# Patient Record
Sex: Male | Born: 1965
Health system: Southern US, Community
[De-identification: ages and names within clinical notes are randomized; demographics above are authoritative.]

## PROBLEM LIST (undated history)

## (undated) DIAGNOSIS — I10 Essential (primary) hypertension: Secondary | ICD-10-CM

## (undated) DIAGNOSIS — I2510 Atherosclerotic heart disease of native coronary artery without angina pectoris: Secondary | ICD-10-CM

## (undated) DIAGNOSIS — E785 Hyperlipidemia, unspecified: Secondary | ICD-10-CM

## (undated) DIAGNOSIS — G4730 Sleep apnea, unspecified: Secondary | ICD-10-CM

## (undated) DIAGNOSIS — E119 Type 2 diabetes mellitus without complications: Secondary | ICD-10-CM

## (undated) HISTORY — DX: Essential (primary) hypertension: I10

## (undated) HISTORY — PX: CARDIAC CATHETERIZATION: SHX172

## (undated) HISTORY — DX: Type 2 diabetes mellitus without complications: E11.9

---

## 1999-11-29 ENCOUNTER — Ambulatory Visit: Admission: RE | Admit: 1999-11-29 | Discharge: 1999-11-29 | Payer: Self-pay | Admitting: *Deleted

## 2000-05-29 ENCOUNTER — Ambulatory Visit (HOSPITAL_BASED_OUTPATIENT_CLINIC_OR_DEPARTMENT_OTHER): Admission: RE | Admit: 2000-05-29 | Discharge: 2000-05-29 | Payer: Self-pay | Admitting: Pulmonary Disease

## 2013-01-05 ENCOUNTER — Encounter (HOSPITAL_COMMUNITY): Payer: Self-pay | Admitting: *Deleted

## 2013-01-05 ENCOUNTER — Emergency Department (INDEPENDENT_AMBULATORY_CARE_PROVIDER_SITE_OTHER)
Admission: EM | Admit: 2013-01-05 | Discharge: 2013-01-05 | Disposition: A | Discharge status: Home / Self Care | Payer: Self-pay | Source: Home / Self Care | Attending: Emergency Medicine | Admitting: Emergency Medicine

## 2013-01-05 ENCOUNTER — Emergency Department (INDEPENDENT_AMBULATORY_CARE_PROVIDER_SITE_OTHER): Payer: Self-pay

## 2013-01-05 DIAGNOSIS — M6530 Trigger finger, unspecified finger: Secondary | ICD-10-CM

## 2013-01-05 DIAGNOSIS — M65311 Trigger thumb, right thumb: Secondary | ICD-10-CM

## 2013-01-05 MED ORDER — METHOCARBAMOL 500 MG PO TABS: 500.0000 mg | ORAL_TABLET | Freq: Two times a day (BID) | ORAL | Status: DC

## 2013-01-05 MED ORDER — IBUPROFEN 800 MG PO TABS: 800.0000 mg | ORAL_TABLET | Freq: Three times a day (TID) | ORAL | Status: DC

## 2013-01-05 NOTE — ED Provider Notes (Signed)
Medical screening examination/treatment/procedure(s) were performed by non-physician practitioner and as supervising physician I was immediately available for consultation/collaboration.  Emanuelle Bastos   Sumiko [name], MD 01/05/13 1225 
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
## 2013-01-05 NOTE — ED Notes (Signed)
r thumb  Splint in pof
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
## 2013-03-15 ENCOUNTER — Emergency Department (HOSPITAL_BASED_OUTPATIENT_CLINIC_OR_DEPARTMENT_OTHER): Payer: Self-pay

## 2013-03-15 ENCOUNTER — Inpatient Hospital Stay (HOSPITAL_BASED_OUTPATIENT_CLINIC_OR_DEPARTMENT_OTHER)
Admission: EM | Admit: 2013-03-15 | Discharge: 2013-03-25 | DRG: 637 | Disposition: A | Discharge status: Home / Self Care | Payer: MEDICAID | Attending: Internal Medicine | Admitting: Internal Medicine

## 2013-03-15 ENCOUNTER — Encounter (HOSPITAL_BASED_OUTPATIENT_CLINIC_OR_DEPARTMENT_OTHER): Payer: Self-pay | Admitting: *Deleted

## 2013-03-15 DIAGNOSIS — R197 Diarrhea, unspecified: Secondary | ICD-10-CM | POA: Diagnosis present

## 2013-03-15 DIAGNOSIS — E1110 Type 2 diabetes mellitus with ketoacidosis without coma: Secondary | ICD-10-CM | POA: Diagnosis present

## 2013-03-15 DIAGNOSIS — R7309 Other abnormal glucose: Secondary | ICD-10-CM

## 2013-03-15 DIAGNOSIS — B372 Candidiasis of skin and nail: Secondary | ICD-10-CM | POA: Diagnosis present

## 2013-03-15 DIAGNOSIS — K047 Periapical abscess without sinus: Secondary | ICD-10-CM | POA: Diagnosis present

## 2013-03-15 DIAGNOSIS — G4733 Obstructive sleep apnea (adult) (pediatric): Secondary | ICD-10-CM | POA: Diagnosis present

## 2013-03-15 DIAGNOSIS — E46 Unspecified protein-calorie malnutrition: Secondary | ICD-10-CM | POA: Diagnosis present

## 2013-03-15 DIAGNOSIS — H538 Other visual disturbances: Secondary | ICD-10-CM | POA: Diagnosis present

## 2013-03-15 DIAGNOSIS — J189 Pneumonia, unspecified organism: Secondary | ICD-10-CM | POA: Diagnosis present

## 2013-03-15 DIAGNOSIS — R739 Hyperglycemia, unspecified: Secondary | ICD-10-CM

## 2013-03-15 DIAGNOSIS — E1010 Type 1 diabetes mellitus with ketoacidosis without coma: Principal | ICD-10-CM | POA: Diagnosis present

## 2013-03-15 DIAGNOSIS — R319 Hematuria, unspecified: Secondary | ICD-10-CM

## 2013-03-15 DIAGNOSIS — Z794 Long term (current) use of insulin: Secondary | ICD-10-CM

## 2013-03-15 DIAGNOSIS — G9340 Encephalopathy, unspecified: Secondary | ICD-10-CM | POA: Diagnosis present

## 2013-03-15 DIAGNOSIS — R310 Gross hematuria: Secondary | ICD-10-CM | POA: Diagnosis present

## 2013-03-15 DIAGNOSIS — E860 Dehydration: Secondary | ICD-10-CM | POA: Diagnosis present

## 2013-03-15 DIAGNOSIS — E119 Type 2 diabetes mellitus without complications: Secondary | ICD-10-CM

## 2013-03-15 DIAGNOSIS — E876 Hypokalemia: Secondary | ICD-10-CM

## 2013-03-15 HISTORY — DX: Sleep apnea, unspecified: G47.30

## 2013-03-15 LAB — BASIC METABOLIC PANEL
CO2: <7 mEq/L — CL (ref 19–32)
Chloride: 78 mEq/L — ABNORMAL LOW (ref 96–112)
GFR calc Af Amer: 75 mL/min — ABNORMAL LOW (ref >90)
Potassium: 4.6 mEq/L (ref 3.5–5.1)

## 2013-03-15 LAB — CBC WITH DIFFERENTIAL/PLATELET
Basophils Absolute: 0 10*3/uL (ref 0.0–0.1)
Basophils Relative: 0 % (ref 0–1)
Eosinophils Absolute: 0 10*3/uL (ref 0.0–0.7)
HCT: 44.7 % (ref 39.0–52.0)
Hemoglobin: 15.5 g/dL (ref 13.0–17.0)
Lymphocytes Relative: 6 % — ABNORMAL LOW (ref 12–46)
MCHC: 34.7 g/dL (ref 30.0–36.0)
Monocytes Relative: 9 % (ref 3–12)
Neutrophils Relative %: 85 % — ABNORMAL HIGH (ref 43–77)
RDW: 12.9 % (ref 11.5–15.5)
WBC: 27.8 10*3/uL — ABNORMAL HIGH (ref 4.0–10.5)

## 2013-03-15 LAB — POCT I-STAT 3, VENOUS BLOOD GAS (G3P V)
Acid-base deficit: 25 mmol/L — ABNORMAL HIGH (ref 0.0–2.0)
pCO2, Ven: 22.6 mmHg — ABNORMAL LOW (ref 45.0–50.0)
pH, Ven: 6.992 — CL (ref 7.250–7.300)
pO2, Ven: 25 mmHg — CL (ref 30.0–45.0)

## 2013-03-15 LAB — URINALYSIS, ROUTINE W REFLEX MICROSCOPIC
Glucose, UA: >1000 mg/dL — AB
Leukocytes, UA: NEGATIVE
Nitrite: NEGATIVE
Specific Gravity, Urine: 1.025 (ref 1.005–1.030)
pH: 5 (ref 5.0–8.0)

## 2013-03-15 LAB — URINE MICROSCOPIC-ADD ON

## 2013-03-15 LAB — GLUCOSE, CAPILLARY: Glucose-Capillary: >600 mg/dL (ref 70–99)

## 2013-03-15 MED ORDER — MORPHINE SULFATE 4 MG/ML IJ SOLN
4.0000 mg | Freq: Once | INTRAMUSCULAR | Status: AC
  Administered 2013-03-15: 4 mg via INTRAVENOUS

## 2013-03-15 MED ORDER — SODIUM CHLORIDE 0.9 % IV BOLUS (SEPSIS)
1000.0000 mL | Freq: Once | INTRAVENOUS | Status: AC
  Administered 2013-03-15: 1000 mL via INTRAVENOUS

## 2013-03-15 MED ORDER — MORPHINE SULFATE 4 MG/ML IJ SOLN
INTRAMUSCULAR | Status: AC
  Filled 2013-03-15: qty 1

## 2013-03-15 MED ORDER — SODIUM CHLORIDE 0.9 % IV SOLN: INTRAVENOUS | Status: DC

## 2013-03-15 MED ORDER — SODIUM CHLORIDE 0.9 % IV SOLN
1000.0000 mL | Freq: Once | INTRAVENOUS | Status: AC
  Administered 2013-03-15: 1000 mL via INTRAVENOUS

## 2013-03-15 MED ORDER — ONDANSETRON HCL 4 MG/2ML IJ SOLN
4.0000 mg | Freq: Once | INTRAMUSCULAR | Status: AC
  Administered 2013-03-15: 4 mg via INTRAVENOUS
  Filled 2013-03-15: qty 2

## 2013-03-15 MED ORDER — INSULIN REGULAR HUMAN 100 UNIT/ML IJ SOLN
INTRAMUSCULAR | Status: AC
  Filled 2013-03-15: qty 1

## 2013-03-15 MED ORDER — SODIUM CHLORIDE 0.9 % IV SOLN: 1000.0000 mL | INTRAVENOUS | Status: DC

## 2013-03-15 NOTE — ED Provider Notes (Signed)
History    CSN: [id number] Arrival date & time 03/15/13  1924  First MD Initiated Contact with Patient 03/15/13 1945     Chief Complaint  Patient presents with  . Shortness of Breath   (Consider location/radiation/quality/duration/timing/severity/associated sxs/prior Treatment) HPI Comments: 47 y.o. Male with PMHx of sleep apnea presents today complaining of SOB, discoloration to lower extremities bilaterally, confusion, and generalized weakness. Onset: s/p root canal procedure on Thursday. Pt was given Clinda and hydrocodone for pain. Pt states he didn't feel well taking it, found himself tired, weak, having difficulty concentrating. So he stopped taking it as of Sunday. Today pt awoke to find himself short of breath and went to the urgent care where he was given a breathing treatment and a steroid. Pt states it did not help. He went home, continued feeling short of breath so decided to come to the ED for evaluation.    CBG at bedside was > 600.  Patient is a 47 y.o. male presenting with shortness of breath.  Shortness of Breath Severity:  Severe Onset quality:  Gradual Timing:  Constant Progression:  Worsening Chronicity:  New Context comment:  S/p taking antibiotics and pain meds for root canal Relieved by:  Nothing Worsened by:  Nothing tried Ineffective treatments:  None tried Associated symptoms: no chest pain, no diaphoresis, no fever, no headaches, no neck pain, no sore throat, no syncope and no vomiting   Associated symptoms comment:  Mottling of bilateral lower extremities  Past Medical History  Diagnosis Date  . Sleep apnea    History reviewed. No pertinent past surgical history. History reviewed. No pertinent family history. History  Substance Use Topics  . Smoking status: Never Smoker   . Smokeless tobacco: Not on file  . Alcohol Use: Yes    Review of Systems  Constitutional: Positive for chills. Negative for fever and diaphoresis.  HENT: Positive for  dental problem. Negative for sore throat, facial swelling, drooling, trouble swallowing, neck pain, neck stiffness and voice change.   Eyes: Negative for photophobia and visual disturbance.  Respiratory: Positive for shortness of breath.   Cardiovascular: Negative for chest pain and syncope.  Gastrointestinal: Negative for nausea and vomiting.  Endocrine: Positive for polydipsia.  Musculoskeletal: Negative for back pain.  Skin: Positive for color change.       Mottling of bilateral lower extremities  Neurological: Negative for weakness, light-headedness, numbness and headaches.  Psychiatric/Behavioral: The patient is not nervous/anxious.     Allergies  Review of patient's allergies indicates no known allergies.  Home Medications   Current Outpatient Rx  Name  Route  Sig  Dispense  Refill  . ibuprofen (ADVIL,MOTRIN) 800 MG tablet   Oral   Take 1 tablet (800 mg total) by mouth 3 (three) times daily.   21 tablet   0   . methocarbamol (ROBAXIN) 500 MG tablet   Oral   Take 1 tablet (500 mg total) by mouth 2 (two) times daily.   20 tablet   0    BP 119/71  Pulse 75  Resp 16  Ht 5\' 8"  (1.727 m)  Wt 170 lb (77.111 kg)  BMI 25.85 kg/m2  SpO2 100% Physical Exam  Nursing note and vitals reviewed. Constitutional: He is oriented to person, place, and time. No distress.  Sick looking  HENT:  Head: Normocephalic and atraumatic.  Eyes: Conjunctivae and EOM are normal.  Neck: Normal range of motion. Neck supple.  No meningeal signs  Cardiovascular: Normal rate, regular rhythm, normal  heart sounds and intact distal pulses.  Exam reveals no gallop and no friction rub.   No murmur heard. Pulmonary/Chest: Breath sounds normal. No respiratory distress. He has no wheezes. He has no rales. He exhibits no tenderness.  Kussmaul respirations, tachypnic to low 20s  Abdominal: Soft. Bowel sounds are normal. He exhibits no distension. There is no tenderness. There is no rebound and no  guarding.  Musculoskeletal: Normal range of motion. He exhibits no edema and no tenderness.  FROM to upper and lower extremities  Neurological: He is alert and oriented to person, place, and time. No cranial nerve deficit.  Speech is clear and goal oriented, follows commands Sensation normal to light touch and two point discrimination Moves extremities without ataxia, coordination intact Normal gait and balance Normal strength in upper and lower extremities bilaterally including dorsiflexion and plantar flexion, strong and equal grip strength   Skin: Skin is dry. He is not diaphoretic. No erythema.  Cool to touch. Mottling on bilateral lower extremities.   Psychiatric:  anxious    ED Course  Procedures (including critical care time)   Date: 03/15/2013  Rate: 76  Rhythm: normal sinus rhythm  QRS Axis: normal  Intervals: normal  ST/T Wave abnormalities: normal  Conduction Disutrbances:low voltage  Narrative Interpretation: abnormal EKG  Old EKG Reviewed: none available   Labs Reviewed  GLUCOSE, CAPILLARY - Abnormal; Notable for the following:    Glucose-Capillary >600 (*)    All other components within normal limits  CBC WITH DIFFERENTIAL - Abnormal; Notable for the following:    WBC 27.8 (*)    Neutrophils Relative % 85 (*)    Lymphocytes Relative 6 (*)    Neutro Abs 23.6 (*)    Monocytes Absolute 2.5 (*)    All other components within normal limits  BASIC METABOLIC PANEL - Abnormal; Notable for the following:    Sodium 108 (*)    Chloride 78 (*)    CO2 <7 (*)    Glucose, Bld 1146 (*)    BUN 45 (*)    GFR calc non Af Amer 64 (*)    GFR calc Af Amer 75 (*)    All other components within normal limits  POCT I-STAT 3, BLOOD GAS (G3P V) - Abnormal; Notable for the following:    pH, Ven 6.992 (*)    pCO2, Ven 22.6 (*)    pO2, Ven 25.0 (*)    Bicarbonate 5.8 (*)    Acid-base deficit 25.0 (*)    All other components within normal limits  URINALYSIS, ROUTINE W REFLEX  MICROSCOPIC   No results found. 1. DKA (diabetic ketoacidoses)     MDM  Pt in severe DKA with new onset DM s/p dental work. Glucose 1146. PH 6.992. Bicarb 5.8. Sodium 108. Chloride 78. Anion Gap 24.2. WBC 27.8. BUN 45. Rectal temp 91.5, after warming blankets  93.4. Dr. Anitra Lauth notified immediately. Will arrange to have pt transported to ICU.   Glade Nurse, PA-C 03/15/13 2235  Glade Nurse, PA-C 03/15/13 [phone]

## 2013-03-15 NOTE — ED Notes (Signed)
Pt c/o SOB, rash to legs , weakness , seen at UC this am for same ? Allergic reaction to meds

## 2013-03-15 NOTE — H&P (Signed)
PULMONARY  / CRITICAL CARE MEDICINE  Name: Johnny Nichols MRN: [id number] DOB: 06/27/1966    ADMISSION DATE:  03/15/2013 CONSULTATION DATE:  03/15/2013  REFERRING MD :  EDP [facility] PRIMARY SERVICE:  PCCM  CHIEF COMPLAINT:  DKA  BRIEF PATIENT DESCRIPTION: 47 yo with past medical history of OSA brought to [facility] ED with complaints of weakness and confusion. Reports root canal procedure 5 days prior to presentation for after which he was prescribed Clindamycin and Oxycodone.  Antibiotics completed 2 days prior to presentation.  Today he presented to urgent care with dyspnea for which he was treated with bronchodilator and steroid and discharged.  In ED hyperglycemic, acidotic, hypothermic.  PCCM was consulted and patient was transferred to [facility] for further management.  SIGNIFICANT EVENTS / STUDIES:   LINES / TUBES:  CULTURES: 7/15 Blood >>> 7/14 Urine >>>  ANTIBIOTICS: Zosyn 7/15 >>>  The patient is encephalopathic and unable to provide history, which was obtained for available medical records.  HISTORY OF PRESENT ILLNESS:  47 yo with past medical history of OSA brought to [facility] ED with complaints of weakness and confusion. Reports root canal procedure 5 days prior to presentation for after which he was prescribed Clindamycin and Oxycodone.  Antibiotics completed 2 days prior to presentation.  Today he presented to urgent care with dyspnea for which he was treated with bronchodilator and steroid and discharged.  In ED hyperglycemic, acidotic, hypothermic.  PCCM was consulted and patient was transferred to [facility] for further management.  PAST MEDICAL HISTORY :  Past Medical History  Diagnosis Date  . Sleep apnea    History reviewed. No pertinent past surgical history. Prior to Admission medications   Medication Sig Start Date End Date Taking? Authorizing Provider  ibuprofen (ADVIL,MOTRIN) 800 MG tablet Take 1 tablet (800 mg total) by mouth 3 (three) times daily. 01/05/13   Elson Areas, PA-C   methocarbamol (ROBAXIN) 500 MG tablet Take 1 tablet (500 mg total) by mouth 2 (two) times daily. 01/05/13   Elson Areas, PA-C   No Known Allergies  FAMILY HISTORY:  History reviewed. No pertinent family history.  SOCIAL HISTORY:  reports that he has never smoked. He does not have any smokeless tobacco history on file. He reports that  drinks alcohol. His drug history is not on file.  REVIEW OF SYSTEMS:  Unable to provide.  INTERVAL HISTORY:  VITAL SIGNS: Temp:  [91.5 F (33.1 C)-94.6 F (34.8 C)] 94.6 F (34.8 C) (07/15 2355) Pulse Rate:  [75-97] 97 (07/16 0000) Resp:  [15-24] 19 (07/16 0000) BP: (119-145)/(71-98) 121/80 mmHg (07/16 0000) SpO2:  [98 %-100 %] 98 % (07/16 0000) Weight:  [77.111 kg (170 lb)-86.3 kg (190 lb 4.1 oz)] 86.3 kg (190 lb 4.1 oz) (07/15 2345)  HEMODYNAMICS:   VENTILATOR SETTINGS:   INTAKE / OUTPUT: Intake/Output     07/15 0701 - 07/16 0700   I.V. (mL/kg) 122.3 (1.4)   Total Intake(mL/kg) 122.3 (1.4)   Net +122.3         PHYSICAL EXAMINATION: General:  Appears acutely ill, no distress Neuro:  Encephalopathic, responds appropriately but slow, nonfocal HEENT:  PERRL, very dry oral mucosa, could not identify any focal oral lesions Cardiovascular:  RRR, no m/r/g Lungs:  Bilateral diminished air entry, no w/r/r Abdomen:  Soft, nontender, bowel sounds diminished Musculoskeletal:  Moves all extremities, no edema Skin:  Intact, no rash  LABS:  Recent Labs Lab 03/15/13 2023  HGB 15.5  WBC 27.8*  PLT 217  NA  108*  K 4.6  CL 78*  CO2 <7*  GLUCOSE 1146*  BUN 45*  CREATININE 1.30  CALCIUM 9.1    Recent Labs Lab 03/15/13 1945 03/15/13 2221  GLUCAP >600* >600*   CXR:  7/15 >>> nad  ASSESSMENT / PLAN:  PULMONARY A:  OSA. P:   Gaol SpO2>92 Supplemental oxygen PRN CPAP?   CARDIOVASCULAR A: Hemodynamically stable.  No arrhythmia / ischemia. P:  Goal MAP>60 Trend troponin / lactate  RENAL A:  Metabolic acidosis.  Pseudohyponatremia (corrected Na = 133).  Dehydration / hypovolemia. P:   Trend BMP IVF per DKA protocol  GASTROINTESTINAL A:  Abdominal pain / nausea, likely secondary to DKA P:   NPO GI Px is not indicated Amylase, lipase, LFTs  HEMATOLOGIC A:  Hemoconcentration. P:  Trend CBC Heparin for DVT Px  INFECTIOUS A:  No overt infection. Dental abscess?  Leukocytosis possible to DKA / steroids. P:   Cultures and antibiotics as above PCT Consider maxillofacial imaging in AM   ENDOCRINE  A:  New onset DM.  DKA is setting of infection? systemic steroids? P:   DKA protocol  NEUROLOGIC A:  Acute encephalopathy in setting of severe acidosis / hyperglycemia.  P:   Goal RASS 0 to -1 Drug screen Hold Robaxin  TODAY'S SUMMARY: New onset DM.  DKA / severe hyperglycemia in setting of acute infection (dental?) and systemic steroids.  DKA protocol. Empirical Zosyn. May need maxillofacial imaging in AM.   I have personally obtained a history, examined the patient, evaluated laboratory and imaging results, formulated the assessment and plan and placed orders.  CRITICAL CARE:  The patient is critically ill with multiple organ systems failure and requires high complexity decision making for assessment and support, frequent evaluation and titration of therapies, application of advanced monitoring technologies and extensive interpretation of multiple databases. Critical Care Time devoted to patient care services described in this note is 45 minutes.   Lonia Farber, MD Pulmonary and Critical Care Medicine [facility] Pager: [phone]  03/16/2013, 12:12 AM

## 2013-03-15 NOTE — ED Notes (Signed)
Report called to [facility] at [facility] ICU

## 2013-03-16 ENCOUNTER — Encounter (HOSPITAL_COMMUNITY): Payer: Self-pay | Admitting: *Deleted

## 2013-03-16 ENCOUNTER — Inpatient Hospital Stay (HOSPITAL_COMMUNITY): Payer: Self-pay

## 2013-03-16 DIAGNOSIS — R739 Hyperglycemia, unspecified: Secondary | ICD-10-CM | POA: Insufficient documentation

## 2013-03-16 DIAGNOSIS — E119 Type 2 diabetes mellitus without complications: Secondary | ICD-10-CM | POA: Diagnosis present

## 2013-03-16 DIAGNOSIS — G9340 Encephalopathy, unspecified: Secondary | ICD-10-CM | POA: Diagnosis present

## 2013-03-16 DIAGNOSIS — G4733 Obstructive sleep apnea (adult) (pediatric): Secondary | ICD-10-CM | POA: Diagnosis present

## 2013-03-16 DIAGNOSIS — E1110 Type 2 diabetes mellitus with ketoacidosis without coma: Secondary | ICD-10-CM | POA: Diagnosis present

## 2013-03-16 DIAGNOSIS — K047 Periapical abscess without sinus: Secondary | ICD-10-CM | POA: Diagnosis present

## 2013-03-16 LAB — GLUCOSE, CAPILLARY
Glucose-Capillary: 235 mg/dL — ABNORMAL HIGH (ref 70–99)
Glucose-Capillary: 283 mg/dL — ABNORMAL HIGH (ref 70–99)
Glucose-Capillary: 361 mg/dL — ABNORMAL HIGH (ref 70–99)
Glucose-Capillary: 391 mg/dL — ABNORMAL HIGH (ref 70–99)
Glucose-Capillary: 445 mg/dL — ABNORMAL HIGH (ref 70–99)
Glucose-Capillary: 494 mg/dL — ABNORMAL HIGH (ref 70–99)
Glucose-Capillary: >600 mg/dL (ref 70–99)

## 2013-03-16 LAB — BASIC METABOLIC PANEL
BUN: 38 mg/dL — ABNORMAL HIGH (ref 6–23)
BUN: 34 mg/dL — ABNORMAL HIGH (ref 6–23)
BUN: 32 mg/dL — ABNORMAL HIGH (ref 6–23)
BUN: 19 mg/dL (ref 6–23)
CO2: 10 mEq/L — CL (ref 19–32)
CO2: 13 mEq/L — ABNORMAL LOW (ref 19–32)
CO2: <7 mEq/L — CL (ref 19–32)
Calcium: 8.7 mg/dL (ref 8.4–10.5)
Calcium: 8.5 mg/dL (ref 8.4–10.5)
Calcium: 8.7 mg/dL (ref 8.4–10.5)
Calcium: 8.7 mg/dL (ref 8.4–10.5)
Calcium: 8.9 mg/dL (ref 8.4–10.5)
Chloride: 102 mEq/L (ref 96–112)
Chloride: 104 mEq/L (ref 96–112)
Chloride: 104 mEq/L (ref 96–112)
Creatinine, Ser: 0.99 mg/dL (ref 0.50–1.35)
Creatinine, Ser: 0.87 mg/dL (ref 0.50–1.35)
Creatinine, Ser: 0.89 mg/dL (ref 0.50–1.35)
GFR calc Af Amer: >90 mL/min (ref >90)
GFR calc Af Amer: >90 mL/min (ref >90)
GFR calc Af Amer: >90 mL/min (ref >90)
GFR calc Af Amer: >90 mL/min (ref >90)
GFR calc Af Amer: >90 mL/min (ref >90)
GFR calc non Af Amer: >90 mL/min (ref >90)
GFR calc non Af Amer: >90 mL/min (ref >90)
GFR calc non Af Amer: >90 mL/min (ref >90)
Glucose, Bld: 608 mg/dL (ref 70–99)
Glucose, Bld: 428 mg/dL — ABNORMAL HIGH (ref 70–99)
Glucose, Bld: 195 mg/dL — ABNORMAL HIGH (ref 70–99)
Potassium: 3.4 mEq/L — ABNORMAL LOW (ref 3.5–5.1)
Potassium: 3.4 mEq/L — ABNORMAL LOW (ref 3.5–5.1)
Potassium: 3.9 mEq/L (ref 3.5–5.1)
Potassium: 4 mEq/L (ref 3.5–5.1)
Sodium: 125 mEq/L — ABNORMAL LOW (ref 135–145)
Sodium: 127 mEq/L — ABNORMAL LOW (ref 135–145)
Sodium: 124 mEq/L — ABNORMAL LOW (ref 135–145)
Sodium: 126 mEq/L — ABNORMAL LOW (ref 135–145)

## 2013-03-16 LAB — HEPATIC FUNCTION PANEL
ALT: 6 U/L (ref 0–53)
Total Protein: 5.4 g/dL — ABNORMAL LOW (ref 6.0–8.3)

## 2013-03-16 LAB — PHOSPHORUS: Phosphorus: 1.8 mg/dL — ABNORMAL LOW (ref 2.3–4.6)

## 2013-03-16 LAB — CBC
Hemoglobin: 16.3 g/dL (ref 13.0–17.0)
MCH: 28.8 pg (ref 26.0–34.0)
MCHC: 37 g/dL — ABNORMAL HIGH (ref 30.0–36.0)
RDW: 12.2 % (ref 11.5–15.5)

## 2013-03-16 LAB — RAPID URINE DRUG SCREEN, HOSP PERFORMED: Benzodiazepines: NOT DETECTED

## 2013-03-16 LAB — ETHANOL: Alcohol, Ethyl (B): <11 mg/dL (ref 0–11)

## 2013-03-16 LAB — LACTIC ACID, PLASMA
Lactic Acid, Venous: 1.8 mmol/L (ref 0.5–2.2)
Lactic Acid, Venous: 1.8 mmol/L (ref 0.5–2.2)

## 2013-03-16 LAB — LIPASE, BLOOD: Lipase: 1530 U/L — ABNORMAL HIGH (ref 11–59)

## 2013-03-16 LAB — PROCALCITONIN: Procalcitonin: 1 ng/mL

## 2013-03-16 MED ORDER — POTASSIUM CHLORIDE 10 MEQ/100ML IV SOLN
INTRAVENOUS | Status: AC
  Administered 2013-03-16: 10 meq
  Filled 2013-03-16: qty 200

## 2013-03-16 MED ORDER — PIPERACILLIN-TAZOBACTAM 3.375 G IVPB
3.3750 g | Freq: Three times a day (TID) | INTRAVENOUS | Status: DC
  Administered 2013-03-16 – 2013-03-19 (×12): 3.375 g via INTRAVENOUS
  Filled 2013-03-16 (×17): qty 50

## 2013-03-16 MED ORDER — SODIUM CHLORIDE 0.9 % IV SOLN
INTRAVENOUS | Status: AC
  Administered 2013-03-16 (×2): via INTRAVENOUS

## 2013-03-16 MED ORDER — BIOTENE DRY MOUTH MT LIQD: 15.0000 mL | Freq: Two times a day (BID) | OROMUCOSAL | Status: DC

## 2013-03-16 MED ORDER — POTASSIUM CHLORIDE 10 MEQ/100ML IV SOLN
10.0000 meq | INTRAVENOUS | Status: AC
  Administered 2013-03-16 (×2): 10 meq via INTRAVENOUS

## 2013-03-16 MED ORDER — POTASSIUM CHLORIDE 10 MEQ/100ML IV SOLN
10.0000 meq | INTRAVENOUS | Status: AC
  Administered 2013-03-17 (×2): 10 meq via INTRAVENOUS
  Filled 2013-03-16: qty 200

## 2013-03-16 MED ORDER — SODIUM CHLORIDE 0.9 % IV SOLN
INTRAVENOUS | Status: DC
  Administered 2013-03-16: 02:00:00 via INTRAVENOUS

## 2013-03-16 MED ORDER — HEPARIN SODIUM (PORCINE) 5000 UNIT/ML IJ SOLN
5000.0000 [IU] | Freq: Three times a day (TID) | INTRAMUSCULAR | Status: DC
  Administered 2013-03-16 – 2013-03-19 (×12): 5000 [IU] via SUBCUTANEOUS
  Filled 2013-03-16 (×14): qty 1

## 2013-03-16 MED ORDER — POTASSIUM CHLORIDE 10 MEQ/100ML IV SOLN
INTRAVENOUS | Status: AC
  Administered 2013-03-16: 10 meq via INTRAVENOUS
  Filled 2013-03-16: qty 400

## 2013-03-16 MED ORDER — BIOTENE DRY MOUTH MT LIQD
15.0000 mL | Freq: Two times a day (BID) | OROMUCOSAL | Status: DC
  Administered 2013-03-16 – 2013-03-24 (×13): 15 mL via OROMUCOSAL

## 2013-03-16 MED ORDER — DEXTROSE-NACL 5-0.45 % IV SOLN
INTRAVENOUS | Status: DC
  Administered 2013-03-16: 100 mL/h via INTRAVENOUS

## 2013-03-16 MED ORDER — POTASSIUM CHLORIDE 10 MEQ/100ML IV SOLN
INTRAVENOUS | Status: AC
  Filled 2013-03-16: qty 200

## 2013-03-16 MED ORDER — ONDANSETRON HCL 4 MG/2ML IJ SOLN
4.0000 mg | Freq: Four times a day (QID) | INTRAMUSCULAR | Status: DC | PRN
  Administered 2013-03-16 – 2013-03-17 (×2): 4 mg via INTRAVENOUS
  Filled 2013-03-16 (×2): qty 2

## 2013-03-16 MED ORDER — PANTOPRAZOLE SODIUM 40 MG IV SOLR
40.0000 mg | INTRAVENOUS | Status: DC
  Administered 2013-03-16 – 2013-03-17 (×2): 40 mg via INTRAVENOUS
  Filled 2013-03-16 (×4): qty 40

## 2013-03-16 MED ORDER — POTASSIUM CHLORIDE 10 MEQ/100ML IV SOLN
INTRAVENOUS | Status: AC
  Filled 2013-03-16: qty 400

## 2013-03-16 MED ORDER — POTASSIUM CHLORIDE 10 MEQ/100ML IV SOLN
10.0000 meq | INTRAVENOUS | Status: AC
  Administered 2013-03-16 (×4): 10 meq via INTRAVENOUS

## 2013-03-16 MED ORDER — DEXTROSE 50 % IV SOLN: 25.0000 mL | INTRAVENOUS | Status: DC | PRN

## 2013-03-16 MED ORDER — MORPHINE SULFATE 2 MG/ML IJ SOLN
2.0000 mg | INTRAMUSCULAR | Status: DC | PRN
  Administered 2013-03-16: 2 mg via INTRAVENOUS
  Filled 2013-03-16: qty 1

## 2013-03-16 MED ORDER — POTASSIUM CHLORIDE 10 MEQ/100ML IV SOLN
10.0000 meq | INTRAVENOUS | Status: AC
  Administered 2013-03-16 (×3): 10 meq via INTRAVENOUS

## 2013-03-16 MED ORDER — SODIUM CHLORIDE 0.9 % IV SOLN
INTRAVENOUS | Status: DC
  Administered 2013-03-16: 12.2 [IU]/h via INTRAVENOUS
  Administered 2013-03-17: 04:00:00 via INTRAVENOUS
  Filled 2013-03-16 (×4): qty 1

## 2013-03-16 MED ORDER — KCL IN DEXTROSE-NACL 40-5-0.45 MEQ/L-%-% IV SOLN
INTRAVENOUS | Status: DC
  Administered 2013-03-16 – 2013-03-17 (×3): via INTRAVENOUS
  Filled 2013-03-16 (×5): qty 1000

## 2013-03-16 MED ORDER — POTASSIUM PHOSPHATE DIBASIC 3 MMOLE/ML IV SOLN
24.0000 mmol | Freq: Once | INTRAVENOUS | Status: AC
  Administered 2013-03-16: 24 mmol via INTRAVENOUS
  Filled 2013-03-16: qty 8

## 2013-03-16 NOTE — Progress Notes (Signed)
CRITICAL VALUE ALERT  Critical value received:  CO2 8   Date of notification:  03/16/2013  Time of notification:  0341  Critical value read back:yes  Nurse who received alert:  Marthann Schiller RN, Holland Falling RN   MD notified (1st page):  Dr. Marchelle Gearing   Time of first page:  [phone]  MD notified (2nd page):  Time of second page:  Responding MD:  Dr. Marchelle Gearing   Time MD responded:  [phone]

## 2013-03-16 NOTE — Progress Notes (Signed)
CRITICAL VALUE ALERT  Critical value received: co2 10  Date of notification:  03/16/2013  Time of notification:  0900  Critical value read back:yes  Nurse who received alert:  Acey Lav   MD notified (1st page):  simond  Time of first page:  1015  MD notified (2nd page):  Time of second page:  Responding MD:  simond  Time MD responded:  1015

## 2013-03-16 NOTE — Progress Notes (Signed)
INITIAL NUTRITION ASSESSMENT  DOCUMENTATION CODES Per approved criteria  -Not Applicable   INTERVENTION: Diet advancement per team to goal of Carbohydrate Modified Medium diet. RD to continue to follow nutrition care plan and assess need for oral nutrition supplements and/or diet education.  NUTRITION DIAGNOSIS: Inadequate oral intake related to inability to eat as evidenced by NPO status.   Goal: Advance diet as tolerated; Intake to meet >90% of estimated nutrition needs.  Monitor:  weight trends, lab trends, I/O's, diet advancement, need for education/oral nutrition supplements  Reason for Assessment: Malnutrition Screening Tool  47 y.o. male  Admitting Dx: DKA  ASSESSMENT: Admitted with SOB, discoloration to BLE, confusion, and generalized weakness. S/p root canal last week. Work-up reveals new onset DM and severe DKA in setting of acute infection (?dental) and systemic steroids.  Pt reports intentional weight loss PTA. He stated that at the beginning of this year he weighed close to 250 lb. Mostly because of dietary changes, he is now down to 190 lb. This is a weight change of 24% and is significant. Pt somewhat lethargic - unable to perform dietary recall at this time. Denies poor appetite. Does state that he wasn't able to eat well s/p root canal.  Phosphorus 1.8 on admission, currently ordered for potassium phosphate. Potassium now WNL, recently repleted.  Pt with abdominal pain and nausea likely 2/2 DKA. Remains NPO at this time. Pt is at nutrition risk 2/2 current medical issues and recent profound weight loss.  Height: Ht Readings from Last 1 Encounters:  03/15/13 5\' 8"  (1.727 m)    Weight: Wt Readings from Last 1 Encounters:  03/15/13 190 lb 4.1 oz (86.3 kg)    Ideal Body Weight: 154 lb  % Ideal Body Weight: 123%  Wt Readings from Last 10 Encounters:  03/15/13 190 lb 4.1 oz (86.3 kg)    Usual Body Weight: 250 lb (per pt) in Jan 2014  % Usual Body  Weight: 76%  BMI:  Body mass index is 28.94 kg/(m^2). Overweight.  Estimated Nutritional Needs: Kcal: 1800 - 2000 Protein: 85 - 95 g Fluid: 1.8 - 2 liters  Skin: intact  Diet Order: NPO  EDUCATION NEEDS: -Education not appropriate at this time   Intake/Output Summary (Last 24 hours) at 03/16/13 1000 Last data filed at 03/16/13 0900  Gross per 24 hour  Intake 3738.88 ml  Output   1800 ml  Net 1938.88 ml    Last BM: 7/15  Labs:   Recent Labs Lab 03/16/13 0002 03/16/13 0230 03/16/13 0755  NA 120* 123* 125*  K 3.6 3.4* 3.5  CL 95* 101 103  CO2 <7* 8* 10*  BUN 38* 34* 29*  CREATININE 0.99 0.87 0.87  CALCIUM 8.7 8.5 8.7  MG  --  2.0  --   PHOS  --  1.8*  --   GLUCOSE 608* 428* 271*    CBG (last 3)   Recent Labs  03/16/13 0517 03/16/13 0602 03/16/13 0645  GLUCAP 294* 283* 235*    Scheduled Meds: . antiseptic oral rinse  15 mL Mouth Rinse q12n4p  . heparin  5,000 Units Subcutaneous Q8H  . morphine      . piperacillin-tazobactam (ZOSYN)  IV  3.375 g Intravenous Q8H  . potassium chloride  10 mEq Intravenous Q1 Hr x 2  . potassium chloride      . potassium phosphate IVPB (mmol)  24 mmol Intravenous Once    Continuous Infusions: . sodium chloride 150 mL/hr at 03/16/13 0215  .  dextrose 5 % and 0.45% NaCl 100 mL/hr (03/16/13 0714)  . insulin (NOVOLIN-R) infusion 11.2 mL/hr at 03/16/13 0600    Past Medical History  Diagnosis Date  . Sleep apnea     History reviewed. No pertinent past surgical history.  Jarold Motto MS, RD, LDN Pager: [phone] After-hours pager: [phone]

## 2013-03-16 NOTE — Progress Notes (Signed)
eLink Physician-Brief Progress Note Patient Name: Johnny Nichols DOB: [birth year] MRN: [id number]  Date of Service  03/16/2013   HPI/Events of Note   Pt has his cpap from home  eICU Interventions  Ordered home cpap    Intervention Category Major Interventions: Other:  Shan Levans 03/16/2013, 9:58 PM

## 2013-03-16 NOTE — Progress Notes (Signed)
CRITICAL VALUE ALERT  Critical value received:  co2 10  Date of notification:  03/16/2013  Time of notification:  1820  Critical value read back:yes  Nurse who received alert:  Acey Lav  MD notified (1st page):  Dr Delford Field in [facility]  Time of first page:  1626  MD notified (2nd page):  Time of second page:  Responding MD:  Dr Delford Field in [facility]  Time MD responded:  [phone]

## 2013-03-16 NOTE — ED Provider Notes (Signed)
Medical screening examination/treatment/procedure(s) were conducted as a shared visit with non-physician practitioner(s) and myself.  I personally evaluated the patient during the encounter CRITICAL CARE Performed by: Gwyneth Sprout Total critical care time: 45 Critical care time was exclusive of separately billable procedures and treating other patients. Critical care was necessary to treat or prevent imminent or life-threatening deterioration. Critical care was time spent personally by me on the following activities: development of treatment plan with patient and/or surrogate as well as nursing, discussions with consultants, evaluation of patient's response to treatment, examination of patient, obtaining history from patient or surrogate, ordering and performing treatments and interventions, ordering and review of laboratory studies, ordering and review of radiographic studies, pulse oximetry and re-evaluation of patient's condition.   patient presented due to shortness of breath and not feeling well. Upon entering the room he smells of ketones and appears to be in DKA. Patient is awake and alert and is hemodynamically stable at this time. He is hypothermic which improves with warm fluids and bearhugger.  Labs indicate DKA with a pH of 6.9 and a bicarbonate of 5. Blood sugar at 1148 and sodium of 107. Normal creatinine and potassium is normal. Patient started on a glucose stabilizer after 4 L of fluid and admitted to ICU  Gwyneth Sprout, MD 03/16/13 0002

## 2013-03-16 NOTE — Progress Notes (Signed)
Dr Marchelle Gearing treated K 3.4 and Phos 1.8.

## 2013-03-16 NOTE — Progress Notes (Signed)
eLink Physician-Brief Progress Note Patient Name: Johnny Nichols DOB: 01/10/1966 MRN: [id number]  Date of Service  03/16/2013   HPI/Events of Note   Phos 1.8 cretat 0.8  eICU Interventions  24 mmol k phos   Intervention Category Intermediate Interventions: Electrolyte abnormality - evaluation and management  Torian Thoennes 03/16/2013, 3:48 AM

## 2013-03-16 NOTE — Progress Notes (Signed)
PULMONARY  / CRITICAL CARE MEDICINE  Name: Johnny Nichols MRN: [id number] DOB: [birth year]    ADMISSION DATE:  03/15/2013 CONSULTATION DATE:  03/15/2013  REFERRING MD :  [facility] PRIMARY SERVICE:  PCCM  CHIEF COMPLAINT:  DKA  BRIEF PATIENT DESCRIPTION: 21 M with history of OSA brought to [facility] ED with weakness, dyspnea, confusion. Found to be hyperglycemic, acidotic, hypothermic.  Reports root canal procedure 5 days prior to presentation for after which he was prescribed Clindamycin and Oxycodone.  Antibiotics completed 2 days prior to presentation.PCCM was consulted and patient was transferred to [facility] for further management of DKA. No prior hx of DM  SIGNIFICANT EVENTS / STUDIES:   LINES / TUBES:  CULTURES: 7/15 Blood >>> 7/14 Urine >>>  ANTIBIOTICS: Zosyn 7/15 >>>   SUBJ: Awake, alert, oriented, no distress. C/o abdominal pain   VITAL SIGNS: Temp:  [91.5 F (33.1 C)-98.1 F (36.7 C)] 97.4 F (36.3 C) (07/16 1141) Pulse Rate:  [75-100] 100 (07/16 1400) Resp:  [15-24] 19 (07/16 1400) BP: (115-171)/(63-144) 138/83 mmHg (07/16 1400) SpO2:  [97 %-100 %] 99 % (07/16 1400) Weight:  [77.111 kg (170 lb)-86.3 kg (190 lb 4.1 oz)] 86.3 kg (190 lb 4.1 oz) (07/15 2345)  HEMODYNAMICS:   VENTILATOR SETTINGS:   INTAKE / OUTPUT: Intake/Output     07/15 0701 - 07/16 0700 07/16 0701 - 07/17 0700   P.O.  180   I.V. (mL/kg) 2572.7 (29.8) 781.1 (9.1)   IV Piggyback 570 605   Total Intake(mL/kg) 3142.7 (36.4) 1566.1 (18.1)   Urine (mL/kg/hr) 1100 700 (1.1)   Total Output 1100 700   Net +2042.7 +866.1          PHYSICAL EXAMINATION: General: NAD Neuro: RASS 0, + F/C, no focal deficits HEENT: WNL Cardiovascular:  RRR s M Lungs: Clear Abdomen:  Soft, nontender, diminished BS, mildly tender throughout Ext: no edema   LABS: BMET    Component Value Date/Time   NA 127* 03/16/2013 1225   K 3.4* 03/16/2013 1225   CL 103 03/16/2013 1225   CO2 12* 03/16/2013 1225   GLUCOSE 195*  03/16/2013 1225   BUN 24* 03/16/2013 1225   CREATININE 0.80 03/16/2013 1225   CALCIUM 8.9 03/16/2013 1225   GFRNONAA >90 03/16/2013 1225   GFRAA >90 03/16/2013 1225    CBC    Component Value Date/Time   WBC 23.6* 03/16/2013 0002   RBC 5.66 03/16/2013 0002   HGB 16.3 03/16/2013 0002   HCT 44.1 03/16/2013 0002   PLT 271 03/16/2013 0002   MCV 77.9* 03/16/2013 0002   MCH 28.8 03/16/2013 0002   MCHC 37.0* 03/16/2013 0002   RDW 12.2 03/16/2013 0002   LYMPHSABS 1.7 03/15/2013 2023   MONOABS 2.5* 03/15/2013 2023   EOSABS 0.0 03/15/2013 2023   BASOSABS 0.0 03/15/2013 2023      Recent Labs Lab 03/16/13 0309 03/16/13 0408 03/16/13 0517 03/16/13 0602 03/16/13 0645  GLUCAP 445* 361* 294* 283* 235*   Amylase 881 Lipase 1530   CXR:  NNF  ASSESSMENT / PLAN:  PULMONARY A:  H/O OSA. P:   Monitor with oximetry Consider CPAP if desats noted  CARDIOVASCULAR A: Hemodynamically stable.  No arrhythmia / ischemia. P:  Tele monitoring  RENAL A:  Metabolic acidosis. Pseudohyponatremia.  volume depletion P:   Trend BMP IVF per DKA protocol  GASTROINTESTINAL A:  Abdominal pain / nausea, likely secondary to DKA Elevated amylase and lipase, possible mild pancreatitis P:   Clear liquids PPI ordered due to  nonspecific abdominal pain KUB ordered 7/16 Follow exam   HEMATOLOGIC A:  Hemoconcentration. P:  Trend CBC Heparin for DVT Px  INFECTIOUS A:  Leukocytosis Mild elevation of PCT No overt site of infection P:   Cultures and antibiotics as above   ENDOCRINE  A:  New onset DM.  DKA is setting of infection? systemic steroids? P:   Cont DKA protocol  NEUROLOGIC A:  Acute encephalopathy, resolved P:   monitor  TODAY'S SUMMARY:  I have personally obtained a history, examined the patient, evaluated laboratory and imaging results, formulated the assessment and plan and placed orders.    Billy Fischer, MD Pulmonary and Critical Care Medicine [facility] Pager: [phone]  03/16/2013, 2:31 PM

## 2013-03-16 NOTE — Progress Notes (Signed)
CRITICAL VALUE ALERT  Critical value received:  CO2 <7, Glu 608  Date of notification:  03/16/2013  Time of notification:  0113  Critical value read back:yes  Nurse who received alert:  Marthann Schiller RN, Holland Falling RN  MD notified (1st page):  Dr. Marchelle Gearing   Time of first page:  0150  MD notified (2nd page):  Time of second page:  Responding MD:  Dr. Marchelle Gearing  Time MD responded:  [phone]

## 2013-03-17 ENCOUNTER — Inpatient Hospital Stay (HOSPITAL_COMMUNITY): Payer: MEDICAID

## 2013-03-17 LAB — GLUCOSE, CAPILLARY
Glucose-Capillary: 107 mg/dL — ABNORMAL HIGH (ref 70–99)
Glucose-Capillary: 107 mg/dL — ABNORMAL HIGH (ref 70–99)
Glucose-Capillary: 108 mg/dL — ABNORMAL HIGH (ref 70–99)
Glucose-Capillary: 110 mg/dL — ABNORMAL HIGH (ref 70–99)
Glucose-Capillary: 112 mg/dL — ABNORMAL HIGH (ref 70–99)
Glucose-Capillary: 117 mg/dL — ABNORMAL HIGH (ref 70–99)
Glucose-Capillary: 127 mg/dL — ABNORMAL HIGH (ref 70–99)
Glucose-Capillary: 133 mg/dL — ABNORMAL HIGH (ref 70–99)
Glucose-Capillary: 162 mg/dL — ABNORMAL HIGH (ref 70–99)
Glucose-Capillary: 170 mg/dL — ABNORMAL HIGH (ref 70–99)
Glucose-Capillary: 178 mg/dL — ABNORMAL HIGH (ref 70–99)
Glucose-Capillary: 191 mg/dL — ABNORMAL HIGH (ref 70–99)
Glucose-Capillary: 89 mg/dL (ref 70–99)
Glucose-Capillary: 93 mg/dL (ref 70–99)
Glucose-Capillary: 97 mg/dL (ref 70–99)

## 2013-03-17 LAB — CBC
MCH: 28.8 pg (ref 26.0–34.0)
MCV: 77.9 fL — ABNORMAL LOW (ref 78.0–100.0)
Platelets: 172 10*3/uL (ref 150–400)
RBC: 4.48 MIL/uL (ref 4.22–5.81)
RDW: 13 % (ref 11.5–15.5)
WBC: 13.1 10*3/uL — ABNORMAL HIGH (ref 4.0–10.5)

## 2013-03-17 LAB — PROCALCITONIN: Procalcitonin: 0.29 ng/mL

## 2013-03-17 LAB — BASIC METABOLIC PANEL
BUN: 10 mg/dL (ref 6–23)
CO2: 14 mEq/L — ABNORMAL LOW (ref 19–32)
CO2: 14 mEq/L — ABNORMAL LOW (ref 19–32)
Calcium: 8.7 mg/dL (ref 8.4–10.5)
Calcium: 8.7 mg/dL (ref 8.4–10.5)
Chloride: 107 mEq/L (ref 96–112)
Creatinine, Ser: 0.62 mg/dL (ref 0.50–1.35)
Creatinine, Ser: 0.62 mg/dL (ref 0.50–1.35)
GFR calc non Af Amer: >90 mL/min (ref >90)
GFR calc non Af Amer: >90 mL/min (ref >90)
GFR calc non Af Amer: >90 mL/min (ref >90)
Glucose, Bld: 100 mg/dL — ABNORMAL HIGH (ref 70–99)
Glucose, Bld: 119 mg/dL — ABNORMAL HIGH (ref 70–99)
Glucose, Bld: 219 mg/dL — ABNORMAL HIGH (ref 70–99)
Potassium: 3.7 mEq/L (ref 3.5–5.1)
Sodium: 130 mEq/L — ABNORMAL LOW (ref 135–145)
Sodium: 130 mEq/L — ABNORMAL LOW (ref 135–145)

## 2013-03-17 LAB — URINE CULTURE

## 2013-03-17 MED ORDER — INSULIN GLARGINE 100 UNIT/ML ~~LOC~~ SOLN
30.0000 [IU] | SUBCUTANEOUS | Status: DC
  Administered 2013-03-17: 30 [IU] via SUBCUTANEOUS
  Filled 2013-03-17 (×2): qty 0.3

## 2013-03-17 MED ORDER — POTASSIUM CHLORIDE 10 MEQ/100ML IV SOLN
10.0000 meq | INTRAVENOUS | Status: AC
  Administered 2013-03-17 (×2): 10 meq via INTRAVENOUS
  Filled 2013-03-17: qty 200

## 2013-03-17 MED ORDER — SODIUM CHLORIDE 0.9 % IV SOLN
INTRAVENOUS | Status: DC
  Administered 2013-03-17 – 2013-03-18 (×3): via INTRAVENOUS

## 2013-03-17 MED ORDER — BD GETTING STARTED TAKE HOME KIT: 3/10ML X 30G SYRINGES
1.0000 | Freq: Once | Status: DC
  Filled 2013-03-17: qty 1

## 2013-03-17 MED ORDER — LIVING WELL WITH DIABETES BOOK
Freq: Once | Status: DC
  Filled 2013-03-17: qty 1

## 2013-03-17 MED ORDER — INSULIN ASPART 100 UNIT/ML ~~LOC~~ SOLN
2.0000 [IU] | SUBCUTANEOUS | Status: DC
  Administered 2013-03-17 (×2): 6 [IU] via SUBCUTANEOUS
  Administered 2013-03-18 (×2): 4 [IU] via SUBCUTANEOUS

## 2013-03-17 MED ORDER — BD GETTING STARTED TAKE HOME KIT: 1/2ML X 30G SYRINGES
1.0000 | Freq: Once | Status: DC
  Filled 2013-03-17 (×2): qty 1

## 2013-03-17 MED ORDER — INSULIN GLARGINE 100 UNIT/ML ~~LOC~~ SOLN
40.0000 [IU] | SUBCUTANEOUS | Status: DC
  Filled 2013-03-17: qty 0.4

## 2013-03-17 NOTE — Progress Notes (Signed)
Inpatient Diabetes Program Recommendations  AACE/ADA: New Consensus Statement on Inpatient Glycemic Control (2013)  Target Ranges:  Prepandial:   less than 140 mg/dL      Peak postprandial:   less than 180 mg/dL (1-2 hours)      Critically ill patients:  140 - 180 mg/dL   Reason for Visit: Note new onset diabetes.  Spoke at length to patient and his girlfriend Johnny Nichols.  He reports that he has been having symptoms of hyperglycemia for the past 1 month including thirst, thrush, and frequent urination.  Discussed type 2 diabetes with patient and basic physiology of DKA.  Discussed likely need for insulin at discharge and injection techniques.  Discussed hypoglycemia and proper treatment using 15:15 rule.  Will need reinforcement on all teaching as patient is still lethargic.  Girlfriend at bedside and seemed supportive to patient.  Patient has appointment at [facility] clinic and patient qualifies for [facility] program.  Discussed education plan with patient including need for insulin teaching, diabetes videos, "Living Well with Diabetes" and dietician consult.   Will have Diabetes Coordinator follow-up on 03/18/13.

## 2013-03-17 NOTE — Progress Notes (Signed)
PULMONARY  / CRITICAL CARE MEDICINE  Name: Johnny Nichols MRN: [id number] DOB: February 07, 1966    ADMISSION DATE:  03/15/2013 CONSULTATION DATE:  03/15/2013  REFERRING MD :  [facility] PRIMARY SERVICE:  PCCM  CHIEF COMPLAINT:  DKA  BRIEF PATIENT DESCRIPTION: 54 M with history of OSA brought to [facility] ED with weakness, dyspnea, confusion. Found to be hyperglycemic 1150, acidotic, hypothermic.  Reports root canal procedure 5 days prior to presentation for after which he was prescribed Clindamycin and Oxycodone.  Antibiotics completed 2 days prior to presentation.PCCM was consulted and patient was transferred to [facility] for further management of DKA. No prior hx of DM  SIGNIFICANT EVENTS / STUDIES:   LINES / TUBES:  CULTURES: 7/15 Blood >>> 7/14 Urine >>>ng  ANTIBIOTICS: Zosyn 7/15 >>>   SUBJ: Awake, alert, oriented, no distress. C/o abdominal pain, oob to chair   VITAL SIGNS: Temp:  [97.4 F (36.3 C)-99.1 F (37.3 C)] 99.1 F (37.3 C) (07/17 0433) Pulse Rate:  [92-107] 97 (07/17 0600) Resp:  [16-27] 19 (07/17 0600) BP: (123-171)/(69-144) 162/88 mmHg (07/17 0600) SpO2:  [98 %-100 %] 100 % (07/17 0600)  HEMODYNAMICS:   VENTILATOR SETTINGS:   INTAKE / OUTPUT: Intake/Output     07/16 0701 - 07/17 0700 07/17 0701 - 07/18 0700   P.O. 520    I.V. (mL/kg) 2386.1 (27.6)    IV Piggyback 1355    Total Intake(mL/kg) 4261.1 (49.4)    Urine (mL/kg/hr) 1400 (0.7)    Total Output 1400     Net +2861.1          Urine Occurrence 2200 x 500 x   Emesis Occurrence 2 x      PHYSICAL EXAMINATION: General: NAD Neuro: RASS 0, + F/C, no focal deficits HEENT: WNL Cardiovascular:  RRR s M Lungs: Clear Abdomen:  Soft, nontender, diminished BS, mildly tender throughout Ext: no edema   LABS: BMET    Component Value Date/Time   NA 130* 03/17/2013 0530   K 3.6 03/17/2013 0530   CL 107 03/17/2013 0530   CO2 14* 03/17/2013 0530   GLUCOSE 100* 03/17/2013 0530   BUN 11 03/17/2013 0530   CREATININE  0.62 03/17/2013 0530   CALCIUM 8.7 03/17/2013 0530   GFRNONAA >90 03/17/2013 0530   GFRAA >90 03/17/2013 0530    CBC    Component Value Date/Time   WBC 13.1* 03/17/2013 0530   RBC 4.48 03/17/2013 0530   HGB 12.9* 03/17/2013 0530   HCT 34.9* 03/17/2013 0530   PLT 172 03/17/2013 0530   MCV 77.9* 03/17/2013 0530   MCH 28.8 03/17/2013 0530   MCHC 37.0* 03/17/2013 0530   RDW 13.0 03/17/2013 0530   LYMPHSABS 1.7 03/15/2013 2023   MONOABS 2.5* 03/15/2013 2023   EOSABS 0.0 03/15/2013 2023   BASOSABS 0.0 03/15/2013 2023      Recent Labs Lab 03/17/13 0501 03/17/13 0558 03/17/13 0700 03/17/13 0757 03/17/13 0904  GLUCAP 93 89 127* 107* 112*   Amylase 881 Lipase 1530   CXR:  NNF  ASSESSMENT / PLAN:  PULMONARY A:  H/O OSA. P:   Monitor with oximetry Noct CPAP  CARDIOVASCULAR A: Hemodynamically stable.  No arrhythmia / ischemia. P:  Tele monitoring  RENAL A:  Metabolic acidosis. Pseudohyponatremia.  volume depletion P:   Trend BMP Change IVfs to NS -avoid hyperchloremia  GASTROINTESTINAL A:  Abdominal pain / nausea, likely secondary to DKA Elevated amylase and lipase, possible mild pancreatitis P:   Clear liquids -advance to diabetic diet if  lipase down PPI ordered due to nonspecific abdominal pain KUB ordered 7/16 Follow exam   HEMATOLOGIC A:  Hemoconcentration. P:  Trend CBC Heparin for DVT Px  INFECTIOUS A:  Leukocytosis Mild elevation of PCT No overt site of infection P:   Cultures and antibiotics as above   ENDOCRINE  A:  New onset DM.  DKA is setting of infection? systemic steroids? P:   Give 30 u lantus & transition off Diabetic teaching Unclear if he will need long term insulin  NEUROLOGIC A:  Acute encephalopathy, resolved P:   monitor  TODAY'S SUMMARY: Off insulin drip & advance PO  I have personally obtained a history, examined the patient, evaluated laboratory and imaging results, formulated the assessment and plan and placed  orders.    Oretha Milch., MD Pulmonary and Critical Care Medicine [facility] Pager: [phone]  03/17/2013, 9:31 AM

## 2013-03-17 NOTE — Care Management Note (Signed)
    Page 1 of 1   03/17/2013     11:27:33 AM   CARE MANAGEMENT NOTE 03/17/2013  Patient:  Johnny Nichols, Johnny Nichols   Account Number:  [id number]  Date Initiated:  03/16/2013  Documentation initiated by:  Alvira Philips Assessment:   47 yr-old male adm with dx of new onset DM, DKA; lives with SO, independent PTA     In-house referral  Artist      DC Planning Services  CM consult  MATCH Program  Medication Assistance      Status of service:  In process, will continue to follow  Per UR Regulation:  Reviewed for med. necessity/level of care/duration of stay  Comments:  ContactTheora Gianotti, [place]  #[phone]  03/17/13 1100 Verdis Prime RN MSN BSN CCM Appt arranged for Thursday, July 24th @ 1:00 p.m. with Dr Laural Benes.  03/16/13 1040 Finnley Larusso RN MSN BSN CCM Pt works two jobs but has no insurance per [employer].  Discussed new dx, need for PCP.  Provided information re [facility], will contact for appt per request.  Pt will qualify for [facility] Program when discharged, pharmaceutical company patient assistance program applications can be provided when medications are determined.

## 2013-03-17 NOTE — Progress Notes (Signed)
Found pt wearing his home CPAP unit this morning.  Pt has removed his CPAP mask now.  No RT needs noted. RN working w/ pt presently.

## 2013-03-17 NOTE — Plan of Care (Addendum)
Problem: Food- and Nutrition-Related Knowledge Deficit (NB-1.1) Goal: Nutrition education Formal process to instruct or train a patient/client in a skill or to impart knowledge to help patients/clients voluntarily manage or modify food choices and eating behavior to maintain or improve health. Outcome: Completed/Met Date Met:  03/17/13  RD consulted for nutrition education regarding diabetes.   CBG (last 3)   Recent Labs   03/17/13 1007 03/17/13 1057 03/17/13 1202  GLUCAP 125* 130* 112*    RD provided "Carbohydrate Counting for People with Diabetes" handout from the Academy of Nutrition and Dietetics. Discussed different food groups and their effects on blood sugar, emphasizing carbohydrate-containing foods. Provided list of carbohydrates and recommended serving sizes of common foods.  Discussed importance of controlled and consistent carbohydrate intake throughout the day. Provided examples of ways to balance meals/snacks and encouraged intake of high-fiber, whole grain complex carbohydrates. Teach back method used.  Expect good compliance.  Patient with no questions at end of visitation.  Please re-consult RD as needed.  Maureen Chatters, RD, LDN Pager #: [phone] After-Hours Pager #: [phone]

## 2013-03-18 DIAGNOSIS — E860 Dehydration: Secondary | ICD-10-CM | POA: Diagnosis present

## 2013-03-18 DIAGNOSIS — E876 Hypokalemia: Secondary | ICD-10-CM | POA: Diagnosis present

## 2013-03-18 LAB — BASIC METABOLIC PANEL
BUN: 7 mg/dL (ref 6–23)
CO2: 15 mEq/L — ABNORMAL LOW (ref 19–32)
Chloride: 103 mEq/L (ref 96–112)
Chloride: 102 mEq/L (ref 96–112)
GFR calc Af Amer: >90 mL/min (ref >90)
GFR calc non Af Amer: >90 mL/min (ref >90)
GFR calc non Af Amer: >90 mL/min (ref >90)
Glucose, Bld: 175 mg/dL — ABNORMAL HIGH (ref 70–99)
Potassium: 3 mEq/L — ABNORMAL LOW (ref 3.5–5.1)
Potassium: 3.1 mEq/L — ABNORMAL LOW (ref 3.5–5.1)
Sodium: 134 mEq/L — ABNORMAL LOW (ref 135–145)

## 2013-03-18 LAB — GLUCOSE, CAPILLARY
Glucose-Capillary: 204 mg/dL — ABNORMAL HIGH (ref 70–99)
Glucose-Capillary: 184 mg/dL — ABNORMAL HIGH (ref 70–99)
Glucose-Capillary: 184 mg/dL — ABNORMAL HIGH (ref 70–99)
Glucose-Capillary: 250 mg/dL — ABNORMAL HIGH (ref 70–99)
Glucose-Capillary: 226 mg/dL — ABNORMAL HIGH (ref 70–99)
Glucose-Capillary: 225 mg/dL — ABNORMAL HIGH (ref 70–99)
Glucose-Capillary: 189 mg/dL — ABNORMAL HIGH (ref 70–99)

## 2013-03-18 MED ORDER — PANTOPRAZOLE SODIUM 40 MG PO TBEC
40.0000 mg | DELAYED_RELEASE_TABLET | Freq: Every day | ORAL | Status: DC
  Administered 2013-03-18 – 2013-03-25 (×8): 40 mg via ORAL
  Filled 2013-03-18 (×5): qty 1

## 2013-03-18 MED ORDER — INSULIN ASPART 100 UNIT/ML ~~LOC~~ SOLN: 2.0000 [IU] | Freq: Three times a day (TID) | SUBCUTANEOUS | Status: DC

## 2013-03-18 MED ORDER — INSULIN ASPART 100 UNIT/ML ~~LOC~~ SOLN
0.0000 [IU] | Freq: Three times a day (TID) | SUBCUTANEOUS | Status: DC
  Administered 2013-03-18 – 2013-03-19 (×4): 5 [IU] via SUBCUTANEOUS
  Administered 2013-03-19: 8 [IU] via SUBCUTANEOUS

## 2013-03-18 MED ORDER — METFORMIN HCL 500 MG PO TABS
500.0000 mg | ORAL_TABLET | Freq: Two times a day (BID) | ORAL | Status: DC
  Administered 2013-03-18 – 2013-03-25 (×15): 500 mg via ORAL
  Filled 2013-03-18 (×17): qty 1

## 2013-03-18 MED ORDER — INSULIN ASPART PROT & ASPART (70-30 MIX) 100 UNIT/ML ~~LOC~~ SUSP
10.0000 [IU] | Freq: Two times a day (BID) | SUBCUTANEOUS | Status: DC
  Administered 2013-03-18 – 2013-03-19 (×3): 10 [IU] via SUBCUTANEOUS
  Filled 2013-03-18: qty 10

## 2013-03-18 MED ORDER — INSULIN ASPART 100 UNIT/ML ~~LOC~~ SOLN
0.0000 [IU] | Freq: Every day | SUBCUTANEOUS | Status: DC
  Administered 2013-03-19: 2 [IU] via SUBCUTANEOUS

## 2013-03-18 NOTE — Progress Notes (Signed)
Pt. Has his home CPAP set up at the bedside. Pt. Stated that he would place himself on CPAP before going to bed. Pt. Was made aware to call RT if he needed assistance with CPAP.

## 2013-03-18 NOTE — Progress Notes (Signed)
[facility] Progress Note [place] TEAM 1 - Stepdown/ICU TEAM   Benjimen Kelley UJW:[id number] DOB: Apr 20, 1966 DOA: 03/15/2013 PCP: Provider Not In System  Brief narrative: 73 M with history of OSA brought to [facility] ED with weakness, dyspnea, confusion. Found to be hyperglycemic 1150, acidotic, hypothermic. Reports root canal procedure 5 days prior to presentation for after which he was prescribed Clindamycin and Oxycodone. Antibiotics completed 2 days prior to presentation.PCCM was consulted and patient was transferred to [facility] for further management of DKA. No prior hx of DM   Assessment/Plan: Active Problems:   Diabetes mellitus, new onset/DKA (diabetic ketoacidoses) -still has mild AG elevation but will continue SQ insulin and diet/follow BMET q 12hrs -HgbA1c ws 14.4 so will need insulin initially- 70/30 started -will also start low dose Metformin and titrate up as OP with plans to transtion to this med alone -extensive education in process re: injections and glucometer use -financially will need inexpensive meds OP since pays OOP -CM following re: DC planning -Check lipids specifically triglycerides     Acute encephalopathy -due to DKA/resolved    Dehydration -BUN still up, Na better and tachycardic-pt c/o dry mouth -IVF's    Hypokalemia -replete    OSA (obstructive sleep apnea) -using home CPAP    Dental abscess/recent triple root canal -likely precipitating event for DKA -Continue Zosyn-was on clindamycin pre admit  -suspect that leukocytosis at presentation is related to DKA/low perfusion and dehydration and not truly infectious   DVT prophylaxis: SCDs Code Status: Full Family Communication: Patient Disposition Plan: Transfer to floor Isolation: None Nutritional Status: Acute protein calorie malnutrition related to new diagnosis of diabetes and recent DKA  Consultants: None  Procedures: None  Antibiotics: Zosyn  HPI/Subjective: Patient alert and  somewhat subdued a bit anxious about his new diagnosis. Multiple questions answered. Relieved that we potentially could send him home in the next 24 hours to 48 hours if he is clinically ready but does request additional intensive teaching. Has not yet given himself any injections and it looks as if he will be going home on insulin.   Objective: Blood pressure 131/69, pulse 102, temperature 99.2 F (37.3 C), temperature source Oral, resp. rate 22, height 5\' 8"  (1.727 m), weight 86 kg (189 lb 9.5 oz), SpO2 98.00%.  Intake/Output Summary (Last 24 hours) at 03/18/13 1318 Last data filed at 03/18/13 1059  Gross per 24 hour  Intake   1230 ml  Output   3475 ml  Net  -2245 ml     Exam: General: No acute respiratory distress Lungs: Clear to auscultation bilaterally without wheezes or crackles, RA Cardiovascular: Regular rate/mildly tachycardic and rhythm without murmur gallop or rub normal S1 and S2, no peripheral edema or JVD Abdomen: Nontender, nondistended, soft, bowel sounds positive, no rebound, no ascites, no appreciable mass Musculoskeletal: No significant cyanosis, clubbing of bilateral lower extremities Neurological: Alert and oriented x 3, moves all extremities x 4 without focal neurological deficits, CN 2-12 intact  Scheduled Meds: Scheduled Meds: . antiseptic oral rinse  15 mL Mouth Rinse q12n4p  . bd getting started take home kit  1 kit Other Once  . heparin  5,000 Units Subcutaneous Q8H  . insulin aspart  0-15 Units Subcutaneous TID WC  . insulin aspart  0-5 Units Subcutaneous QHS  . insulin aspart protamine- aspart  10 Units Subcutaneous BID WC  . living well with diabetes book   Does not apply Once  . metFORMIN  500 mg Oral BID WC  . pantoprazole  40 mg Oral Daily  . piperacillin-tazobactam (ZOSYN)  IV  3.375 g Intravenous Q8H   Continuous Infusions: . sodium chloride 150 mL/hr at 03/18/13 1007    **Reviewed in detail by the Attending Physician   Data  Reviewed: Basic Metabolic Panel:  Recent Labs Lab 03/16/13 0002 03/16/13 0230  03/16/13 2240 03/17/13 0530 03/17/13 0846 03/17/13 2000 03/18/13 0716  NA 120* 123*  < > 126* 130* 130* 130* 133*  K 3.6 3.4*  < > 4.0 3.6 3.7 3.3* 3.0*  CL 95* 101  < > 104 107 107 103 103  CO2 <7* 8*  < > 13* 14* 15* 14* 15*  GLUCOSE 608* 428*  < > 133* 100* 119* 219* 175*  BUN 38* 34*  < > 16 11 10 10 7   CREATININE 0.99 0.87  < > 0.69 0.62 0.62 0.62 0.60  CALCIUM 8.7 8.5  < > 8.7 8.7 8.8 8.7 8.7  MG  --  2.0  --   --   --   --   --   --   PHOS  --  1.8*  --   --   --   --   --   --   < > = values in this interval not displayed. Liver Function Tests:  Recent Labs Lab 03/16/13 0230  AST 11  ALT 6  ALKPHOS 91  BILITOT 0.1*  PROT 5.4*  ALBUMIN 2.0*    Recent Labs Lab 03/16/13 0230 03/17/13 0530 03/18/13 0716  LIPASE 1530* 463* 72*  AMYLASE 881*  --   --    No results found for this basename: AMMONIA,  in the last 168 hours CBC:  Recent Labs Lab 03/15/13 2023 03/16/13 0002 03/17/13 0530  WBC 27.8* 23.6* 13.1*  NEUTROABS 23.6*  --   --   HGB 15.5 16.3 12.9*  HCT 44.7 44.1 34.9*  MCV 84.5 77.9* 77.9*  PLT 217 271 172   Cardiac Enzymes:  Recent Labs Lab 03/16/13 0030 03/16/13 0755  TROPONINI <0.30 <0.30   BNP (last 3 results) No results found for this basename: PROBNP,  in the last 8760 hours CBG:  Recent Labs Lab 03/17/13 1947 03/17/13 2339 03/18/13 0336 03/18/13 0743 03/18/13 1208  GLUCAP 204* 178* 184* 184* 250*    Recent Results (from the past 240 hour(s))  MRSA PCR SCREENING     Status: None   Collection Time    03/15/13 11:47 PM      Result Value Range Status   MRSA by PCR NEGATIVE  NEGATIVE Final   Comment:            The GeneXpert MRSA Assay (FDA     approved for NASAL specimens     only), is one component of a     comprehensive MRSA colonization     surveillance program. It is not     intended to diagnose MRSA     infection nor to guide or      monitor treatment for     MRSA infections.  CULTURE, BLOOD (ROUTINE X 2)     Status: None   Collection Time    03/16/13 12:11 AM      Result Value Range Status   Specimen Description BLOOD RIGHT ARM   Final   Special Requests BOTTLES DRAWN AEROBIC AND ANAEROBIC 10CC [facility]   Final   Culture  Setup Time 03/16/2013 09:26   Final   Culture     Final   Value:  BLOOD CULTURE RECEIVED NO GROWTH TO DATE CULTURE WILL BE HELD FOR 5 DAYS BEFORE ISSUING A FINAL NEGATIVE REPORT   Report Status PENDING   Incomplete  CULTURE, BLOOD (ROUTINE X 2)     Status: None   Collection Time    03/16/13 12:30 AM      Result Value Range Status   Specimen Description BLOOD RIGHT HAND   Final   Special Requests BOTTLES DRAWN AEROBIC ONLY 10CC   Final   Culture  Setup Time 03/16/2013 09:26   Final   Culture     Final   Value:        BLOOD CULTURE RECEIVED NO GROWTH TO DATE CULTURE WILL BE HELD FOR 5 DAYS BEFORE ISSUING A FINAL NEGATIVE REPORT   Report Status PENDING   Incomplete  URINE CULTURE     Status: None   Collection Time    03/16/13  2:19 AM      Result Value Range Status   Specimen Description URINE, CLEAN CATCH   Final   Special Requests NONE   Final   Culture  Setup Time 03/16/2013 03:34   Final   Colony Count NO GROWTH   Final   Culture NO GROWTH   Final   Report Status 03/17/2013 FINAL   Final     Studies:  Recent x-ray studies have been reviewed in detail by the Attending Physician   patient seen and examined with nurse practitioner And agree with the assessment and plan Darrick Grinder, ANP [facility] Office  [phone] Pager [phone]  **If unable to reach the above provider after paging please contact the Flow Manager @ [phone]  On-Call/Text Page:      [email or web address]      password [email or web address]  If 7PM-7AM, please contact night-coverage [email or web address] Password [email or web address] 03/18/2013, 1:18 PM   LOS: 3 days

## 2013-03-18 NOTE — Plan of Care (Signed)
Problem: Consults Goal: Diabetic Ketoacidosis (DKA) Patient Education See Patient Education Modules for education specifics.  Outcome: Progressing Living with diabetes booklet given to patient to read; pt instructed to watch diabetes videos on the television

## 2013-03-19 DIAGNOSIS — E876 Hypokalemia: Secondary | ICD-10-CM

## 2013-03-19 LAB — BASIC METABOLIC PANEL
BUN: 6 mg/dL (ref 6–23)
BUN: 5 mg/dL — ABNORMAL LOW (ref 6–23)
CO2: 13 mEq/L — ABNORMAL LOW (ref 19–32)
Calcium: 9 mg/dL (ref 8.4–10.5)
Calcium: 9.4 mg/dL (ref 8.4–10.5)
Chloride: 102 mEq/L (ref 96–112)
Chloride: 101 mEq/L (ref 96–112)
Creatinine, Ser: 0.68 mg/dL (ref 0.50–1.35)
Creatinine, Ser: 0.6 mg/dL (ref 0.50–1.35)
GFR calc Af Amer: >90 mL/min (ref >90)

## 2013-03-19 LAB — GLUCOSE, CAPILLARY
Glucose-Capillary: 282 mg/dL — ABNORMAL HIGH (ref 70–99)
Glucose-Capillary: 228 mg/dL — ABNORMAL HIGH (ref 70–99)
Glucose-Capillary: 211 mg/dL — ABNORMAL HIGH (ref 70–99)

## 2013-03-19 LAB — LIPID PANEL
LDL Cholesterol: 81 mg/dL (ref 0–99)
Total CHOL/HDL Ratio: 4.8 RATIO

## 2013-03-19 LAB — MAGNESIUM: Magnesium: 2 mg/dL (ref 1.5–2.5)

## 2013-03-19 MED ORDER — INSULIN NPH (HUMAN) (ISOPHANE) 100 UNIT/ML ~~LOC~~ SUSP
5.0000 [IU] | Freq: Once | SUBCUTANEOUS | Status: DC
  Filled 2013-03-19: qty 10

## 2013-03-19 MED ORDER — AMOXICILLIN-POT CLAVULANATE 875-125 MG PO TABS
1.0000 | ORAL_TABLET | Freq: Two times a day (BID) | ORAL | Status: DC
  Administered 2013-03-19 – 2013-03-23 (×8): 1 via ORAL
  Filled 2013-03-19 (×9): qty 1

## 2013-03-19 MED ORDER — POTASSIUM CHLORIDE CRYS ER 20 MEQ PO TBCR
40.0000 meq | EXTENDED_RELEASE_TABLET | Freq: Once | ORAL | Status: AC
  Administered 2013-03-19: 40 meq via ORAL
  Filled 2013-03-19: qty 2

## 2013-03-19 MED ORDER — NYSTATIN 100000 UNIT/GM EX CREA
TOPICAL_CREAM | Freq: Two times a day (BID) | CUTANEOUS | Status: DC
  Administered 2013-03-19 – 2013-03-25 (×13): via TOPICAL
  Filled 2013-03-19: qty 15

## 2013-03-19 MED ORDER — ENOXAPARIN SODIUM 40 MG/0.4ML ~~LOC~~ SOLN
40.0000 mg | SUBCUTANEOUS | Status: DC
  Administered 2013-03-20 – 2013-03-22 (×3): 40 mg via SUBCUTANEOUS
  Filled 2013-03-19 (×5): qty 0.4

## 2013-03-19 MED ORDER — NYSTATIN 100000 UNIT/ML MT SUSP
5.0000 mL | Freq: Four times a day (QID) | OROMUCOSAL | Status: DC
  Administered 2013-03-19 – 2013-03-25 (×24): 500000 [IU] via ORAL
  Filled 2013-03-19 (×27): qty 5

## 2013-03-19 MED ORDER — INSULIN ASPART PROT & ASPART (70-30 MIX) 100 UNIT/ML ~~LOC~~ SUSP: 15.0000 [IU] | Freq: Two times a day (BID) | SUBCUTANEOUS | Status: DC

## 2013-03-19 NOTE — Progress Notes (Signed)
Patient wanted to show me that his penis and scrotum were very red. Stated that he noticed the redness earlier in the day. He thinks that it is a yeast infection from the ABT treatment. Washed penis and scrotum very well and then placed anti-fungal powder on the area. Patient is not circumcised and did have a 'white substance' around the distal end of the penis once the foreskin was pulled back. No complaints of itching burring or soreness.  Will continue to monitor and let on coming staff know.   Tramaine Sauls RN [phone]

## 2013-03-19 NOTE — Progress Notes (Signed)
Patient has home CPAP in his room. RT will monitor.

## 2013-03-19 NOTE — Progress Notes (Addendum)
[facility] PROGRESS NOTE  Johnny Nichols ZOX:[id number] DOB: 12/01/1965 DOA: 03/15/2013 PCP: Provider Not In System  Assessment/Plan  Diabetes mellitus, new onset/DKA (diabetic ketoacidoses)  A1c 14.4 - BMP repeated this AM and gap reopened and bicarb 13 - Will repeat NOW and will need to restart insulin gtt if persistent -  70/30 insulin already given this evening  -  Continue metformin - extensive education in process re: injections and glucometer use  -  financially will need inexpensive meds OP since pays OOP  -  CM following re: DC planning  -  Check lipids specifically triglycerides   Acute encephalopathy  -due to DKA/resolved   Dehydration resolved.  Hypokalemia, persistent.  Replete with oral potassium  OSA (obstructive sleep apnea) stable.  Continue CPAP   Dental abscess/recent triple root canal  -likely precipitating event for DKA.  Transition to augmentin   Diet:  Diabetic diet Access:  PIV IVF:  OFF, but may need to restart if placing on insulin gtt Proph:  Change to lovenox for once daily dosing  Code Status: Full Family Communication: spoke with patient alone Disposition Plan: pending resolution of DKA.   Consultants:  None  Procedures:  None  Antibiotics: Zosyn 7/16 >> 7/19 Augmentin 7/19 >>  HPI/Subjective:  States he is eating somewhat better today and feeling much better.  Feels less fuzzy headed.    Objective: Filed Vitals:   03/18/13 2151 03/19/13 0438 03/19/13 0915 03/19/13 1335  BP: 149/82 149/83 146/75 140/70  Pulse: 96 93 92 99  Temp: 98.9 F (37.2 C) 98.6 F (37 C) 99.1 F (37.3 C) 99.4 F (37.4 C)  TempSrc: Oral Oral Oral Oral  Resp: 20 20 20 20   Height: 5\' 9"  (1.753 m)     Weight: 89.3 kg (196 lb 13.9 oz)     SpO2: 100% 100% 100% 98%    Intake/Output Summary (Last 24 hours) at 03/19/13 1745 Last data filed at 03/19/13 1300  Gross per 24 hour  Intake 3631.25 ml  Output   2575 ml  Net 1056.25 ml   Filed  Weights   03/15/13 2345 03/17/13 1203 03/18/13 2151  Weight: 86.3 kg (190 lb 4.1 oz) 86 kg (189 lb 9.5 oz) 89.3 kg (196 lb 13.9 oz)    Exam:   General:  Caucasian male,  No acute distress  HEENT:  NCAT, MMM, tongue appears somewhat glossy or smooth, swelling and tenderness along the lateral left jaw  Cardiovascular:  RRR, nl S1, S2 no mrg, 2+ pulses, warm extremities  Respiratory:  CTAB, no increased WOB  Abdomen:  NABS, soft, NT/ND  MSK:  Normal tone and bulk, no LEE  Neuro:  Grossly intact  Data Reviewed: Basic Metabolic Panel:  Recent Labs Lab 03/16/13 0002 03/16/13 0230  03/17/13 0846 03/17/13 2000 03/18/13 0716 03/18/13 2053 03/19/13 1006  NA 120* 123*  < > 130* 130* 133* 134* 133*  K 3.6 3.4*  < > 3.7 3.3* 3.0* 3.1* 3.2*  CL 95* 101  < > 107 103 103 102 102  CO2 <7* 8*  < > 15* 14* 15* 19 13*  GLUCOSE 608* 428*  < > 119* 219* 175* 211* 311*  BUN 38* 34*  < > 10 10 7 6 6   CREATININE 0.99 0.87  < > 0.62 0.62 0.60 0.63 0.68  CALCIUM 8.7 8.5  < > 8.8 8.7 8.7 8.9 9.0  MG  --  2.0  --   --   --   --   --  2.0  PHOS  --  1.8*  --   --   --   --   --   --   < > = values in this interval not displayed. Liver Function Tests:  Recent Labs Lab 03/16/13 0230  AST 11  ALT 6  ALKPHOS 91  BILITOT 0.1*  PROT 5.4*  ALBUMIN 2.0*    Recent Labs Lab 03/16/13 0230 03/17/13 0530 03/18/13 0716  LIPASE 1530* 463* 72*  AMYLASE 881*  --   --    No results found for this basename: AMMONIA,  in the last 168 hours CBC:  Recent Labs Lab 03/15/13 2023 03/16/13 0002 03/17/13 0530  WBC 27.8* 23.6* 13.1*  NEUTROABS 23.6*  --   --   HGB 15.5 16.3 12.9*  HCT 44.7 44.1 34.9*  MCV 84.5 77.9* 77.9*  PLT 217 271 172   Cardiac Enzymes:  Recent Labs Lab 03/16/13 0030 03/16/13 0755  TROPONINI <0.30 <0.30   BNP (last 3 results) No results found for this basename: PROBNP,  in the last 8760 hours CBG:  Recent Labs Lab 03/18/13 1744 03/18/13 2156 03/19/13 0739  03/19/13 1126 03/19/13 1656  GLUCAP 225* 189* 207* 282* 228*    Recent Results (from the past 240 hour(s))  MRSA PCR SCREENING     Status: None   Collection Time    03/15/13 11:47 PM      Result Value Range Status   MRSA by PCR NEGATIVE  NEGATIVE Final   Comment:            The GeneXpert MRSA Assay (FDA     approved for NASAL specimens     only), is one component of a     comprehensive MRSA colonization     surveillance program. It is not     intended to diagnose MRSA     infection nor to guide or     monitor treatment for     MRSA infections.  CULTURE, BLOOD (ROUTINE X 2)     Status: None   Collection Time    03/16/13 12:11 AM      Result Value Range Status   Specimen Description BLOOD RIGHT ARM   Final   Special Requests BOTTLES DRAWN AEROBIC AND ANAEROBIC 10CC EACH   Final   Culture  Setup Time 03/16/2013 09:26   Final   Culture     Final   Value:        BLOOD CULTURE RECEIVED NO GROWTH TO DATE CULTURE WILL BE HELD FOR 5 DAYS BEFORE ISSUING A FINAL NEGATIVE REPORT   Report Status PENDING   Incomplete  CULTURE, BLOOD (ROUTINE X 2)     Status: None   Collection Time    03/16/13 12:30 AM      Result Value Range Status   Specimen Description BLOOD RIGHT HAND   Final   Special Requests BOTTLES DRAWN AEROBIC ONLY 10CC   Final   Culture  Setup Time 03/16/2013 09:26   Final   Culture     Final   Value:        BLOOD CULTURE RECEIVED NO GROWTH TO DATE CULTURE WILL BE HELD FOR 5 DAYS BEFORE ISSUING A FINAL NEGATIVE REPORT   Report Status PENDING   Incomplete  URINE CULTURE     Status: None   Collection Time    03/16/13  2:19 AM      Result Value Range Status   Specimen Description URINE, CLEAN CATCH   Final  Special Requests NONE   Final   Culture  Setup Time 03/16/2013 03:34   Final   Colony Count NO GROWTH   Final   Culture NO GROWTH   Final   Report Status 03/17/2013 FINAL   Final     Studies: No results found.  Scheduled Meds: . antiseptic oral rinse  15 mL  Mouth Rinse q12n4p  . bd getting started take home kit  1 kit Other Once  . heparin  5,000 Units Subcutaneous Q8H  . insulin aspart  0-15 Units Subcutaneous TID WC  . insulin aspart  0-5 Units Subcutaneous QHS  . insulin aspart protamine- aspart  10 Units Subcutaneous BID WC  . living well with diabetes book   Does not apply Once  . metFORMIN  500 mg Oral BID WC  . nystatin  5 mL Oral QID  . nystatin cream   Topical BID  . pantoprazole  40 mg Oral Daily  . piperacillin-tazobactam (ZOSYN)  IV  3.375 g Intravenous Q8H   Continuous Infusions:   Active Problems:   Diabetes mellitus, new onset   DKA (diabetic ketoacidoses)   Acute encephalopathy   OSA (obstructive sleep apnea)   Dental abscess/recent triple root canal   Dehydration   Hypokalemia    Time spent: 30 min    Johnny Nichols  [facility] Pager [phone]. If 7PM-7AM, please contact night-coverage at [email or web address], password [facility] 03/19/2013, 5:45 PM  LOS: 4 days

## 2013-03-20 DIAGNOSIS — E860 Dehydration: Secondary | ICD-10-CM

## 2013-03-20 LAB — BASIC METABOLIC PANEL
CO2: 18 mEq/L — ABNORMAL LOW (ref 19–32)
CO2: 23 mEq/L (ref 19–32)
Calcium: 9.2 mg/dL (ref 8.4–10.5)
Calcium: 9.3 mg/dL (ref 8.4–10.5)
Calcium: 9.2 mg/dL (ref 8.4–10.5)
Calcium: 9.4 mg/dL (ref 8.4–10.5)
Chloride: 100 mEq/L (ref 96–112)
Chloride: 101 mEq/L (ref 96–112)
Creatinine, Ser: 0.61 mg/dL (ref 0.50–1.35)
Creatinine, Ser: 0.57 mg/dL (ref 0.50–1.35)
GFR calc Af Amer: >90 mL/min (ref >90)
GFR calc Af Amer: >90 mL/min (ref >90)
GFR calc Af Amer: >90 mL/min (ref >90)
GFR calc Af Amer: >90 mL/min (ref >90)
GFR calc non Af Amer: >90 mL/min (ref >90)
GFR calc non Af Amer: >90 mL/min (ref >90)
GFR calc non Af Amer: >90 mL/min (ref >90)
GFR calc non Af Amer: >90 mL/min (ref >90)
Potassium: 3.4 mEq/L — ABNORMAL LOW (ref 3.5–5.1)
Sodium: 137 mEq/L (ref 135–145)
Sodium: 134 mEq/L — ABNORMAL LOW (ref 135–145)
Sodium: 136 mEq/L (ref 135–145)
Sodium: 136 mEq/L (ref 135–145)

## 2013-03-20 LAB — GLUCOSE, CAPILLARY
Glucose-Capillary: 257 mg/dL — ABNORMAL HIGH (ref 70–99)
Glucose-Capillary: 209 mg/dL — ABNORMAL HIGH (ref 70–99)
Glucose-Capillary: 202 mg/dL — ABNORMAL HIGH (ref 70–99)

## 2013-03-20 MED ORDER — INSULIN REGULAR BOLUS VIA INFUSION
0.0000 [IU] | Freq: Three times a day (TID) | INTRAVENOUS | Status: DC
  Administered 2013-03-20 (×2): 6 [IU] via INTRAVENOUS
  Filled 2013-03-20: qty 10

## 2013-03-20 MED ORDER — DEXTROSE 50 % IV SOLN: 25.0000 mL | INTRAVENOUS | Status: DC | PRN

## 2013-03-20 MED ORDER — POTASSIUM CHLORIDE 10 MEQ/100ML IV SOLN
10.0000 meq | INTRAVENOUS | Status: AC
  Administered 2013-03-20 – 2013-03-21 (×3): 10 meq via INTRAVENOUS
  Filled 2013-03-20 (×3): qty 100

## 2013-03-20 MED ORDER — SODIUM CHLORIDE 0.9 % IV SOLN
INTRAVENOUS | Status: DC
  Administered 2013-03-20: 10:00:00 via INTRAVENOUS
  Administered 2013-03-20: 4.8 [IU]/h via INTRAVENOUS
  Filled 2013-03-20: qty 1

## 2013-03-20 MED ORDER — DEXTROSE-NACL 5-0.45 % IV SOLN: INTRAVENOUS | Status: DC

## 2013-03-20 MED ORDER — POTASSIUM CHLORIDE CRYS ER 20 MEQ PO TBCR
40.0000 meq | EXTENDED_RELEASE_TABLET | Freq: Once | ORAL | Status: AC
  Administered 2013-03-20: 40 meq via ORAL
  Filled 2013-03-20: qty 2

## 2013-03-20 MED ORDER — SODIUM CHLORIDE 0.9 % IV SOLN
INTRAVENOUS | Status: DC
  Administered 2013-03-20: 11:00:00 via INTRAVENOUS

## 2013-03-20 NOTE — Progress Notes (Signed)
[facility] PROGRESS NOTE  Johnny Nichols ONG:[id number] DOB: Sep 11, 1965 DOA: 03/15/2013 PCP: Provider Not In System  Assessment/Plan  Diabetes mellitus, new onset/DKA (diabetic ketoacidoses)  A1c 14.4.  Recurrent DKA. - Insulin drip - Transition from normal saline to dextrose fluids when fingerstick is less than 250 -  Continue insulin infusion until the bicarbonate is 20 or higher and anion gap is 12 or less -  The patient needs to be well out of diabetic ketoacidosis prior to transitioning to subcutaneous insulin to prevent recurrence -  Continue metformin -  extensive education in process re: injections and glucometer use  -  financially will need inexpensive meds OP since pays OOP  -  CM following re: DC planning  -  Check lipids specifically triglycerides   Acute encephalopathy, resolved. Dehydration resolved.  Hypokalemia, persistent.  Replete with oral potassium  OSA (obstructive sleep apnea) stable.  Continue CPAP   Dental abscess/recent triple root canal  - Continue augmentin   Candidal dermatitis on scrotum/penis, improving -  Continue nystatin  Diet:  Diabetic diet Access:  PIV IVF:  OFF, but may need to restart if placing on insulin gtt Proph:  Change to lovenox for once daily dosing  Code Status: Full Family Communication: spoke with patient alone Disposition Plan: pending resolution of DKA.   Consultants:  None  Procedures:  None  Antibiotics: Zosyn 7/16 >> 7/19 Augmentin 7/19 >>  HPI/Subjective:  Has some mild abdominal pain and persistently blurry vision. Denies nausea and has been able to eat his meals. His face is less swollen and his tooth is less painful. 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  Objective: Filed Vitals:   03/19/13 2114 03/20/13 0558 03/20/13 0945 03/20/13 1325  BP: 136/65 129/67 138/75 129/70  Pulse: 91 84 102 90  Temp: 99.2 F (37.3 C) 99.2 F (37.3 C) 98.6 F (37 C) 98.9 F (37.2 C)  TempSrc: Oral Oral  Axillary Oral  Resp: 20 18 20 18   Height:      Weight: 89 kg (196 lb 3.4 oz)     SpO2: 100% 100% 99% 100%    Intake/Output Summary (Last 24 hours) at 03/20/13 1614 Last data filed at 03/20/13 1300  Gross per 24 hour  Intake    530 ml  Output   1000 ml  Net   -470 ml   Filed Weights   03/17/13 1203 03/18/13 2151 03/19/13 2114  Weight: 86 kg (189 lb 9.5 oz) 89.3 kg (196 lb 13.9 oz) 89 kg (196 lb 3.4 oz)    Exam:   General:  Caucasian male,  No acute distress  HEENT:  NCAT, MMM, less swelling along the left lateral jaw  Cardiovascular:  RRR, nl S1, S2 no mrg, 2+ pulses, warm extremities  Respiratory:  CTAB, no increased WOB  Abdomen:  NABS, soft, NT/ND  MSK:  Normal tone and bulk, no LEE  Neuro:  Grossly intact  Data Reviewed: Basic Metabolic Panel:  Recent Labs Lab 03/16/13 0002 03/16/13 0230  03/19/13 1006 03/19/13 1824 03/20/13 0600 03/20/13 0900 03/20/13 1201  NA 120* 123*  < > 133* 134* 137 136 134*  K 3.6 3.4*  < > 3.2* 3.1* 3.4* 3.4* 3.3*  CL 95* 101  < > 102 101 103 100 102  CO2 <7* 8*  < > 13* 19 18* 16* 18*  GLUCOSE 608* 428*  < > 311* 220* 256* 249* 316*  BUN 38* 34*  < > 6 5* 6 6 6   CREATININE 0.99 0.87  < >  0.68 0.60 0.66 0.61 0.64  CALCIUM 8.7 8.5  < > 9.0 9.4 9.2 9.4 9.3  MG  --  2.0  --  2.0  --   --   --   --   PHOS  --  1.8*  --   --   --   --   --   --   < > = values in this interval not displayed. Liver Function Tests:  Recent Labs Lab 03/16/13 0230  AST 11  ALT 6  ALKPHOS 91  BILITOT 0.1*  PROT 5.4*  ALBUMIN 2.0*    Recent Labs Lab 03/16/13 0230 03/17/13 0530 03/18/13 0716  LIPASE 1530* 463* 72*  AMYLASE 881*  --   --    No results found for this basename: AMMONIA,  in the last 168 hours CBC:  Recent Labs Lab 03/15/13 2023 03/16/13 0002 03/17/13 0530  WBC 27.8* 23.6* 13.1*  NEUTROABS 23.6*  --   --   HGB 15.5 16.3 12.9*  HCT 44.7 44.1 34.9*  MCV 84.5 77.9* 77.9*  PLT 217 271 172   Cardiac  Enzymes:  Recent Labs Lab 03/16/13 0030 03/16/13 0755  TROPONINI <0.30 <0.30   BNP (last 3 results) No results found for this basename: PROBNP,  in the last 8760 hours CBG:  Recent Labs Lab 03/19/13 1126 03/19/13 1656 03/19/13 2121 03/20/13 0759 03/20/13 1116  GLUCAP 282* 228* 211* 257* 328*    Recent Results (from the past 240 hour(s))  MRSA PCR SCREENING     Status: None   Collection Time    03/15/13 11:47 PM      Result Value Range Status   MRSA by PCR NEGATIVE  NEGATIVE Final   Comment:            The GeneXpert MRSA Assay (FDA     approved for NASAL specimens     only), is one component of a     comprehensive MRSA colonization     surveillance program. It is not     intended to diagnose MRSA     infection nor to guide or     monitor treatment for     MRSA infections.  CULTURE, BLOOD (ROUTINE X 2)     Status: None   Collection Time    03/16/13 12:11 AM      Result Value Range Status   Specimen Description BLOOD RIGHT ARM   Final   Special Requests BOTTLES DRAWN AEROBIC AND ANAEROBIC 10CC EACH   Final   Culture  Setup Time 03/16/2013 09:26   Final   Culture     Final   Value:        BLOOD CULTURE RECEIVED NO GROWTH TO DATE CULTURE WILL BE HELD FOR 5 DAYS BEFORE ISSUING A FINAL NEGATIVE REPORT   Report Status PENDING   Incomplete  CULTURE, BLOOD (ROUTINE X 2)     Status: None   Collection Time    03/16/13 12:30 AM      Result Value Range Status   Specimen Description BLOOD RIGHT HAND   Final   Special Requests BOTTLES DRAWN AEROBIC ONLY 10CC   Final   Culture  Setup Time 03/16/2013 09:26   Final   Culture     Final   Value:        BLOOD CULTURE RECEIVED NO GROWTH TO DATE CULTURE WILL BE HELD FOR 5 DAYS BEFORE ISSUING A FINAL NEGATIVE REPORT   Report Status PENDING   Incomplete  URINE CULTURE  Status: None   Collection Time    03/16/13  2:19 AM      Result Value Range Status   Specimen Description URINE, CLEAN CATCH   Final   Special Requests NONE    Final   Culture  Setup Time 03/16/2013 03:34   Final   Colony Count NO GROWTH   Final   Culture NO GROWTH   Final   Report Status 03/17/2013 FINAL   Final     Studies: No results found.  Scheduled Meds: . amoxicillin-clavulanate  1 tablet Oral Q12H  . antiseptic oral rinse  15 mL Mouth Rinse q12n4p  . bd getting started take home kit  1 kit Other Once  . enoxaparin (LOVENOX) injection  40 mg Subcutaneous Q24H  . insulin regular  0-10 Units Intravenous TID WC  . living well with diabetes book   Does not apply Once  . metFORMIN  500 mg Oral BID WC  . nystatin  5 mL Oral QID  . nystatin cream   Topical BID  . pantoprazole  40 mg Oral Daily  . potassium chloride  40 mEq Oral Once   Continuous Infusions: . sodium chloride 100 mL/hr at 03/20/13 1121  . dextrose 5 % and 0.45% NaCl    . insulin (NOVOLIN-R) infusion      Active Problems:   Diabetes mellitus, new onset   DKA (diabetic ketoacidoses)   Acute encephalopathy   OSA (obstructive sleep apnea)   Dental abscess/recent triple root canal   Dehydration   Hypokalemia    Time spent: 30 min    Taleigh Gero  [facility] Pager [phone]. If 7PM-7AM, please contact night-coverage at [email or web address], password [facility] 03/20/2013, 4:14 PM  LOS: 5 days

## 2013-03-21 LAB — GLUCOSE, CAPILLARY
Glucose-Capillary: 134 mg/dL — ABNORMAL HIGH (ref 70–99)
Glucose-Capillary: 173 mg/dL — ABNORMAL HIGH (ref 70–99)
Glucose-Capillary: 213 mg/dL — ABNORMAL HIGH (ref 70–99)
Glucose-Capillary: 252 mg/dL — ABNORMAL HIGH (ref 70–99)
Glucose-Capillary: 207 mg/dL — ABNORMAL HIGH (ref 70–99)

## 2013-03-21 LAB — BASIC METABOLIC PANEL
BUN: 5 mg/dL — ABNORMAL LOW (ref 6–23)
CO2: 22 mEq/L (ref 19–32)
Calcium: 9.2 mg/dL (ref 8.4–10.5)
Chloride: 102 mEq/L (ref 96–112)
Creatinine, Ser: 0.61 mg/dL (ref 0.50–1.35)
GFR calc Af Amer: >90 mL/min (ref >90)
GFR calc Af Amer: >90 mL/min (ref >90)
GFR calc non Af Amer: >90 mL/min (ref >90)
GFR calc non Af Amer: >90 mL/min (ref >90)
Glucose, Bld: 203 mg/dL — ABNORMAL HIGH (ref 70–99)
Potassium: 3.3 mEq/L — ABNORMAL LOW (ref 3.5–5.1)
Potassium: 3.2 mEq/L — ABNORMAL LOW (ref 3.5–5.1)
Sodium: 135 mEq/L (ref 135–145)
Sodium: 134 mEq/L — ABNORMAL LOW (ref 135–145)

## 2013-03-21 MED ORDER — INSULIN GLARGINE 100 UNIT/ML ~~LOC~~ SOLN
18.0000 [IU] | Freq: Every day | SUBCUTANEOUS | Status: DC
  Filled 2013-03-21: qty 0.18

## 2013-03-21 MED ORDER — INSULIN GLARGINE 100 UNIT/ML ~~LOC~~ SOLN
5.0000 [IU] | Freq: Once | SUBCUTANEOUS | Status: AC
  Administered 2013-03-21: 5 [IU] via SUBCUTANEOUS
  Filled 2013-03-21: qty 0.05

## 2013-03-21 MED ORDER — INSULIN ASPART 100 UNIT/ML ~~LOC~~ SOLN
0.0000 [IU] | Freq: Every day | SUBCUTANEOUS | Status: DC
  Administered 2013-03-21: 3 [IU] via SUBCUTANEOUS

## 2013-03-21 MED ORDER — INSULIN ASPART 100 UNIT/ML ~~LOC~~ SOLN
0.0000 [IU] | Freq: Three times a day (TID) | SUBCUTANEOUS | Status: DC
Start: 2013-03-21 — End: 2013-03-22
  Administered 2013-03-21: 5 [IU] via SUBCUTANEOUS

## 2013-03-21 MED ORDER — POTASSIUM CHLORIDE CRYS ER 20 MEQ PO TBCR
40.0000 meq | EXTENDED_RELEASE_TABLET | Freq: Once | ORAL | Status: AC
  Administered 2013-03-21: 40 meq via ORAL

## 2013-03-21 MED ORDER — INSULIN ASPART 100 UNIT/ML ~~LOC~~ SOLN
0.0000 [IU] | Freq: Three times a day (TID) | SUBCUTANEOUS | Status: DC
  Administered 2013-03-21: 5 [IU] via SUBCUTANEOUS
  Administered 2013-03-21: 3 [IU] via SUBCUTANEOUS

## 2013-03-21 MED ORDER — INSULIN GLARGINE 100 UNIT/ML ~~LOC~~ SOLN
10.0000 [IU] | Freq: Every day | SUBCUTANEOUS | Status: DC
  Administered 2013-03-21: 10 [IU] via SUBCUTANEOUS
  Filled 2013-03-21: qty 0.1

## 2013-03-21 NOTE — Progress Notes (Signed)
RT Note; Pt has home CPAP at bedside with plans to wear tonight.

## 2013-03-21 NOTE — Progress Notes (Signed)
Patient has home machine at bedside and will place himself on at bedtime.

## 2013-03-21 NOTE — Progress Notes (Signed)
[facility] PROGRESS NOTE  Johnny Nichols ZOX:[id number] DOB: 03-Aug-1966 DOA: 03/15/2013 PCP: Provider Not In System  Assessment/Plan  Diabetes mellitus, new onset/DKA (diabetic ketoacidoses)  A1c 14.4.  Recurrent DKA. -  Insulin drip OFF overnight -  Levemir 15 units this AM (may need closer to 25 units by weight) -  Increase to moderate dose SSI -  Continue metformin -  extensive education in process re: injections and glucometer use  -  financially will need inexpensive meds OP since pays OOP  -  CM following re: DC planning  -  Check lipids specifically triglycerides   Acute encephalopathy, resolved. Dehydration resolved.  Hypokalemia, persistent.  Replete with oral potassium  OSA (obstructive sleep apnea) stable.  Continue CPAP   Dental abscess/recent triple root canal  - Continue augmentin   Candidal dermatitis on scrotum/penis, improving -  Continue nystatin  Diet:  Diabetic diet Access:  PIV IVF:  OFF, but may need to restart if placing on insulin gtt Proph:  Change to lovenox for once daily dosing  Code Status: Full Family Communication: spoke with patient alone Disposition Plan: home tomorrow.     Consultants:  None  Procedures:  None  Antibiotics: Zosyn 7/16 >> 7/19 Augmentin 7/19 >>  HPI/Subjective:  Feels somewhat SOB.  Blurry vision makes it hard to see close up and therefore hard to see insulin in syringe.    Objective: Filed Vitals:   03/20/13 2100 03/21/13 0503 03/21/13 0925 03/21/13 1655  BP: 143/79 147/83 143/85 142/77  Pulse: 90 85 102 94  Temp: 99.9 F (37.7 C) 97.5 F (36.4 C) 98.5 F (36.9 C) 98.7 F (37.1 C)  TempSrc: Oral Oral    Resp: 18 17 21 20   Height: 5\' 9"  (1.753 m)     Weight: 89.54 kg (197 lb 6.4 oz)     SpO2: 96% 99% 96% 96%    Intake/Output Summary (Last 24 hours) at 03/21/13 1710 Last data filed at 03/21/13 0926  Gross per 24 hour  Intake  818.5 ml  Output      0 ml  Net  818.5 ml   Filed  Weights   03/18/13 2151 03/19/13 2114 03/20/13 2100  Weight: 89.3 kg (196 lb 13.9 oz) 89 kg (196 lb 3.4 oz) 89.54 kg (197 lb 6.4 oz)    Exam:   General:  Caucasian male,  No acute distress, walking around room  HEENT:  NCAT, MMM, less swelling along the left lateral jaw  Cardiovascular:  RRR, nl S1, S2 no mrg, 2+ pulses, warm extremities  Respiratory:  CTAB, no increased WOB  Abdomen:  NABS, soft, NT/ND  MSK:  Normal tone and bulk, no LEE  Neuro:  Grossly intact  Data Reviewed: Basic Metabolic Panel:  Recent Labs Lab 03/16/13 0002 03/16/13 0230  03/19/13 1006  03/20/13 1201 03/20/13 1911 03/20/13 2304 03/21/13 0455 03/21/13 0803  NA 120* 123*  < > 133*  < > 134* 136 136 135 134*  K 3.6 3.4*  < > 3.2*  < > 3.3* 2.9* 3.0* 3.3* 3.2*  CL 95* 101  < > 102  < > 102 101 100 102 101  CO2 <7* 8*  < > 13*  < > 18* 22 23 22 20   GLUCOSE 608* 428*  < > 311*  < > 316* 162* 173* 203* 238*  BUN 38* 34*  < > 6  < > 6 5* 4* 5* 5*  CREATININE 0.99 0.87  < > 0.68  < > 0.64 0.57  0.57 0.61 0.56  CALCIUM 8.7 8.5  < > 9.0  < > 9.3 9.2 9.4 9.2 9.0  MG  --  2.0  --  2.0  --   --   --   --   --   --   PHOS  --  1.8*  --   --   --   --   --   --   --   --   < > = values in this interval not displayed. Liver Function Tests:  Recent Labs Lab 03/16/13 0230  AST 11  ALT 6  ALKPHOS 91  BILITOT 0.1*  PROT 5.4*  ALBUMIN 2.0*    Recent Labs Lab 03/16/13 0230 03/17/13 0530 03/18/13 0716  LIPASE 1530* 463* 72*  AMYLASE 881*  --   --    No results found for this basename: AMMONIA,  in the last 168 hours CBC:  Recent Labs Lab 03/15/13 2023 03/16/13 0002 03/17/13 0530  WBC 27.8* 23.6* 13.1*  NEUTROABS 23.6*  --   --   HGB 15.5 16.3 12.9*  HCT 44.7 44.1 34.9*  MCV 84.5 77.9* 77.9*  PLT 217 271 172   Cardiac Enzymes:  Recent Labs Lab 03/16/13 0030 03/16/13 0755  TROPONINI <0.30 <0.30   BNP (last 3 results) No results found for this basename: PROBNP,  in the last 8760  hours CBG:  Recent Labs Lab 03/20/13 2313 03/21/13 0016 03/21/13 0127 03/21/13 0736 03/21/13 1148  GLUCAP 174* 173* 245* 213* 252*    Recent Results (from the past 240 hour(s))  MRSA PCR SCREENING     Status: None   Collection Time    03/15/13 11:47 PM      Result Value Range Status   MRSA by PCR NEGATIVE  NEGATIVE Final   Comment:            The GeneXpert MRSA Assay (FDA     approved for NASAL specimens     only), is one component of a     comprehensive MRSA colonization     surveillance program. It is not     intended to diagnose MRSA     infection nor to guide or     monitor treatment for     MRSA infections.  CULTURE, BLOOD (ROUTINE X 2)     Status: None   Collection Time    03/16/13 12:11 AM      Result Value Range Status   Specimen Description BLOOD RIGHT ARM   Final   Special Requests BOTTLES DRAWN AEROBIC AND ANAEROBIC 10CC EACH   Final   Culture  Setup Time 03/16/2013 09:26   Final   Culture     Final   Value:        BLOOD CULTURE RECEIVED NO GROWTH TO DATE CULTURE WILL BE HELD FOR 5 DAYS BEFORE ISSUING A FINAL NEGATIVE REPORT   Report Status PENDING   Incomplete  CULTURE, BLOOD (ROUTINE X 2)     Status: None   Collection Time    03/16/13 12:30 AM      Result Value Range Status   Specimen Description BLOOD RIGHT HAND   Final   Special Requests BOTTLES DRAWN AEROBIC ONLY 10CC   Final   Culture  Setup Time 03/16/2013 09:26   Final   Culture     Final   Value:        BLOOD CULTURE RECEIVED NO GROWTH TO DATE CULTURE WILL BE HELD FOR 5 DAYS BEFORE ISSUING A  FINAL NEGATIVE REPORT   Report Status PENDING   Incomplete  URINE CULTURE     Status: None   Collection Time    03/16/13  2:19 AM      Result Value Range Status   Specimen Description URINE, CLEAN CATCH   Final   Special Requests NONE   Final   Culture  Setup Time 03/16/2013 03:34   Final   Colony Count NO GROWTH   Final   Culture NO GROWTH   Final   Report Status 03/17/2013 FINAL   Final      Studies: No results found.  Scheduled Meds: . amoxicillin-clavulanate  1 tablet Oral Q12H  . antiseptic oral rinse  15 mL Mouth Rinse q12n4p  . bd getting started take home kit  1 kit Other Once  . enoxaparin (LOVENOX) injection  40 mg Subcutaneous Q24H  . insulin aspart  0-15 Units Subcutaneous TID WC  . insulin aspart  0-5 Units Subcutaneous QHS  . [START ON 03/22/2013] insulin glargine  18 Units Subcutaneous Daily  . living well with diabetes book   Does not apply Once  . metFORMIN  500 mg Oral BID WC  . nystatin  5 mL Oral QID  . nystatin cream   Topical BID  . pantoprazole  40 mg Oral Daily   Continuous Infusions:    Active Problems:   Diabetes mellitus, new onset   DKA (diabetic ketoacidoses)   Acute encephalopathy   OSA (obstructive sleep apnea)   Dental abscess/recent triple root canal   Dehydration   Hypokalemia    Time spent: 30 min    Corey Laski  [facility] Pager [phone]. If 7PM-7AM, please contact night-coverage at [email or web address], password [facility] 03/21/2013, 5:10 PM  LOS: 6 days

## 2013-03-22 DIAGNOSIS — H538 Other visual disturbances: Secondary | ICD-10-CM

## 2013-03-22 LAB — BASIC METABOLIC PANEL
Calcium: 8.9 mg/dL (ref 8.4–10.5)
GFR calc Af Amer: >90 mL/min (ref >90)
GFR calc non Af Amer: >90 mL/min (ref >90)
Potassium: 3.9 mEq/L (ref 3.5–5.1)
Sodium: 136 mEq/L (ref 135–145)

## 2013-03-22 LAB — GLUCOSE, CAPILLARY
Glucose-Capillary: 260 mg/dL — ABNORMAL HIGH (ref 70–99)
Glucose-Capillary: 276 mg/dL — ABNORMAL HIGH (ref 70–99)

## 2013-03-22 LAB — CULTURE, BLOOD (ROUTINE X 2)

## 2013-03-22 MED ORDER — INSULIN NPH (HUMAN) (ISOPHANE) 100 UNIT/ML ~~LOC~~ SUSP
6.0000 [IU] | Freq: Once | SUBCUTANEOUS | Status: AC
  Administered 2013-03-22: 6 [IU] via SUBCUTANEOUS
  Filled 2013-03-22: qty 10

## 2013-03-22 MED ORDER — INSULIN ASPART PROT & ASPART (70-30 MIX) 100 UNIT/ML ~~LOC~~ SUSP
28.0000 [IU] | Freq: Two times a day (BID) | SUBCUTANEOUS | Status: DC
  Administered 2013-03-23 – 2013-03-25 (×5): 28 [IU] via SUBCUTANEOUS
  Filled 2013-03-22: qty 10

## 2013-03-22 MED ORDER — INSULIN ASPART 100 UNIT/ML ~~LOC~~ SOLN: 3.0000 [IU] | Freq: Three times a day (TID) | SUBCUTANEOUS | Status: DC

## 2013-03-22 MED ORDER — INSULIN GLARGINE 100 UNIT/ML ~~LOC~~ SOLN: 22.0000 [IU] | Freq: Every day | SUBCUTANEOUS | Status: DC

## 2013-03-22 MED ORDER — INSULIN ASPART PROT & ASPART (70-30 MIX) 100 UNIT/ML ~~LOC~~ SUSP
22.0000 [IU] | Freq: Two times a day (BID) | SUBCUTANEOUS | Status: DC
  Administered 2013-03-22 (×2): 22 [IU] via SUBCUTANEOUS
  Filled 2013-03-22: qty 10

## 2013-03-22 NOTE — Progress Notes (Signed)
   CARE MANAGEMENT NOTE 03/22/2013  Patient:  Johnny Nichols, Johnny Nichols   Account Number:  [id number]  Date Initiated:  03/16/2013  Documentation initiated by:  Alvira Philips Assessment:   47 yr-old male adm with dx of new onset DM, DKA; lives with SO, independent PTA     Action/Plan:   Anticipated DC Date:     Anticipated DC Plan:  HOME/SELF CARE      DC Planning Services  CM consult  MATCH Program  Medication Assistance      Choice offered to / List presented to:             Status of service:  In process, will continue to follow Medicare Important Message given?   (If response is "NO", the following Medicare IM given date fields will be blank) Date Medicare IM given:   Date Additional Medicare IM given:    Discharge Disposition:    Per UR Regulation:  Reviewed for med. necessity/level of care/duration of stay  If discussed at Long Length of Stay Meetings, dates discussed:    Comments:  ContactTheora Gianotti, [place]  #[phone]  03/22/2013  [address] RN, [place]  [phone] Spoke with patient regardine new DM diagnosis and discharge planning. He does not have insurance for his medications. Provided patient with MATCH letter to get medications at discharge. He does not have a PCP,  he has appointment with Dr Laural Benes on 03/25/2013.  [facility]ided [facility]. Gave information for free glucometer at [email or web address]   Call to Diabetic Coordinator to meet with patient regarding his DM.  03/17/13 1100 Verdis Prime RN MSN BSN CCM Appt arranged for Thursday, July 24th @ 1:00 p.m. with Dr Laural Benes.  03/16/13 1040 Henrietta Mayo RN MSN BSN CCM Pt works two jobs but has no insurance per [employer].  Discussed new dx, need for PCP.  Provided information re [facility], will contact for appt per request.  Pt will qualify for [facility] Program when discharged, pharmaceutical company patient assistance program applications  can be provided when medications are determined.

## 2013-03-22 NOTE — Progress Notes (Signed)
Inpatient Diabetes Program Recommendations  AACE/ADA: New Consensus Statement on Inpatient Glycemic Control (2013)  Target Ranges:  Prepandial:   less than 140 mg/dL      Peak postprandial:   less than 180 mg/dL (1-2 hours)      Critically ill patients:  140 - 180 mg/dL     Admitted with DKA.  DM Coordinator spent time with this patient on 07/17.  Revisited with patient today to assess his learning needs and answer any questions he might have.  Patient stated he watched all the DM videos on the patient education network and has read through the DM book.  Reviewed s/sxs of hypo and hyperglycemia and proper treatment.  Encouraged patient to purchase some juice boxes for home for hypo treatment.  Gave patient information on purchasing an inexpensive CBG meter OTC at [employer]  Patient to get 1 month free meds through the [facility] program.  Instructed patient to purchase his 70/30 insulin at [facility] after d/c.  Patient can get 70/30 insulin for $25 per vial.  Also instructed patient how to take 70/30 insulin at home.  Patient is able to read an insulin syringe and told me he has practiced drawing up and administering insulin here in hospital.  Noted RD came to patient's room during my visit to reinforce nutrition education.   MD- Please make sure to give patient Rxs for the following at d/c: 1. Novolin Reli-on 70/30 insulin (can get at [facility] for $25 per vial) 2. Insulin syringes (U-100- 0.84mL)  Will follow. Ambrose Finland RN, MSN, CDE Diabetes Coordinator Inpatient Diabetes Program [phone]

## 2013-03-22 NOTE — Progress Notes (Addendum)
[facility] PROGRESS NOTE  Johnny Nichols WUJ:[id number] DOB: [birth year] DOA: 03/15/2013 PCP: Provider Not In System  Assessment/Plan  Diabetes mellitus, new onset/DKA (diabetic ketoacidoses)  A1c 14.4.  Hyperglycemic today.   -  Levemir 22 units BID >> increased to 28 units this evening because of persistent hyperglycemia during day today -  Continue metformin -  extensive education in process re: injections and glucometer use. -  CM following re: MATCH  Blurry vision likely due to recurrent DKA, improving -   Was still not able to see syringe well enough to accurately pull up insulin and was off by several units.  Was also having difficulty getting blood on the test strip.  Vision appears to be getting better so anticipate discharge tomorrow AM.   Acute encephalopathy, resolved. Dehydration resolved. OSA (obstructive sleep apnea) stable.  Continue CPAP  Dental abscess/recent triple root canal, Continue augmentin  Candidal dermatitis on scrotum/penis, improving, Continue nystatin  Diet:  Diabetic diet Access:  PIV IVF:  OFF Proph: lovenox  Code Status: Full Family Communication: spoke with patient alone Disposition Plan: home tomorrow AM   Consultants:  None  Procedures:  None  Antibiotics: Zosyn 7/16 >> 7/19 Augmentin 7/19 >>  HPI/Subjective:  Feels somewhat SOB and fatigued.  Blurry vision makes it hard to see close up and therefore hard to see insulin in syringe.  Nurse reports that he was unable to pull up the correct amount of insulin. Nurse tech told me that he was unable to get blood on the test strip without assistance.    Objective: Filed Vitals:   03/21/13 2128 03/22/13 0621 03/22/13 1349 03/22/13 1630  BP: 124/77 132/80 133/86 144/79  Pulse: 90 88 90 93  Temp: 99.9 F (37.7 C) 98.7 F (37.1 C) 98.9 F (37.2 C) 99.3 F (37.4 C)  TempSrc: Oral Oral    Resp: 18 18 18 18   Height:      Weight: 89.8 kg (197 lb 15.6 oz)     SpO2: 96% 97% 98%  96%    Intake/Output Summary (Last 24 hours) at 03/22/13 2104 Last data filed at 03/22/13 1842  Gross per 24 hour  Intake    240 ml  Output      0 ml  Net    240 ml   Filed Weights   03/19/13 2114 03/20/13 2100 03/21/13 2128  Weight: 89 kg (196 lb 3.4 oz) 89.54 kg (197 lb 6.4 oz) 89.8 kg (197 lb 15.6 oz)    Exam:   General:  Caucasian male,  No acute distress  HEENT:  NCAT, MMM, less swelling along the left lateral jaw  Cardiovascular:  RRR, nl S1, S2 no mrg, 2+ pulses, warm extremities  Respiratory:  CTAB, no increased WOB  Abdomen:  NABS, soft, NT/ND  MSK:  Normal tone and bulk, no LEE  Neuro:  Grossly intact  Data Reviewed: Basic Metabolic Panel:  Recent Labs Lab 03/16/13 0002 03/16/13 0230  03/19/13 1006  03/20/13 1911 03/20/13 2304 03/21/13 0455 03/21/13 0803 03/22/13 0540  NA 120* 123*  < > 133*  < > 136 136 135 134* 136  K 3.6 3.4*  < > 3.2*  < > 2.9* 3.0* 3.3* 3.2* 3.9  CL 95* 101  < > 102  < > 101 100 102 101 101  CO2 <7* 8*  < > 13*  < > 22 23 22 20 27   GLUCOSE 608* 428*  < > 311*  < > 162* 173* 203* 238* 284*  BUN  38* 34*  < > 6  < > 5* 4* 5* 5* 5*  CREATININE 0.99 0.87  < > 0.68  < > 0.57 0.57 0.61 0.56 0.60  CALCIUM 8.7 8.5  < > 9.0  < > 9.2 9.4 9.2 9.0 8.9  MG  --  2.0  --  2.0  --   --   --   --   --   --   PHOS  --  1.8*  --   --   --   --   --   --   --   --   < > = values in this interval not displayed. Liver Function Tests:  Recent Labs Lab 03/16/13 0230  AST 11  ALT 6  ALKPHOS 91  BILITOT 0.1*  PROT 5.4*  ALBUMIN 2.0*    Recent Labs Lab 03/16/13 0230 03/17/13 0530 03/18/13 0716  LIPASE 1530* 463* 72*  AMYLASE 881*  --   --    No results found for this basename: AMMONIA,  in the last 168 hours CBC:  Recent Labs Lab 03/16/13 0002 03/17/13 0530  WBC 23.6* 13.1*  HGB 16.3 12.9*  HCT 44.1 34.9*  MCV 77.9* 77.9*  PLT 271 172   Cardiac Enzymes:  Recent Labs Lab 03/16/13 0030 03/16/13 0755  TROPONINI <0.30  <0.30   BNP (last 3 results) No results found for this basename: PROBNP,  in the last 8760 hours CBG:  Recent Labs Lab 03/21/13 1658 03/21/13 2124 03/22/13 0749 03/22/13 1135 03/22/13 1631  GLUCAP 207* 260* 250* 253* 276*    Recent Results (from the past 240 hour(s))  MRSA PCR SCREENING     Status: None   Collection Time    03/15/13 11:47 PM      Result Value Range Status   MRSA by PCR NEGATIVE  NEGATIVE Final   Comment:            The GeneXpert MRSA Assay (FDA     approved for NASAL specimens     only), is one component of a     comprehensive MRSA colonization     surveillance program. It is not     intended to diagnose MRSA     infection nor to guide or     monitor treatment for     MRSA infections.  CULTURE, BLOOD (ROUTINE X 2)     Status: None   Collection Time    03/16/13 12:11 AM      Result Value Range Status   Specimen Description BLOOD RIGHT ARM   Final   Special Requests BOTTLES DRAWN AEROBIC AND ANAEROBIC 10CC EACH   Final   Culture  Setup Time 03/16/2013 09:26   Final   Culture NO GROWTH 5 DAYS   Final   Report Status 03/22/2013 FINAL   Final  CULTURE, BLOOD (ROUTINE X 2)     Status: None   Collection Time    03/16/13 12:30 AM      Result Value Range Status   Specimen Description BLOOD RIGHT HAND   Final   Special Requests BOTTLES DRAWN AEROBIC ONLY 10CC   Final   Culture  Setup Time 03/16/2013 09:26   Final   Culture NO GROWTH 5 DAYS   Final   Report Status 03/22/2013 FINAL   Final  URINE CULTURE     Status: None   Collection Time    03/16/13  2:19 AM      Result Value Range Status   Specimen  Description URINE, CLEAN CATCH   Final   Special Requests NONE   Final   Culture  Setup Time 03/16/2013 03:34   Final   Colony Count NO GROWTH   Final   Culture NO GROWTH   Final   Report Status 03/17/2013 FINAL   Final     Studies: No results found.  Scheduled Meds: . amoxicillin-clavulanate  1 tablet Oral Q12H  . antiseptic oral rinse  15 mL Mouth  Rinse q12n4p  . bd getting started take home kit  1 kit Other Once  . enoxaparin (LOVENOX) injection  40 mg Subcutaneous Q24H  . [START ON 03/23/2013] insulin aspart protamine- aspart  28 Units Subcutaneous BID WC  . insulin NPH  6 Units Subcutaneous Once  . living well with diabetes book   Does not apply Once  . metFORMIN  500 mg Oral BID WC  . nystatin  5 mL Oral QID  . nystatin cream   Topical BID  . pantoprazole  40 mg Oral Daily   Continuous Infusions:    Active Problems:   Diabetes mellitus, new onset   DKA (diabetic ketoacidoses)   Acute encephalopathy   OSA (obstructive sleep apnea)   Dental abscess/recent triple root canal   Dehydration   Hypokalemia    Time spent: 30 min    Tatiyana Foucher  [facility] Pager [phone]. If 7PM-7AM, please contact night-coverage at [email or web address], password [facility] 03/22/2013, 9:04 PM  LOS: 7 days

## 2013-03-22 NOTE — Progress Notes (Signed)
Pt places self on and off

## 2013-03-22 NOTE — Progress Notes (Signed)
Pt places self on/off cpap/ 

## 2013-03-22 NOTE — Plan of Care (Signed)
Problem: Food- and Nutrition-Related Knowledge Deficit (NB-1.1) Goal: Nutrition education Formal process to instruct or train a patient/client in a skill or to impart knowledge to help patients/clients voluntarily manage or modify food choices and eating behavior to maintain or improve health.  Outcome: Completed/Met Date Met:  03/22/13  RD consulted for nutrition education regarding diabetes.     Lab Results  Component Value Date    HGBA1C 14.4* 03/17/2013    RD met with pt to reinforce previous education for diabetes management.  Pt with questions r/t to diabetes management which are answered.  Pt is able to correctly identify sources of carbohydrate in his diet and is currently working on meal building.  Pt is familiar with portion size exercises and is able to discuss ways to make meals more nutritionally complete and balanced.  Discussed pt's home schedule which includes working 2 jobs.  Practice meal and snack rotations that could provide pt with adequate CHO, energy, and protein throughout the day.   Discussed importance of controlled and consistent carbohydrate intake throughout the day. Provided examples of ways to balance meals/snacks and encouraged intake of high-fiber, whole grain complex carbohydrates. Teach back method used.  Expect good compliance.  Body mass index is 29.22 kg/(m^2). Pt meets criteria for overweight based on current BMI.  Current diet order is CHO Modified, patient is consuming approximately 75% of meals at this time. Labs and medications reviewed. No further nutrition interventions warranted at this time. RD contact information provided. If additional nutrition issues arise, please re-consult RD.  Loyce Dys, MS RD LDN Clinical Inpatient Dietitian Pager: [phone] Weekend/After hours pager: [phone]

## 2013-03-23 ENCOUNTER — Inpatient Hospital Stay (HOSPITAL_COMMUNITY): Payer: MEDICAID

## 2013-03-23 DIAGNOSIS — R509 Fever, unspecified: Secondary | ICD-10-CM

## 2013-03-23 DIAGNOSIS — R109 Unspecified abdominal pain: Secondary | ICD-10-CM

## 2013-03-23 DIAGNOSIS — R319 Hematuria, unspecified: Secondary | ICD-10-CM

## 2013-03-23 LAB — GLUCOSE, CAPILLARY
Glucose-Capillary: 233 mg/dL — ABNORMAL HIGH (ref 70–99)
Glucose-Capillary: 174 mg/dL — ABNORMAL HIGH (ref 70–99)

## 2013-03-23 LAB — CBC
HCT: 31.9 % — ABNORMAL LOW (ref 39.0–52.0)
Hemoglobin: 10.9 g/dL — ABNORMAL LOW (ref 13.0–17.0)
MCH: 29.1 pg (ref 26.0–34.0)
MCHC: 34.2 g/dL (ref 30.0–36.0)
MCV: 85.1 fL (ref 78.0–100.0)
Platelets: 294 10*3/uL (ref 150–400)
RBC: 3.75 MIL/uL — ABNORMAL LOW (ref 4.22–5.81)
RDW: 13.5 % (ref 11.5–15.5)
WBC: 13.7 10*3/uL — ABNORMAL HIGH (ref 4.0–10.5)

## 2013-03-23 LAB — URINALYSIS W MICROSCOPIC + REFLEX CULTURE
Bilirubin Urine: NEGATIVE
Glucose, UA: 500 mg/dL — AB
Ketones, ur: 15 mg/dL — AB
pH: 6 (ref 5.0–8.0)

## 2013-03-23 LAB — HEPATIC FUNCTION PANEL
AST: 23 U/L (ref 0–37)
Albumin: 2.4 g/dL — ABNORMAL LOW (ref 3.5–5.2)
Alkaline Phosphatase: 88 U/L (ref 39–117)
Bilirubin, Direct: <0.1 mg/dL (ref 0.0–0.3)
Total Bilirubin: 0.3 mg/dL (ref 0.3–1.2)

## 2013-03-23 MED ORDER — VANCOMYCIN HCL IN DEXTROSE 1-5 GM/200ML-% IV SOLN
1000.0000 mg | Freq: Three times a day (TID) | INTRAVENOUS | Status: DC
  Administered 2013-03-23 – 2013-03-25 (×5): 1000 mg via INTRAVENOUS
  Filled 2013-03-23 (×8): qty 200

## 2013-03-23 MED ORDER — PIPERACILLIN-TAZOBACTAM 3.375 G IVPB
3.3750 g | Freq: Three times a day (TID) | INTRAVENOUS | Status: DC
  Administered 2013-03-23 – 2013-03-25 (×6): 3.375 g via INTRAVENOUS
  Filled 2013-03-23 (×8): qty 50

## 2013-03-23 NOTE — Progress Notes (Signed)
ANTIBIOTIC CONSULT NOTE - INITIAL  Pharmacy Consult for vancomycin Indication: rule out pneumonia  Allergies  Allergen Reactions  . Clindamycin/Lincomycin     Patient Measurements: Height: 5\' 9"  (175.3 cm) Weight: 199 lb 8.3 oz (90.5 kg) IBW/kg (Calculated) : 70.7  Vital Signs: Temp: 99.6 F (37.6 C) (07/23 1429) Temp src: Oral (07/23 1429) BP: 136/81 mmHg (07/23 1429) Pulse Rate: 85 (07/23 1429) Intake/Output from previous day: 07/22 0701 - 07/23 0700 In: 240 [P.O.:240] Out: 975 [Urine:975] Intake/Output from this shift: Total I/O In: 840 [P.O.:840] Out: 400 [Urine:400]  Labs:  Recent Labs  03/21/13 0455 03/21/13 0803 03/22/13 0540 03/23/13 0135  WBC  --   --   --  13.7*  HGB  --   --   --  10.9*  PLT  --   --   --  294  CREATININE 0.61 0.56 0.60  --    Estimated Creatinine Clearance: 128.3 ml/min (by C-G formula based on Cr of 0.6). No results found for this basename: VANCOTROUGH, Leodis Binet, VANCORANDOM, GENTTROUGH, GENTPEAK, GENTRANDOM, TOBRATROUGH, TOBRAPEAK, TOBRARND, AMIKACINPEAK, AMIKACINTROU, AMIKACIN,  in the last 72 hours   Microbiology: Recent Results (from the past 720 hour(s))  MRSA PCR SCREENING     Status: None   Collection Time    03/15/13 11:47 PM      Result Value Range Status   MRSA by PCR NEGATIVE  NEGATIVE Final   Comment:            The GeneXpert MRSA Assay (FDA     approved for NASAL specimens     only), is one component of a     comprehensive MRSA colonization     surveillance program. It is not     intended to diagnose MRSA     infection nor to guide or     monitor treatment for     MRSA infections.  CULTURE, BLOOD (ROUTINE X 2)     Status: None   Collection Time    03/16/13 12:11 AM      Result Value Range Status   Specimen Description BLOOD RIGHT ARM   Final   Special Requests BOTTLES DRAWN AEROBIC AND ANAEROBIC 10CC EACH   Final   Culture  Setup Time 03/16/2013 09:26   Final   Culture NO GROWTH 5 DAYS   Final   Report  Status 03/22/2013 FINAL   Final  CULTURE, BLOOD (ROUTINE X 2)     Status: None   Collection Time    03/16/13 12:30 AM      Result Value Range Status   Specimen Description BLOOD RIGHT HAND   Final   Special Requests BOTTLES DRAWN AEROBIC ONLY 10CC   Final   Culture  Setup Time 03/16/2013 09:26   Final   Culture NO GROWTH 5 DAYS   Final   Report Status 03/22/2013 FINAL   Final  URINE CULTURE     Status: None   Collection Time    03/16/13  2:19 AM      Result Value Range Status   Specimen Description URINE, CLEAN CATCH   Final   Special Requests NONE   Final   Culture  Setup Time 03/16/2013 03:34   Final   Colony Count NO GROWTH   Final   Culture NO GROWTH   Final   Report Status 03/17/2013 FINAL   Final    Medical History: Past Medical History  Diagnosis Date  . Sleep apnea     Medications:  Prescriptions prior  to admission  Medication Sig Dispense Refill  . POTASSIUM CHLORIDE PO Take 1 tablet by mouth daily.       Assessment: 46 yom initially presented to the hospital with DKA. Had received antibiotic treatment for a dental abscess but now broadening coverage to vancomycin + zosyn for pneumonia. Pt is currently afebrile and WBC is mildly elevated at 13.7. Pt has excellent renal function.   Vanc 7/23>> Zosyn 7/16>>7/19; 7/23>> Augmentin 7/19>>7/23  7/16 Blood - NEG 7/16 Urine - NEG 7/15 MRSA - NEG  Goal of Therapy:  Vancomycin trough level 15-20 mcg/ml  Plan:  1. Vancomycin 1gm IV Q8H  2. Zosyn 3.375gm IV Q8H (4 hr inf) per MD 3. F/u renal fxn, C&S, clinical status and trough at [facility], Drake Leach 03/23/2013,5:17 PM

## 2013-03-23 NOTE — Progress Notes (Signed)
Reinforced diabetic education with patient. Pt checked his own blood sugar without difficulty. Pt drew up his own insulin, but needed some assistance with the correct dosage. Pt's dose was 6 units and pt initially drew up 7 units. States he still has some blurry vision at present. Pt did very well and explained what he was doing and why he was doing it throughout the entire process. Will continue to educate patient. C.Rodriguez Aguinaldo, RN.

## 2013-03-23 NOTE — Progress Notes (Signed)
Pt notified this RN of some blood in his urine. Pt stated he accidentally flushed the toilet, but managed to urinate a little bit in the urinal to show me. Urine did not appear bloody, but did look deep orange in color. Pt denies urinary symptoms - no frequency, burning, pain, etc. MD on call notified. Will monitor. C.Chloey Ricard, RN.

## 2013-03-23 NOTE — Discharge Summary (Signed)
Physician Discharge Summary  Johnny Nichols ZOX:[id number] DOB: 1966-01-10 DOA: 03/15/2013  PCP: Provider Not In System  Admit date: 03/15/2013 Discharge date: 03/25/2013 Recommendations for Outpatient Follow-up:  1. Pt will need to follow up with PCP in 2 weeks post discharge 2. Please obtain BMP to evaluate electrolytes and kidney function 3. Please also check CBC to evaluate Hg and Hct levels 4. Dr. Ihor Gully in 2-4 weeks  Discharge Diagnoses:  Active Problems:   Diabetes mellitus, new onset   DKA (diabetic ketoacidoses)   Acute encephalopathy   OSA (obstructive sleep apnea)   Dental abscess/recent triple root canal   Dehydration   Hypokalemia   Blurry vision, bilateral   Hematuria Diabetes mellitus, new onset/DKA (diabetic ketoacidoses)  -A1c 14.4.  - 70/30 insulin 22 units BID >> increased to 30 units BID because of persistent hyperglycemia during day today  - Continue metformin 500 g twice a day - extensive education in process re: injections and glucometer use. -Patient was able to demonstrate proper use of glucometer and injection of insulin  - CM following re: MATCH--information & paperwork were provided to the patient by case management -Patient was already provided a glucometer with lancets and test strips by his brother who is a pharmacist Blurry vision likely due to recurrent DKA, improving  - Was still not able to see syringe well enough to accurately pull up insulin and was off by several units. Was also having difficulty getting blood on the test strip. Vision appears to be getting better--patient was able to demonstrate acquisition of insulin properly  -Notified by nursing staff the patient was able to get blood testing strip without assistance  Hematuria/abdominal pain -CT abdomen and pelvis revealed inflammatory stranding of the right retroperitoneal area near the right ureter concerning for blood versus urinary extravasation versus appendicitis -Gen. surgery  was consulted and did not feel the patient required any surgery and that there was no appendicitis -The patient was observed clinically. He tolerated his diet without any worsening pain -CT also suggested left lower lobe infiltrate -pt will be d/ced with 5 additional days of levofloxacin 750mg  daily to finish 7 day course of abx -In conjunction with the patient's worsening leukocytosis empiric antibiotics were started- -WBC count gradually decreased --> 10.9 on the day of discharge  -Urology was consulted to see the patient--Dr. Vernie Ammons felt that the right retroperitoneal area was less likely bleeding or infection--he felt that this was possibly an area of fibrosis versus inflammation in that the patient may benefit from an outpatient retrograde pyelogram and cystoscopy. He recommended outpatient followup after discharge  -Patient's hematuria gradually improved by the time of discharge  -Reported to the RN evening 03/22/2013, blood urine was flushed prior to visualization  -Urinalysis-reveals TNTC RBC, 100protein--no pyuria to suggest UTI  -On the day of discharge, patient's abdominal pain continued to improve. He tolerated his diet without difficulty.  Pulmonary infiltrates/HAP  -Continue empiric vancomycin and Zosyn  -Followup blood cultures  Diarrhea  -C. difficile PCR negative  -Likely related to antibiotics  -Abdominal pain improving  Acute encephalopathy, resolved.  Dehydration resolved.  OSA (obstructive sleep apnea) stable. Continue CPAP  Dental abscess/recent triple root canal,  -Patient was started on Augmentin 03/19/2013.  -pt will be d/ced with 5 additional days of levofloxacin 750mg  daily to finish 7 day course of abx which was prescribed originally for his LLL infiltrate Candidal dermatitis on scrotum/penis, improving, Continue nystatin--improved  Diet: Diabetic diet  Access: PIV  IVF: OFF  Proph: lovenox  Code Status: Full  Family Communication: spoke with patient alone   Disposition Plan: home when medically stable  Antibiotics:  Vancomycin 03/23/2013>>>  03/25/2013 Zosyn 03/23/2013>>>03/25/2013    Discharge Condition: stable  Disposition: home Follow-up Information   Follow up with [facility]    . (Appointment Thursday, 7/24, @ 1:00 p.m. with Dr Standley Dakins  )    Contact information:   [address] [place] [address]       Call Garnett Farm, MD. (When she returned home please call my office to schedule an appointment for followup in the next week or two.)    Contact information:   [address] [place] [address] [phone]       Diet:1800-2000 calorie ADA Wt Readings from Last 3 Encounters:  03/24/13 90.501 kg (199 lb 8.3 oz)    History of present illness:  47 yo with past medical history of OSA brought to [facility] ED with complaints of weakness and confusion. Reports root canal procedure 5 days prior to presentation for after which he was prescribed Clindamycin and Oxycodone. Antibiotics completed 2 days prior to presentation. On 03/15/2013, he presented to urgent care with dyspnea for which he was treated with bronchodilator and steroid and discharged. In ED hyperglycemic, acidotic, hypothermic. PCCM was consulted and patient was transferred to [facility] for further management. Patient was admitted for diabetic ketoacidosis and started on intravenous insulin. The patient was admitted to the ICU. He was stabilized and transitioned to subcutaneous insulin. He was moved out of the ICU, and the hospitalist service assumed care on 03/19/2013. Because of the patient's financial concerns, the patient was started on 70/30 insulin. The patient has been seen by the diabetic educator and provided education regarding his insulin injections. He was seen by the dietitian and provided additional information. The patient's hospital stay has been prolonged do to his transient blurred vision from his hyperglycemia  which has gradually improved. The patient was able to demonstrate the ability to drop the proper amount of insulin prior to discharge.    Consultants: CCM [facility] assumed care 7/19  Discharge Exam: Filed Vitals:   03/25/13 0930  BP: 132/79  Pulse: 87  Temp: 98 F (36.7 C)  Resp: 18   Filed Vitals:   03/24/13 1730 03/24/13 2039 03/25/13 0500 03/25/13 0930  BP: 130/83 114/70 127/77 132/79  Pulse: 88 90 84 87  Temp: 99 F (37.2 C) 98.8 F (37.1 C) 97.4 F (36.3 C) 98 F (36.7 C)  TempSrc: Oral Oral Oral Oral  Resp: 18 18 19 18   Height:      Weight:  90.501 kg (199 lb 8.3 oz)    SpO2: 96% 99% 96% 96%   General: A&O x 3, NAD, pleasant, cooperative Cardiovascular: RRR, no rub, no gallop, no S3 Respiratory: Left basilar crackles. Rectal to auscultation. No wheezes. Good air movement.  Abdomen:soft, epigastric and left upper quadrant tenderness without any guarding or peritoneal signs, nondistended, positive bowel sounds Extremities: trace edema, No lymphangitis, no petechiae  Discharge Instructions      Discharge Orders   Future Orders Complete By Expires     Diet - low sodium heart healthy  As directed     Discharge instructions  As directed     Comments:      [facility] for appointment for next week Call Dr. Alexia Freestone for appointment in 2-4 weeks    Increase activity slowly  As directed  Medication List         insulin aspart protamine- aspart (70-30) 100 UNIT/ML injection  Commonly known as:  NOVOLOG MIX 70/30  Inject 0.3 mLs (30 Units total) into the skin 2 (two) times daily with a meal.     Insulin Syringe-Needle U-100 28G X 1/2" 0.5 ML Misc  Draw up 30 units (0.3cc) of 70/30 insulin and inject two times a day     levofloxacin 750 MG tablet  Commonly known as:  LEVAQUIN  Take 1 tablet (750 mg total) by mouth daily.     metFORMIN 500 MG tablet  Commonly known as:  GLUCOPHAGE  Take 1 tablet (500 mg total) by mouth 2  (two) times daily with a meal.     POTASSIUM CHLORIDE PO  Take 1 tablet by mouth daily.         The results of significant diagnostics from this hospitalization (including imaging, microbiology, ancillary and laboratory) are listed below for reference.    Significant Diagnostic Studies: Dg Chest Port 1 View  03/15/2013   *RADIOLOGY REPORT*  Clinical Data: Shortness of breath.  PORTABLE CHEST - 1 VIEW  Comparison: None.  Findings: Portable view of the chest was obtained.  The lungs are clear. Heart and mediastinum are within normal limits.  The trachea is midline.  Bony thorax is intact.  IMPRESSION: No acute chest findings.   Original Report Authenticated By: Richarda Overlie, M.D.   Dg Abd Portable 1v  03/16/2013   *RADIOLOGY REPORT*  Clinical Data: Abdominal pain.  PORTABLE ABDOMEN - 1 VIEW  Comparison: None.  Findings:  The bowel gas pattern is unremarkable.  There is no evidence for obstruction or free air.  The axial skeleton is within normal limits.  IMPRESSION: Negative one-view abdomen.   Original Report Authenticated By: Marin Roberts, M.D.     Microbiology: Recent Results (from the past 240 hour(s))  MRSA PCR SCREENING     Status: None   Collection Time    03/15/13 11:47 PM      Result Value Range Status   MRSA by PCR NEGATIVE  NEGATIVE Final   Comment:            The GeneXpert MRSA Assay (FDA     approved for NASAL specimens     only), is one component of a     comprehensive MRSA colonization     surveillance program. It is not     intended to diagnose MRSA     infection nor to guide or     monitor treatment for     MRSA infections.  CULTURE, BLOOD (ROUTINE X 2)     Status: None   Collection Time    03/16/13 12:11 AM      Result Value Range Status   Specimen Description BLOOD RIGHT ARM   Final   Special Requests BOTTLES DRAWN AEROBIC AND ANAEROBIC 10CC EACH   Final   Culture  Setup Time 03/16/2013 09:26   Final   Culture NO GROWTH 5 DAYS   Final   Report Status  03/22/2013 FINAL   Final  CULTURE, BLOOD (ROUTINE X 2)     Status: None   Collection Time    03/16/13 12:30 AM      Result Value Range Status   Specimen Description BLOOD RIGHT HAND   Final   Special Requests BOTTLES DRAWN AEROBIC ONLY 10CC   Final   Culture  Setup Time 03/16/2013 09:26   Final   Culture NO GROWTH  5 DAYS   Final   Report Status 03/22/2013 FINAL   Final  URINE CULTURE     Status: None   Collection Time    03/16/13  2:19 AM      Result Value Range Status   Specimen Description URINE, CLEAN CATCH   Final   Special Requests NONE   Final   Culture  Setup Time 03/16/2013 03:34   Final   Colony Count NO GROWTH   Final   Culture NO GROWTH   Final   Report Status 03/17/2013 FINAL   Final  CULTURE, BLOOD (ROUTINE X 2)     Status: None   Collection Time    03/23/13  6:15 PM      Result Value Range Status   Specimen Description BLOOD RIGHT ARM   Final   Special Requests BOTTLES DRAWN AEROBIC AND ANAEROBIC 10CC   Final   Culture  Setup Time 03/24/2013 00:51   Final   Culture     Final   Value:        BLOOD CULTURE RECEIVED NO GROWTH TO DATE CULTURE WILL BE HELD FOR 5 DAYS BEFORE ISSUING A FINAL NEGATIVE REPORT   Report Status PENDING   Incomplete  CULTURE, BLOOD (ROUTINE X 2)     Status: None   Collection Time    03/23/13  6:30 PM      Result Value Range Status   Specimen Description BLOOD RIGHT HAND   Final   Special Requests BOTTLES DRAWN AEROBIC ONLY 10CC   Final   Culture  Setup Time 03/24/2013 00:51   Final   Culture     Final   Value:        BLOOD CULTURE RECEIVED NO GROWTH TO DATE CULTURE WILL BE HELD FOR 5 DAYS BEFORE ISSUING A FINAL NEGATIVE REPORT   Report Status PENDING   Incomplete  CLOSTRIDIUM DIFFICILE BY PCR     Status: None   Collection Time    03/23/13  9:41 PM      Result Value Range Status   C difficile by pcr NEGATIVE  NEGATIVE Final     Labs: Basic Metabolic Panel:  Recent Labs Lab 03/19/13 1006  03/20/13 2304 03/21/13 0455 03/21/13 0803  03/22/13 0540 03/24/13 0442  NA 133*  < > 136 135 134* 136 138  K 3.2*  < > 3.0* 3.3* 3.2* 3.9 3.6  CL 102  < > 100 102 101 101 100  CO2 13*  < > 23 22 20 27 27   GLUCOSE 311*  < > 173* 203* 238* 284* 196*  BUN 6  < > 4* 5* 5* 5* 6  CREATININE 0.68  < > 0.57 0.61 0.56 0.60 0.67  CALCIUM 9.0  < > 9.4 9.2 9.0 8.9 9.3  MG 2.0  --   --   --   --   --   --   < > = values in this interval not displayed. Liver Function Tests:  Recent Labs Lab 03/23/13 1815 03/24/13 0442  AST 23 15  ALT 26 20  ALKPHOS 88 73  BILITOT 0.3 0.2*  PROT 7.5 6.5  ALBUMIN 2.4* 2.2*    Recent Labs Lab 03/23/13 1815  LIPASE 26   No results found for this basename: AMMONIA,  in the last 168 hours CBC:  Recent Labs Lab 03/23/13 0135 03/24/13 0442 03/25/13 0520  WBC 13.7* 11.8* 10.9*  HGB 10.9* 11.0* 11.4*  HCT 31.9* 32.3* 34.0*  MCV 85.1 86.1 87.6  PLT 294 355 386   Cardiac Enzymes: No results found for this basename: CKTOTAL, CKMB, CKMBINDEX, TROPONINI,  in the last 168 hours BNP: No components found with this basename: POCBNP,  CBG:  Recent Labs Lab 03/23/13 2129 03/24/13 0821 03/24/13 1108 03/24/13 2108 03/25/13 0743  GLUCAP 174* 212* 206* 136* 194*    Time coordinating discharge:  Greater than 30 minutes  Signed:  Merie Wulf, DO [facility] Pager: [phone] 03/25/2013, 11:19 AM

## 2013-03-23 NOTE — Progress Notes (Signed)
CT abdomen and pelvis results reviewed. -Reveals mild right-sided retroperitoneal inflammatory change with differential diagnosis concerning for retroperitoneal hemorrhage versus low-grade appendicitis. Appendix was mildly dilated and caliber of inflammatory change was not centered at the level of the appendix  -General surgery consultation was obtained -Blood cultures x2 sets -Start patient on empiric antibiotics pending surgical consultation -Obtain hepatic enzymes including lipase although clinically patient is now presenting typically of pancreatitis -We'll make the patient n.p.o. pending surgical evaluation  DTat

## 2013-03-23 NOTE — Progress Notes (Signed)
[facility] PROGRESS NOTE  Johnny Nichols ZOX:[id number] DOB: Aug 15, 1966 DOA: 03/15/2013 PCP: Provider Not In System  Assessment/Plan: Diabetes mellitus, new onset/DKA (diabetic ketoacidoses) A1c 14.4. Hyperglycemic today.  - 70/30 insulin 22 units BID >> increased to 28 units BID because of persistent hyperglycemia during day today  - Continue metformin  - extensive education in process re: injections and glucometer use.  - CM following re: MATCH  Blurry vision likely due to recurrent DKA, improving  - Was still not able to see syringe well enough to accurately pull up insulin and was off by several units. Was also having difficulty getting blood on the test strip. Vision appears to be getting better--patient was able to demonstrate acquisition of insulin properly  -Notified by nursing staff the patient was able to get blood testing strip without assistance  hematuria  -Reported to the RN evening 03/22/2013, blood urine was flushed prior to visualization  -Urinalysis-reveals TNTC RBC, 100protein--no pyuria to suggest UTI -CT abdomen and pelvis to rule out nephrolithiasis -May need nephrology/urology consult if hematuria persists Acute encephalopathy, resolved.  Dehydration resolved.  OSA (obstructive sleep apnea) stable. Continue CPAP  Dental abscess/recent triple root canal, Continue augmentin  -Patient was started on Augmentin 03/19/2013. He will finish a ten-day course.  Candidal dermatitis on scrotum/penis, improving, Continue nystatin  Diet: Diabetic diet  Access: PIV  IVF: OFF  Proph: lovenox  Code Status: Full  Family Communication: spoke with patient alone  Disposition Plan: home tomorrow AM      Procedures/Studies: Dg Chest Port 1 View  03/15/2013   *RADIOLOGY REPORT*  Clinical Data: Shortness of breath.  PORTABLE CHEST - 1 VIEW  Comparison: None.  Findings: Portable view of the chest was obtained.  The lungs are clear. Heart and mediastinum are within normal  limits.  The trachea is midline.  Bony thorax is intact.  IMPRESSION: No acute chest findings.   Original Report Authenticated By: Richarda Overlie, M.D.   Dg Abd Portable 1v  03/16/2013   *RADIOLOGY REPORT*  Clinical Data: Abdominal pain.  PORTABLE ABDOMEN - 1 VIEW  Comparison: None.  Findings:  The bowel gas pattern is unremarkable.  There is no evidence for obstruction or free air.  The axial skeleton is within normal limits.  IMPRESSION: Negative one-view abdomen.   Original Report Authenticated By: Marin Roberts, M.D.         Subjective: Patient complaint of new onset hematuria last night. Denies any dysuria, frequency, fevers, chills, shortness of breath. He has some right flank discomfort and right upper quadrant discomfort. Denies any nausea, vomiting, diarrhea, hematochezia, melena.  Objective: Filed Vitals:   03/22/13 1630 03/22/13 2205 03/23/13 0444 03/23/13 0931  BP: 144/79 139/82 124/83 149/87  Pulse: 93 97 81 97  Temp: 99.3 F (37.4 C) 99.9 F (37.7 C) 98.9 F (37.2 C) 99.1 F (37.3 C)  TempSrc:  Oral Oral Oral  Resp: 18 17 20 20   Height:  5\' 9"  (1.753 m)    Weight:  90.5 kg (199 lb 8.3 oz)    SpO2: 96% 95% 97% 98%    Intake/Output Summary (Last 24 hours) at 03/23/13 1121 Last data filed at 03/23/13 0933  Gross per 24 hour  Intake    600 ml  Output    975 ml  Net   -375 ml   Weight change: 0.7 kg (1 lb 8.7 oz) Exam:   General:  Pt is alert, follows commands appropriately, not in acute distress  HEENT: No icterus, No thrush, [place]/AT  Cardiovascular: RRR, S1/S2, no rubs, no gallops  Respiratory: CTA bilaterally, no wheezing, no crackles, no rhonchi  Abdomen: Soft/+BS, moderate upper quadrant tenderness without any guarding or peritoneal signs, non distended, no guarding  Extremities: No edema, No lymphangitis, No petechiae, No rashes, no synovitis  Genitals: Negative for phimosis or paraphimosis, no ulcerative lesions noted on the penis or scrotum; no  visible blood;No inguinal adenopathy  Data Reviewed: Basic Metabolic Panel:  Recent Labs Lab 03/19/13 1006  03/20/13 1911 03/20/13 2304 03/21/13 0455 03/21/13 0803 03/22/13 0540  NA 133*  < > 136 136 135 134* 136  K 3.2*  < > 2.9* 3.0* 3.3* 3.2* 3.9  CL 102  < > 101 100 102 101 101  CO2 13*  < > 22 23 22 20 27   GLUCOSE 311*  < > 162* 173* 203* 238* 284*  BUN 6  < > 5* 4* 5* 5* 5*  CREATININE 0.68  < > 0.57 0.57 0.61 0.56 0.60  CALCIUM 9.0  < > 9.2 9.4 9.2 9.0 8.9  MG 2.0  --   --   --   --   --   --   < > = values in this interval not displayed. Liver Function Tests: No results found for this basename: AST, ALT, ALKPHOS, BILITOT, PROT, ALBUMIN,  in the last 168 hours  Recent Labs Lab 03/17/13 0530 03/18/13 0716  LIPASE 463* 72*   No results found for this basename: AMMONIA,  in the last 168 hours CBC:  Recent Labs Lab 03/17/13 0530 03/23/13 0135  WBC 13.1* 13.7*  HGB 12.9* 10.9*  HCT 34.9* 31.9*  MCV 77.9* 85.1  PLT 172 294   Cardiac Enzymes: No results found for this basename: CKTOTAL, CKMB, CKMBINDEX, TROPONINI,  in the last 168 hours BNP: No components found with this basename: POCBNP,  CBG:  Recent Labs Lab 03/21/13 2124 03/22/13 0749 03/22/13 1135 03/22/13 1631 03/23/13 0752  GLUCAP 260* 250* 253* 276* 218*    Recent Results (from the past 240 hour(s))  MRSA PCR SCREENING     Status: None   Collection Time    03/15/13 11:47 PM      Result Value Range Status   MRSA by PCR NEGATIVE  NEGATIVE Final   Comment:            The GeneXpert MRSA Assay (FDA     approved for NASAL specimens     only), is one component of a     comprehensive MRSA colonization     surveillance program. It is not     intended to diagnose MRSA     infection nor to guide or     monitor treatment for     MRSA infections.  CULTURE, BLOOD (ROUTINE X 2)     Status: None   Collection Time    03/16/13 12:11 AM      Result Value Range Status   Specimen Description BLOOD  RIGHT ARM   Final   Special Requests BOTTLES DRAWN AEROBIC AND ANAEROBIC 10CC EACH   Final   Culture  Setup Time 03/16/2013 09:26   Final   Culture NO GROWTH 5 DAYS   Final   Report Status 03/22/2013 FINAL   Final  CULTURE, BLOOD (ROUTINE X 2)     Status: None   Collection Time    03/16/13 12:30 AM      Result Value Range Status   Specimen Description BLOOD RIGHT HAND   Final   Special Requests  BOTTLES DRAWN AEROBIC ONLY 10CC   Final   Culture  Setup Time 03/16/2013 09:26   Final   Culture NO GROWTH 5 DAYS   Final   Report Status 03/22/2013 FINAL   Final  URINE CULTURE     Status: None   Collection Time    03/16/13  2:19 AM      Result Value Range Status   Specimen Description URINE, CLEAN CATCH   Final   Special Requests NONE   Final   Culture  Setup Time 03/16/2013 03:34   Final   Colony Count NO GROWTH   Final   Culture NO GROWTH   Final   Report Status 03/17/2013 FINAL   Final     Scheduled Meds: . amoxicillin-clavulanate  1 tablet Oral Q12H  . antiseptic oral rinse  15 mL Mouth Rinse q12n4p  . bd getting started take home kit  1 kit Other Once  . insulin aspart protamine- aspart  28 Units Subcutaneous BID WC  . living well with diabetes book   Does not apply Once  . metFORMIN  500 mg Oral BID WC  . nystatin  5 mL Oral QID  . nystatin cream   Topical BID  . pantoprazole  40 mg Oral Daily   Continuous Infusions:    Rabiah Goeser, DO  [facility] Pager [phone]  If 7PM-7AM, please contact night-coverage [email or web address] Password [email or web address] 03/23/2013, 11:21 AM   LOS: 8 days

## 2013-03-23 NOTE — Consult Note (Signed)
Reason for Consult:Abnormal CT of the abdomen, possible retroperitoneal bleeding, possible appendicitis Referring Physician: Dr. [name] is an 47 y.o. male.  HPI: The patient has been in the hospital for eight days, initially treating a severe abscess of his oral cavity, and subsequently the patient was found to be a diabetic which is undergoing treatment.  He did go into DKA.  Developed some hematuria which is why a CT of the abdomen was performed.  This showed some stranding in the right pericolic area and retroperitoneum, not specific for any one p rocess, but possibly mild bleeding or early acute appendicitis.  He has has low grade fevers for days.    He has abdominal discomfort, and has has some discomfort since he has been admitted.  Most of his pain is in the LUQ and the right CVA region.  Minimal to no RLQ pain or tenderness.  We were asked to se him for the abnormal CT findings.  Past Medical History  Diagnosis Date  . Sleep apnea     History reviewed. No pertinent past surgical history.  History reviewed. No pertinent family history.  Social History:  reports that he has never smoked. He does not have any smokeless tobacco history on file. He reports that  drinks alcohol. His drug history is not on file.  Allergies:  Allergies  Allergen Reactions  . Clindamycin/Lincomycin     Medications: I have reviewed the patient's current medications.  Results for orders placed during the hospital encounter of 03/15/13 (from the past 48 hour(s))  GLUCOSE, CAPILLARY     Status: Abnormal   Collection Time    03/21/13  9:24 PM      Result Value Range   Glucose-Capillary 260 (*) 70 - 99 mg/dL  BASIC METABOLIC PANEL     Status: Abnormal   Collection Time    03/22/13  5:40 AM      Result Value Range   Sodium 136  135 - 145 mEq/L   Potassium 3.9  3.5 - 5.1 mEq/L   Comment: DELTA CHECK NOTED   Chloride 101  96 - 112 mEq/L   CO2 27  19 - 32 mEq/L   Glucose, Bld 284 (*) 70  - 99 mg/dL   BUN 5 (*) 6 - 23 mg/dL   Creatinine, Ser 1.61  0.50 - 1.35 mg/dL   Calcium 8.9  8.4 - 09.6 mg/dL   GFR calc non Af Amer >90  >90 mL/min   GFR calc Af Amer >90  >90 mL/min   Comment:            The eGFR has been calculated     using the CKD EPI equation.     This calculation has not been     validated in all clinical     situations.     eGFR's persistently     <90 mL/min signify     possible Chronic Kidney Disease.  GLUCOSE, CAPILLARY     Status: Abnormal   Collection Time    03/22/13  7:49 AM      Result Value Range   Glucose-Capillary 250 (*) 70 - 99 mg/dL  GLUCOSE, CAPILLARY     Status: Abnormal   Collection Time    03/22/13 11:35 AM      Result Value Range   Glucose-Capillary 253 (*) 70 - 99 mg/dL  GLUCOSE, CAPILLARY     Status: Abnormal   Collection Time    03/22/13  4:31 PM  Result Value Range   Glucose-Capillary 276 (*) 70 - 99 mg/dL  CBC     Status: Abnormal   Collection Time    03/23/13  1:35 AM      Result Value Range   WBC 13.7 (*) 4.0 - 10.5 K/uL   RBC 3.75 (*) 4.22 - 5.81 MIL/uL   Hemoglobin 10.9 (*) 13.0 - 17.0 g/dL   HCT 16.1 (*) 09.6 - 04.5 %   MCV 85.1  78.0 - 100.0 fL   MCH 29.1  26.0 - 34.0 pg   MCHC 34.2  30.0 - 36.0 g/dL   RDW 40.9  81.1 - 91.4 %   Platelets 294  150 - 400 K/uL  GLUCOSE, CAPILLARY     Status: Abnormal   Collection Time    03/23/13  7:52 AM      Result Value Range   Glucose-Capillary 218 (*) 70 - 99 mg/dL   Comment 1 Documented in Chart    URINALYSIS W MICROSCOPIC + REFLEX CULTURE     Status: Abnormal   Collection Time    03/23/13 10:01 AM      Result Value Range   Color, Urine AMBER (*) YELLOW   Comment: BIOCHEMICALS MAY BE AFFECTED BY COLOR   APPearance CLOUDY (*) CLEAR   Specific Gravity, Urine 1.016  1.005 - 1.030   pH 6.0  5.0 - 8.0   Glucose, UA 500 (*) NEGATIVE mg/dL   Hgb urine dipstick LARGE (*) NEGATIVE   Bilirubin Urine NEGATIVE  NEGATIVE   Ketones, ur 15 (*) NEGATIVE mg/dL   Protein, ur 782  (*) NEGATIVE mg/dL   Urobilinogen, UA 0.2  0.0 - 1.0 mg/dL   Nitrite NEGATIVE  NEGATIVE   Leukocytes, UA NEGATIVE  NEGATIVE   WBC, UA 0-2  <3 WBC/hpf   RBC / HPF TOO NUMEROUS TO COUNT  <3 RBC/hpf   Bacteria, UA RARE  RARE   Squamous Epithelial / LPF FEW (*) RARE  GLUCOSE, CAPILLARY     Status: Abnormal   Collection Time    03/23/13 11:38 AM      Result Value Range   Glucose-Capillary 194 (*) 70 - 99 mg/dL   Comment 1 Documented in Chart    GLUCOSE, CAPILLARY     Status: Abnormal   Collection Time    03/23/13  4:33 PM      Result Value Range   Glucose-Capillary 233 (*) 70 - 99 mg/dL   Comment 1 Notify RN      Ct Abdomen Pelvis Wo Contrast  03/23/2013   *RADIOLOGY REPORT*  Clinical Data: Hematuria  CT ABDOMEN AND PELVIS WITHOUT CONTRAST  Technique:  Multidetector CT imaging of the abdomen and pelvis was performed following the standard protocol without intravenous contrast.  Comparison: None.  Findings: There may be a punctate calculus in the upper pole collecting system of the left kidney measuring only 1-2 mm.  There is otherwise no evidence of renal, ureteral or bladder calculi.  No hydronephrosis is identified.  There is stranding and edema in the right retroperitoneum beginning in the lower abdomen just above the iliac crests and extending inferiorly into the pelvis predominately along the anterior aspect of the iliopsoas muscle.  This is in the vicinity of the right ureter.  Differential considerations include urinary extravasation, hemorrhage, inflammation from infection or fibrosis.  There is suggestion of mild stranding in the peripancreatic fat in the region of the body and tail of the pancreas.  This could represent change related to pancreatitis.  The additional stranding in the lower right retroperitoneum could be some extension of inflammatory change from pancreatitis into the pelvis.  However, no similar changes are noted on the left.  No focal pseudocyst or abscess is identified.   The appendix is mildly dilated in caliber, measuring up to 10 mm in diameter.  Some of the right sided retroperitoneal inflammatory change does nearly abut the appendix, but is not centered at the level of the appendix.  A low grade appendicitis cannot be excluded by unenhanced CT.  No definite enlarged lymph nodes are seen.  The visualized lung bases show a patchy airspace infiltrate of the posterior left lower lobe suspicious for pneumonia. The liver, spleen and adrenal glands are unremarkable.  The gallbladder is contracted.  Bowel shows no evidence of obstruction or ileus.  The bladder is decompressed and shows some suggestion of potential circumferential wall thickening and.  The prostate gland is not overtly enlarged.  There is a small right inguinal hernia containing fat.  No bony abnormalities are seen.  IMPRESSION:  1.  Subtle inflammation and stranding of the peripancreatic fat at the level of the body and tail of the pancreas.  This could be related to pancreatitis.  There is no evidence to suggest pseudocyst formation or focal abscess. 2. Inflammatory stranding in the right retroperitoneum. Differential considerations as above include inflammatory process, urinary extravasation or hemorrhage.  This could potentially be some extension of pancreatitis into the right retroperitoneum. 3.  Thickening of the appendix.  Low grade appendicitis is not excluded. 4.  Circumferential wall thickening of the bladder is suggested. However, the bladder is underdistended at the time of imaging. 5.  Left lower lobe pneumonia.l   Original Report Authenticated By: Johnny Nichols, M.D.    Review of Systems  Constitutional: Positive for fever (low grade). Negative for chills and diaphoresis.  HENT: Negative.   Gastrointestinal: Positive for diarrhea (has had since admission).  Genitourinary: Positive for hematuria.  Musculoskeletal: Negative.   Skin: Negative.   Neurological: Negative.   Endo/Heme/Allergies:  Negative.   Psychiatric/Behavioral: Negative.    Blood pressure 128/80, pulse 93, temperature 99.6 F (37.6 C), temperature source Oral, resp. rate 18, height 5\' 9"  (1.753 m), weight 90.5 kg (199 lb 8.3 oz), SpO2 98.00%. Physical Exam  Constitutional: He is oriented to person, place, and time. He appears well-developed and well-nourished.  HENT:  Head: Normocephalic and atraumatic.  Eyes: Conjunctivae and EOM are normal. Pupils are equal, round, and reactive to light.  Neck: Normal range of motion. Neck supple.  Cardiovascular: Normal rate, regular rhythm and normal heart sounds.   Respiratory: Effort normal.  GI: Soft. Bowel sounds are normal. He exhibits no distension. There is tenderness in the left upper quadrant. There is CVA tenderness (right). There is no rigidity, no rebound and no guarding.  Genitourinary: Penis normal.  Musculoskeletal: Normal range of motion.  Neurological: He is alert and oriented to person, place, and time. He has normal reflexes.  Skin: Skin is warm and dry.  Psychiatric: He has a normal mood and affect. His behavior is normal. Judgment and thought content normal.    Assessment/Plan: Mild abdominal pain with possible hematuria, and abnormal CT findings. He has also had constant diarrhea since admission.  C.diff has not been checked.  I do not believe that the patient has acute appendicitis. The amount of retroperitoneal stranding and possible bleeding is minimal. No good explanation for hematuria except possible irritation from overlying process.  Constant diarrhea with low grade  fever having been on multiple antibiotic including clindamycin makes me concerned about C.diff colitis.  Have ordered stat C.diff PCR.  Otherwise we will follow, but no surgery needed currently.  Cherylynn Ridges 03/23/2013, 6:04 PM

## 2013-03-24 ENCOUNTER — Ambulatory Visit: Payer: Self-pay | Attending: Family Medicine

## 2013-03-24 DIAGNOSIS — R319 Hematuria, unspecified: Secondary | ICD-10-CM

## 2013-03-24 LAB — GLUCOSE, CAPILLARY
Glucose-Capillary: 213 mg/dL — ABNORMAL HIGH (ref 70–99)
Glucose-Capillary: 206 mg/dL — ABNORMAL HIGH (ref 70–99)

## 2013-03-24 LAB — COMPREHENSIVE METABOLIC PANEL
AST: 15 U/L (ref 0–37)
Albumin: 2.2 g/dL — ABNORMAL LOW (ref 3.5–5.2)
Alkaline Phosphatase: 73 U/L (ref 39–117)
Chloride: 100 mEq/L (ref 96–112)
Creatinine, Ser: 0.67 mg/dL (ref 0.50–1.35)
Potassium: 3.6 mEq/L (ref 3.5–5.1)
Total Bilirubin: 0.2 mg/dL — ABNORMAL LOW (ref 0.3–1.2)
Total Protein: 6.5 g/dL (ref 6.0–8.3)

## 2013-03-24 LAB — CBC
MCHC: 34.1 g/dL (ref 30.0–36.0)
Platelets: 355 10*3/uL (ref 150–400)
RDW: 13.3 % (ref 11.5–15.5)
WBC: 11.8 10*3/uL — ABNORMAL HIGH (ref 4.0–10.5)

## 2013-03-24 MED ORDER — DIPHENHYDRAMINE HCL 25 MG PO CAPS
25.0000 mg | ORAL_CAPSULE | Freq: Four times a day (QID) | ORAL | Status: DC | PRN
  Administered 2013-03-24: 25 mg via ORAL
  Filled 2013-03-24: qty 1

## 2013-03-24 NOTE — Progress Notes (Signed)
[facility] PROGRESS NOTE  Johnny Nichols NGE:[id number] DOB: 02-18-1966 DOA: 03/15/2013 PCP: Provider Not In System  Assessment/Plan: Diabetes mellitus, new onset/DKA (diabetic ketoacidoses)  -A1c 14.4.  - 70/30 insulin 22 units BID >> increased to 28 units BID because of persistent hyperglycemia during day today  - Continue metformin  - extensive education in process re: injections and glucometer use.  - CM following re: MATCH  Blurry vision likely due to recurrent DKA, improving  - Was still not able to see syringe well enough to accurately pull up insulin and was off by several units. Was also having difficulty getting blood on the test strip. Vision appears to be getting better--patient was able to demonstrate acquisition of insulin properly  -Notified by nursing staff the patient was able to get blood testing strip without assistance  hematuria  -Reported to the RN evening 03/22/2013, blood urine was flushed prior to visualization  -Patient continues to have hematuria although less--possible urology given abnormal CT findings -Urinalysis-reveals TNTC RBC, 100protein--no pyuria to suggest UTI  -CT abdomen and pelvis to rule out nephrolithiasis  -May need nephrology/urology consult if hematuria persists  Pulmonary infiltrates/HAP -Continue empiric vancomycin and Zosyn -Followup blood cultures Diarrhea -C. difficile PCR negative -Likely related to antibiotics -Abdominal pain improving Acute encephalopathy, resolved.  Dehydration resolved.  OSA (obstructive sleep apnea) stable. Continue CPAP  Dental abscess/recent triple root canal, Continue augmentin  -Patient was started on Augmentin 03/19/2013. He will finish a ten-day course.  Candidal dermatitis on scrotum/penis, improving, Continue nystatin  Diet: Diabetic diet  Access: PIV  IVF: OFF  Proph: lovenox  Code Status: Full  Family Communication: spoke with patient alone  Disposition Plan: home when medically  stable   Antibiotics:  Vancomycin 03/23/2013>>>  Zosyn 03/23/2013>>>           Procedures/Studies: Ct Abdomen Pelvis Wo Contrast  03/23/2013   *RADIOLOGY REPORT*  Clinical Data: Hematuria  CT ABDOMEN AND PELVIS WITHOUT CONTRAST  Technique:  Multidetector CT imaging of the abdomen and pelvis was performed following the standard protocol without intravenous contrast.  Comparison: None.  Findings: There may be a punctate calculus in the upper pole collecting system of the left kidney measuring only 1-2 mm.  There is otherwise no evidence of renal, ureteral or bladder calculi.  No hydronephrosis is identified.  There is stranding and edema in the right retroperitoneum beginning in the lower abdomen just above the iliac crests and extending inferiorly into the pelvis predominately along the anterior aspect of the iliopsoas muscle.  This is in the vicinity of the right ureter.  Differential considerations include urinary extravasation, hemorrhage, inflammation from infection or fibrosis.  There is suggestion of mild stranding in the peripancreatic fat in the region of the body and tail of the pancreas.  This could represent change related to pancreatitis.  The additional stranding in the lower right retroperitoneum could be some extension of inflammatory change from pancreatitis into the pelvis.  However, no similar changes are noted on the left.  No focal pseudocyst or abscess is identified.  The appendix is mildly dilated in caliber, measuring up to 10 mm in diameter.  Some of the right sided retroperitoneal inflammatory change does nearly abut the appendix, but is not centered at the level of the appendix.  A low grade appendicitis cannot be excluded by unenhanced CT.  No definite enlarged lymph nodes are seen.  The visualized lung bases show a patchy airspace infiltrate of the posterior left lower lobe suspicious for pneumonia. The  liver, spleen and adrenal glands are unremarkable.  The gallbladder  is contracted.  Bowel shows no evidence of obstruction or ileus.  The bladder is decompressed and shows some suggestion of potential circumferential wall thickening and.  The prostate gland is not overtly enlarged.  There is a small right inguinal hernia containing fat.  No bony abnormalities are seen.  IMPRESSION:  1.  Subtle inflammation and stranding of the peripancreatic fat at the level of the body and tail of the pancreas.  This could be related to pancreatitis.  There is no evidence to suggest pseudocyst formation or focal abscess. 2. Inflammatory stranding in the right retroperitoneum. Differential considerations as above include inflammatory process, urinary extravasation or hemorrhage.  This could potentially be some extension of pancreatitis into the right retroperitoneum. 3.  Thickening of the appendix.  Low grade appendicitis is not excluded. 4.  Circumferential wall thickening of the bladder is suggested. However, the bladder is underdistended at the time of imaging. 5.  Left lower lobe pneumonia.l   Original Report Authenticated By: Irish Lack, M.D.   Dg Chest Port 1 View  03/15/2013   *RADIOLOGY REPORT*  Clinical Data: Shortness of breath.  PORTABLE CHEST - 1 VIEW  Comparison: None.  Findings: Portable view of the chest was obtained.  The lungs are clear. Heart and mediastinum are within normal limits.  The trachea is midline.  Bony thorax is intact.  IMPRESSION: No acute chest findings.   Original Report Authenticated By: Richarda Overlie, M.D.   Dg Abd Portable 1v  03/16/2013   *RADIOLOGY REPORT*  Clinical Data: Abdominal pain.  PORTABLE ABDOMEN - 1 VIEW  Comparison: None.  Findings:  The bowel gas pattern is unremarkable.  There is no evidence for obstruction or free air.  The axial skeleton is within normal limits.  IMPRESSION: Negative one-view abdomen.   Original Report Authenticated By: Marin Roberts, M.D.         Subjective:   Objective: Filed Vitals:   03/23/13 1755  03/23/13 2123 03/24/13 0432 03/24/13 0907  BP: 128/80 132/80 128/71 123/68  Pulse: 93 94 78 94  Temp: 99.6 F (37.6 C) 99.4 F (37.4 C) 99.4 F (37.4 C) 98.6 F (37 C)  TempSrc: Oral Oral Oral Oral  Resp: 18 18 18 18   Height:      Weight:      SpO2: 98% 98% 97% 98%    Intake/Output Summary (Last 24 hours) at 03/24/13 1017 Last data filed at 03/24/13 0659  Gross per 24 hour  Intake   1290 ml  Output      1 ml  Net   1289 ml   Weight change:  Exam:   General:  Pt is alert, follows commands appropriately, not in acute distress  HEENT: No icterus, No thrush, No neck mass, [place]/AT  Cardiovascular: RRR, S1/S2, no rubs, no gallops  Respiratory: CTA bilaterally, no wheezing, no crackles, no rhonchi  Abdomen: Soft/+BS, non tender, non distended, no guarding  Extremities: No edema, No lymphangitis, No petechiae, No rashes, no synovitis  Data Reviewed: Basic Metabolic Panel:  Recent Labs Lab 03/19/13 1006  03/20/13 2304 03/21/13 0455 03/21/13 0803 03/22/13 0540 03/24/13 0442  NA 133*  < > 136 135 134* 136 138  K 3.2*  < > 3.0* 3.3* 3.2* 3.9 3.6  CL 102  < > 100 102 101 101 100  CO2 13*  < > 23 22 20 27 27   GLUCOSE 311*  < > 173* 203* 238* 284* 196*  BUN 6  < > 4* 5* 5* 5* 6  CREATININE 0.68  < > 0.57 0.61 0.56 0.60 0.67  CALCIUM 9.0  < > 9.4 9.2 9.0 8.9 9.3  MG 2.0  --   --   --   --   --   --   < > = values in this interval not displayed. Liver Function Tests:  Recent Labs Lab 03/23/13 1815 03/24/13 0442  AST 23 15  ALT 26 20  ALKPHOS 88 73  BILITOT 0.3 0.2*  PROT 7.5 6.5  ALBUMIN 2.4* 2.2*    Recent Labs Lab 03/18/13 0716 03/23/13 1815  LIPASE 72* 26   No results found for this basename: AMMONIA,  in the last 168 hours CBC:  Recent Labs Lab 03/23/13 0135 03/24/13 0442  WBC 13.7* 11.8*  HGB 10.9* 11.0*  HCT 31.9* 32.3*  MCV 85.1 86.1  PLT 294 355   Cardiac Enzymes: No results found for this basename: CKTOTAL, CKMB, CKMBINDEX, TROPONINI,   in the last 168 hours BNP: No components found with this basename: POCBNP,  CBG:  Recent Labs Lab 03/23/13 0752 03/23/13 1138 03/23/13 1633 03/23/13 2129 03/24/13 0821  GLUCAP 218* 194* 233* 174* 212*    Recent Results (from the past 240 hour(s))  MRSA PCR SCREENING     Status: None   Collection Time    03/15/13 11:47 PM      Result Value Range Status   MRSA by PCR NEGATIVE  NEGATIVE Final   Comment:            The GeneXpert MRSA Assay (FDA     approved for NASAL specimens     only), is one component of a     comprehensive MRSA colonization     surveillance program. It is not     intended to diagnose MRSA     infection nor to guide or     monitor treatment for     MRSA infections.  CULTURE, BLOOD (ROUTINE X 2)     Status: None   Collection Time    03/16/13 12:11 AM      Result Value Range Status   Specimen Description BLOOD RIGHT ARM   Final   Special Requests BOTTLES DRAWN AEROBIC AND ANAEROBIC 10CC EACH   Final   Culture  Setup Time 03/16/2013 09:26   Final   Culture NO GROWTH 5 DAYS   Final   Report Status 03/22/2013 FINAL   Final  CULTURE, BLOOD (ROUTINE X 2)     Status: None   Collection Time    03/16/13 12:30 AM      Result Value Range Status   Specimen Description BLOOD RIGHT HAND   Final   Special Requests BOTTLES DRAWN AEROBIC ONLY 10CC   Final   Culture  Setup Time 03/16/2013 09:26   Final   Culture NO GROWTH 5 DAYS   Final   Report Status 03/22/2013 FINAL   Final  URINE CULTURE     Status: None   Collection Time    03/16/13  2:19 AM      Result Value Range Status   Specimen Description URINE, CLEAN CATCH   Final   Special Requests NONE   Final   Culture  Setup Time 03/16/2013 03:34   Final   Colony Count NO GROWTH   Final   Culture NO GROWTH   Final   Report Status 03/17/2013 FINAL   Final  CLOSTRIDIUM DIFFICILE BY PCR  Status: None   Collection Time    03/23/13  9:41 PM      Result Value Range Status   C difficile by pcr NEGATIVE   NEGATIVE Final     Scheduled Meds: . antiseptic oral rinse  15 mL Mouth Rinse q12n4p  . bd getting started take home kit  1 kit Other Once  . insulin aspart protamine- aspart  28 Units Subcutaneous BID WC  . living well with diabetes book   Does not apply Once  . metFORMIN  500 mg Oral BID WC  . nystatin  5 mL Oral QID  . nystatin cream   Topical BID  . pantoprazole  40 mg Oral Daily  . piperacillin-tazobactam (ZOSYN)  IV  3.375 g Intravenous Q8H  . vancomycin  1,000 mg Intravenous Q8H   Continuous Infusions:    Darchelle Nunes, DO  [facility] Pager [phone]  If 7PM-7AM, please contact night-coverage [email or web address] Password [facility] 03/24/2013, 10:17 AM   LOS: 9 days

## 2013-03-24 NOTE — Progress Notes (Signed)
Patient reports having itchiness located in lower back area. Skin is irritated from scratching. Pt request for Benardyl PO or any prn for relief. Donnamarie Poag notified. New orders placed. Gilman Schmidt

## 2013-03-24 NOTE — Care Management Note (Signed)
Pt with appointment at [facility] and [facility] scheduled for today, Johnny Nichols , March 24, 2013 @ 1pm. Appointment cancelled by this CM and message left asking for another appointment to be scheduled for next week. Await return call.  Johny Shock RN MPH Case Manager [phone]

## 2013-03-24 NOTE — Consult Note (Signed)
Urology Consult  CC: Referring physician: Dr. Algis Downs. TAT Reason for referral: Hematuria and abnormal CT scan findings  History of Present Illness: The patient was admitted to the hospital with uncontrolled diabetes. During his hospitalization he developed gross hematuria which began 48 hours ago. He said he initially noted blood at the beginning of his urinary stream and then again at the termination. He now notes blood at the termination of his urinary stream but does not have grossly bloody urine. He has no dysuria but has experienced some increased urinary frequency because he was he's been receiving in the hospital as he is not noticed any increased urgency. He had a single episode of gross hematuria about 20 years ago when he passed a kidney stone. He has no prior history of UTIs or prostatitis. He has not been catheterized during his hospitalization and has never been a smoker. His gross hematuria would be considered of moderate severity with no modifying factors. He also underwent a CT scan that revealed possible inflammatory changes involving the mid right ureter. He said when he came in the hospital he was having some pain in his back bilaterally it was felt to be gas pain and that has now resolved but he continues to have some pain in the left upper quadrant. He has no right upper quadrant or right lower quadrant pain nor any flank pain at this time.  Past Medical History  Diagnosis Date  . Sleep apnea    History reviewed. No pertinent past surgical history.  Medications:  Prior to Admission:  Prescriptions prior to admission  Medication Sig Dispense Refill  . POTASSIUM CHLORIDE PO Take 1 tablet by mouth daily.       Scheduled: . antiseptic oral rinse  15 mL Mouth Rinse q12n4p  . bd getting started take home kit  1 kit Other Once  . insulin aspart protamine- aspart  28 Units Subcutaneous BID WC  . living well with diabetes book   Does not apply Once  . metFORMIN  500 mg Oral BID WC  .  nystatin  5 mL Oral QID  . nystatin cream   Topical BID  . pantoprazole  40 mg Oral Daily  . piperacillin-tazobactam (ZOSYN)  IV  3.375 g Intravenous Q8H  . vancomycin  1,000 mg Intravenous Q8H   Continuous:   Allergies:  Allergies  Allergen Reactions  . Clindamycin/Lincomycin     History reviewed. No pertinent family history.  Social History:  reports that he has never smoked. He does not have any smokeless tobacco history on file. He reports that  drinks alcohol. His drug history is not on file.  Review of Systems: Pertinent items are noted in HPI. A comprehensive review of systems was negative except as noted above  Physical Exam:  Vital signs in last 24 hours: Temp:  [98.6 F (37 C)-99.6 F (37.6 C)] 98.6 F (37 C) (07/24 0907) Pulse Rate:  [78-94] 94 (07/24 0907) Resp:  [18] 18 (07/24 0907) BP: (123-136)/(68-81) 123/68 mmHg (07/24 0907) SpO2:  [97 %-98 %] 98 % (07/24 0907) General appearance: alert and appears stated age Head: Normocephalic, without obvious abnormality, atraumatic Eyes: conjunctivae/corneas clear. EOM's intact.  Oropharynx: moist mucous membranes Neck: supple, symmetrical, trachea midline Resp: normal respiratory effort Cardio: regular rate and rhythm Back: symmetric, no curvature. ROM normal. No CVA tenderness. GI: soft, he is tender in the left upper quadrant but has no peritoneal signs and has no tenderness in the right upper quadrant or right lower quadrant;  bowel sounds normal; no masses,  no organomegaly Male genitalia: penis: normal male phallus with no lesions or discharge.Testes: bilaterally descended with no masses or tenderness. no hernias Extremities: extremities normal, atraumatic, no cyanosis or edema Skin: Skin color normal. No visible rashes or lesions Neurologic: Grossly normal  Laboratory Data:   Recent Labs  03/23/13 0135 03/24/13 0442  WBC 13.7* 11.8*  HGB 10.9* 11.0*  HCT 31.9* 32.3*   BMET  Recent Labs   03/22/13 0540 03/24/13 0442  NA 136 138  K 3.9 3.6  CL 101 100  CO2 27 27  GLUCOSE 284* 196*  BUN 5* 6  CREATININE 0.60 0.67  CALCIUM 8.9 9.3   No results found for this basename: LABPT, INR,  in the last 72 hours No results found for this basename: LABURIN,  in the last 72 hours Results for orders placed during the hospital encounter of 03/15/13  MRSA PCR SCREENING     Status: None   Collection Time    03/15/13 11:47 PM      Result Value Range Status   MRSA by PCR NEGATIVE  NEGATIVE Final   Comment:            The GeneXpert MRSA Assay (FDA     approved for NASAL specimens     only), is one component of a     comprehensive MRSA colonization     surveillance program. It is not     intended to diagnose MRSA     infection nor to guide or     monitor treatment for     MRSA infections.  CULTURE, BLOOD (ROUTINE X 2)     Status: None   Collection Time    03/16/13 12:11 AM      Result Value Range Status   Specimen Description BLOOD RIGHT ARM   Final   Special Requests BOTTLES DRAWN AEROBIC AND ANAEROBIC 10CC EACH   Final   Culture  Setup Time 03/16/2013 09:26   Final   Culture NO GROWTH 5 DAYS   Final   Report Status 03/22/2013 FINAL   Final  CULTURE, BLOOD (ROUTINE X 2)     Status: None   Collection Time    03/16/13 12:30 AM      Result Value Range Status   Specimen Description BLOOD RIGHT HAND   Final   Special Requests BOTTLES DRAWN AEROBIC ONLY 10CC   Final   Culture  Setup Time 03/16/2013 09:26   Final   Culture NO GROWTH 5 DAYS   Final   Report Status 03/22/2013 FINAL   Final  URINE CULTURE     Status: None   Collection Time    03/16/13  2:19 AM      Result Value Range Status   Specimen Description URINE, CLEAN CATCH   Final   Special Requests NONE   Final   Culture  Setup Time 03/16/2013 03:34   Final   Colony Count NO GROWTH   Final   Culture NO GROWTH   Final   Report Status 03/17/2013 FINAL   Final  CLOSTRIDIUM DIFFICILE BY PCR     Status: None    Collection Time    03/23/13  9:41 PM      Result Value Range Status   C difficile by pcr NEGATIVE  NEGATIVE Final   Creatinine:  Recent Labs  03/20/13 1201 03/20/13 1911 03/20/13 2304 03/21/13 0455 03/21/13 0803 03/22/13 0540 03/24/13 0442  CREATININE 0.64 0.57 0.57 0.61 0.56 0.60 0.67  Imaging: Ct Abdomen Pelvis Wo Contrast  03/23/2013   *RADIOLOGY REPORT*  Clinical Data: Hematuria  CT ABDOMEN AND PELVIS WITHOUT CONTRAST  Technique:  Multidetector CT imaging of the abdomen and pelvis was performed following the standard protocol without intravenous contrast.  Comparison: None.  Findings: There may be a punctate calculus in the upper pole collecting system of the left kidney measuring only 1-2 mm.  There is otherwise no evidence of renal, ureteral or bladder calculi.  No hydronephrosis is identified.  There is stranding and edema in the right retroperitoneum beginning in the lower abdomen just above the iliac crests and extending inferiorly into the pelvis predominately along the anterior aspect of the iliopsoas muscle.  This is in the vicinity of the right ureter.  Differential considerations include urinary extravasation, hemorrhage, inflammation from infection or fibrosis.  There is suggestion of mild stranding in the peripancreatic fat in the region of the body and tail of the pancreas.  This could represent change related to pancreatitis.  The additional stranding in the lower right retroperitoneum could be some extension of inflammatory change from pancreatitis into the pelvis.  However, no similar changes are noted on the left.  No focal pseudocyst or abscess is identified.  The appendix is mildly dilated in caliber, measuring up to 10 mm in diameter.  Some of the right sided retroperitoneal inflammatory change does nearly abut the appendix, but is not centered at the level of the appendix.  A low grade appendicitis cannot be excluded by unenhanced CT.  No definite enlarged lymph nodes  are seen.  The visualized lung bases show a patchy airspace infiltrate of the posterior left lower lobe suspicious for pneumonia. The liver, spleen and adrenal glands are unremarkable.  The gallbladder is contracted.  Bowel shows no evidence of obstruction or ileus.  The bladder is decompressed and shows some suggestion of potential circumferential wall thickening and.  The prostate gland is not overtly enlarged.  There is a small right inguinal hernia containing fat.  No bony abnormalities are seen.  IMPRESSION:  1.  Subtle inflammation and stranding of the peripancreatic fat at the level of the body and tail of the pancreas.  This could be related to pancreatitis.  There is no evidence to suggest pseudocyst formation or focal abscess. 2. Inflammatory stranding in the right retroperitoneum. Differential considerations as above include inflammatory process, urinary extravasation or hemorrhage.  This could potentially be some extension of pancreatitis into the right retroperitoneum. 3.  Thickening of the appendix.  Low grade appendicitis is not excluded. 4.  Circumferential wall thickening of the bladder is suggested. However, the bladder is underdistended at the time of imaging. 5.  Left lower lobe pneumonia.l   Original Report Authenticated By: Irish Lack, M.D.    Impression/Assessment:  I reviewed his CT scan images and I do note some stranding in the area of the right retroperitoneum/colic gutter. There is no evidence of hydronephrosis on that side in the patient has no evidence of renal or ureteral calculi. This could be do to some form of inflammation of the ureter although that would be quite unusual to have an isolated segment of the ureter inflamed. Certainly urothelial malignancy is a remote possibility although I would expect to see obstruction of the ureter if he had extensive enough ureteral involvement to cause perinephric stranding. I therefore do not think that this is from a neoplastic  process although that has not been completely ruled out yet. An inflammatory process from an extra-ureteral  etiology certainly could cause the stranding seen on his CT scan as well as hematuria. In my opinion it is highly unlikely that these findings indicate some form of extravasation of urine. Blood is also a possibility although the source of the bleeding is certainly not evident and I think this is also of low probability. In my opinion this appears to be inflammatory versus possibly fibrotic in nature. It does not appear to be infectious in etiology with a negative urine culture. Because of that I would not recommend cystoscopy and retrograde pyelogram at this time. That may be necessary if his hematuria does not clear over time. I would however recommend observation only and if his hematuria clears further evaluation with retrograde pyelography would not be necessary.  His gross hematuria is primarily at the termination of his urination which is most consistent with lower tract bleeding such as from the prostate rather than the kidneys, ureters or bladder. He is not having any difficulty voiding or dysuria and has no infection. He has been on Lovenox at this point it is likely cystoscopic evaluation may be necessary but again I would this further as an outpatient as it may be necessary to perform a retrograde pyelogram and cystoscopy could be performed at that time as well.  He has a history of nephrolithiasis but there was no evidence of stones seen on his CT scan.   Plan:  1. He may be discharged at any time from a urologic standpoint. 2. I have supplied him with my office address and information. 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  Lavere Shinsky C 03/24/2013, 10:06 AM

## 2013-03-24 NOTE — Progress Notes (Signed)
I have seen and examined the patient and agree with the assessment and plans. C.diff is neg Doubt appendicitis based on exam.    Charda Janis A. Magnus Ivan  MD, FACS

## 2013-03-24 NOTE — Progress Notes (Signed)
Pt has a home CPAP machine that he has been using and can place himself on and off as needed. Rt will assist as needed.

## 2013-03-24 NOTE — Progress Notes (Signed)
Patient ID: Johnny Nichols, male   DOB: 07-Apr-1966, 47 y.o.   MRN: [id number]    Subjective: Pt just woke up.  Having some pain today, but minimal  Objective: Vital signs in last 24 hours: Temp:  [99.1 F (37.3 C)-99.6 F (37.6 C)] 99.4 F (37.4 C) (07/24 0432) Pulse Rate:  [78-97] 78 (07/24 0432) Resp:  [18-20] 18 (07/24 0432) BP: (128-149)/(71-87) 128/71 mmHg (07/24 0432) SpO2:  [97 %-98 %] 97 % (07/24 0432) Last BM Date: 03/22/13  Intake/Output from previous day: 07/23 0701 - 07/24 0700 In: 1650 [P.O.:1200; IV Piggyback:450] Out: 401 [Urine:400; Stool:1] Intake/Output this shift:    PE: Abd: soft, tender in upper abdomen with some voluntary guarding with palpation.  No lower abdominal pain.  No tenderness at McBurney's point. +BS, ND  Lab Results:   Recent Labs  03/23/13 0135 03/24/13 0442  WBC 13.7* 11.8*  HGB 10.9* 11.0*  HCT 31.9* 32.3*  PLT 294 355   BMET  Recent Labs  03/22/13 0540 03/24/13 0442  NA 136 138  K 3.9 3.6  CL 101 100  CO2 27 27  GLUCOSE 284* 196*  BUN 5* 6  CREATININE 0.60 0.67  CALCIUM 8.9 9.3   PT/INR No results found for this basename: LABPROT, INR,  in the last 72 hours CMP     Component Value Date/Time   NA 138 03/24/2013 0442   K 3.6 03/24/2013 0442   CL 100 03/24/2013 0442   CO2 27 03/24/2013 0442   GLUCOSE 196* 03/24/2013 0442   BUN 6 03/24/2013 0442   CREATININE 0.67 03/24/2013 0442   CALCIUM 9.3 03/24/2013 0442   PROT 6.5 03/24/2013 0442   ALBUMIN 2.2* 03/24/2013 0442   AST 15 03/24/2013 0442   ALT 20 03/24/2013 0442   ALKPHOS 73 03/24/2013 0442   BILITOT 0.2* 03/24/2013 0442   GFRNONAA >90 03/24/2013 0442   GFRAA >90 03/24/2013 0442   Lipase     Component Value Date/Time   LIPASE 26 03/23/2013 1815       Studies/Results: Ct Abdomen Pelvis Wo Contrast  03/23/2013   *RADIOLOGY REPORT*  Clinical Data: Hematuria  CT ABDOMEN AND PELVIS WITHOUT CONTRAST  Technique:  Multidetector CT imaging of the abdomen and pelvis was  performed following the standard protocol without intravenous contrast.  Comparison: None.  Findings: There may be a punctate calculus in the upper pole collecting system of the left kidney measuring only 1-2 mm.  There is otherwise no evidence of renal, ureteral or bladder calculi.  No hydronephrosis is identified.  There is stranding and edema in the right retroperitoneum beginning in the lower abdomen just above the iliac crests and extending inferiorly into the pelvis predominately along the anterior aspect of the iliopsoas muscle.  This is in the vicinity of the right ureter.  Differential considerations include urinary extravasation, hemorrhage, inflammation from infection or fibrosis.  There is suggestion of mild stranding in the peripancreatic fat in the region of the body and tail of the pancreas.  This could represent change related to pancreatitis.  The additional stranding in the lower right retroperitoneum could be some extension of inflammatory change from pancreatitis into the pelvis.  However, no similar changes are noted on the left.  No focal pseudocyst or abscess is identified.  The appendix is mildly dilated in caliber, measuring up to 10 mm in diameter.  Some of the right sided retroperitoneal inflammatory change does nearly abut the appendix, but is not centered at the level of the  appendix.  A low grade appendicitis cannot be excluded by unenhanced CT.  No definite enlarged lymph nodes are seen.  The visualized lung bases show a patchy airspace infiltrate of the posterior left lower lobe suspicious for pneumonia. The liver, spleen and adrenal glands are unremarkable.  The gallbladder is contracted.  Bowel shows no evidence of obstruction or ileus.  The bladder is decompressed and shows some suggestion of potential circumferential wall thickening and.  The prostate gland is not overtly enlarged.  There is a small right inguinal hernia containing fat.  No bony abnormalities are seen.   IMPRESSION:  1.  Subtle inflammation and stranding of the peripancreatic fat at the level of the body and tail of the pancreas.  This could be related to pancreatitis.  There is no evidence to suggest pseudocyst formation or focal abscess. 2. Inflammatory stranding in the right retroperitoneum. Differential considerations as above include inflammatory process, urinary extravasation or hemorrhage.  This could potentially be some extension of pancreatitis into the right retroperitoneum. 3.  Thickening of the appendix.  Low grade appendicitis is not excluded. 4.  Circumferential wall thickening of the bladder is suggested. However, the bladder is underdistended at the time of imaging. 5.  Left lower lobe pneumonia.l   Original Report Authenticated By: Irish Lack, M.D.    Anti-infectives: Anti-infectives   Start     Dose/Rate Route Frequency Ordered Stop   03/23/13 1800  piperacillin-tazobactam (ZOSYN) IVPB 3.375 g     3.375 g 12.5 mL/hr over 240 Minutes Intravenous Every 8 hours 03/23/13 1707     03/23/13 1800  vancomycin (VANCOCIN) IVPB 1000 mg/200 mL premix     1,000 mg 200 mL/hr over 60 Minutes Intravenous Every 8 hours 03/23/13 1716     03/19/13 2200  amoxicillin-clavulanate (AUGMENTIN) 875-125 MG per tablet 1 tablet  Status:  Discontinued     1 tablet Oral Every 12 hours 03/19/13 1752 03/23/13 1707   03/16/13 0030  piperacillin-tazobactam (ZOSYN) IVPB 3.375 g  Status:  Discontinued     3.375 g 12.5 mL/hr over 240 Minutes Intravenous 3 times per day 03/16/13 0010 03/19/13 1752       Assessment/Plan  1. Diarrhea 2. Abdominal pain 3. Hematuria 4. DM  Plan: 1. The patient does not have sx c/w appendicitis. 2. He continues to have diarrhea.  Await c diff results. 3. Pain is upper abdomen does not have a clear etiology at this time.  If C. Diff is positive, that would explain some of his tenderness. 4. No surgical indications.  Will follow.   LOS: 9 days    Floreine Kingdon  E 03/24/2013, 7:40 AM Pager: [phone]

## 2013-03-25 LAB — VANCOMYCIN, TROUGH: Vancomycin Tr: 15.9 ug/mL (ref 10.0–20.0)

## 2013-03-25 LAB — GLUCOSE, CAPILLARY

## 2013-03-25 LAB — CBC
Hemoglobin: 11.4 g/dL — ABNORMAL LOW (ref 13.0–17.0)
MCH: 29.4 pg (ref 26.0–34.0)
MCHC: 33.5 g/dL (ref 30.0–36.0)
MCV: 87.6 fL (ref 78.0–100.0)

## 2013-03-25 MED ORDER — UNABLE TO FIND: Status: DC

## 2013-03-25 MED ORDER — INSULIN ASPART PROT & ASPART (70-30 MIX) 100 UNIT/ML ~~LOC~~ SUSP: 30.0000 [IU] | Freq: Two times a day (BID) | SUBCUTANEOUS | Status: DC

## 2013-03-25 MED ORDER — METFORMIN HCL 500 MG PO TABS: 500.0000 mg | ORAL_TABLET | Freq: Two times a day (BID) | ORAL | Status: DC

## 2013-03-25 MED ORDER — INSULIN ASPART PROT & ASPART (70-30 MIX) 100 UNIT/ML ~~LOC~~ SUSP
30.0000 [IU] | Freq: Two times a day (BID) | SUBCUTANEOUS | Status: DC
  Filled 2013-03-25: qty 10

## 2013-03-25 MED ORDER — LEVOFLOXACIN 750 MG PO TABS: 750.0000 mg | ORAL_TABLET | Freq: Every day | ORAL | Status: DC

## 2013-03-25 MED ORDER — "INSULIN SYRINGE-NEEDLE U-100 28G X 1/2"" 0.5 ML MISC": Status: DC

## 2013-03-25 NOTE — Progress Notes (Signed)
Patient ID: Johnny Nichols, male   DOB: 11-30-1965, 47 y.o.   MRN: [id number]    Subjective: Pt feels ok this morning.  Still having some pain, but hasn't had a BM this morning so unsure if this is gas.  Tolerating a diet.  Objective: Vital signs in last 24 hours: Temp:  [97.4 F (36.3 C)-99.1 F (37.3 C)] 97.4 F (36.3 C) (07/25 0500) Pulse Rate:  [84-94] 84 (07/25 0500) Resp:  [18-19] 19 (07/25 0500) BP: (114-130)/(68-83) 127/77 mmHg (07/25 0500) SpO2:  [96 %-99 %] 96 % (07/25 0500) Weight:  [199 lb 8.3 oz (90.501 kg)] 199 lb 8.3 oz (90.501 kg) (07/24 2039) Last BM Date: 03/24/13  Intake/Output from previous day: 07/24 0701 - 07/25 0700 In: 1710 [P.O.:960; IV Piggyback:750] Out: -  Intake/Output this shift:    PE: Abd: soft, mild tenderness mostly on the left side of his abdomen, no RLQ pain. +BS, ND  Lab Results:   Recent Labs  03/24/13 0442 03/25/13 0520  WBC 11.8* 10.9*  HGB 11.0* 11.4*  HCT 32.3* 34.0*  PLT 355 386   BMET  Recent Labs  03/24/13 0442  NA 138  K 3.6  CL 100  CO2 27  GLUCOSE 196*  BUN 6  CREATININE 0.67  CALCIUM 9.3   PT/INR No results found for this basename: LABPROT, INR,  in the last 72 hours CMP     Component Value Date/Time   NA 138 03/24/2013 0442   K 3.6 03/24/2013 0442   CL 100 03/24/2013 0442   CO2 27 03/24/2013 0442   GLUCOSE 196* 03/24/2013 0442   BUN 6 03/24/2013 0442   CREATININE 0.67 03/24/2013 0442   CALCIUM 9.3 03/24/2013 0442   PROT 6.5 03/24/2013 0442   ALBUMIN 2.2* 03/24/2013 0442   AST 15 03/24/2013 0442   ALT 20 03/24/2013 0442   ALKPHOS 73 03/24/2013 0442   BILITOT 0.2* 03/24/2013 0442   GFRNONAA >90 03/24/2013 0442   GFRAA >90 03/24/2013 0442   Lipase     Component Value Date/Time   LIPASE 26 03/23/2013 1815       Studies/Results: Ct Abdomen Pelvis Wo Contrast  03/23/2013   *RADIOLOGY REPORT*  Clinical Data: Hematuria  CT ABDOMEN AND PELVIS WITHOUT CONTRAST  Technique:  Multidetector CT imaging of the  abdomen and pelvis was performed following the standard protocol without intravenous contrast.  Comparison: None.  Findings: There may be a punctate calculus in the upper pole collecting system of the left kidney measuring only 1-2 mm.  There is otherwise no evidence of renal, ureteral or bladder calculi.  No hydronephrosis is identified.  There is stranding and edema in the right retroperitoneum beginning in the lower abdomen just above the iliac crests and extending inferiorly into the pelvis predominately along the anterior aspect of the iliopsoas muscle.  This is in the vicinity of the right ureter.  Differential considerations include urinary extravasation, hemorrhage, inflammation from infection or fibrosis.  There is suggestion of mild stranding in the peripancreatic fat in the region of the body and tail of the pancreas.  This could represent change related to pancreatitis.  The additional stranding in the lower right retroperitoneum could be some extension of inflammatory change from pancreatitis into the pelvis.  However, no similar changes are noted on the left.  No focal pseudocyst or abscess is identified.  The appendix is mildly dilated in caliber, measuring up to 10 mm in diameter.  Some of the right sided retroperitoneal inflammatory change does  nearly abut the appendix, but is not centered at the level of the appendix.  A low grade appendicitis cannot be excluded by unenhanced CT.  No definite enlarged lymph nodes are seen.  The visualized lung bases show a patchy airspace infiltrate of the posterior left lower lobe suspicious for pneumonia. The liver, spleen and adrenal glands are unremarkable.  The gallbladder is contracted.  Bowel shows no evidence of obstruction or ileus.  The bladder is decompressed and shows some suggestion of potential circumferential wall thickening and.  The prostate gland is not overtly enlarged.  There is a small right inguinal hernia containing fat.  No bony  abnormalities are seen.  IMPRESSION:  1.  Subtle inflammation and stranding of the peripancreatic fat at the level of the body and tail of the pancreas.  This could be related to pancreatitis.  There is no evidence to suggest pseudocyst formation or focal abscess. 2. Inflammatory stranding in the right retroperitoneum. Differential considerations as above include inflammatory process, urinary extravasation or hemorrhage.  This could potentially be some extension of pancreatitis into the right retroperitoneum. 3.  Thickening of the appendix.  Low grade appendicitis is not excluded. 4.  Circumferential wall thickening of the bladder is suggested. However, the bladder is underdistended at the time of imaging. 5.  Left lower lobe pneumonia.l   Original Report Authenticated By: Irish Lack, M.D.    Anti-infectives: Anti-infectives   Start     Dose/Rate Route Frequency Ordered Stop   03/23/13 1800  piperacillin-tazobactam (ZOSYN) IVPB 3.375 g     3.375 g 12.5 mL/hr over 240 Minutes Intravenous Every 8 hours 03/23/13 1707     03/23/13 1800  vancomycin (VANCOCIN) IVPB 1000 mg/200 mL premix     1,000 mg 200 mL/hr over 60 Minutes Intravenous Every 8 hours 03/23/13 1716     03/19/13 2200  amoxicillin-clavulanate (AUGMENTIN) 875-125 MG per tablet 1 tablet  Status:  Discontinued     1 tablet Oral Every 12 hours 03/19/13 1752 03/23/13 1707   03/16/13 0030  piperacillin-tazobactam (ZOSYN) IVPB 3.375 g  Status:  Discontinued     3.375 g 12.5 mL/hr over 240 Minutes Intravenous 3 times per day 03/16/13 0010 03/19/13 1752       Assessment/Plan  1. Abdominal pain, unknown etiology  Plan: 1. The patient likely does not have appendicitis given his current state as his WBC is normalizing, no fevers, and no RLQ abdominal pain.  There are no surgical indications.  We will sign off.   LOS: 10 days    Ozelle Brubacher E 03/25/2013, 7:58 AM Pager: [phone]

## 2013-03-25 NOTE — Progress Notes (Signed)
I have seen and examined the patient and agree with the assessment and plans. Please call us if needed.  Polk Minor A. Magnus Ivan  MD, FACS

## 2013-03-25 NOTE — Progress Notes (Signed)
Patient discharged home with girlfriend. Patient was taught how to self administer insulin injections and check CBGs. Patient preformed both skills with home machines. Patient was taught about signs and symptoms of hypo and hyperglycemia and how to treat each. Patient was given discharge paperwork and prescriptions. Patient was given information on match program and how and where to get prescriptions. Patient was told to contact doctor with questions and concerns. Patient was stable upon discharge.

## 2013-03-25 NOTE — Progress Notes (Signed)
ANTIBIOTIC CONSULT NOTE - Follow Up  Pharmacy Consult for vancomycin Indication: rule out pneumonia  Allergies  Allergen Reactions  . Clindamycin/Lincomycin    Patient Measurements: Height: 5\' 9"  (175.3 cm) Weight: 199 lb 8.3 oz (90.501 kg) IBW/kg (Calculated) : 70.7  Vital Signs: Temp: 97.4 F (36.3 C) (07/25 0500) Temp src: Oral (07/25 0500) BP: 127/77 mmHg (07/25 0500) Pulse Rate: 84 (07/25 0500) Intake/Output from previous day: 07/24 0701 - 07/25 0700 In: 960 [P.O.:960] Out: -  Intake/Output from this shift:  Labs:  Recent Labs  03/23/13 0135 03/24/13 0442 03/25/13 0520  WBC 13.7* 11.8* 10.9*  HGB 10.9* 11.0* 11.4*  PLT 294 355 386  CREATININE  --  0.67  --    Estimated Creatinine Clearance: 128.3 ml/min (by C-G formula based on Cr of 0.67).  Recent Labs  03/25/13 0520  VANCOTROUGH 15.9    Microbiology: Recent Results (from the past 720 hour(s))  MRSA PCR SCREENING     Status: None   Collection Time    03/15/13 11:47 PM      Result Value Range Status   MRSA by PCR NEGATIVE  NEGATIVE Final   Comment:            The GeneXpert MRSA Assay (FDA     approved for NASAL specimens     only), is one component of a     comprehensive MRSA colonization     surveillance program. It is not     intended to diagnose MRSA     infection nor to guide or     monitor treatment for     MRSA infections.  CULTURE, BLOOD (ROUTINE X 2)     Status: None   Collection Time    03/16/13 12:11 AM      Result Value Range Status   Specimen Description BLOOD RIGHT ARM   Final   Special Requests BOTTLES DRAWN AEROBIC AND ANAEROBIC 10CC EACH   Final   Culture  Setup Time 03/16/2013 09:26   Final   Culture NO GROWTH 5 DAYS   Final   Report Status 03/22/2013 FINAL   Final  CULTURE, BLOOD (ROUTINE X 2)     Status: None   Collection Time    03/16/13 12:30 AM      Result Value Range Status   Specimen Description BLOOD RIGHT HAND   Final   Special Requests BOTTLES DRAWN AEROBIC  ONLY 10CC   Final   Culture  Setup Time 03/16/2013 09:26   Final   Culture NO GROWTH 5 DAYS   Final   Report Status 03/22/2013 FINAL   Final  URINE CULTURE     Status: None   Collection Time    03/16/13  2:19 AM      Result Value Range Status   Specimen Description URINE, CLEAN CATCH   Final   Special Requests NONE   Final   Culture  Setup Time 03/16/2013 03:34   Final   Colony Count NO GROWTH   Final   Culture NO GROWTH   Final   Report Status 03/17/2013 FINAL   Final  CLOSTRIDIUM DIFFICILE BY PCR     Status: None   Collection Time    03/23/13  9:41 PM      Result Value Range Status   C difficile by pcr NEGATIVE  NEGATIVE Final   Medical History: Past Medical History  Diagnosis Date  . Sleep apnea    Medications:  Prescriptions prior to admission  Medication Sig  Dispense Refill  . POTASSIUM CHLORIDE PO Take 1 tablet by mouth daily.       Assessment: 46 yom initially presented to the hospital with DKA. Had received antibiotic treatment for a dental abscess but now broadening coverage to vancomycin + zosyn for pneumonia. Pt is currently afebrile and WBC is mildly elevated at 13.7. Pt has a normal creatinine and good UOP.  His Vancomycin trough level is 15.9 mcg/ml which indicates appropriate clearance.  Vanc 7/23>> Zosyn 7/16>>7/19; 7/23>> Augmentin 7/19>>7/23  7/16 Blood - NEG 7/16 Urine - NEG 7/15 MRSA - NEG  Goal of Therapy:  Vancomycin trough level 15-20 mcg/ml  Plan:  1.  Continue Vancomycin 1gm IV Q8H  2.  No dosing changes to Zosyn 3.375gm IV Q8H (4 hr inf) per MD 3.  F/u renal fxn, C&S, clinical status and trough at [facility]  Nadara Mustard, PharmD., MS Clinical Pharmacist Pager:  [phone] Thank you for allowing pharmacy to be part of this patients care team. 03/25/2013,7:05 AM

## 2013-03-30 LAB — CULTURE, BLOOD (ROUTINE X 2)
Culture: NO GROWTH
Culture: NO GROWTH

## 2013-04-04 ENCOUNTER — Encounter: Payer: Self-pay | Admitting: Family Medicine

## 2013-04-04 ENCOUNTER — Ambulatory Visit: Payer: Self-pay | Attending: Family Medicine | Admitting: Family Medicine

## 2013-04-04 VITALS — BP 123/84 | HR 95 | Temp 99.3°F | Resp 16 | Ht 69.0 in | Wt 191.4 lb

## 2013-04-04 DIAGNOSIS — K047 Periapical abscess without sinus: Secondary | ICD-10-CM

## 2013-04-04 DIAGNOSIS — R739 Hyperglycemia, unspecified: Secondary | ICD-10-CM

## 2013-04-04 DIAGNOSIS — R319 Hematuria, unspecified: Secondary | ICD-10-CM

## 2013-04-04 DIAGNOSIS — E1110 Type 2 diabetes mellitus with ketoacidosis without coma: Secondary | ICD-10-CM

## 2013-04-04 DIAGNOSIS — K029 Dental caries, unspecified: Secondary | ICD-10-CM

## 2013-04-04 DIAGNOSIS — E119 Type 2 diabetes mellitus without complications: Secondary | ICD-10-CM

## 2013-04-04 DIAGNOSIS — G4733 Obstructive sleep apnea (adult) (pediatric): Secondary | ICD-10-CM

## 2013-04-04 DIAGNOSIS — H538 Other visual disturbances: Secondary | ICD-10-CM

## 2013-04-04 DIAGNOSIS — R7309 Other abnormal glucose: Secondary | ICD-10-CM

## 2013-04-04 DIAGNOSIS — E876 Hypokalemia: Secondary | ICD-10-CM

## 2013-04-04 DIAGNOSIS — E1310 Other specified diabetes mellitus with ketoacidosis without coma: Secondary | ICD-10-CM

## 2013-04-04 LAB — CBC WITH DIFFERENTIAL/PLATELET
Basophils Absolute: 0.1 10*3/uL (ref 0.0–0.1)
Lymphocytes Relative: 31 % (ref 12–46)
Lymphs Abs: 1.9 10*3/uL (ref 0.7–4.0)
MCV: 86.3 fL (ref 78.0–100.0)
Neutro Abs: 3.3 10*3/uL (ref 1.7–7.7)
Neutrophils Relative %: 53 % (ref 43–77)
Platelets: 499 10*3/uL — ABNORMAL HIGH (ref 150–400)
RBC: 4.09 MIL/uL — ABNORMAL LOW (ref 4.22–5.81)
RDW: 13.6 % (ref 11.5–15.5)
WBC: 6.3 10*3/uL (ref 4.0–10.5)

## 2013-04-04 LAB — GLUCOSE, POCT (MANUAL RESULT ENTRY): POC Glucose: 115 mg/dl — AB (ref 70–99)

## 2013-04-04 MED ORDER — INSULIN ASPART PROT & ASPART (70-30 MIX) 100 UNIT/ML ~~LOC~~ SUSP: 15.0000 [IU] | Freq: Two times a day (BID) | SUBCUTANEOUS | Status: DC

## 2013-04-04 MED ORDER — METFORMIN HCL ER 500 MG PO TB24: 1000.0000 mg | ORAL_TABLET | Freq: Two times a day (BID) | ORAL | Status: DC

## 2013-04-04 NOTE — Patient Instructions (Addendum)
Hypoglycemia (Low Blood Sugar) Hypoglycemia is when the glucose (sugar) in your blood is too low. Hypoglycemia can happen for many reasons. It can happen to people with or without diabetes. Hypoglycemia can develop quickly and can be a medical emergency.  CAUSES  Having hypoglycemia does not mean that you will develop diabetes. Different causes include:  Missed or delayed meals or not enough carbohydrates eaten.  Medication overdose. This could be by accident or deliberate. If by accident, your medication may need to be adjusted or changed.  Exercise or increased activity without adjustments in carbohydrates or medications.  A nerve disorder that affects body functions like your heart rate, blood pressure and digestion (autonomic neuropathy).  A condition where the stomach muscles do not function properly (gastroparesis). Therefore, medications may not absorb properly.  The inability to recognize the signs of hypoglycemia (hypoglycemic unawareness).  Absorption of insulin  may be altered.  Alcohol consumption.  Pregnancy/menstrual cycles/postpartum. This may be due to hormones.  Certain kinds of tumors. This is very rare. SYMPTOMS   Sweating.  Hunger.  Dizziness.  Blurred vision.  Drowsiness.  Weakness.  Headache.  Rapid heart beat.  Shakiness.  Nervousness. DIAGNOSIS  Diagnosis is made by monitoring blood glucose in one or all of the following ways:  Fingerstick blood glucose monitoring.  Laboratory results. TREATMENT  If you think your blood glucose is low:  Check your blood glucose, if possible. If it is less than 70 mg/dl, take one of the following:  3-4 glucose tablets.   cup juice (prefer clear like apple).   cup "regular" soda pop.  1 cup milk.  -1 tube of glucose gel.  5-6 hard candies.  Do not over treat because your blood glucose (sugar) will only go too high.  Wait 15 minutes and recheck your blood glucose. If it is still less than  70 mg/dl (or below your target range), repeat treatment.  Eat a snack if it is more than one hour until your next meal. Sometimes, your blood glucose may go so low that you are unable to treat yourself. You may need someone to help you. You may even pass out or be unable to swallow. This may require you to get an injection of glucagon, which raises the blood glucose. HOME CARE INSTRUCTIONS  Check blood glucose as recommended by your caregiver.  Take medication as prescribed by your caregiver.  Follow your meal plan. Do not skip meals. Eat on time.  If you are going to drink alcohol, drink it only with meals.  Check your blood glucose before driving.  Check your blood glucose before and after exercise. If you exercise longer or different than usual, be sure to check blood glucose more frequently.  Always carry treatment with you. Glucose tablets are the easiest to carry.  Always wear medical alert jewelry or carry some form of identification that states that you have diabetes. This will alert people that you have diabetes. If you have hypoglycemia, they will have a better idea on what to do. SEEK MEDICAL CARE IF:   You are having problems keeping your blood sugar at target range.  You are having frequent episodes of hypoglycemia.  You feel you might be having side effects from your medicines.  You have symptoms of an illness that is not improving after 3-4 days.  You notice a change in vision or a new problem with your vision. SEEK IMMEDIATE MEDICAL CARE IF:   You are a family member or friend of a   person whose blood glucose goes below 70 mg/dl and is accompanied by:  Confusion.  A change in mental status.  The inability to swallow.  Passing out. Document Released: 08/18/2005 Document Revised: 11/10/2011 Document Reviewed: 12/15/2011 [facility] Patient Information 2014 [place], [place]. Blood Sugar Monitoring, Adult GLUCOSE METERS FOR SELF-MONITORING OF BLOOD GLUCOSE  It is  important to be able to correctly measure your blood sugar (glucose). You can use a blood glucose monitor (a small battery-operated device) to check your glucose level at any time. This allows you and your caregiver to monitor your diabetes and to determine how well your treatment plan is working. The process of monitoring your blood glucose with a glucose meter is called self-monitoring of blood glucose (SMBG). When people with diabetes control their blood sugar, they have better health. To test for glucose with a typical glucose meter, place the disposable strip in the meter. Then place a small sample of blood on the "test strip." The test strip is coated with chemicals that combine with glucose in blood. The meter measures how much glucose is present. The meter displays the glucose level as a number. Several new models can record and store a number of test results. Some models can connect to personal computers to store test results or print them out.  Newer meters are often easier to use than older models. Some meters allow you to get blood from places other than your fingertip. Some new models have automatic timing, error codes, signals, or barcode readers to help with proper adjustment (calibration). Some meters have a large display screen or spoken instructions for people with visual impairments.  INSTRUCTIONS FOR USING GLUCOSE METERS  Wash your hands with soap and warm water, or clean the area with alcohol. Dry your hands completely.  Prick the side of your fingertip with a lancet (a sharp-pointed tool used by hand).  Hold the hand down and gently milk the finger until a small drop of blood appears. Catch the blood with the test strip.  Follow the instructions for inserting the test strip and using the SMBG meter. Most meters require the meter to be turned on and the test strip to be inserted before applying the blood sample.  Record the test result.  Read the instructions carefully for both  the meter and the test strips that go with it. Meter instructions are found in the user manual. Keep this manual to help you solve any problems that may arise. Many meters use "error codes" when there is a problem with the meter, the test strip, or the blood sample on the strip. You will need the manual to understand these error codes and fix the problem.  New devices are available such as laser lancets and meters that can test blood taken from "alternative sites" of the body, other than fingertips. However, you should use standard fingertip testing if your glucose changes rapidly. Also, use standard testing if:  You have eaten, exercised, or taken insulin in the past 2 hours.  You think your glucose is low.  You tend to not feel symptoms of low blood glucose (hypoglycemia).  You are ill or under stress.  Clean the meter as directed by the manufacturer.  Test the meter for accuracy as directed by the manufacturer.  Take your meter with you to your caregiver's office. This way, you can test your glucose in front of your caregiver to make sure you are using the meter correctly. Your caregiver can also take a sample of blood  to test using a routine lab method. If values on the glucose meter are close to the lab results, you and your caregiver will see that your meter is working well and you are using good technique. Your caregiver will advise you about what to do if the results do not match. FREQUENCY OF TESTING  Your caregiver will tell you how often you should check your blood glucose. This will depend on your type of diabetes, your current level of diabetes control, and your types of medicines. The following are general guidelines, but your care plan may be different. Record all your readings and the time of day you took them for review with your caregiver.   Diabetes type 1.  When you are using insulin with good diabetic control (either multiple daily injections or via a pump), you should  check your glucose 4 times a day.  If your diabetes is not well controlled, you may need to monitor more frequently, including before meals and 2 hours after meals, at bedtime, and occasionally between 2 a.m. and 3 a.m.  You should always check your glucose before a dose of insulin or before changing the rate on your insulin pump.  Diabetes type 2.  Guidelines for SMBG in diabetes type 2 are not as well defined.  If you are on insulin, follow the guidelines above.  If you are on medicines, but not insulin, and your glucose is not well controlled, you should test at least twice daily.  If you are not on insulin, and your diabetes is controlled with medicines or diet alone, you should test at least once daily, usually before breakfast.  A weekly profile will help your caregiver advise you on your care plan. The week before your visit, check your glucose before a meal and 2 hours after a meal at least daily. You may want to test before and after a different meal each day so you and your caregiver can tell how well controlled your blood sugars are throughout the course of a 24 hour period.  Gestational diabetes (diabetes during pregnancy).  Frequent testing is often necessary. Accurate timing is important.  If you are not on insulin, check your glucose 4 times a day. Check it before breakfast and 1 hour after the start of each meal.  If you are on insulin, check your glucose 6 times a day. Check it before each meal and 1 hour after the first bite of each meal.  General guidelines.  More frequent testing is required at the start of insulin treatment. Your caregiver will instruct you.  Test your glucose any time you suspect you have low blood sugar (hypoglycemia).  You should test more often when you change medicines, when you have unusual stress or illness, or in other unusual circumstances. OTHER THINGS TO KNOW ABOUT GLUCOSE METERS  Measurement Range. Most glucose meters are able to  read glucose levels over a broad range of values from as low as 0 to as high as 600 mg/dL. If you get an extremely high or low reading from your meter, you should first confirm it with another reading. Report very high or very low readings to your caregiver.  Whole Blood Glucose versus Plasma Glucose. Some older home glucose meters measure glucose in your whole blood. In a lab or when using some newer home glucose meters, the glucose is measured in your plasma (one component of blood). The difference can be important. It is important for you and your caregiver to know whether  your meter gives its results as "whole blood equivalent" or "plasma equivalent."  Display of High and Low Glucose Values. Part of learning how to operate a meter is understanding what the meter results mean. Know how high and low glucose concentrations are displayed on your meter.  Factors that Affect Glucose Meter Performance. The accuracy of your test results depends on many factors and varies depending on the brand and type of meter. These factors include:  Low red blood cell count (anemia).  Substances in your blood (such as uric acid, vitamin C, and others).  Environmental factors (temperature, humidity, altitude).  Name-brand versus generic test strips.  Calibration. Make sure your meter is set up properly. It is a good idea to do a calibration test with a control solution recommended by the manufacturer of your meter whenever you begin using a fresh bottle of test strips. This will help verify the accuracy of your meter.  Improperly stored, expired, or defective test strips. Keep your strips in a dry place with the lid on.  Soiled meter.  Inadequate blood sample. NEW TECHNOLOGIES FOR GLUCOSE TESTING Alternative site testing Some glucose meters allow testing blood from alternative sites. These include the:  Upper arm.  Forearm.  Base of the thumb.  Thigh. Sampling blood from alternative sites may be  desirable. However, it may have some limitations. Blood in the fingertips show changes in glucose levels more quickly than blood in other parts of the body. This means that alternative site test results may be different from fingertip test results, not because of the meter's ability to test accurately, but because the actual glucose concentration can be different.  Continuous Glucose Monitoring Devices to measure your blood glucose continuously are available, and others are in development. These methods can be more expensive than self-monitoring with a glucose meter. However, it is uncertain how effective and reliable these devices are. Your caregiver will advise you if this approach makes sense for you. IF BLOOD SUGARS ARE CONTROLLED, PEOPLE WITH DIABETES REMAIN HEALTHIER.  SMBG is an important part of the treatment plan of patients with diabetes mellitus. Below are reasons for using SMBG:   It confirms that your glucose is at a specific, healthy level.  It detects hypoglycemia and severe hyperglycemia.  It allows you and your caregiver to make adjustments in response to changes in lifestyle for individuals requiring medicine.  It determines the need for starting insulin therapy in temporary diabetes that happens during pregnancy (gestational diabetes). Document Released: 08/21/2003 Document Revised: 11/10/2011 Document Reviewed: 12/12/2010 [facility] Patient Information 2014 [place], [place].  Diabetes and Foot Care Diabetes may cause you to have a poor blood supply (circulation) to your legs and feet. Because of this, the skin may be thinner, break easier, and heal more slowly. You also may have nerve damage in your legs and feet causing decreased feeling. You may not notice minor injuries to your feet that could lead to serious problems or infections. Taking care of your feet is one of the most important things you can do for yourself.  HOME CARE INSTRUCTIONS  Do not go barefoot. Bare feet are  easily injured.  Check your feet daily for blisters, cuts, and redness.  Wash your feet with warm water (not hot) and mild soap. Pat your feet and between your toes until completely dry.  Apply a moisturizing lotion that does not contain alcohol or petroleum jelly to the dry skin on your feet and to dry brittle toenails. Do not put it between  your toes.  Trim your toenails straight across. Do not dig under them or around the cuticle.  Do not cut corns or calluses, or try to remove them with medicine.  Wear clean cotton socks or stockings every day. Make sure they are not too tight. Do not wear knee high stockings since they may decrease blood flow to your legs.  Wear leather shoes that fit properly and have enough cushioning. To break in new shoes, wear them just a few hours a day to avoid injuring your feet.  Wear shoes at all times, even in the house.  Do not cross your legs. This may decrease the blood flow to your feet.  If you find a minor scrape, cut, or break in the skin on your feet, keep it and the skin around it clean and dry. These areas may be cleansed with mild soap and water. Do not use peroxide, alcohol, iodine or Merthiolate.  When you remove an adhesive bandage, be sure not to harm the skin around it.  If you have a wound, look at it several times a day to make sure it is healing.  Do not use heating pads or hot water bottles. Burns can occur. If you have lost feeling in your feet or legs, you may not know it is happening until it is too late.  Report any cuts, sores or bruises to your caregiver. Do not wait! SEEK MEDICAL CARE IF:   You have an injury that is not healing or you notice redness, numbness, burning, or tingling.  Your feet always feel cold.  You have pain or cramps in your legs and feet. SEEK IMMEDIATE MEDICAL CARE IF:   There is increasing redness, swelling, or increasing pain in the wound.  There is a red line that goes up your leg.  Pus is  coming from a wound.  You develop an unexplained oral temperature above 102 F (38.9 C), or as your caregiver suggests.  You notice a bad smell coming from an ulcer or wound. MAKE SURE YOU:   Understand these instructions.  Will watch your condition.  Will get help right away if you are not doing well or get worse. Document Released: 08/15/2000 Document Revised: 11/10/2011 Document Reviewed: 02/21/2009 [facility] Patient Information 2014 [place], [place]. Diabetes, Eating Away From Home Sometimes, you might eat in a restaurant or have meals that are prepared by someone else. You can enjoy eating out. However, the portions in restaurants may be much larger than needed. Listed below are some ideas to help you choose foods that will keep your blood glucose (sugar) in better control.  TIPS FOR EATING OUT  Know your meal plan and how many carbohydrate servings you should have at each meal. You may wish to carry a copy of your meal plan in your purse or wallet. Learn the foods included in each food group.  Make a list of restaurants near you that offer healthy choices. Take a copy of the carry-out menus to see what they offer. Then, you can plan what you will order ahead of time.  Become familiar with serving sizes by practicing them at home using measuring cups and spoons. Once you learn to recognize portion sizes, you will be able to correctly estimate the amount of total carbohydrate you are allowed to eat at the restaurant. Ask for a takeout box if the portion is more than you should have. When your food comes, leave the amount you should have on the plate, and put  the rest in the takeout box before you start eating.  Plan ahead if your mealtime will be different from usual. Check with your caregiver to find out how to time meals and medicine if you are taking insulin.  Avoid high-fat foods, such as fried foods, cream sauces, high-fat salad dressings, or any added butter or margarine.  Do not  be afraid to ask questions. Ask your server about the portion size, cooking methods, ingredients and if items can be substituted. Restaurants do not list all available items on the menu. You can ask for your main entree to be prepared using skim milk, oil instead of butter or margarine, and without gravy or sauces. Ask your waiter or waitress to serve salad dressings, gravy, sauces, margarine, and sour cream on the side. You can then add the amount your meal plan suggests.  Add more vegetables whenever possible.  Avoid items that are labeled "jumbo," "giant," "deluxe," or "supersized."  You may want to split an entre with someone and order an extra side salad.  Watch for hidden calories in foods like croutons, bacon, or cheese.  Ask your server to take away the bread basket or chips from your table.  Order a dinner salad as an appetizer. You can eat most foods served in a restaurant. Some foods are better choices than others. Breads and Starches  Recommended: All kinds of bread (wheat, rye, white, oatmeal, [place], [place], raisin), hard or soft dinner rolls, frankfurter or hamburger buns, small bagels, small corn or whole-wheat flour tortillas.  Avoid: Frosted or glazed breads, butter rolls, egg or cheese breads, croissants, sweet rolls, pastries, coffee cake, glazed or frosted doughnuts, muffins. Crackers  Recommended: Animal crackers, graham, rye, saltine, oyster, and matzoth crackers. Bread sticks, melba toast, rusks, pretzels, popcorn (without fat), zwieback toast.  Avoid: High-fat snack crackers or chips. Buttered popcorn. Cereals  Recommended: Hot and cold cereals. Whole grains such as oatmeal or shredded wheat are good choices.  Avoid: Sugar-coated or granola type cereals. Potatoes/Pasta/Rice/Beans  Recommended: Order baked, boiled, or mashed potatoes, rice or noodles without added fat, whole beans. Order gravies, butter, margarine, or sauces on the side so you can control the  amount you add.  Avoid: Hash browns or fried potatoes. Potatoes, pasta, or rice prepared with cream or cheese sauce. Potato or pasta salads prepared with large amounts of dressing. Fried beans or fried rice. Vegetables  Recommended: Order steamed, baked, boiled, or stewed vegetables without sauces or extra fat. Ask that sauce be served on the side. If vegetables are not listed on the menu, ask what is available.  Avoid: Vegetables prepared with cream, butter, or cheese sauce. Fried vegetables. Salad Bars  Recommended: Many of the vegetables at a salad bar are considered "free." Use lemon juice, vinegar, or low-calorie salad dressing (fewer than 20 calories per serving) as "free" dressings for your salad. Look for salad bar ingredients that have no added fat or sugar such as tomatoes, lettuce, cucumbers, broccoli, carrots, onions, and mushrooms.  Avoid: Prepared salads with large amounts of dressing, such as coleslaw, caesar salad, macaroni salad, bean salad, or carrot salad. Fruit  Recommended: Eat fresh fruit or fresh fruit salad without added dressing. A salad bar often offers fresh fruit choices, but canned fruit at a restaurant is usually packed in sugar or syrup.  Avoid: Sweetened canned or frozen fruits, plain or sweetened fruit juice. Fruit salads with dressing, sour cream, or sugar added to them. Meat and Meat Substitutes  Recommended: Order broiled, baked,  roasted, or grilled meat, poultry, or fish. Trim off all visible fat. Do not eat the skin of poultry. The size stated on the menu is the raw weight. Meat shrinks by  in cooking (for example, 4 oz raw equals 3 oz cooked meat).  Avoid: Deep-fat fried meat, poultry, or fish. Breaded meats. Eggs  Recommended: Order soft, hard-cooked, poached, or scrambled eggs. Omelets may be okay, depending on what ingredients are added. Egg substitutes are also a good choice.  Avoid: Fried eggs, eggs prepared with cream or cheese  sauce. Milk  Recommended: Order low-fat or fat-free milk according to your meal plan. Plain, nonfat yogurt or flavored yogurt with no sugar added may be used as a substitute for milk. Soy milk may also be used.  Avoid: Milk shakes or sweetened milk beverages. Soups and Combination Foods  Recommended: Clear broth or consomm are "free" foods and may be used as an appetizer. Broth-based soups with fat removed count as a starch serving and are preferred over cream soups. Soups made with beans or split peas may be eaten but count as a starch.  Avoid: Fatty soups, soup made with cream, cheese soup. Combination foods prepared with excessive amounts of fat or with cream or cheese sauces. Desserts and Sweets  Recommended: Ask for fresh fruit. Sponge or angel food cake without icing, ice milk, no sugar added ice cream, sherbet, or frozen yogurt may fit into your meal plan occasionally.  Avoid: Pastries, puddings, pies, cakes with icing, custard, gelatin desserts. Fats and Oils  Recommended: Choose healthy fats such as olive oil, canola oil, or tub margarine, reduced fat or fat-free sour cream, cream cheese, avocado, or nuts.  Avoid: Any fats in excess of your allowed portion. Deep-fried foods or any food with a large amount of fat. Note: Ask for all fats to be served on the side, and limit your portion sizes according to your meal plan. Document Released: 08/18/2005 Document Revised: 11/10/2011 Document Reviewed: 03/08/2009 [facility] Patient Information 2014 [place], [place].

## 2013-04-04 NOTE — Progress Notes (Signed)
Pt here new onset Diabetes  Taking 70/30 insulin 30 units bid C/o abdominal tightness at times Need teaching Last A1C 7/17 14.4

## 2013-04-04 NOTE — Progress Notes (Signed)
Patient ID: Johnny Nichols, male   DOB: April 02, 1966, 47 y.o.   MRN: [id number]  CC:  Hospital Follow Up   HPI: Pt is presenting to follow up from recent hospitalization from hyperglycemia and DKA.  Pt says that  he is taking insulin with breakfast and supper.  Patient reports that he has had blood glucose readings 89-150.  The patient reports no blood glucose readings less than 70.  The patient reports that he is taking the metformin but having some diarrhea.  The patient reports that he is scheduled to followup with the urologist as arranged at discharge from the hospital.  The patient reports that he is eating approximately 70 carbs per meal.  The patient reports that he is testing his blood glucose at least 2 times per day.   The patient reports that he is using his CPAP at home.  He also reports he still having some visual changes.  Mostly blurry vision.  Allergies  Allergen Reactions  . Clindamycin/Lincomycin    Past Medical History  Diagnosis Date  . Sleep apnea   . Diabetes mellitus without complication    Current Outpatient Prescriptions on File Prior to Visit  Medication Sig Dispense Refill  . Insulin Syringe-Needle U-100 28G X 1/2" 0.5 ML MISC Draw up 30 units (0.3cc) of 70/30 insulin and inject two times a day  100 each  1  . POTASSIUM CHLORIDE PO Take 1 tablet by mouth daily.       No current facility-administered medications on file prior to visit.   Family History  Problem Relation Age of Onset  . Heart disease Mother   . Cancer Father    History   Social History  . Marital Status: Divorced    Spouse Name: N/A    Number of Children: N/A  . Years of Education: N/A   Occupational History  . Not on file.   Social History Main Topics  . Smoking status: Never Smoker   . Smokeless tobacco: Not on file  . Alcohol Use: No     Comment: no drink x 3 weeks  . Drug Use: Not on file  . Sexually Active: Not on file   Other Topics Concern  . Not on file   Social  History Narrative  . No narrative on file    Review of Systems  Constitutional: Negative for fever, chills, diaphoresis, activity change, appetite change and fatigue.  HENT: Negative for ear pain, nosebleeds, congestion, facial swelling, rhinorrhea, neck pain, neck stiffness and ear discharge.   Eyes: Negative for pain, discharge, redness, itching and visual disturbance.  Respiratory: Negative for cough, choking, chest tightness, shortness of breath, wheezing and stridor.   Cardiovascular: Negative for chest pain, palpitations and leg swelling.  Gastrointestinal: Negative for abdominal distention.  diarrhea or loose stool Genitourinary: Negative for dysuria, urgency, frequency, hematuria, flank pain, decreased urine volume, difficulty urinating and dyspareunia.  Musculoskeletal: Negative for back pain, joint swelling, arthralgias and gait problem.  Neurological: Negative for dizziness, tremors, seizures, syncope, facial asymmetry, speech difficulty, weakness, light-headedness, numbness and headaches.  Hematological: Negative for adenopathy. Does not bruise/bleed easily.  Psychiatric/Behavioral: Negative for hallucinations, behavioral problems, confusion, dysphoric mood, decreased concentration and agitation.    Objective:   Filed Vitals:   04/04/13 1156  BP: 123/84  Pulse: 95  Temp: 99.3 F (37.4 C)  Resp: 16    Physical Exam  Constitutional: Appears well-developed and well-nourished. No distress.  HENT: Normocephalic. External right and left ear normal. Oropharynx is clear  and moist.  Eyes: Conjunctivae and EOM are normal. PERRLA, no scleral icterus.  Neck: Normal ROM. Neck supple. No JVD. No tracheal deviation. No thyromegaly.  CVS: RRR, S1/S2 +, no murmurs, no gallops, no carotid bruit.  Pulmonary: Effort and breath sounds normal, no stridor, rhonchi, wheezes, rales.  Abdominal: Soft. BS +,  no distension, tenderness, rebound or guarding.  Musculoskeletal: Normal range of  motion. No edema and no tenderness.  Lymphadenopathy: No lymphadenopathy noted, cervical, inguinal. Neuro: Alert. Normal reflexes, muscle tone coordination. No cranial nerve deficit. Skin: Skin is warm and dry. No rash noted. Not diaphoretic. No erythema. No pallor.  Psychiatric: Normal mood and affect. Behavior, judgment, thought content normal.   Lab Results  Component Value Date   WBC 10.9* 03/25/2013   HGB 11.4* 03/25/2013   HCT 34.0* 03/25/2013   MCV 87.6 03/25/2013   PLT 386 03/25/2013   Lab Results  Component Value Date   CREATININE 0.67 03/24/2013   BUN 6 03/24/2013   NA 138 03/24/2013   K 3.6 03/24/2013   CL 100 03/24/2013   CO2 27 03/24/2013    Lab Results  Component Value Date   HGBA1C 14.4* 03/17/2013   Lipid Panel     Component Value Date/Time   CHOL 126 03/19/2013 0430   TRIG 96 03/19/2013 0430   HDL 26* 03/19/2013 0430   CHOLHDL 4.8 03/19/2013 0430   VLDL 19 03/19/2013 0430   LDLCALC 81 03/19/2013 0430     Assessment and plan:   Patient Active Problem List   Diagnosis Date Noted  . Dental caries 04/04/2013  . Hematuria 03/23/2013  . Blurry vision, bilateral 03/22/2013  . Dehydration 03/18/2013  . Hypokalemia 03/18/2013  . Diabetes mellitus, new onset 03/16/2013  . DKA (diabetic ketoacidoses) 03/16/2013  . Hyperglycemia 03/16/2013  . Acute encephalopathy 03/16/2013  . OSA (obstructive sleep apnea) 03/16/2013  . Dental abscess/recent triple root canal 03/16/2013   DM (diabetes mellitus) type 2, uncontrolled, with ketoacidosis - Plan: Glucose (CBG), CBC with Differential, COMPLETE METABOLIC PANEL WITH GFR, Ambulatory referral to Dentistry, Ambulatory referral to diabetic education, GAD-65 Autoantibody, C-peptide, Ambulatory referral to Ophthalmology  OSA (obstructive sleep apnea) - Plan: CBC with Differential, COMPLETE METABOLIC PANEL WITH GFR, Ambulatory referral to Dentistry, Ambulatory referral to diabetic education, GAD-65 Autoantibody, C-peptide, Ambulatory  referral to Ophthalmology  Hypokalemia - Plan: CBC with Differential, COMPLETE METABOLIC PANEL WITH GFR, Ambulatory referral to Dentistry, Ambulatory referral to diabetic education, GAD-65 Autoantibody, C-peptide, Ambulatory referral to Ophthalmology  Hyperglycemia - Plan: CBC with Differential, COMPLETE METABOLIC PANEL WITH GFR, Ambulatory referral to Dentistry, Ambulatory referral to diabetic education, GAD-65 Autoantibody, C-peptide, Ambulatory referral to Ophthalmology  DKA (diabetic ketoacidoses) - Plan: CBC with Differential, COMPLETE METABOLIC PANEL WITH GFR, Ambulatory referral to Dentistry, Ambulatory referral to diabetic education, GAD-65 Autoantibody, C-peptide, Ambulatory referral to Ophthalmology  Diabetes mellitus, new onset - Plan: CBC with Differential, COMPLETE METABOLIC PANEL WITH GFR, Ambulatory referral to Dentistry, Ambulatory referral to diabetic education, GAD-65 Autoantibody, C-peptide, Ambulatory referral to Ophthalmology  Dental abscess/recent triple root canal - Plan: CBC with Differential, COMPLETE METABOLIC PANEL WITH GFR, Ambulatory referral to Dentistry, Ambulatory referral to diabetic education, GAD-65 Autoantibody, C-peptide, Ambulatory referral to Ophthalmology  Blurry vision, bilateral - Plan: CBC with Differential, COMPLETE METABOLIC PANEL WITH GFR, Ambulatory referral to Dentistry, Ambulatory referral to diabetic education, GAD-65 Autoantibody, C-peptide, Ambulatory referral to Ophthalmology  Dental caries - Plan: CBC with Differential, COMPLETE METABOLIC PANEL WITH GFR, Ambulatory referral to Dentistry, Ambulatory referral to diabetic education, GAD-65  Autoantibody, C-peptide, Ambulatory referral to Ophthalmology  Hematuria - Plan: Ambulatory referral to diabetic education, GAD-65 Autoantibody, C-peptide, Ambulatory referral to Ophthalmology  Metabolic syndrome  Followup of labs.    Patient to followup with urology for evaluation of hematuria-he was seen  initially for this and evaluated by urology in the hospital.  This is a recommended outpatient followup prescribed for cystoscopy, etc.   Change metformin to Glucophage XRT 500 mg take 2 by mouth twice a day with meals.  I asked the patient to decrease his insulin doses by 50%.  I asked the patient to monitor his blood glucose 4 times per day.  Hypoglycemia precautions discussed with the patient today.  The patient verbalized understanding.  The patient was referred for diabetes education.  The patient was referred for dental evaluation and ophthalmology evaluation.  The patient was given instructions to followup in 2 weeks and bring blood glucose readings to next office visit.  The patient was given clear instructions to go to ER or return to medical center if symptoms don't improve, worsen or new problems develop.  The patient verbalized understanding.  The patient was told to call to get any lab results if not heard anything in the next week.    Rodney Langton, MD, CDE, FAAFP [facility] [place], [place]

## 2013-04-05 ENCOUNTER — Telehealth: Payer: Self-pay | Admitting: *Deleted

## 2013-04-05 LAB — COMPLETE METABOLIC PANEL WITH GFR
ALT: 11 U/L (ref 0–53)
AST: 13 U/L (ref 0–37)
CO2: 26 mEq/L (ref 19–32)
Calcium: 9.6 mg/dL (ref 8.4–10.5)
Chloride: 105 mEq/L (ref 96–112)
Creat: 0.75 mg/dL (ref 0.50–1.35)
GFR, Est African American: >89 mL/min
Potassium: 4.6 mEq/L (ref 3.5–5.3)
Sodium: 139 mEq/L (ref 135–145)
Total Protein: 7.2 g/dL (ref 6.0–8.3)

## 2013-04-05 NOTE — Progress Notes (Signed)
Quick Note:  Please inform patient that WBC count has improved to normal. Pt is anemic with a hemoglobin of 11.5. Platelet count is elevated.   Rodney Langton, MD, CDE, FAAFP [facility] [place], [place]     ______

## 2013-04-05 NOTE — Progress Notes (Signed)
Quick Note:  Please inform patient that metabolic panel came back OK.   Rodney Langton, MD, CDE, FAAFP [facility] [place], [place]   ______

## 2013-04-05 NOTE — Telephone Encounter (Signed)
04/05/13 Patient informed theat metabolic panel came back okay. P.Ruthann Angulo,RN BSN [facility]

## 2013-04-05 NOTE — Telephone Encounter (Signed)
04/05/13 Patient made aware of lab result. Please inform patient that WBC count has improved to normal. Pt is anemic with a hemoglobin of 11.5. Platelet count is elevated.  P.Shley Dolby,RN BSN [facility]

## 2013-04-07 ENCOUNTER — Telehealth: Payer: Self-pay | Admitting: Family Medicine

## 2013-04-07 MED ORDER — METFORMIN HCL ER 500 MG PO TB24: 1000.0000 mg | ORAL_TABLET | Freq: Two times a day (BID) | ORAL | Status: DC

## 2013-04-07 NOTE — Telephone Encounter (Signed)
Pt called in to have scripts sent to [facility] on [address]; metformin extended relief (500mg )

## 2013-04-18 ENCOUNTER — Ambulatory Visit: Payer: Self-pay | Attending: Internal Medicine | Admitting: Internal Medicine

## 2013-04-18 VITALS — BP 147/91 | HR 89 | Temp 98.8°F | Ht 69.0 in | Wt 193.0 lb

## 2013-04-18 DIAGNOSIS — E1110 Type 2 diabetes mellitus with ketoacidosis without coma: Secondary | ICD-10-CM

## 2013-04-18 DIAGNOSIS — E1010 Type 1 diabetes mellitus with ketoacidosis without coma: Secondary | ICD-10-CM | POA: Insufficient documentation

## 2013-04-18 DIAGNOSIS — E1310 Other specified diabetes mellitus with ketoacidosis without coma: Secondary | ICD-10-CM

## 2013-04-18 NOTE — Progress Notes (Signed)
Patient ID: Johnny Nichols, male   DOB: 23-Apr-1966, 47 y.o.   MRN: [id number]  CC: Follow-up  HPI: Johnny Nichols is here today for followup hospital admission for hyperglycemia and DKA. He claims to be doing well. No new complaint. Needs a refill of all his medications. Denies chest pain. No shortness of breath. Has not had ophthalmology examination in years.  Allergies  Allergen Reactions  . Clindamycin/Lincomycin    Past Medical History  Diagnosis Date  . Sleep apnea   . Diabetes mellitus without complication    Current Outpatient Prescriptions on File Prior to Visit  Medication Sig Dispense Refill  . insulin aspart protamine- aspart (NOVOLOG MIX 70/30) (70-30) 100 UNIT/ML injection Inject 0.15 mLs (15 Units total) into the skin 2 (two) times daily with a meal.  10 mL  1  . Insulin Syringe-Needle U-100 28G X 1/2" 0.5 ML MISC Draw up 30 units (0.3cc) of 70/30 insulin and inject two times a day  100 each  1  . metFORMIN (GLUCOPHAGE XR) 500 MG 24 hr tablet Take 2 tablets (1,000 mg total) by mouth 2 (two) times daily with a meal.  120 tablet  3  . POTASSIUM CHLORIDE PO Take 1 tablet by mouth daily.       No current facility-administered medications on file prior to visit.   Family History  Problem Relation Age of Onset  . Heart disease Mother   . Cancer Father    History   Social History  . Marital Status: Divorced    Spouse Name: N/A    Number of Children: N/A  . Years of Education: N/A   Occupational History  . Not on file.   Social History Main Topics  . Smoking status: Never Smoker   . Smokeless tobacco: Not on file  . Alcohol Use: No     Comment: no drink x 3 weeks  . Drug Use: Not on file  . Sexual Activity: Not on file   Other Topics Concern  . Not on file   Social History Narrative  . No narrative on file    Review of Systems: Constitutional: Negative for fever, chills, diaphoresis, activity change, appetite change and fatigue. HENT: Negative for ear pain,  nosebleeds, congestion, facial swelling, rhinorrhea, neck pain, neck stiffness and ear discharge.  Eyes: Negative for pain, discharge, redness, itching and visual disturbance. Respiratory: Negative for cough, choking, chest tightness, shortness of breath, wheezing and stridor.  Cardiovascular: Negative for chest pain, palpitations and leg swelling. Gastrointestinal: Negative for abdominal distention. Genitourinary: Negative for dysuria, urgency, frequency, hematuria, flank pain, decreased urine volume, difficulty urinating and dyspareunia.  Musculoskeletal: Negative for back pain, joint swelling, arthralgias and gait problem. Neurological: Negative for dizziness, tremors, seizures, syncope, facial asymmetry, speech difficulty, weakness, light-headedness, numbness and headaches.  Hematological: Negative for adenopathy. Does not bruise/bleed easily. Psychiatric/Behavioral: Negative for hallucinations, behavioral problems, confusion, dysphoric mood, decreased concentration and agitation.    Objective:   Filed Vitals:   04/18/13 1218  BP: 147/91  Pulse: 89  Temp: 98.8 F (37.1 C)    Physical Exam: Constitutional: Patient appears well-developed and well-nourished. No distress. HENT: Normocephalic, atraumatic, External right and left ear normal. Oropharynx is clear and moist.  Eyes: Conjunctivae and EOM are normal. PERRLA, no scleral icterus. Neck: Normal ROM. Neck supple. No JVD. No tracheal deviation. No thyromegaly. CVS: RRR, S1/S2 +, no murmurs, no gallops, no carotid bruit.  Pulmonary: Effort and breath sounds normal, no stridor, rhonchi, wheezes, rales.  Abdominal: Soft. BS +,  no distension, tenderness, rebound or guarding.  Musculoskeletal: Normal range of motion. No edema and no tenderness.  Lymphadenopathy: No lymphadenopathy noted, cervical, inguinal or axillary Neuro: Alert. Normal reflexes, muscle tone coordination. No cranial nerve deficit. Skin: Skin is warm and dry. No rash  noted. Not diaphoretic. No erythema. No pallor. Psychiatric: Normal mood and affect. Behavior, judgment, thought content normal.  Lab Results  Component Value Date   WBC 6.3 04/04/2013   HGB 11.5* 04/04/2013   HCT 35.3* 04/04/2013   MCV 86.3 04/04/2013   PLT 499* 04/04/2013   Lab Results  Component Value Date   CREATININE 0.75 04/04/2013   BUN 11 04/04/2013   NA 139 04/04/2013   K 4.6 04/04/2013   CL 105 04/04/2013   CO2 26 04/04/2013    Lab Results  Component Value Date   HGBA1C 14.4* 03/17/2013   Lipid Panel     Component Value Date/Time   CHOL 126 03/19/2013 0430   TRIG 96 03/19/2013 0430   HDL 26* 03/19/2013 0430   CHOLHDL 4.8 03/19/2013 0430   VLDL 19 03/19/2013 0430   LDLCALC 81 03/19/2013 0430       Assessment and plan:   Patient Active Problem List   Diagnosis Date Noted  . Dental caries 04/04/2013  . Hematuria 03/23/2013  . Blurry vision, bilateral 03/22/2013  . Dehydration 03/18/2013  . Hypokalemia 03/18/2013  . Diabetes mellitus, new onset 03/16/2013  . DKA (diabetic ketoacidoses) 03/16/2013  . Hyperglycemia 03/16/2013  . Acute encephalopathy 03/16/2013  . OSA (obstructive sleep apnea) 03/16/2013  . Dental abscess/recent triple root canal 03/16/2013   Medication refill Hypokalemia resolved, no need for Kcl tabs Ambulatory Ophthalmology referral for check up Patient counseled about hypoglycemic symptoms and what to do Encouraged to be compliant with medications and follow-up  Johnny Nichols was given clear instructions to go to ER or return to the clinic if symptoms don't improve, worsen or new problems develop.  Johnny Nichols verbalized understanding.  Johnny Nichols was told to call to get lab results if hasn't heard anything in the next week.    Follow up in 2 months for follow up and lab draw.      Jeanann Lewandowsky, MD [facility] [place], [place] [phone]   04/18/2013, 12:35 PM

## 2013-04-25 ENCOUNTER — Ambulatory Visit: Payer: Self-pay | Attending: Internal Medicine

## 2013-06-09 ENCOUNTER — Telehealth: Payer: Self-pay | Admitting: Internal Medicine

## 2013-06-09 MED ORDER — INSULIN ASPART PROT & ASPART (70-30 MIX) 100 UNIT/ML ~~LOC~~ SUSP: 15.0000 [IU] | Freq: Two times a day (BID) | SUBCUTANEOUS | Status: DC

## 2013-06-09 NOTE — Telephone Encounter (Signed)
Pt calling about about refill for insulin. Pt is newly diagnosed and is afraid about what will happen if goes without insulin.  Says that he does not have enough for weekend and next appt is not until Nov. Please f/u with pt to ask which pharmacy he prefers, he has the orange card but still deciding whether to use [facility] pharm.

## 2013-06-10 ENCOUNTER — Other Ambulatory Visit: Payer: Self-pay | Admitting: Internal Medicine

## 2013-06-10 MED ORDER — INSULIN ASPART PROT & ASPART (70-30 MIX) 100 UNIT/ML ~~LOC~~ SUSP: 15.0000 [IU] | Freq: Two times a day (BID) | SUBCUTANEOUS | Status: DC

## 2013-06-10 MED ORDER — INSULIN NPH (HUMAN) (ISOPHANE) 100 UNIT/ML ~~LOC~~ SUSP: 15.0000 [IU] | Freq: Two times a day (BID) | SUBCUTANEOUS | Status: DC

## 2013-06-13 ENCOUNTER — Ambulatory Visit: Payer: No Typology Code available for payment source | Attending: Internal Medicine

## 2013-06-13 NOTE — Progress Notes (Unsigned)
Application for Novolog completed and sent.  Nathen May MSW, LCSW 20 min

## 2013-06-14 NOTE — Telephone Encounter (Signed)
Rx for insulin filled at visit 06/09/13

## 2013-07-19 ENCOUNTER — Ambulatory Visit: Payer: No Typology Code available for payment source | Attending: Internal Medicine | Admitting: Internal Medicine

## 2013-07-19 VITALS — BP 155/98 | HR 78 | Temp 98.5°F | Resp 16 | Ht 69.0 in | Wt 207.2 lb

## 2013-07-19 DIAGNOSIS — E1310 Other specified diabetes mellitus with ketoacidosis without coma: Secondary | ICD-10-CM

## 2013-07-19 DIAGNOSIS — E119 Type 2 diabetes mellitus without complications: Secondary | ICD-10-CM | POA: Insufficient documentation

## 2013-07-19 DIAGNOSIS — Z23 Encounter for immunization: Secondary | ICD-10-CM

## 2013-07-19 DIAGNOSIS — I10 Essential (primary) hypertension: Secondary | ICD-10-CM | POA: Insufficient documentation

## 2013-07-19 DIAGNOSIS — E1110 Type 2 diabetes mellitus with ketoacidosis without coma: Secondary | ICD-10-CM

## 2013-07-19 LAB — POCT GLYCOSYLATED HEMOGLOBIN (HGB A1C): Hemoglobin A1C: 5.6

## 2013-07-19 MED ORDER — GLIPIZIDE 5 MG PO TABS: 2.5000 mg | ORAL_TABLET | Freq: Every day | ORAL | Status: DC

## 2013-07-19 MED ORDER — METFORMIN HCL ER 500 MG PO TB24: 1000.0000 mg | ORAL_TABLET | Freq: Two times a day (BID) | ORAL | Status: DC

## 2013-07-19 MED ORDER — LISINOPRIL 5 MG PO TABS: 5.0000 mg | ORAL_TABLET | Freq: Every day | ORAL | Status: DC

## 2013-07-19 NOTE — Progress Notes (Signed)
Patient here for follow up on his DM 

## 2013-07-19 NOTE — Progress Notes (Signed)
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 Diabetes        Subjective:   Cicero Duck today is here for a follow up visit. Patient has history of diabetes and is on insulin 70/3015 units twice a day and metformin 1000 mg twice a day. He checks his sugars multiple times a day and has been running from 60-to-120. He is inquiring whether he can come off insulin today. He otherwise has no complaints.  Patient has No headache, No chest pain, No abdominal pain - No Nausea, No new weakness tingling or numbness, No Cough - SOB.  Objective:    Filed Vitals:   07/19/13 1054  BP: 155/98  Pulse: 78  Temp: 98.5 F (36.9 C)  Resp: 16  Height: 5\' 9"  (1.753 m)  Weight: 207 lb 3.2 oz (93.985 kg)  SpO2: 100%     ALLERGIES:   Allergies  Allergen Reactions  . Clindamycin/Lincomycin     PAST MEDICAL HISTORY: Past Medical History  Diagnosis Date  . Sleep apnea   . Diabetes mellitus without complication     MEDICATIONS AT HOME: Prior to Admission medications   Medication Sig Start Date End Date Taking? Authorizing Provider  glipiZIDE (GLUCOTROL) 5 MG tablet Take 0.5 tablets (2.5 mg total) by mouth daily before breakfast. 07/19/13   Maretta Bees, MD  lisinopril (PRINIVIL,ZESTRIL) 5 MG tablet Take 1 tablet (5 mg total) by mouth daily. 07/19/13   [place] Lapaglia Levora Dredge, MD  metFORMIN (GLUCOPHAGE XR) 500 MG 24 hr tablet Take 2 tablets (1,000 mg total) by mouth 2 (two) times daily with a meal. 07/19/13   Maretta Bees, MD  POTASSIUM CHLORIDE PO Take 1 tablet by mouth daily.    Historical Provider, MD     Exam  General appearance :Awake, alert, not in any distress. Speech Clear. Not toxic Looking HEENT: Atraumatic and Normocephalic, pupils equally reactive to light and accomodation Neck: supple, no JVD. No cervical lymphadenopathy.  Chest:Good air entry bilaterally,  no added sounds  CVS: S1 S2 regular, no murmurs.  Abdomen: Bowel sounds present, Non tender and not distended with no gaurding, rigidity or rebound. Extremities: B/L Lower Ext shows no edema, both legs are warm to touch Neurology: Awake alert, and oriented X 3, CN II-XII intact, Non focal Skin:No Rash Wounds:N/A    Data Review   CBC No results found for this basename: WBC, HGB, HCT, PLT, MCV, MCH, MCHC, RDW, NEUTRABS, LYMPHSABS, MONOABS, EOSABS, BASOSABS, BANDABS, BANDSABD,  in the last 168 hours  Chemistries   No results found for this basename: NA, K, CL, CO2, GLUCOSE, BUN, CREATININE, GFRCGP, CALCIUM, MG, AST, ALT, ALKPHOS, BILITOT,  in the last 168 hours ------------------------------------------------------------------------------------------------------------------  Recent Labs  07/19/13 1100  HGBA1C 5.6   ------------------------------------------------------------------------------------------------------------------ No results found for this basename: CHOL, HDL, LDLCALC, TRIG, CHOLHDL, LDLDIRECT,  in the last 72 hours ------------------------------------------------------------------------------------------------------------------ No results found for this basename: TSH, T4TOTAL, FREET3, T3FREE, THYROIDAB,  in the last 72 hours ------------------------------------------------------------------------------------------------------------------ No results found for this basename: VITAMINB12, FOLATE, FERRITIN, TIBC, IRON, RETICCTPCT,  in the last 72 hours  Coagulation profile  No results found for this basename: INR, PROTIME,  in the last 168 hours    Assessment & Plan   Diabetes - A1c significantly improved to 5.6 today- long discussion with patient-2 options given-one option was to take his insulin to half of its current dose,  continue metformin, second option was to discontinue insulin today, continue metformin and add low-dose glipizide. Patient would like to stop  insulin today, and see how he does with low-dose glipizide and metformin. He will continue checking his sugars multiple times a day and give [place] a call if his sugars are persistently high. He understands that if the sugars stayed persistently high he is at risk of DKA, and understands the need to call us for immediate visit  Hypertension - The pressure persistently high over the past few visits-will start lisinopril   Health Maintenance  -Influenza vaccination: Today  Will referred to family practice for a retinal scan for screening diabetic retinopathy  Follow up in one month  The patient was given clear instructions to go to ER or return to medical center if symptoms don't improve, worsen or new problems develop. The patient verbalized understanding. The patient was told to call to get lab results if they haven't heard anything in the next week.

## 2013-08-29 ENCOUNTER — Ambulatory Visit: Payer: No Typology Code available for payment source | Attending: Internal Medicine | Admitting: Internal Medicine

## 2013-08-29 ENCOUNTER — Encounter: Payer: Self-pay | Admitting: Internal Medicine

## 2013-08-29 VITALS — BP 150/88 | HR 86 | Temp 99.5°F | Resp 16 | Ht 69.0 in | Wt 212.0 lb

## 2013-08-29 DIAGNOSIS — E119 Type 2 diabetes mellitus without complications: Secondary | ICD-10-CM

## 2013-08-29 DIAGNOSIS — I10 Essential (primary) hypertension: Secondary | ICD-10-CM | POA: Insufficient documentation

## 2013-08-29 LAB — GLUCOSE, POCT (MANUAL RESULT ENTRY): POC Glucose: 102 mg/dl — AB (ref 70–99)

## 2013-08-29 MED ORDER — METFORMIN HCL ER 500 MG PO TB24: 1000.0000 mg | ORAL_TABLET | Freq: Two times a day (BID) | ORAL | Status: DC

## 2013-08-29 MED ORDER — LISINOPRIL 5 MG PO TABS: 5.0000 mg | ORAL_TABLET | Freq: Every day | ORAL | Status: DC

## 2013-08-29 MED ORDER — GLIPIZIDE 5 MG PO TABS: 2.5000 mg | ORAL_TABLET | Freq: Every day | ORAL | Status: DC

## 2013-08-29 NOTE — Progress Notes (Signed)
Pt here for f/u htn,diabetes with medications CBG today 129 Taking prescribed meds as ordered A1c- 5.6 last month

## 2013-08-29 NOTE — Progress Notes (Signed)
Patient ID: Johnny Nichols, male   DOB: 1966-04-09, 47 y.o.   MRN: [id number]   CC:  HPI: 47 year old male here for followup of his diabetes. The patient discontinued her insulin after his last visit. Her sugars have ranged between 97-150. He denies any hypo-or hyperglycemic symptoms. Denies any chest pain any shortness of breath     Allergies  Allergen Reactions  . Clindamycin/Lincomycin    Past Medical History  Diagnosis Date  . Sleep apnea   . Diabetes mellitus without complication   . Hypertension    No current outpatient prescriptions on file prior to visit.   No current facility-administered medications on file prior to visit.   Family History  Problem Relation Age of Onset  . Heart disease Mother   . Cancer Father    History   Social History  . Marital Status: Divorced    Spouse Name: N/A    Number of Children: N/A  . Years of Education: N/A   Occupational History  . Not on file.   Social History Main Topics  . Smoking status: Never Smoker   . Smokeless tobacco: Not on file  . Alcohol Use: No     Comment: no drink x 3 weeks  . Drug Use: Not on file  . Sexual Activity: Not on file   Other Topics Concern  . Not on file   Social History Narrative  . No narrative on file    Review of Systems  Constitutional: Negative for fever, chills, diaphoresis, activity change, appetite change and fatigue.  HENT: Negative for ear pain, nosebleeds, congestion, facial swelling, rhinorrhea, neck pain, neck stiffness and ear discharge.   Eyes: Negative for pain, discharge, redness, itching and visual disturbance.  Respiratory: Negative for cough, choking, chest tightness, shortness of breath, wheezing and stridor.   Cardiovascular: Negative for chest pain, palpitations and leg swelling.  Gastrointestinal: Negative for abdominal distention.  Genitourinary: Negative for dysuria, urgency, frequency, hematuria, flank pain, decreased urine volume, difficulty urinating  and dyspareunia.  Musculoskeletal: Negative for back pain, joint swelling, arthralgias and gait problem.  Neurological: Negative for dizziness, tremors, seizures, syncope, facial asymmetry, speech difficulty, weakness, light-headedness, numbness and headaches.  Hematological: Negative for adenopathy. Does not bruise/bleed easily.  Psychiatric/Behavioral: Negative for hallucinations, behavioral problems, confusion, dysphoric mood, decreased concentration and agitation.    Objective:   Filed Vitals:   08/29/13 1614  BP: 150/88  Pulse: 86  Temp: 99.5 F (37.5 C)  Resp: 16    Physical Exam  Constitutional: Appears well-developed and well-nourished. No distress.  HENT: Normocephalic. External right and left ear normal. Oropharynx is clear and moist.  Eyes: Conjunctivae and EOM are normal. PERRLA, no scleral icterus.  Neck: Normal ROM. Neck supple. No JVD. No tracheal deviation. No thyromegaly.  CVS: RRR, S1/S2 +, no murmurs, no gallops, no carotid bruit.  Pulmonary: Effort and breath sounds normal, no stridor, rhonchi, wheezes, rales.  Abdominal: Soft. BS +,  no distension, tenderness, rebound or guarding.  Musculoskeletal: Normal range of motion. No edema and no tenderness.  Lymphadenopathy: No lymphadenopathy noted, cervical, inguinal. Neuro: Alert. Normal reflexes, muscle tone coordination. No cranial nerve deficit. Skin: Skin is warm and dry. No rash noted. Not diaphoretic. No erythema. No pallor.  Psychiatric: Normal mood and affect. Behavior, judgment, thought content normal.   Lab Results  Component Value Date   WBC 6.3 04/04/2013   HGB 11.5* 04/04/2013   HCT 35.3* 04/04/2013   MCV 86.3 04/04/2013   PLT 499* 04/04/2013  Lab Results  Component Value Date   CREATININE 0.75 04/04/2013   BUN 11 04/04/2013   NA 139 04/04/2013   K 4.6 04/04/2013   CL 105 04/04/2013   CO2 26 04/04/2013    Lab Results  Component Value Date   HGBA1C 5.6 07/19/2013   Lipid Panel     Component Value  Date/Time   CHOL 126 03/19/2013 0430   TRIG 96 03/19/2013 0430   HDL 26* 03/19/2013 0430   CHOLHDL 4.8 03/19/2013 0430   VLDL 19 03/19/2013 0430   LDLCALC 81 03/19/2013 0430       Assessment and plan:   Patient Active Problem List   Diagnosis Date Noted  . Dental caries 04/04/2013  . Hematuria 03/23/2013  . Blurry vision, bilateral 03/22/2013  . Dehydration 03/18/2013  . Hypokalemia 03/18/2013  . Diabetes mellitus, new onset 03/16/2013  . DKA (diabetic ketoacidoses) 03/16/2013  . Hyperglycemia 03/16/2013  . Acute encephalopathy 03/16/2013  . OSA (obstructive sleep apnea) 03/16/2013  . Dental abscess/recent triple root canal 03/16/2013        Diabetes Controlled with glipizide and metformin Patient advised to decrease his CBG monitoring to twice a day  Hypertension Refill lisinopril Check creatinine, electrolytes Patient advised to monitor his blood pressure in the outpatient setting once a week and bring those numbers in for his next appointment  Follow up in 2 months   The patient was given clear instructions to go to ER or return to medical center if symptoms don't improve, worsen or new problems develop. The patient verbalized understanding. The patient was told to call to get any lab results if not heard anything in the next week.

## 2013-09-29 ENCOUNTER — Ambulatory Visit: Payer: Self-pay

## 2013-10-10 ENCOUNTER — Ambulatory Visit: Payer: No Typology Code available for payment source | Attending: Internal Medicine

## 2013-10-31 ENCOUNTER — Ambulatory Visit: Payer: Self-pay | Admitting: Internal Medicine

## 2013-11-14 ENCOUNTER — Other Ambulatory Visit: Payer: Self-pay | Admitting: Internal Medicine

## 2013-11-15 ENCOUNTER — Encounter: Payer: Self-pay | Admitting: Pharmacist

## 2013-11-15 ENCOUNTER — Ambulatory Visit: Payer: Self-pay | Attending: Internal Medicine | Admitting: Pharmacist

## 2013-11-15 VITALS — BP 142/84 | HR 93 | Temp 99.2°F | Ht 69.0 in | Wt 214.6 lb

## 2013-11-15 DIAGNOSIS — E119 Type 2 diabetes mellitus without complications: Secondary | ICD-10-CM

## 2013-11-15 DIAGNOSIS — Z0000 Encounter for general adult medical examination without abnormal findings: Secondary | ICD-10-CM | POA: Insufficient documentation

## 2013-11-15 LAB — LIPID PANEL
Cholesterol: 234 mg/dL — ABNORMAL HIGH (ref 0–200)
HDL: 31 mg/dL — ABNORMAL LOW (ref >39)
LDL Cholesterol: 169 mg/dL — ABNORMAL HIGH (ref 0–99)
Total CHOL/HDL Ratio: 7.5 Ratio
Triglycerides: 170 mg/dL — ABNORMAL HIGH (ref <150)
VLDL: 34 mg/dL (ref 0–40)

## 2013-11-15 LAB — CBC WITH DIFFERENTIAL/PLATELET
BASOS ABS: 0.1 10*3/uL (ref 0.0–0.1)
Basophils Relative: 1 % (ref 0–1)
EOS PCT: 4 % (ref 0–5)
Eosinophils Absolute: 0.3 10*3/uL (ref 0.0–0.7)
HCT: 40.5 % (ref 39.0–52.0)
Hemoglobin: 14.1 g/dL (ref 13.0–17.0)
Lymphocytes Relative: 29 % (ref 12–46)
Lymphs Abs: 2.1 10*3/uL (ref 0.7–4.0)
MCH: 28.7 pg (ref 26.0–34.0)
MCHC: 34.8 g/dL (ref 30.0–36.0)
MCV: 82.5 fL (ref 78.0–100.0)
Monocytes Absolute: 0.4 10*3/uL (ref 0.1–1.0)
Monocytes Relative: 6 % (ref 3–12)
NEUTROS ABS: 4.3 10*3/uL (ref 1.7–7.7)
Neutrophils Relative %: 60 % (ref 43–77)
PLATELETS: 292 10*3/uL (ref 150–400)
RBC: 4.91 MIL/uL (ref 4.22–5.81)
RDW: 14 % (ref 11.5–15.5)
WBC: 7.1 10*3/uL (ref 4.0–10.5)

## 2013-11-15 LAB — POCT GLYCOSYLATED HEMOGLOBIN (HGB A1C): HEMOGLOBIN A1C: 5.6

## 2013-11-15 LAB — COMPREHENSIVE METABOLIC PANEL
ALBUMIN: 4.4 g/dL (ref 3.5–5.2)
ALT: 17 U/L (ref 0–53)
AST: 14 U/L (ref 0–37)
Alkaline Phosphatase: 44 U/L (ref 39–117)
BUN: 17 mg/dL (ref 6–23)
CO2: 23 meq/L (ref 19–32)
Calcium: 9.4 mg/dL (ref 8.4–10.5)
Chloride: 108 mEq/L (ref 96–112)
Creat: 0.92 mg/dL (ref 0.50–1.35)
GLUCOSE: 110 mg/dL — AB (ref 70–99)
Potassium: 4.3 mEq/L (ref 3.5–5.3)
SODIUM: 137 meq/L (ref 135–145)
TOTAL PROTEIN: 6.8 g/dL (ref 6.0–8.3)
Total Bilirubin: 0.3 mg/dL (ref 0.2–1.2)

## 2013-11-15 LAB — GLUCOSE, POCT (MANUAL RESULT ENTRY): POC GLUCOSE: 111 mg/dL — AB (ref 70–99)

## 2013-11-15 MED ORDER — GLIPIZIDE 5 MG PO TABS: 2.5000 mg | ORAL_TABLET | Freq: Every day | ORAL | Status: DC

## 2013-11-15 MED ORDER — METFORMIN HCL ER 500 MG PO TB24: 1000.0000 mg | ORAL_TABLET | Freq: Two times a day (BID) | ORAL | Status: DC

## 2013-11-15 MED ORDER — LISINOPRIL 5 MG PO TABS: 5.0000 mg | ORAL_TABLET | Freq: Every day | ORAL | Status: DC

## 2013-11-15 NOTE — Progress Notes (Signed)
S:    Patient arrives to the clinic for ambulatory blood pressure evaluation and A1C.  Medication compliance reports taking each day.  Current BP Medications include:  Lisinopril 5 mg  Current Diabetes Medications include:  Glipizide 2.5 mg, Metformin XR 500 mg 2 po BID  Antihypertensives tried in the past include: N/A  Patient returned to the clinic and reported no symptoms  O:  Last 3 Office BP readings: 150/88 mmHg 155/98 mmHg 147/91 mmHg  Today's Office BP reading: 142/84 mmHg   BMET    Component Value Date/Time   NA 139 04/04/2013 1235   K 4.6 04/04/2013 1235   CL 105 04/04/2013 1235   CO2 26 04/04/2013 1235   GLUCOSE 89 04/04/2013 1235   BUN 11 04/04/2013 1235   CREATININE 0.75 04/04/2013 1235   CREATININE 0.67 03/24/2013 0442   CALCIUM 9.6 04/04/2013 1235   GFRNONAA >90 03/24/2013 0442   GFRAA >90 03/24/2013 0442    A/P: 1. There are no changes at this time to BP medications.  BP is at goal this visit (<140/90).  2. Pt will have labs today: CMP, TSH, CBC, Lipids 3. Pt's A1C was at goal today (5.6) and no changes to current medications.  Pt did state that he feels the glipizide is causing low blood sugars once per week.  Pt told to keep track of his blood sugars and how often the low blood sugars are occurring.  If low blood sugars continue, may consider discontinuing glipizide.  He also states he has been working out more lately. 4. Pt will followup with PCP in 3 months.

## 2013-11-16 ENCOUNTER — Telehealth: Payer: Self-pay

## 2013-11-16 ENCOUNTER — Other Ambulatory Visit: Payer: Self-pay | Admitting: Pharmacist

## 2013-11-16 ENCOUNTER — Telehealth: Payer: Self-pay | Admitting: Pharmacist

## 2013-11-16 DIAGNOSIS — E119 Type 2 diabetes mellitus without complications: Secondary | ICD-10-CM

## 2013-11-16 LAB — TSH: TSH: 0.938 u[IU]/mL (ref 0.350–4.500)

## 2013-11-16 MED ORDER — ATORVASTATIN CALCIUM 20 MG PO TABS: 20.0000 mg | ORAL_TABLET | Freq: Every day | ORAL | Status: DC

## 2013-11-16 NOTE — Telephone Encounter (Signed)
Pt had labs yesterday.  His lipid levels were high.  Pt was non fasting but since pt is diabetic he should be on moderate dose of statin.  Talked with pt about new medication and he understood.  A rx for lipitor 20 mg qd was sent to his pharmacy.

## 2013-11-16 NOTE — Telephone Encounter (Signed)
Patient not available  Left message on machine to return our call 

## 2013-11-16 NOTE — Telephone Encounter (Signed)
Message copied by Lestine MountJUAREZ, Kyarra Vancamp L on Wed Nov 16, 2013  4:34 PM ------      Message from: Jeanann LewandowskyJEGEDE, OLUGBEMIGA E      Created: Wed Nov 16, 2013 11:09 AM       Please inform patient that his cholesterol level is very high and he needs to start a new medication. We also recommend low cholesterol, low fat diet and regular physical exercise at least 3 times a week, 30 minutes each time      Atorvastatin has been prescribed ------

## 2013-12-12 ENCOUNTER — Encounter: Payer: Self-pay | Attending: Internal Medicine | Admitting: *Deleted

## 2013-12-12 ENCOUNTER — Encounter: Payer: Self-pay | Admitting: *Deleted

## 2013-12-12 VITALS — Ht 69.0 in | Wt 198.0 lb

## 2013-12-12 DIAGNOSIS — E119 Type 2 diabetes mellitus without complications: Secondary | ICD-10-CM | POA: Insufficient documentation

## 2013-12-12 DIAGNOSIS — Z794 Long term (current) use of insulin: Secondary | ICD-10-CM | POA: Insufficient documentation

## 2013-12-12 DIAGNOSIS — Z713 Dietary counseling and surveillance: Secondary | ICD-10-CM | POA: Insufficient documentation

## 2013-12-12 NOTE — Progress Notes (Signed)
Appt start time: 1400 end time:  1530.   Assessment:  Patient was seen on  12/12/13 for individual diabetes education. He received some education in the hospital when he was diagnosed about a year ago.  He was discharged on insulin and prescribed 90g carbohydrates/meal.  He was taken off insulin in December and was not told how to adjust his carbohydrates.  Since then he's been following a  Low-carb diet.  He monitors his glucose twice daily (before breakfast and before dinner.  His readings are generally WNL  Current HbA1c: 5.6% on 11/15/13  Preferred Learning Style:   No preference indicated   Learning Readiness:   Change in progress  MEDICATIONS: see list.  Metformin 1000 mid and glipizide  DIETARY INTAKE:  Usual eating pattern includes 3 meals and 1-2 snacks per day.  Everyday foods include lean proteins, vegetables, starches.  Avoided foods include sweets.    24-hr recall:  B ( AM): special k red berries or hard boiled eggs.  Maybe glass juice .  Sometimes glucerna in addition Snk ( AM): grazing at work on proteins (bacon, ham, [place]key, etc)  L ( PM): whatever is at work: chicken parm, rice pilaf, and sugar snap peas Snk ( PM): sometimes cheese stick or peanuts D ( PM): leftovers from work.  Or meat, starch, veg: broccoli, mashed pot, and ground beef pattie Snk ( PM): every once in awhile: wheat thins or peanuts or almonds Beverages: water, some diet soda and unsweet tea, multiple cups of coffee: cream and splenda or sweet and low  Usual physical activity: works out 3-4 times/week: weight lifting and 30 minutes cardi machine. And sometimes circuit.    Estimated energy needs: 2000 calories 225 g carbohydrates 150 g protein 56 g fat  Progress Towards Goal(s):  Some progress.   Nutritional Diagnosis:  NB-1.1 Food and nutrition-related knowledge deficit As related to proper balance of fats, carbohydrates, and proteins for diabetes meal plan.  As evidenced by very strict diet  .    Intervention:  Nutrition counseling provided.  Discussed diabetes disease process and treatment options.  Discussed physiology of diabetes and role of obesity on insulin resistance.  Encouraged moderate weight reduction to improve glucose levels.  Discussed role of medications and diet in glucose control  Provided education on macronutrients on glucose levels.  Provided education on carb counting, importance of regularly scheduled meals/snacks, and meal planning  Discussed effects of physical activity on glucose levels and long-term glucose control.  Recommended 150 minutes of physical activity/week.  Reviewed patient medications.  Discussed role of medication on blood glucose and possible side effects  Discussed blood glucose monitoring and interpretation.  Discussed recommended target ranges and individual ranges.    Described short-term complications: hyper- and hypo-glycemia.  Discussed causes,symptoms, and treatment options.  Discussed prevention, detection, and treatment of long-term complications.  Discussed the role of prolonged elevated glucose levels on body systems.  Discussed role of stress on blood glucose levels and discussed strategies to manage psychosocial issues.  Discussed recommendations for long-term diabetes self-care.  Established checklist for medical, dental, and emotional self-care.  Teaching Method Utilized:  Visual Auditory Hands on  Handouts given during visit include: Living Well with Diabetes Carb Counting and Food Label handouts Meal Plan Card   Barriers to learning/adherence to lifestyle change: none  Diabetes self-care support plan:  Sister  Demonstrated degree of understanding via:  Teach Back   Monitoring/Evaluation:  Dietary intake, exercise, HgA1c, and body weight prn.

## 2014-02-21 ENCOUNTER — Ambulatory Visit: Payer: Self-pay | Admitting: Internal Medicine

## 2014-04-24 ENCOUNTER — Other Ambulatory Visit: Payer: Self-pay | Admitting: Internal Medicine

## 2014-04-24 DIAGNOSIS — E119 Type 2 diabetes mellitus without complications: Secondary | ICD-10-CM

## 2014-05-01 ENCOUNTER — Ambulatory Visit: Payer: BC Managed Care – PPO | Attending: Internal Medicine | Admitting: Internal Medicine

## 2014-05-01 ENCOUNTER — Encounter: Payer: Self-pay | Admitting: Internal Medicine

## 2014-05-01 VITALS — BP 141/83 | HR 91 | Temp 98.3°F | Resp 16 | Ht 69.0 in | Wt 226.0 lb

## 2014-05-01 DIAGNOSIS — E119 Type 2 diabetes mellitus without complications: Secondary | ICD-10-CM | POA: Insufficient documentation

## 2014-05-01 DIAGNOSIS — I10 Essential (primary) hypertension: Secondary | ICD-10-CM | POA: Diagnosis not present

## 2014-05-01 LAB — POCT GLYCOSYLATED HEMOGLOBIN (HGB A1C): Hemoglobin A1C: 6.6

## 2014-05-01 MED ORDER — GLIPIZIDE 5 MG PO TABS: 5.0000 mg | ORAL_TABLET | Freq: Every day | ORAL | Status: DC

## 2014-05-01 NOTE — Progress Notes (Signed)
Patient ID: Johnny Nichols, male   DOB: April 23, 1966, 48 y.o.   MRN: [id number]   Sherwin Hollingshed, is a 48 y.o. male  [id number]:[id number]  [id number]  DOB - Apr 29, 1966  Chief Complaint  Patient presents with  . Follow-up  . Hypertension  . Diabetes        Subjective:   Johnny Nichols is a 48 y.o. male here today for a follow up visit. Patient has diabetes mellitus type 2 on glipizide and metformin, hypertension on lisinopril here today for routine follow-up. Patient is dieting and exercising 3 times a week, patient is compliant with medication, needs refill on glipizide. He has not been taking his statin as prescribed. He claims his blood pressure is controlled as well as his blood sugar at home. Patient has No headache, No chest pain, No abdominal pain - No Nausea, No new weakness tingling or numbness, No Cough - SOB.  Problem  Type II Or Unspecified Type Diabetes Mellitus Without Mention of Complication, Not Stated As Uncontrolled    ALLERGIES: Allergies  Allergen Reactions  . Clindamycin/Lincomycin     PAST MEDICAL HISTORY: Past Medical History  Diagnosis Date  . Sleep apnea   . Diabetes mellitus without complication   . Hypertension     MEDICATIONS AT HOME: Prior to Admission medications   Medication Sig Start Date End Date Taking? Authorizing Provider  glipiZIDE (GLUCOTROL) 5 MG tablet Take 1 tablet (5 mg total) by mouth daily before breakfast. 05/01/14  Yes Devaris Quirk E Hyman Hopes, MD  lisinopril (PRINIVIL,ZESTRIL) 5 MG tablet Take 1 tablet (5 mg total) by mouth daily. 11/15/13  Yes Quentin Angst, MD  metFORMIN (GLUCOPHAGE-XR) 500 MG 24 hr tablet Take 1,000 mg by mouth 2 (two) times daily with a meal. 11/15/13  Yes Herchel Hopkin E Hyman Hopes, MD  atorvastatin (LIPITOR) 20 MG tablet Take 1 tablet (20 mg total) by mouth daily. 11/16/13   Quentin Angst, MD     Objective:   Filed Vitals:   05/01/14 1511  BP: 141/83  Pulse: 91  Temp: 98.3 F (36.8 C)    TempSrc: Oral  Resp: 16  Height:  (1.753 m)  Weight: 226 lb (102.513 kg)  SpO2: 98%    Exam General appearance : Awake, alert, not in any distress. Speech Clear. Not toxic looking HEENT: Atraumatic and Normocephalic, pupils equally reactive to light and accomodation Neck: supple, no JVD. No cervical lymphadenopathy.  Chest:Good air entry bilaterally, no added sounds  CVS: S1 S2 regular, no murmurs.  Abdomen: Bowel sounds present, Non tender and not distended with no gaurding, rigidity or rebound. Extremities: B/L Lower Ext shows no edema, both legs are warm to touch Neurology: Awake alert, and oriented X 3, CN II-XII intact, Non focal Skin:No Rash Wounds:N/A  Data Review Lab Results  Component Value Date   HGBA1C 6.6 05/01/2014   HGBA1C 5.6 11/15/2013   HGBA1C 5.6 07/19/2013     Assessment & Plan   1. Type II or unspecified type diabetes mellitus without mention of complication, not stated as uncontrolled  - HgB A1c is 6.6% today  - glipiZIDE (GLUCOTROL) 5 MG tablet; Take 1 tablet (5 mg total) by mouth daily before breakfast.  Dispense: 90 tablet; Refill: 3  Aim for 2-3 Carb Choices per meal (30-45 grams) +/- 1 either way  Aim for 0-15 Carbs per snack if hungry  Include protein in moderation with your meals and snacks  Consider reading food labels for Total Carbohydrate and Fat Grams of foods  Consider  checking BG at alternate times per day  Continue taking medication as directed Fruit Punch - find one with no sugar  Measure and decrease portions of carbohydrate foods  Make your plate and don't go back for seconds  Return in about 3 months (around 07/31/2014), or if symptoms worsen or fail to improve, for Hemoglobin A1C and Follow up, DM.  The patient was given clear instructions to go to ER or return to medical center if symptoms don't improve, worsen or new problems develop. The patient verbalized understanding. The patient was told to call to get lab results if  they haven't heard anything in the next week.   This note has been created with Education officer, environmental. Any transcriptional errors are unintentional.    Jeanann Lewandowsky, MD, MHA, FACP, FAAP [facility] [place], [place] [phone]   05/01/2014, 3:51 PM

## 2014-05-01 NOTE — Patient Instructions (Signed)
Diabetes and Exercise Exercising regularly is important. It is not just about losing weight. It has many health benefits, such as:  Improving your overall fitness, flexibility, and endurance.  Increasing your bone density.  Helping with weight control.  Decreasing your body fat.  Increasing your muscle strength.  Reducing stress and tension.  Improving your overall health. People with diabetes who exercise gain additional benefits because exercise:  Reduces appetite.  Improves the body's use of blood sugar (glucose).  Helps lower or control blood glucose.  Decreases blood pressure.  Helps control blood lipids (such as cholesterol and triglycerides).  Improves the body's use of the hormone insulin by:  Increasing the body's insulin sensitivity.  Reducing the body's insulin needs.  Decreases the risk for heart disease because exercising:  Lowers cholesterol and triglycerides levels.  Increases the levels of good cholesterol (such as high-density lipoproteins [HDL]) in the body.  Lowers blood glucose levels. YOUR ACTIVITY PLAN  Choose an activity that you enjoy and set realistic goals. Your health care provider or diabetes educator can help you make an activity plan that works for you. Exercise regularly as directed by your health care provider. This includes:  Performing resistance training twice a week such as push-ups, sit-ups, lifting weights, or using resistance bands.  Performing 150 minutes of cardio exercises each week such as walking, running, or playing sports.  Staying active and spending no more than 90 minutes at one time being inactive. Even short bursts of exercise are good for you. Three 10-minute sessions spread throughout the day are just as beneficial as a single 30-minute session. Some exercise ideas include:  Taking the dog for a walk.  Taking the stairs instead of the elevator.  Dancing to your favorite song.  Doing an exercise  video.  Doing your favorite exercise with a friend. RECOMMENDATIONS FOR EXERCISING WITH TYPE 1 OR TYPE 2 DIABETES   Check your blood glucose before exercising. If blood glucose levels are greater than 240 mg/dL, check for urine ketones. Do not exercise if ketones are present.  Avoid injecting insulin into areas of the body that are going to be exercised. For example, avoid injecting insulin into:  The arms when playing tennis.  The legs when jogging.  Keep a record of:  Food intake before and after you exercise.  Expected peak times of insulin action.  Blood glucose levels before and after you exercise.  The type and amount of exercise you have done.  Review your records with your health care provider. Your health care provider will help you to develop guidelines for adjusting food intake and insulin amounts before and after exercising.  If you take insulin or oral hypoglycemic agents, watch for signs and symptoms of hypoglycemia. They include:  Dizziness.  Shaking.  Sweating.  Chills.  Confusion.  Drink plenty of water while you exercise to prevent dehydration or heat stroke. Body water is lost during exercise and must be replaced.  Talk to your health care provider before starting an exercise program to make sure it is safe for you. Remember, almost any type of activity is better than none. Document Released: 11/08/2003 Document Revised: 01/02/2014 Document Reviewed: 01/25/2013 ExitCare Patient Information 2015 [employer]. This information is not intended to replace advice given to you by your health care provider. Make sure you discuss any questions you have with your health care provider. Diabetes Mellitus and Food It is important for you to manage your blood sugar (glucose) level. Your blood glucose level can   be greatly affected by what you eat. Eating healthier foods in the appropriate amounts throughout the day at about the same time each day will help you  control your blood glucose level. It can also help slow or prevent worsening of your diabetes mellitus. Healthy eating may even help you improve the level of your blood pressure and reach or maintain a healthy weight.  HOW CAN FOOD AFFECT ME? Carbohydrates Carbohydrates affect your blood glucose level more than any other type of food. Your dietitian will help you determine how many carbohydrates to eat at each meal and teach you how to count carbohydrates. Counting carbohydrates is important to keep your blood glucose at a healthy level, especially if you are using insulin or taking certain medicines for diabetes mellitus. Alcohol Alcohol can cause sudden decreases in blood glucose (hypoglycemia), especially if you use insulin or take certain medicines for diabetes mellitus. Hypoglycemia can be a life-threatening condition. Symptoms of hypoglycemia (sleepiness, dizziness, and disorientation) are similar to symptoms of having too much alcohol.  If your health care provider has given you approval to drink alcohol, do so in moderation and use the following guidelines:  Women should not have more than one drink per day, and men should not have more than two drinks per day. One drink is equal to:  12 oz of beer.  5 oz of wine.  1 oz of hard liquor.  Do not drink on an empty stomach.  Keep yourself hydrated. Have water, diet soda, or unsweetened iced tea.  Regular soda, juice, and other mixers might contain a lot of carbohydrates and should be counted. WHAT FOODS ARE NOT RECOMMENDED? As you make food choices, it is important to remember that all foods are not the same. Some foods have fewer nutrients per serving than other foods, even though they might have the same number of calories or carbohydrates. It is difficult to get your body what it needs when you eat foods with fewer nutrients. Examples of foods that you should avoid that are high in calories and carbohydrates but low in nutrients  include:  Trans fats (most processed foods list trans fats on the Nutrition Facts label).  Regular soda.  Juice.  Candy.  Sweets, such as cake, pie, doughnuts, and cookies.  Fried foods. WHAT FOODS CAN I EAT? Have nutrient-rich foods, which will nourish your body and keep you healthy. The food you should eat also will depend on several factors, including:  The calories you need.  The medicines you take.  Your weight.  Your blood glucose level.  Your blood pressure level.  Your cholesterol level. You also should eat a variety of foods, including:  Protein, such as meat, poultry, fish, tofu, nuts, and seeds (lean animal proteins are best).  Fruits.  Vegetables.  Dairy products, such as milk, cheese, and yogurt (low fat is best).  Breads, grains, pasta, cereal, rice, and beans.  Fats such as olive oil, trans fat-free margarine, canola oil, avocado, and olives. DOES EVERYONE WITH DIABETES MELLITUS HAVE THE SAME MEAL PLAN? Because every person with diabetes mellitus is different, there is not one meal plan that works for everyone. It is very important that you meet with a dietitian who will help you create a meal plan that is just right for you. Document Released: 05/15/2005 Document Revised: 08/23/2013 Document Reviewed: 07/15/2013 ExitCare Patient Information 2015 [employer]. This information is not intended to replace advice given to you by your health care provider. Make sure you discuss any   questions you have with your health care provider.  

## 2014-05-01 NOTE — Progress Notes (Signed)
Pt here to f/u Diabetes,A1c Pt is taking prescribed medications daily  Refuses to take medication for Cholesterol CBg- 112 Pt is dieting/exercising 3 times/week Denies blurry vision,dizziness or headache

## 2014-06-23 ENCOUNTER — Other Ambulatory Visit: Payer: Self-pay | Admitting: Internal Medicine

## 2014-08-03 ENCOUNTER — Other Ambulatory Visit: Payer: Self-pay | Admitting: Internal Medicine

## 2014-08-07 ENCOUNTER — Telehealth: Payer: Self-pay

## 2014-08-07 ENCOUNTER — Other Ambulatory Visit: Payer: Self-pay

## 2014-08-07 DIAGNOSIS — I10 Essential (primary) hypertension: Secondary | ICD-10-CM

## 2014-08-07 MED ORDER — LISINOPRIL 5 MG PO TABS: 5.0000 mg | ORAL_TABLET | Freq: Every day | ORAL | Status: DC

## 2014-08-07 MED ORDER — METFORMIN HCL ER 500 MG PO TB24: 1000.0000 mg | ORAL_TABLET | Freq: Two times a day (BID) | ORAL | Status: DC

## 2014-08-07 NOTE — Telephone Encounter (Signed)
Patient called requesting a refill on his metformin and lisinopril [facility]

## 2014-08-08 IMAGING — CR DG CHEST 1V PORT
2 series · 2 of 2 positions shown · non-contrast
Comparison: None.

CLINICAL DATA: Shortness of breath.

PORTABLE CHEST - 1 VIEW

[view not recorded (1 of 2)]
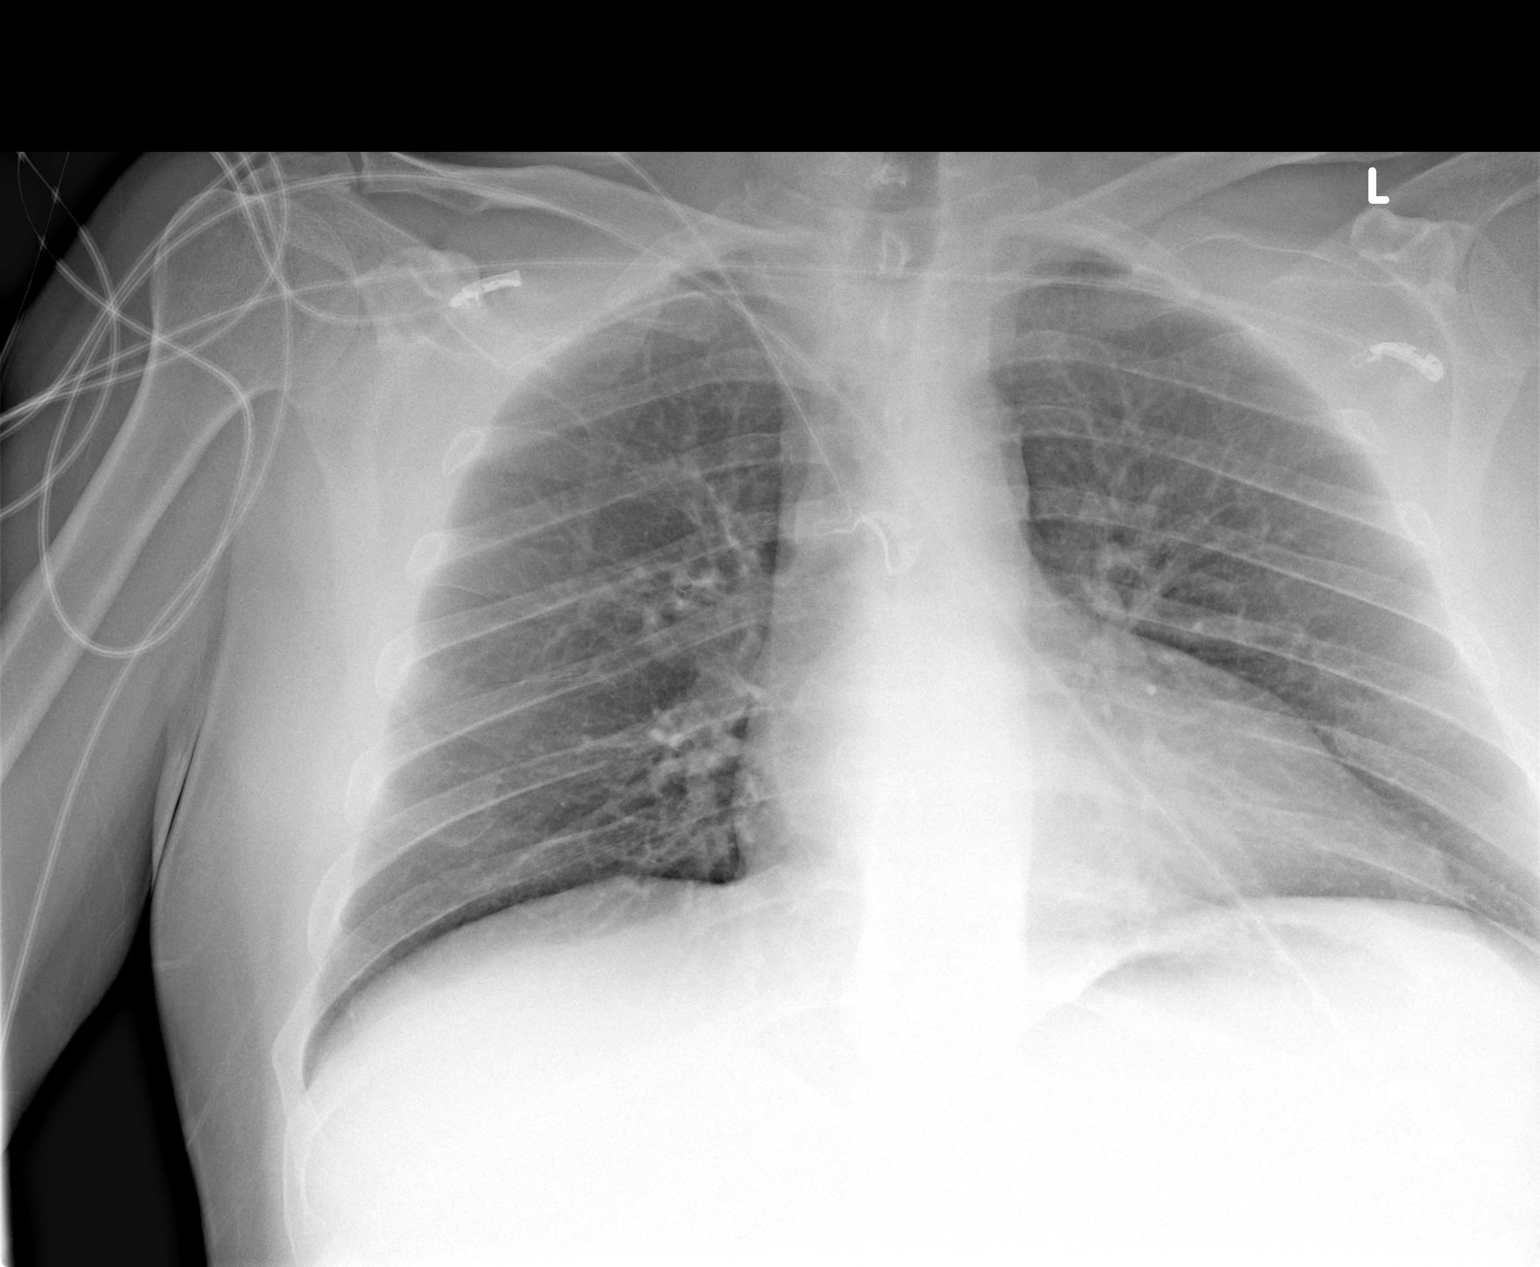

[view not recorded (2 of 2)]
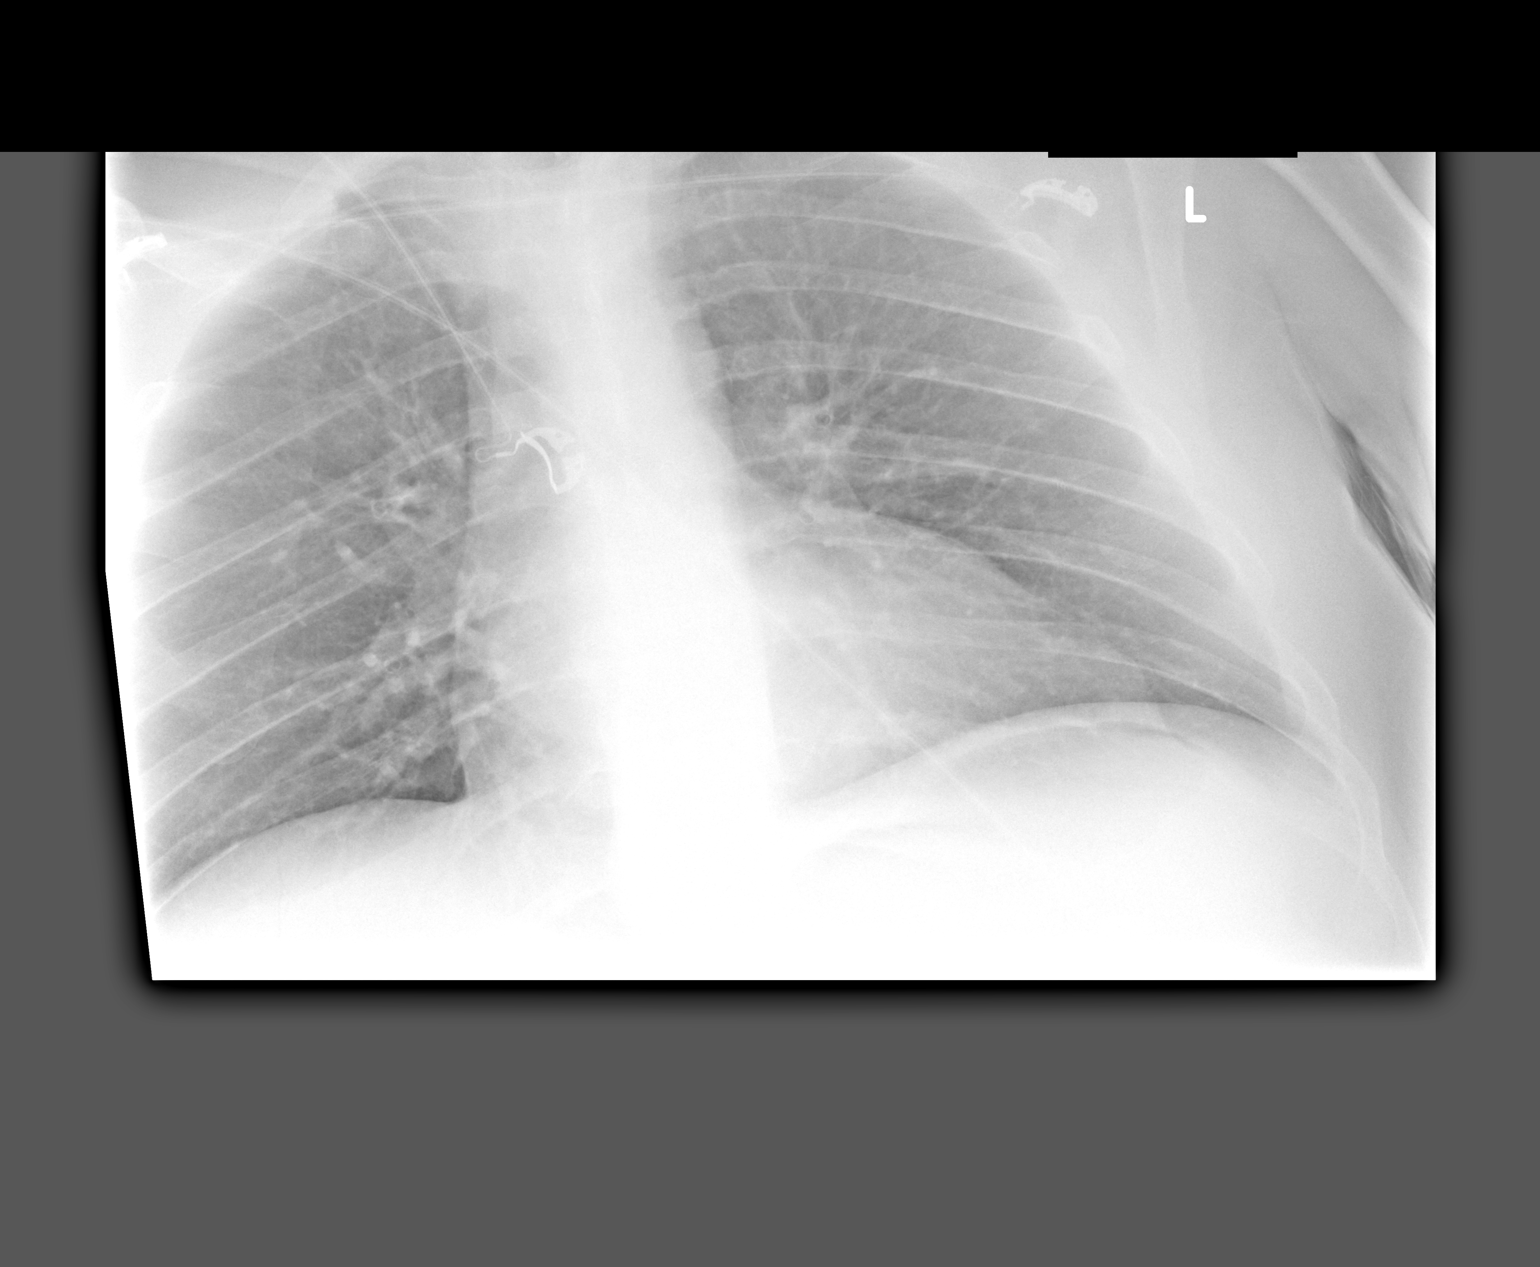

[2 of 2 positions shown; findings below may reference images not displayed]

FINDINGS: Portable view of the chest was obtained.  The lungs are
clear. Heart and mediastinum are within normal limits.  The trachea
is midline.  Bony thorax is intact.
IMPRESSION: No acute chest findings.

## 2014-08-14 ENCOUNTER — Ambulatory Visit: Payer: BC Managed Care – PPO | Attending: Internal Medicine | Admitting: Internal Medicine

## 2014-08-14 ENCOUNTER — Encounter: Payer: Self-pay | Admitting: Internal Medicine

## 2014-08-14 VITALS — BP 132/92 | HR 90 | Temp 98.9°F | Resp 18 | Ht 69.0 in | Wt 223.0 lb

## 2014-08-14 DIAGNOSIS — I10 Essential (primary) hypertension: Secondary | ICD-10-CM

## 2014-08-14 DIAGNOSIS — E1110 Type 2 diabetes mellitus with ketoacidosis without coma: Secondary | ICD-10-CM

## 2014-08-14 DIAGNOSIS — Z23 Encounter for immunization: Secondary | ICD-10-CM | POA: Diagnosis not present

## 2014-08-14 DIAGNOSIS — E119 Type 2 diabetes mellitus without complications: Secondary | ICD-10-CM

## 2014-08-14 DIAGNOSIS — E1310 Other specified diabetes mellitus with ketoacidosis without coma: Secondary | ICD-10-CM | POA: Insufficient documentation

## 2014-08-14 LAB — GLUCOSE, POCT (MANUAL RESULT ENTRY): POC Glucose: 111 mg/dl — AB (ref 70–99)

## 2014-08-14 LAB — POCT GLYCOSYLATED HEMOGLOBIN (HGB A1C): HEMOGLOBIN A1C: 6.2

## 2014-08-14 MED ORDER — GLIPIZIDE 5 MG PO TABS: 5.0000 mg | ORAL_TABLET | Freq: Every day | ORAL | Status: DC

## 2014-08-14 MED ORDER — LISINOPRIL 5 MG PO TABS
5.0000 mg | ORAL_TABLET | Freq: Every day | ORAL | Status: DC
Start: 2014-08-14 — End: 2015-09-20

## 2014-08-14 MED ORDER — METFORMIN HCL 1000 MG PO TABS
1000.0000 mg | ORAL_TABLET | Freq: Two times a day (BID) | ORAL | Status: DC
Start: 2014-08-14 — End: 2015-09-20

## 2014-08-14 NOTE — Progress Notes (Signed)
Patient ID: Johnny Nichols, male   DOB: 08-29-1966, 48 y.o.   MRN: [id number]   Johnny Nichols, is a 48 y.o. male  [id number]  [id number]  DOB - 08-29-1966  Chief Complaint  Patient presents with  . Follow-up  . Diabetes  . Hypertension        Subjective:   Johnny Nichols is a 48 y.o. male here today for a follow up visit. Patient has history of hypertension and diabetes mellitus type 2, here today for routine follow-up and medication management. Patient claims compliant with diet, exercise and medications. He reports no side effects. He denies blurry vision or dizziness. CBG today is 111. Blood pressure is controlled on low-dose ACE inhibitor but patient has not taken her medication this morning. Patient has No headache, No chest pain, No abdominal pain - No Nausea, No new weakness tingling or numbness, No Cough - SOB.  No problems updated.  ALLERGIES: Allergies  Allergen Reactions  . Clindamycin/Lincomycin     PAST MEDICAL HISTORY: Past Medical History  Diagnosis Date  . Sleep apnea   . Diabetes mellitus without complication   . Hypertension     MEDICATIONS AT HOME: Prior to Admission medications   Medication Sig Start Date End Date Taking? Authorizing Provider  glipiZIDE (GLUCOTROL) 5 MG tablet Take 1 tablet (5 mg total) by mouth daily before breakfast. 08/14/14  Yes Kraig Genis E Hyman HopesJegede, MD  lisinopril (PRINIVIL,ZESTRIL) 5 MG tablet Take 1 tablet (5 mg total) by mouth daily. 08/14/14  Yes Quentin Angstlugbemiga E Shayle Donahoo, MD  metFORMIN (GLUCOPHAGE) 1000 MG tablet Take 1 tablet (1,000 mg total) by mouth 2 (two) times daily with a meal. 08/14/14   Quentin Angstlugbemiga E Alasia Enge, MD     Objective:   Filed Vitals:   08/14/14 1721  BP: 132/92  Pulse: 90  Temp: 98.9 F (37.2 C)  TempSrc: Oral  Resp: 18  Height: 5\' 9"  (1.753 m)  Weight: 223 lb (101.152 kg)  SpO2: 98%    Exam General appearance : Awake, alert, not in any distress. Speech Clear. Not toxic  looking HEENT: Atraumatic and Normocephalic, pupils equally reactive to light and accomodation Neck: supple, no JVD. No cervical lymphadenopathy.  Chest:Good air entry bilaterally, no added sounds  CVS: S1 S2 regular, no murmurs.  Abdomen: Bowel sounds present, Non tender and not distended with no gaurding, rigidity or rebound. Extremities: B/L Lower Ext shows no edema, both legs are warm to touch Neurology: Awake alert, and oriented X 3, CN II-XII intact, Non focal Skin:No Rash Wounds:N/A  Data Review Lab Results  Component Value Date   HGBA1C 6.2 08/14/2014   HGBA1C 6.6 05/01/2014   HGBA1C 5.6 11/15/2013     Assessment & Plan   1. Type 2 diabetes mellitus, controlled  - HgB A1c is 6.2% today. - Glucose (CBG)  2. Type 2 diabetes mellitus without complication  - glipiZIDE (GLUCOTROL) 5 MG tablet; Take 1 tablet (5 mg total) by mouth daily before breakfast.  Dispense: 90 tablet; Refill: 3 - metFORMIN (GLUCOPHAGE) 1000 MG tablet; Take 1 tablet (1,000 mg total) by mouth 2 (two) times daily with a meal.  Dispense: 180 tablet; Refill: 3  Aim for 30 minutes of exercise most days. Rethink what you drink. Water is great! Aim for 2-3 Carb Choices per meal (30-45 grams) +/- 1 either way  Aim for 0-15 Carbs per snack if hungry  Include protein in moderation with your meals and snacks  Consider reading food labels for Total Carbohydrate and Fat Grams of  foods  Consider checking BG at alternate times per day  Continue taking medication as directed Be mindful about how much sugar you are adding to beverages and other foods. Fruit Punch - find one with no sugar  Measure and decrease portions of carbohydrate foods  Make your plate and don't go back for seconds  3. Essential hypertension  - lisinopril (PRINIVIL,ZESTRIL) 5 MG tablet; Take 1 tablet (5 mg total) by mouth daily.  Dispense: 90 tablet; Refill: 3  We have discussed target BP range and blood pressure goal. I have advised  patient to check BP regularly and to call us back or report to clinic if the numbers are consistently higher than 140/90. We discussed the importance of compliance with medical therapy and DASH diet recommended, consequences of uncontrolled hypertension discussed.  - continue current BP medications  Return in about 6 months (around 02/13/2015), or if symptoms worsen or fail to improve, for Follow up HTN, Hemoglobin A1C and Follow up, DM.  The patient was given clear instructions to go to ER or return to medical center if symptoms don't improve, worsen or new problems develop. The patient verbalized understanding. The patient was told to call to get lab results if they haven't heard anything in the next week.   This note has been created with Education officer, environmentalDragon speech recognition software and smart phrase technology. Any transcriptional errors are unintentional.    Jeanann LewandowskyJEGEDE, Kaizer Dissinger, MD, MHA, FACP, FAAP [facility] [place], [place] [phone]   08/14/2014, 5:37 PM

## 2014-08-14 NOTE — Patient Instructions (Signed)
Diabetes and Exercise Exercising regularly is important. It is not just about losing weight. It has many health benefits, such as:  Improving your overall fitness, flexibility, and endurance.  Increasing your bone density.  Helping with weight control.  Decreasing your body fat.  Increasing your muscle strength.  Reducing stress and tension.  Improving your overall health. People with diabetes who exercise gain additional benefits because exercise:  Reduces appetite.  Improves the body's use of blood sugar (glucose).  Helps lower or control blood glucose.  Decreases blood pressure.  Helps control blood lipids (such as cholesterol and triglycerides).  Improves the body's use of the hormone insulin by:  Increasing the body's insulin sensitivity.  Reducing the body's insulin needs.  Decreases the risk for heart disease because exercising:  Lowers cholesterol and triglycerides levels.  Increases the levels of good cholesterol (such as high-density lipoproteins [HDL]) in the body.  Lowers blood glucose levels. YOUR ACTIVITY PLAN  Choose an activity that you enjoy and set realistic goals. Your health care provider or diabetes educator can help you make an activity plan that works for you. Exercise regularly as directed by your health care provider. This includes:  Performing resistance training twice a week such as push-ups, sit-ups, lifting weights, or using resistance bands.  Performing 150 minutes of cardio exercises each week such as walking, running, or playing sports.  Staying active and spending no more than 90 minutes at one time being inactive. Even short bursts of exercise are good for you. Three 10-minute sessions spread throughout the day are just as beneficial as a single 30-minute session. Some exercise ideas include:  Taking the dog for a walk.  Taking the stairs instead of the elevator.  Dancing to your favorite song.  Doing an exercise  video.  Doing your favorite exercise with a friend. RECOMMENDATIONS FOR EXERCISING WITH TYPE 1 OR TYPE 2 DIABETES   Check your blood glucose before exercising. If blood glucose levels are greater than 240 mg/dL, check for urine ketones. Do not exercise if ketones are present.  Avoid injecting insulin into areas of the body that are going to be exercised. For example, avoid injecting insulin into:  The arms when playing tennis.  The legs when jogging.  Keep a record of:  Food intake before and after you exercise.  Expected peak times of insulin action.  Blood glucose levels before and after you exercise.  The type and amount of exercise you have done.  Review your records with your health care provider. Your health care provider will help you to develop guidelines for adjusting food intake and insulin amounts before and after exercising.  If you take insulin or oral hypoglycemic agents, watch for signs and symptoms of hypoglycemia. They include:  Dizziness.  Shaking.  Sweating.  Chills.  Confusion.  Drink plenty of water while you exercise to prevent dehydration or heat stroke. Body water is lost during exercise and must be replaced.  Talk to your health care provider before starting an exercise program to make sure it is safe for you. Remember, almost any type of activity is better than none. Document Released: 11/08/2003 Document Revised: 01/02/2014 Document Reviewed: 01/25/2013 ExitCare Patient Information 2015 [employer]. This information is not intended to replace advice given to you by your health care provider. Make sure you discuss any questions you have with your health care provider. Diabetes Mellitus and Food It is important for you to manage your blood sugar (glucose) level. Your blood glucose level can   be greatly affected by what you eat. Eating healthier foods in the appropriate amounts throughout the day at about the same time each day will help you  control your blood glucose level. It can also help slow or prevent worsening of your diabetes mellitus. Healthy eating may even help you improve the level of your blood pressure and reach or maintain a healthy weight.  HOW CAN FOOD AFFECT ME? Carbohydrates Carbohydrates affect your blood glucose level more than any other type of food. Your dietitian will help you determine how many carbohydrates to eat at each meal and teach you how to count carbohydrates. Counting carbohydrates is important to keep your blood glucose at a healthy level, especially if you are using insulin or taking certain medicines for diabetes mellitus. Alcohol Alcohol can cause sudden decreases in blood glucose (hypoglycemia), especially if you use insulin or take certain medicines for diabetes mellitus. Hypoglycemia can be a life-threatening condition. Symptoms of hypoglycemia (sleepiness, dizziness, and disorientation) are similar to symptoms of having too much alcohol.  If your health care provider has given you approval to drink alcohol, do so in moderation and use the following guidelines:  Women should not have more than one drink per day, and men should not have more than two drinks per day. One drink is equal to:  12 oz of beer.  5 oz of wine.  1 oz of hard liquor.  Do not drink on an empty stomach.  Keep yourself hydrated. Have water, diet soda, or unsweetened iced tea.  Regular soda, juice, and other mixers might contain a lot of carbohydrates and should be counted. WHAT FOODS ARE NOT RECOMMENDED? As you make food choices, it is important to remember that all foods are not the same. Some foods have fewer nutrients per serving than other foods, even though they might have the same number of calories or carbohydrates. It is difficult to get your body what it needs when you eat foods with fewer nutrients. Examples of foods that you should avoid that are high in calories and carbohydrates but low in nutrients  include:  Trans fats (most processed foods list trans fats on the Nutrition Facts label).  Regular soda.  Juice.  Candy.  Sweets, such as cake, pie, doughnuts, and cookies.  Fried foods. WHAT FOODS CAN I EAT? Have nutrient-rich foods, which will nourish your body and keep you healthy. The food you should eat also will depend on several factors, including:  The calories you need.  The medicines you take.  Your weight.  Your blood glucose level.  Your blood pressure level.  Your cholesterol level. You also should eat a variety of foods, including:  Protein, such as meat, poultry, fish, tofu, nuts, and seeds (lean animal proteins are best).  Fruits.  Vegetables.  Dairy products, such as milk, cheese, and yogurt (low fat is best).  Breads, grains, pasta, cereal, rice, and beans.  Fats such as olive oil, trans fat-free margarine, canola oil, avocado, and olives. DOES EVERYONE WITH DIABETES MELLITUS HAVE THE SAME MEAL PLAN? Because every person with diabetes mellitus is different, there is not one meal plan that works for everyone. It is very important that you meet with a dietitian who will help you create a meal plan that is just right for you. Document Released: 05/15/2005 Document Revised: 08/23/2013 Document Reviewed: 07/15/2013 ExitCare Patient Information 2015 [employer]. This information is not intended to replace advice given to you by your health care provider. Make sure you discuss any   questions you have with your health care provider. DASH Eating Plan DASH stands for "Dietary Approaches to Stop Hypertension." The DASH eating plan is a healthy eating plan that has been shown to reduce high blood pressure (hypertension). Additional health benefits may include reducing the risk of type 2 diabetes mellitus, heart disease, and stroke. The DASH eating plan may also help with weight loss. WHAT DO I NEED TO KNOW ABOUT THE DASH EATING PLAN? For the DASH eating plan,  you will follow these general guidelines:  Choose foods with a percent daily value for sodium of less than 5% (as listed on the food label).  Use salt-free seasonings or herbs instead of table salt or sea salt.  Check with your health care provider or pharmacist before using salt substitutes.  Eat lower-sodium products, often labeled as "lower sodium" or "no salt added."  Eat fresh foods.  Eat more vegetables, fruits, and low-fat dairy products.  Choose whole grains. Look for the word "whole" as the first word in the ingredient list.  Choose fish and skinless chicken or [place] more often than red meat. Limit fish, poultry, and meat to 6 oz (170 g) each day.  Limit sweets, desserts, sugars, and sugary drinks.  Choose heart-healthy fats.  Limit cheese to 1 oz (28 g) per day.  Eat more home-cooked food and less restaurant, buffet, and fast food.  Limit fried foods.  Cook foods using methods other than frying.  Limit canned vegetables. If you do use them, rinse them well to decrease the sodium.  When eating at a restaurant, ask that your food be prepared with less salt, or no salt if possible. WHAT FOODS CAN I EAT? Seek help from a dietitian for individual calorie needs. Grains Whole grain or whole wheat bread. Brown rice. Whole grain or whole wheat pasta. Quinoa, bulgur, and whole grain cereals. Low-sodium cereals. Corn or whole wheat flour tortillas. Whole grain cornbread. Whole grain crackers. Low-sodium crackers. Vegetables Fresh or frozen vegetables (raw, steamed, roasted, or grilled). Low-sodium or reduced-sodium tomato and vegetable juices. Low-sodium or reduced-sodium tomato sauce and paste. Low-sodium or reduced-sodium canned vegetables.  Fruits All fresh, canned (in natural juice), or frozen fruits. Meat and Other Protein Products Ground beef (85% or leaner), grass-fed beef, or beef trimmed of fat. Skinless chicken or [place]. Ground chicken or [place]. Pork trimmed of  fat. All fish and seafood. Eggs. Dried beans, peas, or lentils. Unsalted nuts and seeds. Unsalted canned beans. Dairy Low-fat dairy products, such as skim or 1% milk, 2% or reduced-fat cheeses, low-fat ricotta or cottage cheese, or plain low-fat yogurt. Low-sodium or reduced-sodium cheeses. Fats and Oils Tub margarines without trans fats. Light or reduced-fat mayonnaise and salad dressings (reduced sodium). Avocado. Safflower, olive, or canola oils. Natural peanut or almond butter. Other Unsalted popcorn and pretzels. The items listed above may not be a complete list of recommended foods or beverages. Contact your dietitian for more options. WHAT FOODS ARE NOT RECOMMENDED? Grains White bread. White pasta. White rice. Refined cornbread. Bagels and croissants. Crackers that contain trans fat. Vegetables Creamed or fried vegetables. Vegetables in a cheese sauce. Regular canned vegetables. Regular canned tomato sauce and paste. Regular tomato and vegetable juices. Fruits Dried fruits. Canned fruit in light or heavy syrup. Fruit juice. Meat and Other Protein Products Fatty cuts of meat. Ribs, chicken wings, bacon, sausage, bologna, salami, chitterlings, fatback, hot dogs, bratwurst, and packaged luncheon meats. Salted nuts and seeds. Canned beans with salt. Dairy Whole or 2% milk, cream,  half-and-half, and cream cheese. Whole-fat or sweetened yogurt. Full-fat cheeses or blue cheese. Nondairy creamers and whipped toppings. Processed cheese, cheese spreads, or cheese curds. Condiments Onion and garlic salt, seasoned salt, table salt, and sea salt. Canned and packaged gravies. Worcestershire sauce. Tartar sauce. Barbecue sauce. Teriyaki sauce. Soy sauce, including reduced sodium. Steak sauce. Fish sauce. Oyster sauce. Cocktail sauce. Horseradish. Ketchup and mustard. Meat flavorings and tenderizers. Bouillon cubes. Hot sauce. Tabasco sauce. Marinades. Taco seasonings. Relishes. Fats and Oils Butter,  stick margarine, lard, shortening, ghee, and bacon fat. Coconut, palm kernel, or palm oils. Regular salad dressings. Other Pickles and olives. Salted popcorn and pretzels. The items listed above may not be a complete list of foods and beverages to avoid. Contact your dietitian for more information. WHERE CAN I FIND MORE INFORMATION? National Heart, Lung, and Blood Institute: CablePromo.itwww.nhlbi.nih.gov/health/health-topics/topics/dash/ Document Released: 08/07/2011 Document Revised: 01/02/2014 Document Reviewed: 06/22/2013 [facility] Patient Information 2015 [employer], [place]. This information is not intended to replace advice given to you by your health care provider. Make sure you discuss any questions you have with your health care provider.

## 2014-08-14 NOTE — Progress Notes (Signed)
Pt comes in for 3 month f/u DM,Htn with medical management Pt is compliant with diet/exercise, works out 3 times/week Denies blurred vision or dizziness Denies pain at this time CBg-111/ A1c-6.2

## 2014-08-17 ENCOUNTER — Ambulatory Visit: Payer: BC Managed Care – PPO | Attending: Internal Medicine | Admitting: *Deleted

## 2014-08-17 DIAGNOSIS — Z23 Encounter for immunization: Secondary | ICD-10-CM

## 2015-08-09 ENCOUNTER — Ambulatory Visit: Payer: Self-pay | Admitting: Internal Medicine

## 2015-09-20 ENCOUNTER — Other Ambulatory Visit: Payer: Self-pay | Admitting: Internal Medicine

## 2015-09-20 DIAGNOSIS — E119 Type 2 diabetes mellitus without complications: Secondary | ICD-10-CM

## 2015-09-20 MED FILL — metFORMIN HCL 1000 MG TABS: 1000 | 90 days supply | Qty: 180 | Fill #0

## 2015-09-20 MED FILL — glipiZIDE 5 MG TABS: 5 | 90 days supply | Qty: 90 | Fill #0

## 2015-09-20 MED FILL — LISINOPRIL 5 MG TABLET: 5 | 90 days supply | Qty: 90 | Fill #0

## 2015-09-20 NOTE — Telephone Encounter (Signed)
Patient needs refill for Glipizide,lisinopril, and metformin...Marland KitchenMarland KitchenMarland Kitchenplease follow up

## 2015-09-20 NOTE — Telephone Encounter (Signed)
Patient last seen in office in 2015. Patient has appointment scheduled for 10/11/15. Patients chronic medications have been refilled for 30 day supply to cover patient until appointment. Patient will not receive further refills if appointment is not kept.  Please inform patient of Glipizide, Lisinopril, and Metformin being refilled with a 30 day supply to cover patient until appointment on 10/11/2015. Please inform patient of medications not being refilled in the future if he does not keep his appointment.

## 2015-10-11 ENCOUNTER — Ambulatory Visit: Payer: Self-pay | Attending: Internal Medicine | Admitting: Internal Medicine

## 2015-10-11 ENCOUNTER — Encounter: Payer: Self-pay | Admitting: Internal Medicine

## 2015-10-11 DIAGNOSIS — E119 Type 2 diabetes mellitus without complications: Secondary | ICD-10-CM | POA: Insufficient documentation

## 2015-10-11 DIAGNOSIS — Z23 Encounter for immunization: Secondary | ICD-10-CM | POA: Insufficient documentation

## 2015-10-11 DIAGNOSIS — E785 Hyperlipidemia, unspecified: Secondary | ICD-10-CM | POA: Insufficient documentation

## 2015-10-11 DIAGNOSIS — I10 Essential (primary) hypertension: Secondary | ICD-10-CM | POA: Insufficient documentation

## 2015-10-11 DIAGNOSIS — Z0000 Encounter for general adult medical examination without abnormal findings: Secondary | ICD-10-CM | POA: Insufficient documentation

## 2015-10-11 DIAGNOSIS — Z0001 Encounter for general adult medical examination with abnormal findings: Secondary | ICD-10-CM | POA: Insufficient documentation

## 2015-10-11 DIAGNOSIS — Z888 Allergy status to other drugs, medicaments and biological substances status: Secondary | ICD-10-CM | POA: Insufficient documentation

## 2015-10-11 LAB — COMPLETE METABOLIC PANEL WITH GFR
ALK PHOS: 52 U/L (ref 40–115)
ALT: 23 U/L (ref 9–46)
AST: 17 U/L (ref 10–40)
Albumin: 4.2 g/dL (ref 3.6–5.1)
BUN: 19 mg/dL (ref 7–25)
CALCIUM: 9.4 mg/dL (ref 8.6–10.3)
CHLORIDE: 104 mmol/L (ref 98–110)
CO2: 23 mmol/L (ref 20–31)
CREATININE: 0.95 mg/dL (ref 0.60–1.35)
GFR, Est African American: >89 mL/min (ref >=60)
GFR, Est Non African American: >89 mL/min (ref >=60)
Glucose, Bld: 74 mg/dL (ref 65–99)
POTASSIUM: 4.4 mmol/L (ref 3.5–5.3)
Sodium: 141 mmol/L (ref 135–146)
Total Bilirubin: 0.4 mg/dL (ref 0.2–1.2)
Total Protein: 7.4 g/dL (ref 6.1–8.1)

## 2015-10-11 LAB — LIPID PANEL
CHOLESTEROL: 274 mg/dL — AB (ref 125–200)
HDL: 34 mg/dL — ABNORMAL LOW (ref >=40)
LDL Cholesterol: 206 mg/dL — ABNORMAL HIGH (ref <130)
TRIGLYCERIDES: 168 mg/dL — AB (ref <150)
Total CHOL/HDL Ratio: 8.1 Ratio — ABNORMAL HIGH (ref <=5.0)
VLDL: 34 mg/dL — AB (ref <30)

## 2015-10-11 LAB — POCT GLYCOSYLATED HEMOGLOBIN (HGB A1C): Hemoglobin A1C: 6.6

## 2015-10-11 LAB — GLUCOSE, POCT (MANUAL RESULT ENTRY): POC Glucose: 73 mg/dl (ref 70–99)

## 2015-10-11 MED ORDER — METFORMIN HCL 1000 MG PO TABS: 1000.0000 mg | ORAL_TABLET | Freq: Two times a day (BID) | ORAL | Status: DC

## 2015-10-11 MED ORDER — LISINOPRIL 10 MG PO TABS: 10.0000 mg | ORAL_TABLET | Freq: Every day | ORAL | Status: DC

## 2015-10-11 MED ORDER — GLIPIZIDE 5 MG PO TABS: 5.0000 mg | ORAL_TABLET | Freq: Every day | ORAL | Status: DC

## 2015-10-11 NOTE — Patient Instructions (Signed)
DASH Eating Plan DASH stands for "Dietary Approaches to Stop Hypertension." The DASH eating plan is a healthy eating plan that has been shown to reduce high blood pressure (hypertension). Additional health benefits may include reducing the risk of type 2 diabetes mellitus, heart disease, and stroke. The DASH eating plan may also help with weight loss. WHAT DO I NEED TO KNOW ABOUT THE DASH EATING PLAN? For the DASH eating plan, you will follow these general guidelines:  Choose foods with a percent daily value for sodium of less than 5% (as listed on the food label).  Use salt-free seasonings or herbs instead of table salt or sea salt.  Check with your health care provider or pharmacist before using salt substitutes.  Eat lower-sodium products, often labeled as "lower sodium" or "no salt added."  Eat fresh foods.  Eat more vegetables, fruits, and low-fat dairy products.  Choose whole grains. Look for the word "whole" as the first word in the ingredient list.  Choose fish and skinless chicken or turkey more often than red meat. Limit fish, poultry, and meat to 6 oz (170 g) each day.  Limit sweets, desserts, sugars, and sugary drinks.  Choose heart-healthy fats.  Limit cheese to 1 oz (28 g) per day.  Eat more home-cooked food and less restaurant, buffet, and fast food.  Limit fried foods.  Cook foods using methods other than frying.  Limit canned vegetables. If you do use them, rinse them well to decrease the sodium.  When eating at a restaurant, ask that your food be prepared with less salt, or no salt if possible. WHAT FOODS CAN I EAT? Seek help from a dietitian for individual calorie needs. Grains Whole grain or whole wheat bread. Brown rice. Whole grain or whole wheat pasta. Quinoa, bulgur, and whole grain cereals. Low-sodium cereals. Corn or whole wheat flour tortillas. Whole grain cornbread. Whole grain crackers. Low-sodium crackers. Vegetables Fresh or frozen vegetables  (raw, steamed, roasted, or grilled). Low-sodium or reduced-sodium tomato and vegetable juices. Low-sodium or reduced-sodium tomato sauce and paste. Low-sodium or reduced-sodium canned vegetables.  Fruits All fresh, canned (in natural juice), or frozen fruits. Meat and Other Protein Products Ground beef (85% or leaner), grass-fed beef, or beef trimmed of fat. Skinless chicken or turkey. Ground chicken or turkey. Pork trimmed of fat. All fish and seafood. Eggs. Dried beans, peas, or lentils. Unsalted nuts and seeds. Unsalted canned beans. Dairy Low-fat dairy products, such as skim or 1% milk, 2% or reduced-fat cheeses, low-fat ricotta or cottage cheese, or plain low-fat yogurt. Low-sodium or reduced-sodium cheeses. Fats and Oils Tub margarines without trans fats. Light or reduced-fat mayonnaise and salad dressings (reduced sodium). Avocado. Safflower, olive, or canola oils. Natural peanut or almond butter. Other Unsalted popcorn and pretzels. The items listed above may not be a complete list of recommended foods or beverages. Contact your dietitian for more options. WHAT FOODS ARE NOT RECOMMENDED? Grains White bread. White pasta. White rice. Refined cornbread. Bagels and croissants. Crackers that contain trans fat. Vegetables Creamed or fried vegetables. Vegetables in a cheese sauce. Regular canned vegetables. Regular canned tomato sauce and paste. Regular tomato and vegetable juices. Fruits Dried fruits. Canned fruit in light or heavy syrup. Fruit juice. Meat and Other Protein Products Fatty cuts of meat. Ribs, chicken wings, bacon, sausage, bologna, salami, chitterlings, fatback, hot dogs, bratwurst, and packaged luncheon meats. Salted nuts and seeds. Canned beans with salt. Dairy Whole or 2% milk, cream, half-and-half, and cream cheese. Whole-fat or sweetened yogurt. Full-fat   cheeses or blue cheese. Nondairy creamers and whipped toppings. Processed cheese, cheese spreads, or cheese  curds. Condiments Onion and garlic salt, seasoned salt, table salt, and sea salt. Canned and packaged gravies. Worcestershire sauce. Tartar sauce. Barbecue sauce. Teriyaki sauce. Soy sauce, including reduced sodium. Steak sauce. Fish sauce. Oyster sauce. Cocktail sauce. Horseradish. Ketchup and mustard. Meat flavorings and tenderizers. Bouillon cubes. Hot sauce. Tabasco sauce. Marinades. Taco seasonings. Relishes. Fats and Oils Butter, stick margarine, lard, shortening, ghee, and bacon fat. Coconut, palm kernel, or palm oils. Regular salad dressings. Other Pickles and olives. Salted popcorn and pretzels. The items listed above may not be a complete list of foods and beverages to avoid. Contact your dietitian for more information. WHERE CAN I FIND MORE INFORMATION? [employer], [employer]: www.nhlbi.nih.gov/health/health-topics/topics/dash/   This information is not intended to replace advice given to you by your health care provider. Make sure you discuss any questions you have with your health care provider.   Document Released: 08/07/2011 Document Revised: 09/08/2014 Document Reviewed: 06/22/2013 Elsevier Interactive Patient Education 2016 [employer] Hypertension Hypertension, commonly called high blood pressure, is when the force of blood pumping through your arteries is too strong. Your arteries are the blood vessels that carry blood from your heart throughout your body. A blood pressure reading consists of a higher number over a lower number, such as 110/72. The higher number (systolic) is the pressure inside your arteries when your heart pumps. The lower number (diastolic) is the pressure inside your arteries when your heart relaxes. Ideally you want your blood pressure below 120/80. Hypertension forces your heart to work harder to pump blood. Your arteries may become narrow or stiff. Having untreated or uncontrolled hypertension can cause heart attack, stroke, kidney  disease, and other problems. RISK FACTORS Some risk factors for high blood pressure are controllable. Others are not.  Risk factors you cannot control include:   Race. You may be at higher risk if you are African American.  Age. Risk increases with age.  Gender. Men are at higher risk than women before age 45 years. After age 65, women are at higher risk than men. Risk factors you can control include:  Not getting enough exercise or physical activity.  Being overweight.  Getting too much fat, sugar, calories, or salt in your diet.  Drinking too much alcohol. SIGNS AND SYMPTOMS Hypertension does not usually cause signs or symptoms. Extremely high blood pressure (hypertensive crisis) may cause headache, anxiety, shortness of breath, and nosebleed. DIAGNOSIS To check if you have hypertension, your health care provider will measure your blood pressure while you are seated, with your arm held at the level of your heart. It should be measured at least twice using the same arm. Certain conditions can cause a difference in blood pressure between your right and left arms. A blood pressure reading that is higher than normal on one occasion does not mean that you need treatment. If it is not clear whether you have high blood pressure, you may be asked to return on a different day to have your blood pressure checked again. Or, you may be asked to monitor your blood pressure at home for 1 or more weeks. TREATMENT Treating high blood pressure includes making lifestyle changes and possibly taking medicine. Living a healthy lifestyle can help lower high blood pressure. You may need to change some of your habits. Lifestyle changes may include:  Following the DASH diet. This diet is high in fruits, vegetables, and whole grains.   It is low in salt, red meat, and added sugars.  Keep your sodium intake below 2,300 mg per day.  Getting at least 30-45 minutes of aerobic exercise at least 4 times per  week.  Losing weight if necessary.  Not smoking.  Limiting alcoholic beverages.  Learning ways to reduce stress. Your health care provider may prescribe medicine if lifestyle changes are not enough to get your blood pressure under control, and if one of the following is true:  You are 18-59 years of age and your systolic blood pressure is above 140.  You are 60 years of age or older, and your systolic blood pressure is above 150.  Your diastolic blood pressure is above 90.  You have diabetes, and your systolic blood pressure is over 140 or your diastolic blood pressure is over 90.  You have kidney disease and your blood pressure is above 140/90.  You have heart disease and your blood pressure is above 140/90. Your personal target blood pressure may vary depending on your medical conditions, your age, and other factors. HOME CARE INSTRUCTIONS  Have your blood pressure rechecked as directed by your health care provider.   Take medicines only as directed by your health care provider. Follow the directions carefully. Blood pressure medicines must be taken as prescribed. The medicine does not work as well when you skip doses. Skipping doses also puts you at risk for problems.  Do not smoke.   Monitor your blood pressure at home as directed by your health care provider. SEEK MEDICAL CARE IF:   You think you are having a reaction to medicines taken.  You have recurrent headaches or feel dizzy.  You have swelling in your ankles.  You have trouble with your vision. SEEK IMMEDIATE MEDICAL CARE IF:  You develop a severe headache or confusion.  You have unusual weakness, numbness, or feel faint.  You have severe chest or abdominal pain.  You vomit repeatedly.  You have trouble breathing. MAKE SURE YOU:   Understand these instructions.  Will watch your condition.  Will get help right away if you are not doing well or get worse.   This information is not intended to  replace advice given to you by your health care provider. Make sure you discuss any questions you have with your health care provider.   Document Released: 08/18/2005 Document Revised: 01/02/2015 Document Reviewed: 06/10/2013 Elsevier Interactive Patient Education 2016 [employer] Diabetes and Exercise Exercising regularly is important. It is not just about losing weight. It has many health benefits, such as:  Improving your overall fitness, flexibility, and endurance.  Increasing your bone density.  Helping with weight control.  Decreasing your body fat.  Increasing your muscle strength.  Reducing stress and tension.  Improving your overall health. People with diabetes who exercise gain additional benefits because exercise:  Reduces appetite.  Improves the body's use of blood sugar (glucose).  Helps lower or control blood glucose.  Decreases blood pressure.  Helps control blood lipids (such as cholesterol and triglycerides).  Improves the body's use of the hormone insulin by:  Increasing the body's insulin sensitivity.  Reducing the body's insulin needs.  Decreases the risk for heart disease because exercising:  Lowers cholesterol and triglycerides levels.  Increases the levels of good cholesterol (such as high-density lipoproteins [HDL]) in the body.  Lowers blood glucose levels. YOUR ACTIVITY PLAN  Choose an activity that you enjoy, and set realistic goals. To exercise safely, you should begin practicing any new physical   activity slowly, and gradually increase the intensity of the exercise over time. Your health care provider or diabetes educator can help create an activity plan that works for you. General recommendations include:  Encouraging children to engage in at least 60 minutes of physical activity each day.  Stretching and performing strength training exercises, such as yoga or weight lifting, at least 2 times per week.  Performing a total of at least  150 minutes of moderate-intensity exercise each week, such as brisk walking or water aerobics.  Exercising at least 3 days per week, making sure you allow no more than 2 consecutive days to pass without exercising.  Avoiding long periods of inactivity (90 minutes or more). When you have to spend an extended period of time sitting down, take frequent breaks to walk or stretch. RECOMMENDATIONS FOR EXERCISING WITH TYPE 1 OR TYPE 2 DIABETES   Check your blood glucose before exercising. If blood glucose levels are greater than 240 mg/dL, check for urine ketones. Do not exercise if ketones are present.  Avoid injecting insulin into areas of the body that are going to be exercised. For example, avoid injecting insulin into:  The arms when playing tennis.  The legs when jogging.  Keep a record of:  Food intake before and after you exercise.  Expected peak times of insulin action.  Blood glucose levels before and after you exercise.  The type and amount of exercise you have done.  Review your records with your health care provider. Your health care provider will help you to develop guidelines for adjusting food intake and insulin amounts before and after exercising.  If you take insulin or oral hypoglycemic agents, watch for signs and symptoms of hypoglycemia. They include:  Dizziness.  Shaking.  Sweating.  Chills.  Confusion.  Drink plenty of water while you exercise to prevent dehydration or heat stroke. Body water is lost during exercise and must be replaced.  Talk to your health care provider before starting an exercise program to make sure it is safe for you. Remember, almost any type of activity is better than none.   This information is not intended to replace advice given to you by your health care provider. Make sure you discuss any questions you have with your health care provider.   Document Released: 11/08/2003 Document Revised: 01/02/2015 Document Reviewed:  01/25/2013 Elsevier Interactive Patient Education 2016 [employer] Basic Carbohydrate Counting for Diabetes Mellitus Carbohydrate counting is a method for keeping track of the amount of carbohydrates you eat. Eating carbohydrates naturally increases the level of sugar (glucose) in your blood, so it is important for you to know the amount that is okay for you to have in every meal. Carbohydrate counting helps keep the level of glucose in your blood within normal limits. The amount of carbohydrates allowed is different for every person. A dietitian can help you calculate the amount that is right for you. Once you know the amount of carbohydrates you can have, you can count the carbohydrates in the foods you want to eat. Carbohydrates are found in the following foods:  Grains, such as breads and cereals.  Dried beans and soy products.  Starchy vegetables, such as potatoes, peas, and corn.  Fruit and fruit juices.  Milk and yogurt.  Sweets and snack foods, such as cake, cookies, candy, chips, soft drinks, and fruit drinks. CARBOHYDRATE COUNTING There are two ways to count the carbohydrates in your food. You can use either of the methods or a combination of   both. Reading the "Nutrition Facts" on Packaged Food The "Nutrition Facts" is an area that is included on the labels of almost all packaged food and beverages in the [place]. It includes the serving size of that food or beverage and information about the nutrients in each serving of the food, including the grams (g) of carbohydrate per serving.  Decide the number of servings of this food or beverage that you will be able to eat or drink. Multiply that number of servings by the number of grams of carbohydrate that is listed on the label for that serving. The total will be the amount of carbohydrates you will be having when you eat or drink this food or beverage. Learning Standard Serving Sizes of Food When you eat food that is not  packaged or does not include "Nutrition Facts" on the label, you need to measure the servings in order to count the amount of carbohydrates.A serving of most carbohydrate-rich foods contains about 15 g of carbohydrates. The following list includes serving sizes of carbohydrate-rich foods that provide 15 g ofcarbohydrate per serving:   1 slice of bread (1 oz) or 1 six-inch tortilla.    of a hamburger bun or English muffin.  4-6 crackers.   cup unsweetened dry cereal.    cup hot cereal.   cup rice or pasta.    cup mashed potatoes or  of a large baked potato.  1 cup fresh fruit or one small piece of fruit.    cup canned or frozen fruit or fruit juice.  1 cup milk.   cup plain fat-free yogurt or yogurt sweetened with artificial sweeteners.   cup cooked dried beans or starchy vegetable, such as peas, corn, or potatoes.  Decide the number of standard-size servings that you will eat. Multiply that number of servings by 15 (the grams of carbohydrates in that serving). For example, if you eat 2 cups of strawberries, you will have eaten 2 servings and 30 g of carbohydrates (2 servings x 15 g = 30 g). For foods such as soups and casseroles, in which more than one food is mixed in, you will need to count the carbohydrates in each food that is included. EXAMPLE OF CARBOHYDRATE COUNTING Sample Dinner  3 oz chicken breast.   cup of brown rice.   cup of corn.  1 cup milk.   1 cup strawberries with sugar-free whipped topping.  Carbohydrate Calculation Step 1: Identify the foods that contain carbohydrates:   Rice.   Corn.   Milk.   Strawberries. Step 2:Calculate the number of servings eaten of each:   2 servings of rice.   1 serving of corn.   1 serving of milk.   1 serving of strawberries. Step 3: Multiply each of those number of servings by 15 g:   2 servings of rice x 15 g = 30 g.   1 serving of corn x 15 g = 15 g.   1 serving of milk x 15  g = 15 g.   1 serving of strawberries x 15 g = 15 g. Step 4: Add together all of the amounts to find the total grams of carbohydrates eaten: 30 g + 15 g + 15 g + 15 g = 75 g.   This information is not intended to replace advice given to you by your health care provider. Make sure you discuss any questions you have with your health care provider.   Document Released: 08/18/2005 Document Revised: 09/08/2014 Document   Reviewed: 07/15/2013 Elsevier Interactive Patient Education 2016 [employer]  

## 2015-10-11 NOTE — Progress Notes (Signed)
Patient is here for 6 mth FU  Patient denies any pain at this time.  Patient would like his flu shot today.

## 2015-10-11 NOTE — Progress Notes (Signed)
Johnny Nichols, is a 50 y.o. male  ZOX:[id number]  WJX:[id number]  DOB - 1966/01/04  CC:  Chief Complaint  Patient presents with  . Follow-up    6 month       HPI: Johnny Nichols is a 50 y.o. male here today to for follow up visit and medication refills. Patient has history of Hypertension, Diabetes Mellitus T2, Sleep apnea (CPAP). Patient inquired about reconditioning of CPAP machine. Patient was asked to check with original supplier to see if reconditioning is possible. Patient has elevated blood pressures today but no other complaints. Lisinopril increased from 5 mg to 10 mg. Patient wants influenzae vaccine today and would like to schedule physical for October including colonoscopy. Tdap in 2015. Patient has No headache, No chest pain, No abdominal pain - No Nausea, No new weakness tingling or numbness, No Cough - SOB.  Allergies  Allergen Reactions  . Clindamycin/Lincomycin    Past Medical History  Diagnosis Date  . Sleep apnea   . Diabetes mellitus without complication (HCC)   . Hypertension    No current outpatient prescriptions on file prior to visit.   No current facility-administered medications on file prior to visit.   Family History  Problem Relation Age of Onset  . Heart disease Mother   . Cancer Father   . Hypertension Other   . Obesity Other    Social History   Social History  . Marital Status: Divorced    Spouse Name: N/A  . Number of Children: N/A  . Years of Education: N/A   Occupational History  . Not on file.   Social History Main Topics  . Smoking status: Never Smoker   . Smokeless tobacco: Not on file  . Alcohol Use: No     Comment: no drink x 3 weeks  . Drug Use: Not on file  . Sexual Activity: Not on file   Other Topics Concern  . Not on file   Social History Narrative    Review of Systems: Constitutional: Negative for fever, chills, diaphoresis, activity change, appetite change and fatigue. HENT: Negative for ear  pain, nosebleeds, congestion, facial swelling, rhinorrhea, neck pain, neck stiffness and ear discharge.  Eyes: Negative for pain, discharge, redness, itching and visual disturbance. Respiratory: Negative for cough, choking, chest tightness, shortness of breath, wheezing and stridor.  Cardiovascular: Negative for chest pain, palpitations and leg swelling. Gastrointestinal: Negative for abdominal distention. Genitourinary: Negative for dysuria, urgency, frequency, hematuria, flank pain, decreased urine volume, difficulty urinating and dyspareunia.  Musculoskeletal: Negative for back pain, joint swelling, arthralgia and gait problem. Neurological: Negative for dizziness, tremors, seizures, syncope, facial asymmetry, speech difficulty, weakness, light-headedness, numbness and headaches.  Hematological: Negative for adenopathy. Does not bruise/bleed easily. Psychiatric/Behavioral: Negative for hallucinations, behavioral problems, confusion, dysphoric mood, decreased concentration and agitation.    Objective:   Filed Vitals:   10/11/15 1510 10/11/15 1539  BP: 152/85 142/92  Pulse: 98   Temp: 98.3 F (36.8 C)   Resp: 18     Physical Exam: Constitutional: Patient appears well-developed and well-nourished. No distress. CVS: RRR, S1/S2 +, no murmurs, no gallops, no carotid bruit.  Pulmonary: Effort and breath sounds normal, no stridor, rhonchi, wheezes, rales.  Abdominal: Soft. BS +, no distension, tenderness, rebound or guarding.  Musculoskeletal: Normal range of motion. No edema and no tenderness. No numbness or tingling. Neuro: Alert. Normal reflexes, muscle tone coordination.  Skin: Skin is warm and dry. No rash noted. Not diaphoretic. No erythema. No pallor. Psychiatric: Normal  mood and affect. Behavior, judgment, thought content normal.  Lab Results  Component Value Date   WBC 7.1 11/15/2013   HGB 14.1 11/15/2013   HCT 40.5 11/15/2013   MCV 82.5 11/15/2013   PLT 292 11/15/2013    Lab Results  Component Value Date   CREATININE 0.92 11/15/2013   BUN 17 11/15/2013   NA 137 11/15/2013   K 4.3 11/15/2013   CL 108 11/15/2013   CO2 23 11/15/2013    Lab Results  Component Value Date   HGBA1C 6.60 10/11/2015   Lipid Panel     Component Value Date/Time   CHOL 234* 11/15/2013 1458   TRIG 170* 11/15/2013 1458   HDL 31* 11/15/2013 1458   CHOLHDL 7.5 11/15/2013 1458   VLDL 34 11/15/2013 1458   LDLCALC 169* 11/15/2013 1458       Assessment and plan:   Talor was seen today for follow-up.  Diagnoses and all orders for this visit:  Diabetes mellitus without complication (HCC)  -     POCT A1C -     Glucose (CBG) -     Microalbumin/Creatinine Ratio, Urine  Aim for 30 minutes of exercise most days. Rethink what you drink.  Water is great! Aim for 2-3 Carb Choices per meal (30-45 grams) +/- 1 either way   Aim for 0-15 Carbs per snack if hungry   Include protein in moderation with your meals and snacks   Consider reading food labels for Total Carbohydrate and Fat Grams of foods   Consider checking BG at alternate times per day   Continue taking medication as directed Be mindful about how much sugar you are adding to beverages and other foods.  Fruit Punch - find one with no sugar   Measure and decrease portions of carbohydrate foods   Make your plate and don't go back for seconds  Healthcare maintenance  -     Flu Vaccine QUAD 36+ mos PF IM (Fluarix & Fluzone Quad PF)  Essential hypertension         - Increase lisinopril to 10mg  tablet once per day patient instructed it is ok to continue taking 5mg  tablet x 2 for a 10 mg dose until he need new prescription.   We have discussed target BP range and blood pressure goal. I have advised patient to check BP regularly and to call us back or report to clinic if the numbers are consistently higher than 140/90. We discussed the importance of compliance with medical therapy and DASH diet recommended,  consequences of uncontrolled hypertension discussed.  - continue current BP medications  Dyslipidemia  To address this please limit saturated fat to no more than 7% of your calories, limit cholesterol to 200 mg/day, increase fiber and exercise as tolerated. If needed we may add another cholesterol lowering medication to your regimen.        Return in about 3 months (around 01/08/2016) for Hemoglobin A1C and Follow up, DM, Follow up HTN.  Patient was counseled extensively about the importance of lifestyle modifications including diet and exercise.  The patient was given clear instructions to go to ER or return to medical center if symptoms don't improve, worsen or new problems develop. The patient verbalized understanding. The patient was told to call to get lab results if they haven't heard anything in the next week.     Stephanie Coup, AGNP-Student [facility] [phone] 10/11/2015, 4:09 PM   Evaluation and management procedures were performed by the Advanced Practitioner  under my supervision and collaboration. I have reviewed the Advanced Practitioner's note and chart, and I agree with the management and plan.   Jeanann Lewandowsky, MD, MHA, CPE, FACP, FAAP [facility] [place], [place] [phone]   10/15/2015, 6:48 PM

## 2015-10-12 LAB — URINALYSIS, COMPLETE
BACTERIA UA: NONE SEEN [HPF]
BILIRUBIN URINE: NEGATIVE
Casts: NONE SEEN [LPF]
Crystals: NONE SEEN [HPF]
GLUCOSE, UA: NEGATIVE
HGB URINE DIPSTICK: NEGATIVE
KETONES UR: NEGATIVE
LEUKOCYTES UA: NEGATIVE
Nitrite: NEGATIVE
PROTEIN: NEGATIVE
RBC / HPF: NONE SEEN RBC/HPF (ref <=2)
SQUAMOUS EPITHELIAL / LPF: NONE SEEN [HPF] (ref <=5)
Specific Gravity, Urine: 1.028 (ref 1.001–1.035)
WBC, UA: NONE SEEN WBC/HPF (ref <=5)
YEAST: NONE SEEN [HPF]
pH: 5.5 (ref 5.0–8.0)

## 2015-10-12 LAB — MICROALBUMIN / CREATININE URINE RATIO
CREATININE, URINE: 198 mg/dL (ref 20–370)
MICROALB UR: 1.4 mg/dL
Microalb Creat Ratio: 7 mcg/mg creat (ref <30)

## 2015-10-15 ENCOUNTER — Other Ambulatory Visit: Payer: Self-pay | Admitting: Internal Medicine

## 2015-10-15 MED ORDER — PRAVASTATIN SODIUM 20 MG PO TABS: 20.0000 mg | ORAL_TABLET | Freq: Every day | ORAL | Status: DC

## 2015-10-15 MED FILL — PRAVASTATIN NA 20 MG TAB: 20 | 30 days supply | Qty: 30 | Fill #0

## 2015-10-17 ENCOUNTER — Telehealth: Payer: Self-pay | Admitting: *Deleted

## 2015-10-17 NOTE — Telephone Encounter (Signed)
Patient verified DOB Patient informed of lab results being mostly normal except for cholesterol being very high. Patient made aware of medication (Pravastatin) being ordered at [facility] pharmacy. Patient advised to take medication once daily and to limit saturated fat intake while increasing fiber and exercise. Patient expressed his understanding and had no further questions at this time.

## 2015-10-17 NOTE — Telephone Encounter (Signed)
-----   Message from Quentin Angst, MD sent at 10/15/2015  3:54 PM EST ----- Please inform patient of normal lab results except for cholesterol level that is very high. We will start patient on cholesterol medication and also please limit saturated fat to no more than 7% of your calories, limit cholesterol to 200 mg/day, increase fiber and exercise as tolerated. Medication prescribed to the pharmacy for pickup.

## 2015-10-25 ENCOUNTER — Encounter: Payer: Self-pay | Admitting: Family Medicine

## 2015-10-25 ENCOUNTER — Ambulatory Visit: Payer: Self-pay | Attending: Family Medicine | Admitting: Family Medicine

## 2015-10-25 VITALS — BP 178/113 | HR 71 | Temp 97.8°F | Resp 18 | Ht 69.0 in | Wt 231.0 lb

## 2015-10-25 DIAGNOSIS — E785 Hyperlipidemia, unspecified: Secondary | ICD-10-CM

## 2015-10-25 DIAGNOSIS — R319 Hematuria, unspecified: Secondary | ICD-10-CM | POA: Insufficient documentation

## 2015-10-25 DIAGNOSIS — R109 Unspecified abdominal pain: Secondary | ICD-10-CM | POA: Insufficient documentation

## 2015-10-25 DIAGNOSIS — Z79899 Other long term (current) drug therapy: Secondary | ICD-10-CM | POA: Insufficient documentation

## 2015-10-25 DIAGNOSIS — N23 Unspecified renal colic: Secondary | ICD-10-CM

## 2015-10-25 DIAGNOSIS — N200 Calculus of kidney: Secondary | ICD-10-CM | POA: Insufficient documentation

## 2015-10-25 DIAGNOSIS — Z87442 Personal history of urinary calculi: Secondary | ICD-10-CM | POA: Insufficient documentation

## 2015-10-25 DIAGNOSIS — E119 Type 2 diabetes mellitus without complications: Secondary | ICD-10-CM

## 2015-10-25 DIAGNOSIS — R10A0 Flank pain, unspecified side: Secondary | ICD-10-CM

## 2015-10-25 DIAGNOSIS — Z7984 Long term (current) use of oral hypoglycemic drugs: Secondary | ICD-10-CM | POA: Insufficient documentation

## 2015-10-25 DIAGNOSIS — I10 Essential (primary) hypertension: Secondary | ICD-10-CM

## 2015-10-25 LAB — POCT URINALYSIS DIPSTICK
Bilirubin, UA: NEGATIVE
GLUCOSE UA: NEGATIVE
KETONES UA: 40
Leukocytes, UA: NEGATIVE
Nitrite, UA: NEGATIVE
Protein, UA: NEGATIVE
SPEC GRAV UA: 1.02
Urobilinogen, UA: 0.2
pH, UA: 5

## 2015-10-25 LAB — GLUCOSE, POCT (MANUAL RESULT ENTRY): POC Glucose: 227 mg/dl — AB (ref 70–99)

## 2015-10-25 MED ORDER — ACETAMINOPHEN-CODEINE #3 300-30 MG PO TABS: 1.0000 | ORAL_TABLET | Freq: Four times a day (QID) | ORAL | Status: DC | PRN

## 2015-10-25 MED ORDER — ONDANSETRON HCL 4 MG PO TABS: 4.0000 mg | ORAL_TABLET | Freq: Three times a day (TID) | ORAL | Status: DC | PRN

## 2015-10-25 MED FILL — ?ONDANSETRON HCL 4 MG TABLE: 4 | 7 days supply | Qty: 20 | Fill #0

## 2015-10-25 MED FILL — ACETAMINOPHEN/COD #3 TABLET: 300-30 | 8 days supply | Qty: 30 | Fill #0

## 2015-10-25 NOTE — Progress Notes (Signed)
Subjective:  Patient ID: Johnny Nichols, male    DOB: Jan 27, 1966  Age: 50 y.o. MRN: [id number]  CC: Flank Pain   HPI Johnny Nichols is a 50 year old male with a history of type 2 diabetes mellitus (A1c 6.8), hypertension, hyperlipidemia who comes into the clinic complaining of left flank pain which started 3 days ago radiating to the left lower quadrant of the abdomen and has progressively worsened and pain is typical of renal colic which he has had several years ago for kidney stones. He denies hematuria or dysuria and has no fever but does endorse some nausea which he felt this morning; he had to take anything for pain.  His blood pressure is elevated today and he admits not taking his antihypertensives yet. Currently on metformin for diabetes mellitus and takes pravastatin for hyperlipidemia. Reviewed most recent labs with the patient which reveals elevated lipids.  Outpatient Prescriptions Prior to Visit  Medication Sig Dispense Refill  . glipiZIDE (GLUCOTROL) 5 MG tablet Take 1 tablet (5 mg total) by mouth daily before breakfast. 90 tablet 3  . lisinopril (PRINIVIL,ZESTRIL) 10 MG tablet Take 1 tablet (10 mg total) by mouth daily. 90 tablet 3  . metFORMIN (GLUCOPHAGE) 1000 MG tablet Take 1 tablet (1,000 mg total) by mouth 2 (two) times daily with a meal. 180 tablet 3  . pravastatin (PRAVACHOL) 20 MG tablet Take 1 tablet (20 mg total) by mouth daily. (Patient not taking: Reported on 10/25/2015) 90 tablet 3   No facility-administered medications prior to visit.    ROS Review of Systems  Constitutional: Negative for fever, activity change and appetite change.  HENT: Negative for sinus pressure and sore throat.   Eyes: Negative for visual disturbance.  Respiratory: Negative for cough, chest tightness and shortness of breath.   Cardiovascular: Negative for chest pain and leg swelling.  Gastrointestinal: Positive for nausea. Negative for abdominal pain, diarrhea, constipation and  abdominal distention.  Endocrine: Negative.   Genitourinary: Positive for flank pain. Negative for dysuria and hematuria.  Musculoskeletal: Negative for myalgias and joint swelling.  Skin: Negative for rash.  Allergic/Immunologic: Negative.   Neurological: Negative for weakness, light-headedness and numbness.  Psychiatric/Behavioral: Negative for suicidal ideas and dysphoric mood.    Objective:  BP 178/113 mmHg  Pulse 71  Temp(Src) 97.8 F (36.6 C)  Resp 18  Ht  (1.753 m)  Wt 231 lb (104.781 kg)  BMI 34.10 kg/m2  SpO2 100%  BP/Weight 10/25/2015 10/11/2015 08/14/2014  Systolic BP 178 142 132  Diastolic BP 113 92 92  Wt. (Lbs) 231 233 223  BMI 34.1 34.39 32.92      Physical Exam  Constitutional: He is oriented to person, place, and time. He appears well-developed and well-nourished.  In pain  Cardiovascular: Normal rate, normal heart sounds and intact distal pulses.   No murmur heard. Pulmonary/Chest: Effort normal and breath sounds normal. He has no wheezes. He has no rales. He exhibits no tenderness.  Abdominal: Soft. Bowel sounds are normal. He exhibits no distension and no mass. There is no tenderness.  Musculoskeletal: He exhibits tenderness (left CVA tenderness).  Neurological: He is alert and oriented to person, place, and time.  Skin: Skin is warm and dry.  Psychiatric: He has a normal mood and affect.   CMP Latest Ref Rng 10/11/2015 11/15/2013 04/04/2013  Glucose 65 - 99 mg/dL 74 409(W) 89  BUN 7 - 25 mg/dL Creatinine 0.60 - 1.35 mg/dL 1.19 1.47 8.29  Sodium 135 -  146 mmol/L 141 137 139  Potassium 3.5 - 5.3 mmol/L 4.4 4.3 4.6  Chloride 98 - 110 mmol/L 104 108 105  CO2 20 - 31 mmol/L Calcium 8.6 - 10.3 mg/dL 9.4 9.4 9.6  Total Protein 6.1 - 8.1 g/dL 7.4 6.8 7.2  Total Bilirubin 0.2 - 1.2 mg/dL 0.4 0.3 0.3  Alkaline Phos 40 - 115 U/L 52 44 50  AST 10 - 40 U/L ALT 9 - 46 U/L Lipid Panel     Component Value  Date/Time   CHOL 274* 10/11/2015 1541   TRIG 168* 10/11/2015 1541   HDL 34* 10/11/2015 1541   CHOLHDL 8.1* 10/11/2015 1541   VLDL 34* 10/11/2015 1541   LDLCALC 206* 10/11/2015 1541      Assessment & Plan:   1. Type 2 diabetes mellitus without complication, without long-term current use of insulin (HCC) Controlled with A1c of 6.8 - Glucose (CBG)  2. Flank pain UA positive for hematuria which is in keeping with renal calculi - Urinalysis Dipstick  3. Renal colic I would love to order CT renal stone protocol however the the patient has no medical coverage and no [employer] discount and is concerned about the bill Will return at his next office visit for follow-up on symptoms. - ondansetron (ZOFRAN) 4 MG tablet; Take 1 tablet (4 mg total) by mouth every 8 (eight) hours as needed for nausea or vomiting.  Dispense: 20 tablet; Refill: 0 - acetaminophen-codeine (TYLENOL #3) 300-30 MG tablet; Take 1 tablet by mouth every 6 (six) hours as needed for moderate pain.  Dispense: 30 tablet; Refill: 0  4. Essential hypertension Uncontrolled due to missing dose of antihypertensive. Patient has his medication with him which he was advised to take in the office. Advised on low sodium, DASH diet  5. Hyperlipidemia Uncontrolled with LDL of 206 and target is less than 100 Compliant to pravastatin emphasized. He may need an increase in dose of statin if elevation persists. Low cholesterol diet and lifestyle modification.   Meds ordered this encounter  Medications  . ondansetron (ZOFRAN) 4 MG tablet    Sig: Take 1 tablet (4 mg total) by mouth every 8 (eight) hours as needed for nausea or vomiting.    Dispense:  20 tablet    Refill:  0  . acetaminophen-codeine (TYLENOL #3) 300-30 MG tablet    Sig: Take 1 tablet by mouth every 6 (six) hours as needed for moderate pain.    Dispense:  30 tablet    Refill:  0    Follow-up: Return in about 3 weeks (around 11/15/2015) for follow up on renal colic  with PCP- Dr Hyman Hopes.   Jaclyn Shaggy MD

## 2015-10-25 NOTE — Progress Notes (Signed)
Dull left sided pain started Monday-it is severe now He has not taken his medications this am

## 2015-11-19 MED FILL — LISINOPRIL 10 MG TABLET: 10 | 90 days supply | Qty: 90 | Fill #0

## 2016-01-08 MED FILL — glipiZIDE 5 MG TABS: 5 | 60 days supply | Qty: 60 | Fill #0

## 2016-01-08 MED FILL — metFORMIN HCL 1000 MG TABS: 1000 | 60 days supply | Qty: 120 | Fill #0

## 2016-02-22 MED FILL — LISINOPRIL 10 MG TABLET: 10 | 90 days supply | Qty: 90 | Fill #1

## 2016-02-22 MED FILL — PRAVASTATIN NA 20 MG TAB: 20 | 30 days supply | Qty: 30 | Fill #1

## 2016-03-14 MED FILL — metFORMIN HCL 1000 MG TABS: 1000 | 90 days supply | Qty: 180 | Fill #1

## 2016-03-14 MED FILL — glipiZIDE 5 MG TABS: 5 | 90 days supply | Qty: 90 | Fill #1

## 2016-06-19 MED FILL — PRAVASTATIN NA 20 MG TAB: 20 | 30 days supply | Qty: 30 | Fill #2

## 2016-06-19 MED FILL — glipiZIDE 5 MG TABS: 5 | 90 days supply | Qty: 90 | Fill #2

## 2016-06-19 MED FILL — metFORMIN HCL 1000 MG TABS: 1000 | 90 days supply | Qty: 180 | Fill #2

## 2016-06-19 MED FILL — LISINOPRIL 10 MG TABLET: 10 | 90 days supply | Qty: 90 | Fill #2

## 2016-09-30 MED FILL — LISINOPRIL 10 MG TABLET: 10 | 90 days supply | Qty: 90 | Fill #3

## 2016-09-30 MED FILL — glipiZIDE 5 MG TABS: 5 | 90 days supply | Qty: 90 | Fill #3

## 2016-09-30 MED FILL — PRAVASTATIN NA 20 MG TAB: 20 | 90 days supply | Qty: 90 | Fill #3

## 2016-09-30 MED FILL — metFORMIN HCL 1000 MG TABS: 1000 | 90 days supply | Qty: 180 | Fill #3

## 2016-12-30 ENCOUNTER — Other Ambulatory Visit: Payer: Self-pay | Admitting: Internal Medicine

## 2016-12-30 DIAGNOSIS — I10 Essential (primary) hypertension: Secondary | ICD-10-CM

## 2016-12-30 DIAGNOSIS — E119 Type 2 diabetes mellitus without complications: Secondary | ICD-10-CM

## 2016-12-31 MED FILL — PRAVASTATIN NA 20 MG TAB: 20 | 90 days supply | Qty: 90 | Fill #0

## 2016-12-31 MED FILL — glipiZIDE 5 MG TABS: 5 | 90 days supply | Qty: 90 | Fill #0

## 2016-12-31 MED FILL — LISINOPRIL 10 MG TABLET: 10 | 90 days supply | Qty: 90 | Fill #0

## 2016-12-31 MED FILL — metFORMIN HCL 1000 MG TABS: 1000 | 90 days supply | Qty: 180 | Fill #0

## 2017-04-08 MED FILL — PRAVASTATIN NA 20 MG TAB: 20 | 90 days supply | Qty: 90 | Fill #1

## 2017-04-08 MED FILL — LISINOPRIL 10 MG TABLET: 10 | 90 days supply | Qty: 90 | Fill #1

## 2017-04-08 MED FILL — glipiZIDE 5 MG TABS: 5 | 90 days supply | Qty: 90 | Fill #1

## 2017-04-08 MED FILL — metFORMIN HCL 1000 MG TABS: 1000 | 90 days supply | Qty: 180 | Fill #1

## 2017-05-06 ENCOUNTER — Ambulatory Visit: Payer: Self-pay | Attending: Internal Medicine | Admitting: Internal Medicine

## 2017-05-06 VITALS — BP 146/95 | HR 91 | Temp 98.5°F | Resp 16 | Wt 236.4 lb

## 2017-05-06 DIAGNOSIS — Z79899 Other long term (current) drug therapy: Secondary | ICD-10-CM | POA: Insufficient documentation

## 2017-05-06 DIAGNOSIS — E785 Hyperlipidemia, unspecified: Secondary | ICD-10-CM | POA: Insufficient documentation

## 2017-05-06 DIAGNOSIS — E119 Type 2 diabetes mellitus without complications: Secondary | ICD-10-CM | POA: Insufficient documentation

## 2017-05-06 DIAGNOSIS — G4730 Sleep apnea, unspecified: Secondary | ICD-10-CM | POA: Insufficient documentation

## 2017-05-06 DIAGNOSIS — I10 Essential (primary) hypertension: Secondary | ICD-10-CM | POA: Insufficient documentation

## 2017-05-06 DIAGNOSIS — Z7984 Long term (current) use of oral hypoglycemic drugs: Secondary | ICD-10-CM | POA: Insufficient documentation

## 2017-05-06 DIAGNOSIS — Z1211 Encounter for screening for malignant neoplasm of colon: Secondary | ICD-10-CM

## 2017-05-06 LAB — POCT GLYCOSYLATED HEMOGLOBIN (HGB A1C): Hemoglobin A1C: 8

## 2017-05-06 LAB — GLUCOSE, POCT (MANUAL RESULT ENTRY): POC Glucose: 171 mg/dl — AB (ref 70–99)

## 2017-05-06 NOTE — Progress Notes (Signed)
Johnny Nichols, is a 51 y.o. male  ZOX:[id number]  WJX:[id number]  DOB - 29-Nov-1965  Chief Complaint  Patient presents with  . Medication Refill  . Diabetes  . Hypertension      Subjective:   Johnny Nichols is a 51 y.o. male with history of Hypertension, Diabetes Mellitus T2, Sleep apnea on CPAP presents here today for a follow up visit and health maintenance. Patient has no new complaints today. He is adherence to his medications and lifestyle modifications. He needs a referral to ophthalmologist and podiatrist for routine diabetic eye and foot examination. He does not need refill of his medications today. Blood pressure is slightly elevated today but patient has not taken his medication. Patient has No headache, No chest pain, No abdominal pain - No Nausea, No new weakness tingling or numbness, No Cough - SOB. Patient is due for colonoscopy.  ALLERGIES: Allergies  Allergen Reactions  . Clindamycin/Lincomycin    PAST MEDICAL HISTORY: Past Medical History:  Diagnosis Date  . Diabetes mellitus without complication (HCC)   . Hypertension   . Sleep apnea     MEDICATIONS AT HOME: Prior to Admission medications   Medication Sig Start Date End Date Taking? Authorizing Provider  acetaminophen-codeine (TYLENOL #3) 300-30 MG tablet Take 1 tablet by mouth every 6 (six) hours as needed for moderate pain. 10/25/15   Jaclyn Shaggy, MD  glipiZIDE (GLUCOTROL) 5 MG tablet TAKE 1 TABLET BY MOUTH DAILY BEFORE BREAKFAST. 12/30/16   Meadow Abramo E, MD  lisinopril (PRINIVIL,ZESTRIL) 10 MG tablet TAKE 1 TABLET BY MOUTH DAILY. 12/30/16   Quentin Angst, MD  metFORMIN (GLUCOPHAGE) 1000 MG tablet TAKE 1 TABLET BY MOUTH 2 TIMES DAILY WITH A MEAL. 12/30/16   Ronaldo Crilly E, MD  pravastatin (PRAVACHOL) 20 MG tablet TAKE 1 TABLET BY MOUTH DAILY. 12/30/16   Quentin Angst, MD    Objective:   Vitals:   05/06/17 1538  BP: (!) 146/95  Pulse: 91  Resp: 16  Temp: 98.5 F (36.9 C)   TempSrc: Oral  SpO2: 97%  Weight: 236 lb 6.4 oz (107.2 kg)   Exam General appearance : Awake, alert, not in any distress. Speech Clear. Not toxic looking HEENT: Atraumatic and Normocephalic, pupils equally reactive to light and accomodation Neck: Supple, no JVD. No cervical lymphadenopathy.  Chest: Good air entry bilaterally, no added sounds  CVS: S1 S2 regular, no murmurs.  Abdomen: Bowel sounds present, Non tender and not distended with no gaurding, rigidity or rebound. Extremities: B/L Lower Ext shows no edema, both legs are warm to touch Neurology: Awake alert, and oriented X 3, CN II-XII intact, Non focal Skin: No Rash  Data Review Lab Results  Component Value Date   HGBA1C 8.0 05/06/2017   HGBA1C 6.60 10/11/2015   HGBA1C 6.2 08/14/2014   Assessment & Plan   1. Diabetes mellitus without complication (HCC)  - POCT glucose (manual entry) - POCT glycosylated hemoglobin (Hb A1C) - Ambulatory referral to Ophthalmology - Ambulatory referral to Podiatry  2. Essential hypertension  We have discussed target BP range and blood pressure goal. I have advised patient to check BP regularly and to call us back or report to clinic if the numbers are consistently higher than 140/90. We discussed the importance of compliance with medical therapy and DASH diet recommended, consequences of uncontrolled hypertension discussed.  - continue current BP medications  3. Dyslipidemia  To address this please limit saturated fat to no more than 7% of your calories, limit cholesterol  to 200 mg/day, increase fiber and exercise as tolerated. If needed we may add another cholesterol lowering medication to your regimen.   4. Colon cancer screening  - Ambulatory referral to Gastroenterology  Patient have been counseled extensively about nutrition and exercise. Other issues discussed during this visit include: low cholesterol diet, weight control and daily exercise, foot care, annual eye examinations  at Ophthalmology, importance of adherence with medications and regular follow-up. We also discussed long term complications of uncontrolled diabetes and hypertension.   Return in about 3 months (around 08/05/2017) for Hemoglobin A1C and Follow up, DM, Follow up HTN.  The patient was given clear instructions to go to ER or return to medical center if symptoms don't improve, worsen or new problems develop. The patient verbalized understanding. The patient was told to call to get lab results if they haven't heard anything in the next week.   This note has been created with Education officer, environmentalDragon speech recognition software and smart phrase technology. Any transcriptional errors are unintentional.    Jeanann LewandowskyJEGEDE, Jacory Kamel, MD, MHA, FACP, FAAP, CPE [facility] [place], [place] [phone]   05/06/2017, 4:31 PM

## 2017-05-06 NOTE — Patient Instructions (Signed)
Diabetes Mellitus and Food It is important for you to manage your blood sugar (glucose) level. Your blood glucose level can be greatly affected by what you eat. Eating healthier foods in the appropriate amounts throughout the day at about the same time each day will help you control your blood glucose level. It can also help slow or prevent worsening of your diabetes mellitus. Healthy eating may even help you improve the level of your blood pressure and reach or maintain a healthy weight. General recommendations for healthful eating and cooking habits include:  Eating meals and snacks regularly. Avoid going long periods of time without eating to lose weight.  Eating a diet that consists mainly of plant-based foods, such as fruits, vegetables, nuts, legumes, and whole grains.  Using low-heat cooking methods, such as baking, instead of high-heat cooking methods, such as deep frying.  Work with your dietitian to make sure you understand how to use the Nutrition Facts information on food labels. How can food affect me? Carbohydrates Carbohydrates affect your blood glucose level more than any other type of food. Your dietitian will help you determine how many carbohydrates to eat at each meal and teach you how to count carbohydrates. Counting carbohydrates is important to keep your blood glucose at a healthy level, especially if you are using insulin or taking certain medicines for diabetes mellitus. Alcohol Alcohol can cause sudden decreases in blood glucose (hypoglycemia), especially if you use insulin or take certain medicines for diabetes mellitus. Hypoglycemia can be a life-threatening condition. Symptoms of hypoglycemia (sleepiness, dizziness, and disorientation) are similar to symptoms of having too much alcohol. If your health care provider has given you approval to drink alcohol, do so in moderation and use the following guidelines:  Women should not have more than one drink per day, and men  should not have more than two drinks per day. One drink is equal to: ? 12 oz of beer. ? 5 oz of wine. ? 1 oz of hard liquor.  Do not drink on an empty stomach.  Keep yourself hydrated. Have water, diet soda, or unsweetened iced tea.  Regular soda, juice, and other mixers might contain a lot of carbohydrates and should be counted.  What foods are not recommended? As you make food choices, it is important to remember that all foods are not the same. Some foods have fewer nutrients per serving than other foods, even though they might have the same number of calories or carbohydrates. It is difficult to get your body what it needs when you eat foods with fewer nutrients. Examples of foods that you should avoid that are high in calories and carbohydrates but low in nutrients include:  Trans fats (most processed foods list trans fats on the Nutrition Facts label).  Regular soda.  Juice.  Candy.  Sweets, such as cake, pie, doughnuts, and cookies.  Fried foods.  What foods can I eat? Eat nutrient-rich foods, which will nourish your body and keep you healthy. The food you should eat also will depend on several factors, including:  The calories you need.  The medicines you take.  Your weight.  Your blood glucose level.  Your blood pressure level.  Your cholesterol level.  You should eat a variety of foods, including:  Protein. ? Lean cuts of meat. ? Proteins low in saturated fats, such as fish, egg whites, and beans. Avoid processed meats.  Fruits and vegetables. ? Fruits and vegetables that may help control blood glucose levels, such as apples,   mangoes, and yams.  Dairy products. ? Choose fat-free or low-fat dairy products, such as milk, yogurt, and cheese.  Grains, bread, pasta, and rice. ? Choose whole grain products, such as multigrain bread, whole oats, and brown rice. These foods may help control blood pressure.  Fats. ? Foods containing healthful fats, such as  nuts, avocado, olive oil, canola oil, and fish.  Does everyone with diabetes mellitus have the same meal plan? Because every person with diabetes mellitus is different, there is not one meal plan that works for everyone. It is very important that you meet with a dietitian who will help you create a meal plan that is just right for you. This information is not intended to replace advice given to you by your health care provider. Make sure you discuss any questions you have with your health care provider. Document Released: 05/15/2005 Document Revised: 01/24/2016 Document Reviewed: 07/15/2013 Elsevier Interactive Patient Education  2017 Else[employer] Inc. Diabetes Mellitus and Exercise Exercising regularly is important for your overall health, especially when you have diabetes (diabetes mellitus). Exercising is not only about losing weight. It has many health benefits, such as increasing muscle strength and bone density and reducing body fat and stress. This leads to improved fitness, flexibility, and endurance, all of which result in better overall health. Exercise has additional benefits for people with diabetes, including:  Reducing appetite.  Helping to lower and control blood glucose.  Lowering blood pressure.  Helping to control amounts of fatty substances (lipids) in the blood, such as cholesterol and triglycerides.  Helping the body to respond better to insulin (improving insulin sensitivity).  Reducing how much insulin the body needs.  Decreasing the risk for heart disease by: ? Lowering cholesterol and triglyceride levels. ? Increasing the levels of good cholesterol. ? Lowering blood glucose levels.  What is my activity plan? Your health care provider or certified diabetes educator can help you make a plan for the type and frequency of exercise (activity plan) that works for you. Make sure that you:  Do at least 150 minutes of moderate-intensity or vigorous-intensity exercise each  week. This could be brisk walking, biking, or water aerobics. ? Do stretching and strength exercises, such as yoga or weightlifting, at least 2 times a week. ? Spread out your activity over at least 3 days of the week.  Get some form of physical activity every day. ? Do not go more than 2 days in a row without some kind of physical activity. ? Avoid being inactive for more than 90 minutes at a time. Take frequent breaks to walk or stretch.  Choose a type of exercise or activity that you enjoy, and set realistic goals.  Start slowly, and gradually increase the intensity of your exercise over time.  What do I need to know about managing my diabetes?  Check your blood glucose before and after exercising. ? If your blood glucose is higher than 240 mg/dL (13.3 mmol/L) before you exercise, check your urine for ketones. If you have ketones in your urine, do not exercise until your blood glucose returns to normal.  Know the symptoms of low blood glucose (hypoglycemia) and how to treat it. Your risk for hypoglycemia increases during and after exercise. Common symptoms of hypoglycemia can include: ? Hunger. ? Anxiety. ? Sweating and feeling clammy. ? Confusion. ? Dizziness or feeling light-headed. ? Increased heart rate or palpitations. ? Blurry vision. ? Tingling or numbness around the mouth, lips, or tongue. ? Tremors or shakes. ?   Irritability.  Keep a rapid-acting carbohydrate snack available before, during, and after exercise to help prevent or treat hypoglycemia.  Avoid injecting insulin into areas of the body that are going to be exercised. For example, avoid injecting insulin into: ? The arms, when playing tennis. ? The legs, when jogging.  Keep records of your exercise habits. Doing this can help you and your health care provider adjust your diabetes management plan as needed. Write down: ? Food that you eat before and after you exercise. ? Blood glucose levels before and after you  exercise. ? The type and amount of exercise you have done. ? When your insulin is expected to peak, if you use insulin. Avoid exercising at times when your insulin is peaking.  When you start a new exercise or activity, work with your health care provider to make sure the activity is safe for you, and to adjust your insulin, medicines, or food intake as needed.  Drink plenty of water while you exercise to prevent dehydration or heat stroke. Drink enough fluid to keep your urine clear or pale yellow. This information is not intended to replace advice given to you by your health care provider. Make sure you discuss any questions you have with your health care provider. Document Released: 11/08/2003 Document Revised: 03/07/2016 Document Reviewed: 01/28/2016 Elsevier Interactive Patient Education  2018 [employer] Inc. Blood Glucose Monitoring, Adult Monitoring your blood sugar (glucose) helps you manage your diabetes. It also helps you and your health care provider determine how well your diabetes management plan is working. Blood glucose monitoring involves checking your blood glucose as often as directed, and keeping a record (log) of your results over time. Why should I monitor my blood glucose? Checking your blood glucose regularly can:  Help you understand how food, exercise, illnesses, and medicines affect your blood glucose.  Let you know what your blood glucose is at any time. You can quickly tell if you are having low blood glucose (hypoglycemia) or high blood glucose (hyperglycemia).  Help you and your health care provider adjust your medicines as needed.  When should I check my blood glucose? Follow instructions from your health care provider about how often to check your blood glucose. This may depend on:  The type of diabetes you have.  How well-controlled your diabetes is.  Medicines you are taking.  If you have type 1 diabetes:  Check your blood glucose at least 2 times a  day.  Also check your blood glucose: ? Before every insulin injection. ? Before and after exercise. ? Between meals. ? 2 hours after a meal. ? Occasionally between 2:00 a.m. and 3:00 a.m., as directed. ? Before potentially dangerous tasks, like driving or using heavy machinery. ? At bedtime.  You may need to check your blood glucose more often, up to 6-10 times a day: ? If you use an insulin pump. ? If you need multiple daily injections (MDI). ? If your diabetes is not well-controlled. ? If you are ill. ? If you have a history of severe hypoglycemia. ? If you have a history of not knowing when your blood glucose is getting low (hypoglycemia unawareness). If you have type 2 diabetes:  If you take insulin or other diabetes medicines, check your blood glucose at least 2 times a day.  If you are on intensive insulin therapy, check your blood glucose at least 4 times a day. Occasionally, you may also need to check between 2:00 a.m. and 3:00 a.m., as directed.    Also check your blood glucose: ? Before and after exercise. ? Before potentially dangerous tasks, like driving or using heavy machinery.  You may need to check your blood glucose more often if: ? Your medicine is being adjusted. ? Your diabetes is not well-controlled. ? You are ill. What is a blood glucose log?  A blood glucose log is a record of your blood glucose readings. It helps you and your health care provider: ? Look for patterns in your blood glucose over time. ? Adjust your diabetes management plan as needed.  Every time you check your blood glucose, write down your result and notes about things that may be affecting your blood glucose, such as your diet and exercise for the day.  Most glucose meters store a record of glucose readings in the meter. Some meters allow you to download your records to a computer. How do I check my blood glucose? Follow these steps to get accurate readings of your blood  glucose: Supplies needed   Blood glucose meter.  Test strips for your meter. Each meter has its own strips. You must use the strips that come with your meter.  A needle to prick your finger (lancet). Do not use lancets more than once.  A device that holds the lancet (lancing device).  A journal or log book to write down your results. Procedure  Wash your hands with soap and water.  Prick the side of your finger (not the tip) with the lancet. Use a different finger each time.  Gently rub the finger until a small drop of blood appears.  Follow instructions that come with your meter for inserting the test strip, applying blood to the strip, and using your blood glucose meter.  Write down your result and any notes. Alternative testing sites  Some meters allow you to use areas of your body other than your finger (alternative sites) to test your blood.  If you think you may have hypoglycemia, or if you have hypoglycemia unawareness, do not use alternative sites. Use your finger instead.  Alternative sites may not be as accurate as the fingers, because blood flow is slower in these areas. This means that the result you get may be delayed, and it may be different from the result that you would get from your finger.  The most common alternative sites are: ? Forearm. ? Thigh. ? Palm of the hand. Additional tips  Always keep your supplies with you.  If you have questions or need help, all blood glucose meters have a 24-hour "hotline" number that you can call. You may also contact your health care provider.  After you use a few boxes of test strips, adjust (calibrate) your blood glucose meter by following instructions that came with your meter. This information is not intended to replace advice given to you by your health care provider. Make sure you discuss any questions you have with your health care provider. Document Released: 08/21/2003 Document Revised: 03/07/2016 Document  Reviewed: 01/28/2016 Elsevier Interactive Patient Education  2017 Elsevier Inc.  

## 2017-07-06 MED FILL — metFORMIN HCL 1000 MG TABS: 1000 | 90 days supply | Qty: 180 | Fill #2

## 2017-07-06 MED FILL — PRAVASTATIN NA 20 MG TAB: 20 | 90 days supply | Qty: 90 | Fill #2

## 2017-07-06 MED FILL — glipiZIDE 5 MG TABS: 5 | 90 days supply | Qty: 90 | Fill #2

## 2017-07-06 MED FILL — LISINOPRIL 10 MG TABS: 10 | 90 days supply | Qty: 90 | Fill #2

## 2017-07-14 ENCOUNTER — Encounter: Payer: Self-pay | Admitting: Internal Medicine

## 2017-08-12 ENCOUNTER — Ambulatory Visit: Payer: Self-pay | Admitting: Internal Medicine

## 2017-08-19 ENCOUNTER — Ambulatory Visit: Payer: Self-pay | Admitting: Internal Medicine

## 2017-10-07 MED FILL — LISINOPRIL 10 MG TABS: 10 | 90 days supply | Qty: 90 | Fill #3

## 2017-10-07 MED FILL — glipiZIDE 5 MG TABS: 5 | 90 days supply | Qty: 90 | Fill #3

## 2017-10-07 MED FILL — PRAVASTATIN NA 20 MG TAB: 20 | 90 days supply | Qty: 90 | Fill #3

## 2017-10-07 MED FILL — metFORMIN HCL 1000 MG TABS: 1000 | 90 days supply | Qty: 180 | Fill #3

## 2018-01-07 ENCOUNTER — Telehealth: Payer: Self-pay | Admitting: Internal Medicine

## 2018-01-07 NOTE — Telephone Encounter (Signed)
Patient called requesting a refill on all his current medications. For one month until he can be seen in the [facility] with new pcp.

## 2018-01-08 ENCOUNTER — Other Ambulatory Visit: Payer: Self-pay | Admitting: Family Medicine

## 2018-01-08 DIAGNOSIS — E119 Type 2 diabetes mellitus without complications: Secondary | ICD-10-CM

## 2018-01-08 DIAGNOSIS — I10 Essential (primary) hypertension: Secondary | ICD-10-CM

## 2018-01-08 MED ORDER — PRAVASTATIN SODIUM 20 MG PO TABS: 20.0000 mg | ORAL_TABLET | Freq: Every day | ORAL | 0 refills | Status: DC

## 2018-01-08 MED ORDER — GLIPIZIDE 5 MG PO TABS: ORAL_TABLET | ORAL | 0 refills | Status: DC

## 2018-01-08 MED ORDER — LISINOPRIL 10 MG PO TABS: 10.0000 mg | ORAL_TABLET | Freq: Every day | ORAL | 0 refills | Status: DC

## 2018-01-08 MED ORDER — METFORMIN HCL 1000 MG PO TABS: ORAL_TABLET | ORAL | 0 refills | Status: DC

## 2018-01-08 MED FILL — LISINOPRIL 10 MG TABS: 10 | 30 days supply | Qty: 30 | Fill #0

## 2018-01-08 MED FILL — PRAVASTATIN NA 20 MG TAB: 20 | 30 days supply | Qty: 30 | Fill #0

## 2018-01-08 MED FILL — metFORMIN HCL 1000 MG TABS: 1000 | 30 days supply | Qty: 60 | Fill #0

## 2018-01-08 MED FILL — glipiZIDE 5 MG TABS: 5 | 30 days supply | Qty: 30 | Fill #0

## 2018-01-08 NOTE — Telephone Encounter (Signed)
Refilled

## 2018-01-08 NOTE — Telephone Encounter (Signed)
Called the Patient and informed him that his medications have been filled.

## 2018-02-08 ENCOUNTER — Ambulatory Visit: Payer: Self-pay | Attending: Internal Medicine | Admitting: Internal Medicine

## 2018-02-08 ENCOUNTER — Encounter: Payer: Self-pay | Admitting: Internal Medicine

## 2018-02-08 VITALS — BP 143/92 | HR 91 | Temp 98.9°F | Resp 16 | Ht 69.0 in | Wt 234.2 lb

## 2018-02-08 DIAGNOSIS — B353 Tinea pedis: Secondary | ICD-10-CM | POA: Insufficient documentation

## 2018-02-08 DIAGNOSIS — Z125 Encounter for screening for malignant neoplasm of prostate: Secondary | ICD-10-CM

## 2018-02-08 DIAGNOSIS — I10 Essential (primary) hypertension: Secondary | ICD-10-CM | POA: Insufficient documentation

## 2018-02-08 DIAGNOSIS — G4733 Obstructive sleep apnea (adult) (pediatric): Secondary | ICD-10-CM | POA: Insufficient documentation

## 2018-02-08 DIAGNOSIS — E119 Type 2 diabetes mellitus without complications: Secondary | ICD-10-CM | POA: Insufficient documentation

## 2018-02-08 DIAGNOSIS — IMO0001 Reserved for inherently not codable concepts without codable children: Secondary | ICD-10-CM

## 2018-02-08 DIAGNOSIS — Z6830 Body mass index (BMI) 30.0-30.9, adult: Secondary | ICD-10-CM | POA: Insufficient documentation

## 2018-02-08 DIAGNOSIS — E785 Hyperlipidemia, unspecified: Secondary | ICD-10-CM | POA: Insufficient documentation

## 2018-02-08 DIAGNOSIS — E1165 Type 2 diabetes mellitus with hyperglycemia: Secondary | ICD-10-CM

## 2018-02-08 DIAGNOSIS — E669 Obesity, unspecified: Secondary | ICD-10-CM | POA: Insufficient documentation

## 2018-02-08 DIAGNOSIS — Z7984 Long term (current) use of oral hypoglycemic drugs: Secondary | ICD-10-CM | POA: Insufficient documentation

## 2018-02-08 DIAGNOSIS — Z1211 Encounter for screening for malignant neoplasm of colon: Secondary | ICD-10-CM

## 2018-02-08 DIAGNOSIS — Z23 Encounter for immunization: Secondary | ICD-10-CM

## 2018-02-08 LAB — POCT GLYCOSYLATED HEMOGLOBIN (HGB A1C): HBA1C, POC (CONTROLLED DIABETIC RANGE): 9.2 % — AB (ref 0.0–7.0)

## 2018-02-08 LAB — GLUCOSE, POCT (MANUAL RESULT ENTRY): POC GLUCOSE: 213 mg/dL — AB (ref 70–99)

## 2018-02-08 MED ORDER — PRAVASTATIN SODIUM 20 MG PO TABS: 20.0000 mg | ORAL_TABLET | Freq: Every day | ORAL | 1 refills | Status: DC

## 2018-02-08 MED ORDER — GLIPIZIDE 5 MG PO TABS: 5.0000 mg | ORAL_TABLET | Freq: Two times a day (BID) | ORAL | 1 refills | Status: DC

## 2018-02-08 MED ORDER — LISINOPRIL 10 MG PO TABS: 15.0000 mg | ORAL_TABLET | Freq: Every day | ORAL | 1 refills | Status: DC

## 2018-02-08 MED ORDER — ZOSTER VAC RECOMB ADJUVANTED 50 MCG/0.5ML IM SUSR: 0.5000 mL | Freq: Once | INTRAMUSCULAR | 0 refills | Status: AC

## 2018-02-08 MED FILL — PRAVASTATIN SODIUM 20 MG TA: 20 | 30 days supply | Qty: 30 | Fill #0

## 2018-02-08 MED FILL — LISINOPRIL 10 MG TABS: 10 | 30 days supply | Qty: 45 | Fill #0

## 2018-02-08 MED FILL — glipiZIDE 5 MG TABS: 5 | 30 days supply | Qty: 60 | Fill #0

## 2018-02-08 NOTE — Progress Notes (Signed)
Patient ID: Johnny Nichols, male    DOB: 1965/09/20  MRN: [id number]  CC: re-establish; Diabetes; and Hypertension   Subjective: Johnny Nichols is a 52 y.o. male who presents for chronic ds management and to est with me as PCP.  Previously saw Dr. Hyman Hopes His concerns today include:  DM, HTN, OSA, HL  DIABETES TYPE 2 Last A1C:   Results for orders placed or performed in visit on 02/08/18  POCT glucose (manual entry)  Result Value Ref Range   POC Glucose 213 (A) 70 - 99 mg/dl  POCT glycosylated hemoglobin (Hb A1C)  Result Value Ref Range   Hemoglobin A1C  4.0 - 5.6 %   HbA1c, POC (prediabetic range)  5.7 - 6.4 %   HbA1c, POC (controlled diabetic range) 9.2 (A) 0.0 - 7.0 %    Med Adherence:  [x]  Yes; more consistent.  "I still sometimes forget my evening Metformin about 2-3 times a week."    []  No Medication side effects:  [x]  Yes    []  No Home Monitoring?  [x]  Yes, BID    []  No Home glucose results range: after BF 130-140; at nights before meals 80-90s; after meal is around 110 Diet Adherence: []  Yes    [x]  No, lots of pasta and white bread.  Had a sausage bisket for BF. Drinks diet sodas, unsweet tea and water.  Exercise: [x]  Yes; just got back to the gym a wk ago.  Went 3 times.  Plans to go at least 3 x a wk.  Wants to lose 30 lbs by October to go on a cruise.     []  No Hypoglycemic episodes?: [x]  Yes, "Only once and that was because I hadn't eaten."  Keeps glucose tabs with him    []  No Numbness of the feet? []  Yes    [x]  No Retinopathy hx? []  Yes    [x]  No Last eye exam: 2 yrs ago Comments:   HTN:  Compliant with Lisinopril. Limits salt in foods No chest pains or shortness of breath at rest on exertion.  HL:  Compliant with pravastatin.   Patient Active Problem List   Diagnosis Date Noted  . Obesity (BMI 30.0-34.9) 02/08/2018  . Hyperlipidemia 10/25/2015  . Essential hypertension 10/11/2015  . Diabetes mellitus without complication (HCC) 05/01/2014  .  Dental caries 04/04/2013  . OSA (obstructive sleep apnea) 03/16/2013     Current Outpatient Medications on File Prior to Visit  Medication Sig Dispense Refill  . metFORMIN (GLUCOPHAGE) 1000 MG tablet TAKE 1 TABLET BY MOUTH 2 TIMES DAILY WITH A MEAL. 60 tablet 0   No current facility-administered medications on file prior to visit.     Allergies  Allergen Reactions  . Clindamycin/Lincomycin     Social History   Socioeconomic History  . Marital status: Divorced    Spouse name: Not on file  . Number of children: Not on file  . Years of education: Not on file  . Highest education level: Not on file  Occupational History  . Not on file  Social Needs  . Financial resource strain: Not on file  . Food insecurity:    Worry: Not on file    Inability: Not on file  . Transportation needs:    Medical: Not on file    Non-medical: Not on file  Tobacco Use  . Smoking status: Never Smoker  . Smokeless tobacco: Never Used  Substance and Sexual Activity  . Alcohol use: No    Comment: no  drink x 3 weeks  . Drug use: No  . Sexual activity: Not on file  Lifestyle  . Physical activity:    Days per week: Not on file    Minutes per session: Not on file  . Stress: Not on file  Relationships  . Social connections:    Talks on phone: Not on file    Gets together: Not on file    Attends religious service: Not on file    Active member of club or organization: Not on file    Attends meetings of clubs or organizations: Not on file    Relationship status: Not on file  . Intimate partner violence:    Fear of current or ex partner: Not on file    Emotionally abused: Not on file    Physically abused: Not on file    Forced sexual activity: Not on file  Other Topics Concern  . Not on file  Social History Narrative  . Not on file    Family History  Problem Relation Age of Onset  . Heart disease Mother   . Cancer Father   . Hypertension Other   . Obesity Other     No past surgical  history on file.  ROS: Review of Systems Skin: Has a patch of tinea pedis on the medial aspect of the right foot.  He has been using some over-the-counter miconazole cream PHYSICAL EXAM: BP (!) 143/92   Pulse 91   Temp 98.9 F (37.2 C) (Oral)   Resp 16   Ht 5\' 9"  (1.753 m)   Wt 234 lb 3.2 oz (106.2 kg)   SpO2 97%   BMI 34.59 kg/m   Wt Readings from Last 3 Encounters:  02/08/18 234 lb 3.2 oz (106.2 kg)  05/06/17 236 lb 6.4 oz (107.2 kg)  10/25/15 231 lb (104.8 kg)    Physical Exam General appearance - alert, well appearing, middle-aged Caucasian male and in no distress Mental status - normal mood, behavior, speech, dress, motor activity, and thought processes Neck - supple, no significant adenopathy Chest - clear to auscultation, no wheezes, rales or rhonchi, symmetric air entry Heart - normal rate, regular rhythm, normal S1, S2, no murmurs, rubs, clicks or gallops Extremities - peripheral pulses normal, no pedal edema, no clubbing or cyanosis Skin -small slightly erythematous patch medial aspect of the right heel Diabetic Foot Exam - Simple   Simple Foot Form Visual Inspection See comments:  Yes Sensation Testing Intact to touch and monofilament testing bilaterally:  Yes Pulse Check Posterior Tibialis and Dorsalis pulse intact bilaterally:  Yes Comments Ingrown toe nails both big toes.  Small area of tinea pedis on medial aspect RT foot     Results for orders placed or performed in visit on 02/08/18  POCT glucose (manual entry)  Result Value Ref Range   POC Glucose 213 (A) 70 - 99 mg/dl  POCT glycosylated hemoglobin (Hb A1C)  Result Value Ref Range   Hemoglobin A1C  4.0 - 5.6 %   HbA1c, POC (prediabetic range)  5.7 - 6.4 %   HbA1c, POC (controlled diabetic range) 9.2 (A) 0.0 - 7.0 %    ASSESSMENT AND PLAN: 1. Diabetes mellitus type 2, uncontrolled, without complications (HCC) Not at goal.  Discussed healthy eating habits and importance of regular exercise.   Commended him on starting recent exercise program.  He will work on taking the Metformin twice a day every day as prescribed.  Increase glipizide to 5 mg twice a day -  Encourage him to have yearly eye exams.  Some of the places he can have it done at reasonable prices are [facility], [facility] or [facility].  He will look into it. - POCT glucose (manual entry) - POCT glycosylated hemoglobin (Hb A1C) - CBC - Comprehensive metabolic panel - Lipid panel - glipiZIDE (GLUCOTROL) 5 MG tablet; Take 1 tablet (5 mg total) by mouth 2 (two) times daily before a meal.  Dispense: 180 tablet; Refill: 1  2. Obesity (BMI 30.0-34.9) See #1 above  3. Essential hypertension Not at goal.  Increase lisinopril to 15 mg daily.  Continue to limit salt in the foods. - lisinopril (PRINIVIL,ZESTRIL) 10 MG tablet; Take 1.5 tablets (15 mg total) by mouth daily.  Dispense: 135 tablet; Refill: 1  4. Dyslipidemia - pravastatin (PRAVACHOL) 20 MG tablet; Take 1 tablet (20 mg total) by mouth daily.  Dispense: 90 tablet; Refill: 1  5. Colon cancer screening - Fecal occult blood, imunochemical([facility])  6. Need for shingles vaccine Pharmacy has applied for Shingrix vaccine for him.  He will be called for it to be administered once it comes in  7. Tinea pedis of right foot Advised to keep the feet clean and dry.  He can continue to use over-the-counter antifungal cream  8. Prostate cancer screening Discussed prostate cancer screening.  Patient agreeable to being screened with PSA level - PSA  Patient was given the opportunity to ask questions.  Patient verbalized understanding of the plan and was able to repeat key elements of the plan.   Orders Placed This Encounter  Procedures  . Fecal occult blood, imunochemical([facility])  . CBC  . Comprehensive metabolic panel  . Lipid panel  . PSA  . POCT glucose (manual entry)  . POCT glycosylated hemoglobin (Hb A1C)     Requested Prescriptions    Signed Prescriptions Disp Refills  . glipiZIDE (GLUCOTROL) 5 MG tablet 180 tablet 1    Sig: Take 1 tablet (5 mg total) by mouth 2 (two) times daily before a meal.  . lisinopril (PRINIVIL,ZESTRIL) 10 MG tablet 135 tablet 1    Sig: Take 1.5 tablets (15 mg total) by mouth daily.  . pravastatin (PRAVACHOL) 20 MG tablet 90 tablet 1    Sig: Take 1 tablet (20 mg total) by mouth daily.  Marland Kitchen. Zoster Vaccine Adjuvanted [facility](SHINGRIX) injection 0.5 mL 0    Sig: Inject 0.5 mLs into the muscle once for 1 dose.    Return in about 3 months (around 05/11/2018).  Jonah Blueeborah Prescott Truex, MD, FACP

## 2018-02-08 NOTE — Patient Instructions (Signed)
Increase Glipizide to 5 mg twice a day. Try to take the Metformin consistently twice a day.   Your blood pressure is not at goal of 130/80 or lower.  Increase Lisinopril to 10 mg 1 1/2 tab daily.  Continue to limit salt intake.  Follow a Healthy Eating Plan - You can do it! Limit sugary drinks.  Avoid sodas, sweet tea, sport or energy drinks, or fruit drinks.  Drink water, lo-fat milk, or diet drinks. Limit snack foods.   Cut back on candy, cake, cookies, chips, ice cream.  These are a special treat, only in small amounts. Eat plenty of vegetables.  Especially dark green, red, and orange vegetables. Aim for at least 3 servings a day. More is better! Include fruit in your daily diet.  Whole fruit is much healthier than fruit juice! Limit "white" bread, "white" pasta, "white" rice.   Choose "100% whole grain" products, brown or wild rice. Avoid fatty meats. Try "Meatless Monday" and choose eggs or beans one day a week.  When eating meat, choose lean meats like chicken, [place], and fish.  Grill, broil, or bake meats instead of frying, and eat poultry without the skin. Eat less salt.  Avoid frozen pizzas, frozen dinners and salty foods.  Use seasonings other than salt in cooking.  This can help blood pressure and keep you from swelling Beer, wine and liquor have calories.  If you can safely drink alcohol, limit to 1 drink per day for women, 2 drinks for men

## 2018-02-09 LAB — COMPREHENSIVE METABOLIC PANEL
A/G RATIO: 1.4 (ref 1.2–2.2)
ALBUMIN: 4.1 g/dL (ref 3.5–5.5)
ALT: 20 IU/L (ref 0–44)
AST: 13 IU/L (ref 0–40)
Alkaline Phosphatase: 66 IU/L (ref 39–117)
BUN / CREAT RATIO: 13 (ref 9–20)
BUN: 12 mg/dL (ref 6–24)
Bilirubin Total: 0.3 mg/dL (ref 0.0–1.2)
CALCIUM: 9.4 mg/dL (ref 8.7–10.2)
CO2: 20 mmol/L (ref 20–29)
Chloride: 102 mmol/L (ref 96–106)
Creatinine, Ser: 0.92 mg/dL (ref 0.76–1.27)
GFR calc Af Amer: 111 mL/min/{1.73_m2} (ref >59)
GFR, EST NON AFRICAN AMERICAN: 96 mL/min/{1.73_m2} (ref >59)
GLOBULIN, TOTAL: 2.9 g/dL (ref 1.5–4.5)
Glucose: 219 mg/dL — ABNORMAL HIGH (ref 65–99)
POTASSIUM: 4 mmol/L (ref 3.5–5.2)
Sodium: 138 mmol/L (ref 134–144)
Total Protein: 7 g/dL (ref 6.0–8.5)

## 2018-02-09 LAB — CBC
Hematocrit: 42.2 % (ref 37.5–51.0)
Hemoglobin: 14.4 g/dL (ref 13.0–17.7)
MCH: 29.3 pg (ref 26.6–33.0)
MCHC: 34.1 g/dL (ref 31.5–35.7)
MCV: 86 fL (ref 79–97)
Platelets: 270 10*3/uL (ref 150–450)
RBC: 4.92 x10E6/uL (ref 4.14–5.80)
RDW: 13.4 % (ref 12.3–15.4)
WBC: 8.1 10*3/uL (ref 3.4–10.8)

## 2018-02-09 LAB — LIPID PANEL
CHOL/HDL RATIO: 5.8 ratio — AB (ref 0.0–5.0)
Cholesterol, Total: 220 mg/dL — ABNORMAL HIGH (ref 100–199)
HDL: 38 mg/dL — AB (ref >39)
LDL Calculated: 164 mg/dL — ABNORMAL HIGH (ref 0–99)
Triglycerides: 88 mg/dL (ref 0–149)
VLDL Cholesterol Cal: 18 mg/dL (ref 5–40)

## 2018-02-09 LAB — PSA: PROSTATE SPECIFIC AG, SERUM: 0.5 ng/mL (ref 0.0–4.0)

## 2018-02-10 ENCOUNTER — Telehealth: Payer: Self-pay

## 2018-02-10 ENCOUNTER — Other Ambulatory Visit: Payer: Self-pay | Admitting: Internal Medicine

## 2018-02-10 ENCOUNTER — Other Ambulatory Visit: Payer: Self-pay

## 2018-02-10 DIAGNOSIS — E119 Type 2 diabetes mellitus without complications: Secondary | ICD-10-CM

## 2018-02-10 DIAGNOSIS — I10 Essential (primary) hypertension: Secondary | ICD-10-CM

## 2018-02-10 DIAGNOSIS — E785 Hyperlipidemia, unspecified: Secondary | ICD-10-CM

## 2018-02-10 MED ORDER — PRAVASTATIN SODIUM 40 MG PO TABS: 40.0000 mg | ORAL_TABLET | Freq: Every day | ORAL | 1 refills | Status: DC

## 2018-02-10 MED ORDER — METFORMIN HCL 1000 MG PO TABS: ORAL_TABLET | ORAL | 1 refills | Status: DC

## 2018-02-10 MED FILL — metFORMIN HCL 1000 MG TABS: 1000 | 30 days supply | Qty: 60 | Fill #0

## 2018-02-10 NOTE — Telephone Encounter (Signed)
Contacted pt to go over lab results pt is aware and doesn't have any questions or concerns 

## 2018-02-23 MED FILL — PRAVASTATIN NA 40 MG TAB: 40 | 30 days supply | Qty: 30 | Fill #0

## 2018-03-09 MED FILL — metFORMIN HCL 1000 MG TABS: 1000 | 30 days supply | Qty: 60 | Fill #1

## 2018-03-09 MED FILL — glipiZIDE 5 MG TABS: 5 | 30 days supply | Qty: 60 | Fill #1

## 2018-03-09 MED FILL — LISINOPRIL 10 MG TABS: 10 | 30 days supply | Qty: 45 | Fill #1

## 2018-03-29 MED FILL — PRAVASTATIN NA 40 MG TAB: 40 | 30 days supply | Qty: 30 | Fill #1

## 2018-04-13 MED FILL — LISINOPRIL 10 MG TABS: 10 | 30 days supply | Qty: 45 | Fill #2

## 2018-04-13 MED FILL — metFORMIN HCL 1000 MG TABS: 1000 | 30 days supply | Qty: 60 | Fill #2

## 2018-04-13 MED FILL — glipiZIDE 5 MG TABS: 5 | 30 days supply | Qty: 60 | Fill #2

## 2018-04-16 MED FILL — PRAVASTATIN NA 40 MG TAB: 40 | 30 days supply | Qty: 30 | Fill #2

## 2018-05-13 ENCOUNTER — Ambulatory Visit: Payer: Self-pay | Admitting: Internal Medicine

## 2018-05-18 MED FILL — LISINOPRIL 10 MG TABS: 10 | 30 days supply | Qty: 45 | Fill #3

## 2018-05-18 MED FILL — metFORMIN HCL 1000 MG TABS: 1000 | 30 days supply | Qty: 60 | Fill #3

## 2018-05-18 MED FILL — PRAVASTATIN NA 40 MG TAB: 40 | 30 days supply | Qty: 30 | Fill #3

## 2018-05-18 MED FILL — glipiZIDE 5 MG TABS: 5 | 30 days supply | Qty: 60 | Fill #3

## 2018-06-17 ENCOUNTER — Ambulatory Visit: Payer: Self-pay | Admitting: Internal Medicine

## 2018-06-22 MED FILL — metFORMIN HCL 1000 MG TABS: 1000 | 30 days supply | Qty: 60 | Fill #4

## 2018-06-22 MED FILL — LISINOPRIL 10 MG TABS: 10 | 30 days supply | Qty: 45 | Fill #4

## 2018-06-22 MED FILL — PRAVASTATIN NA 40 MG TAB: 40 | 30 days supply | Qty: 30 | Fill #4

## 2018-06-22 MED FILL — glipiZIDE 5 MG TABS: 5 | 30 days supply | Qty: 60 | Fill #4

## 2018-07-02 ENCOUNTER — Encounter: Payer: Self-pay | Admitting: Internal Medicine

## 2018-07-02 ENCOUNTER — Other Ambulatory Visit: Payer: Self-pay | Admitting: Pharmacist

## 2018-07-02 ENCOUNTER — Ambulatory Visit: Payer: Self-pay | Attending: Internal Medicine | Admitting: Internal Medicine

## 2018-07-02 VITALS — BP 137/85 | HR 85 | Temp 98.1°F | Resp 16 | Wt 233.0 lb

## 2018-07-02 DIAGNOSIS — Z23 Encounter for immunization: Secondary | ICD-10-CM | POA: Insufficient documentation

## 2018-07-02 DIAGNOSIS — E669 Obesity, unspecified: Secondary | ICD-10-CM | POA: Insufficient documentation

## 2018-07-02 DIAGNOSIS — Z8249 Family history of ischemic heart disease and other diseases of the circulatory system: Secondary | ICD-10-CM | POA: Insufficient documentation

## 2018-07-02 DIAGNOSIS — Z79899 Other long term (current) drug therapy: Secondary | ICD-10-CM | POA: Insufficient documentation

## 2018-07-02 DIAGNOSIS — Z881 Allergy status to other antibiotic agents status: Secondary | ICD-10-CM | POA: Insufficient documentation

## 2018-07-02 DIAGNOSIS — IMO0001 Reserved for inherently not codable concepts without codable children: Secondary | ICD-10-CM

## 2018-07-02 DIAGNOSIS — G4733 Obstructive sleep apnea (adult) (pediatric): Secondary | ICD-10-CM | POA: Insufficient documentation

## 2018-07-02 DIAGNOSIS — Z6834 Body mass index (BMI) 34.0-34.9, adult: Secondary | ICD-10-CM | POA: Insufficient documentation

## 2018-07-02 DIAGNOSIS — Z1211 Encounter for screening for malignant neoplasm of colon: Secondary | ICD-10-CM | POA: Insufficient documentation

## 2018-07-02 DIAGNOSIS — I10 Essential (primary) hypertension: Secondary | ICD-10-CM | POA: Insufficient documentation

## 2018-07-02 DIAGNOSIS — Z7984 Long term (current) use of oral hypoglycemic drugs: Secondary | ICD-10-CM | POA: Insufficient documentation

## 2018-07-02 DIAGNOSIS — E119 Type 2 diabetes mellitus without complications: Secondary | ICD-10-CM

## 2018-07-02 DIAGNOSIS — E785 Hyperlipidemia, unspecified: Secondary | ICD-10-CM | POA: Insufficient documentation

## 2018-07-02 DIAGNOSIS — E1165 Type 2 diabetes mellitus with hyperglycemia: Secondary | ICD-10-CM | POA: Insufficient documentation

## 2018-07-02 LAB — POCT GLYCOSYLATED HEMOGLOBIN (HGB A1C): HBA1C, POC (CONTROLLED DIABETIC RANGE): 9.6 % — AB (ref 0.0–7.0)

## 2018-07-02 LAB — GLUCOSE, POCT (MANUAL RESULT ENTRY): POC Glucose: 221 mg/dl — AB (ref 70–99)

## 2018-07-02 MED ORDER — DULAGLUTIDE 0.75 MG/0.5ML ~~LOC~~ SOAJ: 0.7500 mg | SUBCUTANEOUS | 5 refills | Status: DC

## 2018-07-02 MED ORDER — LISINOPRIL 20 MG PO TABS: 20.0000 mg | ORAL_TABLET | Freq: Every day | ORAL | 3 refills | Status: DC

## 2018-07-02 MED ORDER — TRUEPLUS LANCETS 28G MISC: 11 refills | Status: DC

## 2018-07-02 MED ORDER — GLUCOSE BLOOD VI STRP: ORAL_STRIP | 11 refills | Status: DC

## 2018-07-02 MED ORDER — TRUE METRIX METER W/DEVICE KIT: PACK | 0 refills | Status: DC

## 2018-07-02 MED FILL — !TRULICITY 0.75 MG/0.5 ML P: 0.75 | 28 days supply | Qty: 4 | Fill #0

## 2018-07-02 MED FILL — TRUE METRIX BLOOD GLUCOSE M: W/DEVICE | 1 days supply | Qty: 1 | Fill #0

## 2018-07-02 MED FILL — LISINOPRIL 20 MG TAB: 20 | 30 days supply | Qty: 30 | Fill #0

## 2018-07-02 MED FILL — TRUE METRIX TEST STRIP: 30 days supply | Qty: 100 | Fill #0

## 2018-07-02 MED FILL — TRUEplus LANCETS 28G MISC: 30 days supply | Qty: 100 | Fill #0

## 2018-07-02 NOTE — Progress Notes (Signed)
Patient ID: Johnny Nichols, male    DOB: [birth year]  MRN: [id number]  CC: Diabetes   Subjective: Johnny Nichols is a 52 y.o. male who presents for chronic ds management His concerns today include:  DM, HTN, OSA, HL  DM: has an old meter.  Readings were fluctuating and test stripes have expired. Eating habits:  Plans to attending healthy eating program classes x 6 wks through his gym.  Will be going to first class today. Exercise: goes to gym 2-3 x a wk, uses eliptical, treadmill and some wgh Meds: compliant with Metformin and Glucotrol. He was out of the later for a wk about 2 wks ago He still did not get eye exam as yet  HTN:  Compliant with Lisinopril.  Has device to check blood pressure but he needs to get batteries.  Limits salt in foods.  HL:  LDL was not at goal on last visit.  I recommended increasing Pravachol to 40 mg daily.  He is taking the increased dose and tolerating it.    HM: He mailed in the fit test but we did not receive the results.  We will give him a new packet today.  Patient Active Problem List   Diagnosis Date Noted  . Obesity (BMI 30.0-34.9) 02/08/2018  . Hyperlipidemia 10/25/2015  . Essential hypertension 10/11/2015  . Diabetes mellitus without complication (HCC) 05/01/2014  . Dental caries 04/04/2013  . OSA (obstructive sleep apnea) 03/16/2013     Current Outpatient Medications on File Prior to Visit  Medication Sig Dispense Refill  . glipiZIDE (GLUCOTROL) 5 MG tablet Take 1 tablet (5 mg total) by mouth 2 (two) times daily before a meal. 180 tablet 1  . metFORMIN (GLUCOPHAGE) 1000 MG tablet TAKE 1 TABLET BY MOUTH 2 TIMES DAILY WITH A MEAL. 180 tablet 1  . pravastatin (PRAVACHOL) 40 MG tablet Take 1 tablet (40 mg total) by mouth daily. 90 tablet 1   No current facility-administered medications on file prior to visit.     Allergies  Allergen Reactions  . Clindamycin/Lincomycin     Social History   Socioeconomic History  . Marital  status: Divorced    Spouse name: Not on file  . Number of children: Not on file  . Years of education: Not on file  . Highest education level: Not on file  Occupational History  . Not on file  Social Needs  . Financial resource strain: Not on file  . Food insecurity:    Worry: Not on file    Inability: Not on file  . Transportation needs:    Medical: Not on file    Non-medical: Not on file  Tobacco Use  . Smoking status: Never Smoker  . Smokeless tobacco: Never Used  Substance and Sexual Activity  . Alcohol use: No    Comment: no drink x 3 weeks  . Drug use: No  . Sexual activity: Not on file  Lifestyle  . Physical activity:    Days per week: Not on file    Minutes per session: Not on file  . Stress: Not on file  Relationships  . Social connections:    Talks on phone: Not on file    Gets together: Not on file    Attends religious service: Not on file    Active member of club or organization: Not on file    Attends meetings of clubs or organizations: Not on file    Relationship status: Not on file  . Intimate partner  violence:    Fear of current or ex partner: Not on file    Emotionally abused: Not on file    Physically abused: Not on file    Forced sexual activity: Not on file  Other Topics Concern  . Not on file  Social History Narrative  . Not on file    Family History  Problem Relation Age of Onset  . Heart disease Mother   . Cancer Father   . Hypertension Other   . Obesity Other     No past surgical history on file.  ROS: Review of Systems Neg except as stated above  PHYSICAL EXAM: BP 137/85   Pulse 85   Temp 98.1 F (36.7 C) (Oral)   Resp 16   Wt 233 lb (105.7 kg)   SpO2 98%   BMI 34.41 kg/m   Physical Exam  General appearance - alert, well appearing, and in no distress Mental status - alert, oriented to person, place, and time Mouth - mucous membranes moist, pharynx normal without lesions Neck - supple, no significant adenopathy Chest  - clear to auscultation, no wheezes, rales or rhonchi, symmetric air entry Extremities - peripheral pulses normal, no pedal edema, no clubbing or cyanosis  Results for orders placed or performed in visit on 07/02/18  POCT glucose (manual entry)  Result Value Ref Range   POC Glucose 221 (A) 70 - 99 mg/dl  POCT glycosylated hemoglobin (Hb A1C)  Result Value Ref Range   Hemoglobin A1C     HbA1c POC (<> result, manual entry)     HbA1c, POC (prediabetic range)     HbA1c, POC (controlled diabetic range) 9.6 (A) 0.0 - 7.0 %   Lab Results  Component Value Date   CHOL 220 (H) 02/08/2018   HDL 38 (L) 02/08/2018   LDLCALC 164 (H) 02/08/2018   TRIG 88 02/08/2018   CHOLHDL 5.8 (H) 02/08/2018     Chemistry      Component Value Date/Time   NA 138 02/08/2018 1056   K 4.0 02/08/2018 1056   CL 102 02/08/2018 1056   CO2 20 02/08/2018 1056   BUN 12 02/08/2018 1056   CREATININE 0.92 02/08/2018 1056   CREATININE 0.95 10/11/2015 1541      Component Value Date/Time   CALCIUM 9.4 02/08/2018 1056   ALKPHOS 66 02/08/2018 1056   AST 13 02/08/2018 1056   ALT 20 02/08/2018 1056   BILITOT 0.3 02/08/2018 1056     A1C:  9.6 ASSESSMENT AND PLAN:  1. Uncontrolled type 2 diabetes mellitus without complication, without long-term current use of insulin (HCC) Not at goal.  He is wanting to get his blood sugars better and would also like to achieve some weight loss.  We discussed adding Trulicity.  I think this will help him achieve both.  Healthy eating habits encourage.  Continue regular exercise. - POCT glucose (manual entry) - POCT glycosylated hemoglobin (Hb A1C) - Microalbumin / creatinine urine ratio - Dulaglutide (TRULICITY) 0.75 MG/0.5ML SOPN; Inject 0.75 mg into the skin once a week.  Dispense: 4 pen; Refill: 5  2. Obesity (BMI 30.0-34.9) See #1 above  3. Essential hypertension Not at goal.  Increase lisinopril to 20 mg daily - lisinopril (PRINIVIL,ZESTRIL) 20 MG tablet; Take 1 tablet (20  mg total) by mouth daily.  Dispense: 90 tablet; Refill: 3  4. Dyslipidemia Continue Pravachol  5. Need for immunization against influenza - Flu Vaccine QUAD 36+ mos IM  6.  Colon cancer screening Fit test given  today.  Patient was given the opportunity to ask questions.  Patient verbalized understanding of the plan and was able to repeat key elements of the plan.   Orders Placed This Encounter  Procedures  . Fecal occult blood, imunochemical([facility])  . Flu Vaccine QUAD 36+ mos IM  . Microalbumin / creatinine urine ratio  . POCT glucose (manual entry)  . POCT glycosylated hemoglobin (Hb A1C)     Requested Prescriptions   Signed Prescriptions Disp Refills  . Dulaglutide (TRULICITY) 0.75 MG/0.5ML SOPN 4 pen 5    Sig: Inject 0.75 mg into the skin once a week.  Marland Kitchen lisinopril (PRINIVIL,ZESTRIL) 20 MG tablet 90 tablet 3    Sig: Take 1 tablet (20 mg total) by mouth daily.    Return in about 3 months (around 10/02/2018).  Jonah Blue, MD, FACP

## 2018-07-02 NOTE — Patient Instructions (Addendum)
Your diabetes is not well controlled.  Went to have your glucometer, try to check your blood sugars at least once a day and record the readings. Start Trulicity injections once a week.  Your blood pressure is not controlled.  The goal is 130/80 or lower.  I recommend increase lisinopril to 20 mg daily.  Influenza Virus Vaccine injection (Fluarix) What is this medicine? INFLUENZA VIRUS VACCINE (in floo EN zuh VAHY ruhs vak SEEN) helps to reduce the risk of getting influenza also known as the flu. This medicine may be used for other purposes; ask your health care provider or pharmacist if you have questions. COMMON BRAND NAME(S): Fluarix, Fluzone What should I tell my health care provider before I take this medicine? They need to know if you have any of these conditions: -bleeding disorder like hemophilia -fever or infection -Guillain-Barre syndrome or other neurological problems -immune system problems -infection with the human immunodeficiency virus (HIV) or AIDS -low blood platelet counts -multiple sclerosis -an unusual or allergic reaction to influenza virus vaccine, eggs, chicken proteins, latex, gentamicin, other medicines, foods, dyes or preservatives -pregnant or trying to get pregnant -breast-feeding How should I use this medicine? This vaccine is for injection into a muscle. It is given by a health care professional. A copy of Vaccine Information Statements will be given before each vaccination. Read this sheet carefully each time. The sheet may change frequently. Talk to your pediatrician regarding the use of this medicine in children. Special care may be needed. Overdosage: If you think you have taken too much of this medicine contact a poison control center or emergency room at once. NOTE: This medicine is only for you. Do not share this medicine with others. What if I miss a dose? This does not apply. What may interact with this medicine? -chemotherapy or radiation  therapy -medicines that lower your immune system like etanercept, anakinra, infliximab, and adalimumab -medicines that treat or prevent blood clots like warfarin -phenytoin -steroid medicines like prednisone or cortisone -theophylline -vaccines This list may not describe all possible interactions. Give your health care provider a list of all the medicines, herbs, non-prescription drugs, or dietary supplements you use. Also tell them if you smoke, drink alcohol, or use illegal drugs. Some items may interact with your medicine. What should I watch for while using this medicine? Report any side effects that do not go away within 3 days to your doctor or health care professional. Call your health care provider if any unusual symptoms occur within 6 weeks of receiving this vaccine. You may still catch the flu, but the illness is not usually as bad. You cannot get the flu from the vaccine. The vaccine will not protect against colds or other illnesses that may cause fever. The vaccine is needed every year. What side effects may I notice from receiving this medicine? Side effects that you should report to your doctor or health care professional as soon as possible: -allergic reactions like skin rash, itching or hives, swelling of the face, lips, or tongue Side effects that usually do not require medical attention (report to your doctor or health care professional if they continue or are bothersome): -fever -headache -muscle aches and pains -pain, tenderness, redness, or swelling at site where injected -weak or tired This list may not describe all possible side effects. Call your doctor for medical advice about side effects. You may report side effects to FDA at 1-800-FDA-1088. Where should I keep my medicine? This vaccine is only given in  a clinic, pharmacy, doctor's office, or other health care setting and will not be stored at home. NOTE: This sheet is a summary. It may not cover all possible  information. If you have questions about this medicine, talk to your doctor, pharmacist, or health care provider.  2018 Elsevier/Gold Standard (2008-03-15 09:30:40)

## 2018-07-03 LAB — MICROALBUMIN / CREATININE URINE RATIO
Creatinine, Urine: 98.4 mg/dL
MICROALB/CREAT RATIO: 29.7 mg/g{creat} (ref 0.0–30.0)
Microalbumin, Urine: 29.2 ug/mL

## 2018-07-23 MED FILL — LISINOPRIL 20 MG TAB: 20 | 30 days supply | Qty: 30 | Fill #1

## 2018-07-23 MED FILL — PRAVASTATIN NA 40 MG TAB: 40 | 30 days supply | Qty: 30 | Fill #5

## 2018-07-23 MED FILL — glipiZIDE 5 MG TABS: 5 | 30 days supply | Qty: 60 | Fill #5

## 2018-07-23 MED FILL — metFORMIN HCL 1000 MG TABS: 1000 | 30 days supply | Qty: 60 | Fill #5

## 2018-07-23 MED FILL — !TRULICITY 0.75 MG/0.5 ML P: 0.75 | 28 days supply | Qty: 4 | Fill #1

## 2018-08-18 ENCOUNTER — Other Ambulatory Visit: Payer: Self-pay | Admitting: Internal Medicine

## 2018-08-18 DIAGNOSIS — E785 Hyperlipidemia, unspecified: Secondary | ICD-10-CM

## 2018-08-18 DIAGNOSIS — IMO0001 Reserved for inherently not codable concepts without codable children: Secondary | ICD-10-CM

## 2018-08-18 DIAGNOSIS — E1165 Type 2 diabetes mellitus with hyperglycemia: Secondary | ICD-10-CM

## 2018-08-18 DIAGNOSIS — E119 Type 2 diabetes mellitus without complications: Secondary | ICD-10-CM

## 2018-08-18 MED FILL — LISINOPRIL 20 MG TAB: 20 | 30 days supply | Qty: 30 | Fill #2

## 2018-08-18 MED FILL — glipiZIDE 5 MG TABS: 5 | 30 days supply | Qty: 60 | Fill #0

## 2018-08-18 MED FILL — metFORMIN HCL 1000 MG TABS: 1000 | 30 days supply | Qty: 60 | Fill #0

## 2018-08-18 MED FILL — !TRULICITY 0.75 MG/0.5 ML P: 0.75 | 28 days supply | Qty: 2 | Fill #2

## 2018-08-18 MED FILL — PRAVASTATIN NA 40 MG TAB: 40 | 30 days supply | Qty: 30 | Fill #0

## 2018-09-24 MED FILL — !TRULICITY 0.75 MG/0.5 ML P: 0.75 | 28 days supply | Qty: 2 | Fill #3

## 2018-09-27 MED FILL — glipiZIDE 5 MG TABS: 5 | 30 days supply | Qty: 60 | Fill #1

## 2018-09-27 MED FILL — PRAVASTATIN NA 40 MG TAB: 40 | 30 days supply | Qty: 30 | Fill #1

## 2018-09-27 MED FILL — LISINOPRIL 20 MG TAB: 20 | 30 days supply | Qty: 30 | Fill #3

## 2018-09-27 MED FILL — metFORMIN HCL 1000 MG TABS: 1000 | 30 days supply | Qty: 60 | Fill #1

## 2018-10-04 ENCOUNTER — Ambulatory Visit: Payer: Self-pay | Admitting: Internal Medicine

## 2018-10-07 ENCOUNTER — Ambulatory Visit: Payer: Self-pay | Admitting: Internal Medicine

## 2018-10-22 MED FILL — TRULICITY 0.75 MG/0.5 ML PE: 0.75 | 28 days supply | Qty: 2 | Fill #4

## 2018-11-02 MED FILL — LISINOPRIL 20 MG TAB: 20 | 30 days supply | Qty: 30 | Fill #4

## 2018-11-02 MED FILL — metFORMIN HCL 1000 MG TABS: 1000 | 30 days supply | Qty: 60 | Fill #2

## 2018-11-02 MED FILL — PRAVASTATIN NA 40 MG TAB: 40 | 30 days supply | Qty: 30 | Fill #2

## 2018-11-02 MED FILL — glipiZIDE 5 MG TABS: 5 | 30 days supply | Qty: 60 | Fill #2

## 2018-11-05 ENCOUNTER — Ambulatory Visit: Payer: Self-pay | Admitting: Internal Medicine

## 2018-11-18 ENCOUNTER — Other Ambulatory Visit: Payer: Self-pay | Admitting: Internal Medicine

## 2018-11-18 DIAGNOSIS — E785 Hyperlipidemia, unspecified: Secondary | ICD-10-CM

## 2018-11-18 DIAGNOSIS — E1165 Type 2 diabetes mellitus with hyperglycemia: Principal | ICD-10-CM

## 2018-11-18 DIAGNOSIS — IMO0001 Reserved for inherently not codable concepts without codable children: Secondary | ICD-10-CM

## 2018-11-18 DIAGNOSIS — E119 Type 2 diabetes mellitus without complications: Secondary | ICD-10-CM

## 2018-11-18 MED FILL — TRULICITY 0.75 MG/0.5 ML PE: 0.75 | 28 days supply | Qty: 2 | Fill #5

## 2018-11-18 MED FILL — LISINOPRIL 20 MG TAB: 20 | 30 days supply | Qty: 30 | Fill #5

## 2018-11-22 MED FILL — metFORMIN HCL 1000 MG TABS: 1000 | 30 days supply | Qty: 60 | Fill #0

## 2018-11-22 MED FILL — PRAVASTATIN NA 40 MG TAB: 40 | 30 days supply | Qty: 30 | Fill #0

## 2018-11-23 MED FILL — glipiZIDE 5 MG TABS: 5 | 30 days supply | Qty: 60 | Fill #0

## 2019-01-03 MED FILL — LISINOPRIL 20 MG TAB: 20 | 30 days supply | Qty: 30 | Fill #6

## 2019-01-03 MED FILL — metFORMIN HCL 1000 MG TABS: 1000 | 30 days supply | Qty: 60 | Fill #1

## 2019-01-03 MED FILL — PRAVASTATIN NA 40 MG TAB: 40 | 30 days supply | Qty: 30 | Fill #1

## 2019-01-03 MED FILL — glipiZIDE 5 MG TABS: 5 | 30 days supply | Qty: 60 | Fill #1

## 2019-01-04 ENCOUNTER — Ambulatory Visit: Payer: Self-pay | Admitting: Internal Medicine

## 2019-01-07 ENCOUNTER — Other Ambulatory Visit: Payer: Self-pay | Admitting: Internal Medicine

## 2019-01-07 DIAGNOSIS — IMO0001 Reserved for inherently not codable concepts without codable children: Secondary | ICD-10-CM

## 2019-01-07 MED FILL — TRULICITY 0.75 MG/0.5 ML PE: 0.75 | 28 days supply | Qty: 2 | Fill #0

## 2019-01-14 ENCOUNTER — Other Ambulatory Visit: Payer: Self-pay

## 2019-01-14 ENCOUNTER — Encounter: Payer: Self-pay | Admitting: Internal Medicine

## 2019-01-14 ENCOUNTER — Ambulatory Visit: Payer: Self-pay | Attending: Internal Medicine | Admitting: Internal Medicine

## 2019-01-14 VITALS — BP 134/82 | Wt 212.0 lb

## 2019-01-14 DIAGNOSIS — I10 Essential (primary) hypertension: Secondary | ICD-10-CM

## 2019-01-14 DIAGNOSIS — E119 Type 2 diabetes mellitus without complications: Secondary | ICD-10-CM

## 2019-01-14 DIAGNOSIS — E669 Obesity, unspecified: Secondary | ICD-10-CM

## 2019-01-14 DIAGNOSIS — E785 Hyperlipidemia, unspecified: Secondary | ICD-10-CM

## 2019-01-14 MED ORDER — METFORMIN HCL 1000 MG PO TABS: 1000.0000 mg | ORAL_TABLET | Freq: Two times a day (BID) | ORAL | 1 refills | Status: DC

## 2019-01-14 MED ORDER — LISINOPRIL 20 MG PO TABS: 20.0000 mg | ORAL_TABLET | Freq: Every day | ORAL | 3 refills | Status: DC

## 2019-01-14 MED ORDER — GLIPIZIDE 5 MG PO TABS: ORAL_TABLET | ORAL | 1 refills | Status: DC

## 2019-01-14 MED ORDER — PRAVASTATIN SODIUM 40 MG PO TABS: 40.0000 mg | ORAL_TABLET | Freq: Every day | ORAL | 1 refills | Status: DC

## 2019-01-14 MED ORDER — DULAGLUTIDE 0.75 MG/0.5ML ~~LOC~~ SOAJ: 0.7500 mg | SUBCUTANEOUS | 6 refills | Status: DC

## 2019-01-14 NOTE — Progress Notes (Signed)
Virtual Visit via Telephone Note Due to current restrictions/limitations of in-office visits due to the COVID-19 pandemic, this scheduled clinical appointment was converted to a telehealth visit  I connected with Johnny Nichols on 01/14/19 at 12:11 p.m EDT by telephone and verified that I am speaking with the correct person using two identifiers. I am in my office.  The patient is at home.  Only the patient and myself participated in this encounter.  I discussed the limitations, risks, security and privacy concerns of performing an evaluation and management service by telephone and the availability of in person appointments. I also discussed with the patient that there may be a patient responsible charge related to this service. The patient expressed understanding and agreed to proceed.   History of Present Illness: DM, HTN, OSA, HL.  Last seen 07/2018.  DM: checking BS 1-2 x a day.  Range 2 hrs after BF is 90-128.  Mid evening range 130-150.  Blood sugar today was 123. Med:  Trulicity added on last visit.  Out of it for 3 wks.  Just received yesterday.  Eating habits:  Reports dec appetite with Trulicity.  Admits that he binge eats at times since COVID  Exercise:  Was going to gym regularly before COVID and was down to 208 lbs but now at 212 lbs.  Loss 21 lbs since last visit with me.  He wants to get down to 170.  He has not been going out much to exercise since COVID-19 Did not get eye exam done as yet Given Cinnamon capsules by sister who is nutritionist.  Wants to know if it will help lower A1C  HTN:  Checks BP 1-2 x a wk.  BP today was 134/82.  Other readings 124/84, 133/87, 137/88 Limits salt in foods No CP/SOB/LE  HL:  Tolerating Pravachol  HM:  Mailed the fit test in about 3 weeks ago.  We have not received results as yet.  Observations/Objective: Vitals:   01/14/19 1151  BP: 134/82     Assessment and Plan: 1. Controlled type 2 diabetes mellitus without complication,  without long-term current use of insulin (HCC) Commended him on weight loss and compliance with medications. Patient advised that he can still exercise during this COVID pandemic as long as he maintains social distancing.  He should try to walk at least 3 to 4 days a week for 30 minutes. - CBC; Future - Lipid panel; Future - Comprehensive metabolic panel; Future - Hemoglobin A1c; Future - glipiZIDE (GLUCOTROL) 5 MG tablet; TAKE 1 TABLET BY MOUTH 2 (TWO) TIMES DAILY BEFORE A MEAL.  Dispense: 180 tablet; Refill: 1 - metFORMIN (GLUCOPHAGE) 1000 MG tablet; Take 1 tablet (1,000 mg total) by mouth 2 (two) times daily with a meal.  Dispense: 180 tablet; Refill: 1 - Dulaglutide (TRULICITY) 0.75 MG/0.5ML SOPN; Inject 0.75 mg into the skin once a week.  Dispense: 2 mL; Refill: 6  2. Essential hypertension Reported home blood pressure readings are not at goal of 130/80 or lower.  Continue low-salt diet.  I recommended adding a low dose of amlodipine but patient declined.  He feels that his blood pressure will come down more once he starts exercising again.  He will continue to monitor - lisinopril (ZESTRIL) 20 MG tablet; Take 1 tablet (20 mg total) by mouth daily.  Dispense: 90 tablet; Refill: 3  3. Dyslipidemia - pravastatin (PRAVACHOL) 40 MG tablet; Take 1 tablet (40 mg total) by mouth daily.  Dispense: 90 tablet; Refill: 1  4. Obesity (  BMI 30-39.9) See #1 above.  I will ask the lab tech to check with [facility] to see if they have the results of his fit test Follow Up Instructions: F/u in 3 mths   I discussed the assessment and treatment plan with the patient. The patient was provided an opportunity to ask questions and all were answered. The patient agreed with the plan and demonstrated an understanding of the instructions.   The patient was advised to call back or seek an in-person evaluation if the symptoms worsen or if the condition fails to improve as anticipated.  I provided 19 minutes of  non-face-to-face time during this encounter.   Jonah Blueeborah , MD

## 2019-01-14 NOTE — Progress Notes (Signed)
Patients called.  Patient identified by name and date of birth.  Patient here for follow up for diabetes.  Patients CBG this morning is 123.  Patient has been out of his Trulicity for three weeks.  Got resupply yesterday.  Patient inquiring about when to take if.

## 2019-01-26 LAB — FECAL OCCULT BLOOD, IMMUNOCHEMICAL

## 2019-02-03 ENCOUNTER — Other Ambulatory Visit: Payer: Self-pay

## 2019-02-03 ENCOUNTER — Ambulatory Visit: Payer: Self-pay | Attending: Internal Medicine

## 2019-02-03 DIAGNOSIS — Z1211 Encounter for screening for malignant neoplasm of colon: Secondary | ICD-10-CM

## 2019-02-03 DIAGNOSIS — E119 Type 2 diabetes mellitus without complications: Secondary | ICD-10-CM

## 2019-02-03 MED FILL — metFORMIN HCL 1000 MG TABS: 1000 | 30 days supply | Qty: 60 | Fill #2

## 2019-02-03 MED FILL — LISINOPRIL 20 MG TABLET: 20 | 30 days supply | Qty: 30 | Fill #7

## 2019-02-03 MED FILL — ?GLIPIZIDE 5MG TABLET: 5 | 30 days supply | Qty: 60 | Fill #2

## 2019-02-03 MED FILL — TRULICITY 0.75 MG/0.5 ML PE: 0.75 | 28 days supply | Qty: 2 | Fill #1

## 2019-02-03 MED FILL — ?PRAVASTATIN NA 40 MG TAB: 40 | 30 days supply | Qty: 30 | Fill #2

## 2019-02-04 ENCOUNTER — Telehealth: Payer: Self-pay

## 2019-02-04 LAB — LIPID PANEL
Chol/HDL Ratio: 5.2 ratio — ABNORMAL HIGH (ref 0.0–5.0)
Cholesterol, Total: 222 mg/dL — ABNORMAL HIGH (ref 100–199)
HDL: 43 mg/dL (ref >39)
LDL Calculated: 152 mg/dL — ABNORMAL HIGH (ref 0–99)
Triglycerides: 134 mg/dL (ref 0–149)
VLDL Cholesterol Cal: 27 mg/dL (ref 5–40)

## 2019-02-04 LAB — HEMOGLOBIN A1C
Est. average glucose Bld gHb Est-mCnc: 137 mg/dL
Hgb A1c MFr Bld: 6.4 % — ABNORMAL HIGH (ref 4.8–5.6)

## 2019-02-04 LAB — COMPREHENSIVE METABOLIC PANEL
ALT: 22 IU/L (ref 0–44)
AST: 15 IU/L (ref 0–40)
Albumin/Globulin Ratio: 1.8 (ref 1.2–2.2)
Albumin: 4.6 g/dL (ref 3.8–4.9)
Alkaline Phosphatase: 57 IU/L (ref 39–117)
BUN/Creatinine Ratio: 15 (ref 9–20)
BUN: 16 mg/dL (ref 6–24)
Bilirubin Total: 0.2 mg/dL (ref 0.0–1.2)
CO2: 21 mmol/L (ref 20–29)
Calcium: 10.2 mg/dL (ref 8.7–10.2)
Chloride: 105 mmol/L (ref 96–106)
Creatinine, Ser: 1.04 mg/dL (ref 0.76–1.27)
GFR calc Af Amer: 95 mL/min/{1.73_m2} (ref >59)
GFR calc non Af Amer: 82 mL/min/{1.73_m2} (ref >59)
Globulin, Total: 2.6 g/dL (ref 1.5–4.5)
Glucose: 108 mg/dL — ABNORMAL HIGH (ref 65–99)
Potassium: 4.4 mmol/L (ref 3.5–5.2)
Sodium: 140 mmol/L (ref 134–144)
Total Protein: 7.2 g/dL (ref 6.0–8.5)

## 2019-02-04 LAB — CBC
Hematocrit: 45.8 % (ref 37.5–51.0)
Hemoglobin: 15.1 g/dL (ref 13.0–17.7)
MCH: 28.8 pg (ref 26.6–33.0)
MCHC: 33 g/dL (ref 31.5–35.7)
MCV: 87 fL (ref 79–97)
Platelets: 247 10*3/uL (ref 150–450)
RBC: 5.25 x10E6/uL (ref 4.14–5.80)
RDW: 12.8 % (ref 11.6–15.4)
WBC: 7.6 10*3/uL (ref 3.4–10.8)

## 2019-02-04 NOTE — Telephone Encounter (Signed)
Contacted pt to go over lab results pt is aware and doesn't have any questions or concerns   Pt states he is taking the pravastatin everyday

## 2019-03-10 MED FILL — LISINOPRIL 20 MG TABLET: 20 | 30 days supply | Qty: 30 | Fill #8

## 2019-03-10 MED FILL — TRULICITY 0.75 MG/0.5 ML PE: 0.75 | 28 days supply | Qty: 2 | Fill #2

## 2019-03-10 MED FILL — ?GLIPIZIDE 5MG TABLET: 5 | 30 days supply | Qty: 60 | Fill #0

## 2019-03-10 MED FILL — ?PRAVASTATIN NA 40 MG TAB: 40 | 90 days supply | Qty: 90 | Fill #0

## 2019-03-10 MED FILL — metFORMIN HCL 1000 MG TABS: 1000 | 30 days supply | Qty: 60 | Fill #0

## 2019-04-13 MED FILL — LISINOPRIL 20 MG TABLET: 20 | 30 days supply | Qty: 30 | Fill #9

## 2019-04-13 MED FILL — metFORMIN HCL 1000 MG TABS: 1000 | 30 days supply | Qty: 60 | Fill #1

## 2019-04-13 MED FILL — TRULICITY 0.75 MG/0.5 ML PE: 0.75 | 29 days supply | Qty: 2 | Fill #0

## 2019-04-13 MED FILL — ?GLIPIZIDE 5MG TABLET: 5 | 30 days supply | Qty: 60 | Fill #1

## 2019-05-19 MED FILL — ?GLIPIZIDE 5MG TABLET: 5 | 30 days supply | Qty: 60 | Fill #2

## 2019-05-19 MED FILL — LISINOPRIL 20 MG TABLET: 20 | 30 days supply | Qty: 30 | Fill #10

## 2019-05-19 MED FILL — metFORMIN HCL 1000 MG TABS: 1000 | 30 days supply | Qty: 60 | Fill #2

## 2019-06-21 MED FILL — ?GLIPIZIDE 5MG TABLET: 5 | 30 days supply | Qty: 60 | Fill #3

## 2019-06-21 MED FILL — metFORMIN HCL 1000 MG TABS: 1000 | 30 days supply | Qty: 60 | Fill #3

## 2019-06-21 MED FILL — LISINOPRIL 20 MG TABLET: 20 | 30 days supply | Qty: 30 | Fill #11

## 2019-06-22 MED FILL — ?PRAVASTATIN NA 40 MG TAB: 40 | 90 days supply | Qty: 90 | Fill #1

## 2019-07-21 MED FILL — metFORMIN HCL 1000 MG TABS: 1000 | 60 days supply | Qty: 120 | Fill #4

## 2019-07-21 MED FILL — glipiZIDE 5 MG TABS: 5 | 60 days supply | Qty: 120 | Fill #4

## 2019-07-21 MED FILL — LISINOPRIL 20 MG TABLET: 20 | 90 days supply | Qty: 90 | Fill #0

## 2019-09-07 MED FILL — TRULICITY 0.75 MG/0.5 ML PE: 0.75 | 29 days supply | Qty: 2 | Fill #1

## 2019-09-26 ENCOUNTER — Other Ambulatory Visit: Payer: Self-pay | Admitting: Internal Medicine

## 2019-09-26 DIAGNOSIS — E785 Hyperlipidemia, unspecified: Secondary | ICD-10-CM

## 2019-09-26 DIAGNOSIS — E119 Type 2 diabetes mellitus without complications: Secondary | ICD-10-CM

## 2019-09-27 ENCOUNTER — Telehealth: Payer: Self-pay | Admitting: Internal Medicine

## 2019-09-27 DIAGNOSIS — E119 Type 2 diabetes mellitus without complications: Secondary | ICD-10-CM

## 2019-09-27 DIAGNOSIS — E785 Hyperlipidemia, unspecified: Secondary | ICD-10-CM

## 2019-09-27 MED ORDER — PRAVASTATIN SODIUM 40 MG PO TABS: 40.0000 mg | ORAL_TABLET | Freq: Every day | ORAL | 0 refills | Status: DC

## 2019-09-27 MED ORDER — METFORMIN HCL 1000 MG PO TABS: 1000.0000 mg | ORAL_TABLET | Freq: Two times a day (BID) | ORAL | 0 refills | Status: DC

## 2019-09-27 MED FILL — metFORMIN HCL 1000 MG TABS: 1000 | 30 days supply | Qty: 60 | Fill #0

## 2019-09-27 MED FILL — ?PRAVASTATIN NA 40 MG TAB: 40 | 30 days supply | Qty: 30 | Fill #0

## 2019-09-27 NOTE — Telephone Encounter (Signed)
Refills sent

## 2019-09-27 NOTE — Telephone Encounter (Signed)
1) Medication(s) Requested (by name): metformin and 2andpravastatin  2) Pharmacy of Choice: [facility] - [place], [place] - [address]  [address], [place] [address]  3) Special Requests:   Approved medications will be sent to the pharmacy, we will reach out if there is an issue.  Requests made after 3pm may not be addressed until the following business day!  If a patient is unsure of the name of the medication(s) please note and ask patient to call back when they are able to provide all info, do not send to responsible party until all information is available!

## 2019-10-04 ENCOUNTER — Ambulatory Visit: Payer: Self-pay | Attending: Internal Medicine | Admitting: Internal Medicine

## 2019-10-04 ENCOUNTER — Other Ambulatory Visit: Payer: Self-pay

## 2019-10-04 ENCOUNTER — Encounter: Payer: Self-pay | Admitting: Internal Medicine

## 2019-10-04 VITALS — BP 132/88 | Wt 232.0 lb

## 2019-10-04 DIAGNOSIS — J302 Other seasonal allergic rhinitis: Secondary | ICD-10-CM

## 2019-10-04 DIAGNOSIS — E1169 Type 2 diabetes mellitus with other specified complication: Secondary | ICD-10-CM

## 2019-10-04 DIAGNOSIS — E785 Hyperlipidemia, unspecified: Secondary | ICD-10-CM

## 2019-10-04 DIAGNOSIS — E669 Obesity, unspecified: Secondary | ICD-10-CM

## 2019-10-04 DIAGNOSIS — E119 Type 2 diabetes mellitus without complications: Secondary | ICD-10-CM

## 2019-10-04 DIAGNOSIS — I10 Essential (primary) hypertension: Secondary | ICD-10-CM

## 2019-10-04 DIAGNOSIS — Z23 Encounter for immunization: Secondary | ICD-10-CM

## 2019-10-04 MED ORDER — AMLODIPINE BESYLATE 5 MG PO TABS: 5.0000 mg | ORAL_TABLET | Freq: Every day | ORAL | 6 refills | Status: DC

## 2019-10-04 MED ORDER — METFORMIN HCL 1000 MG PO TABS: 1000.0000 mg | ORAL_TABLET | Freq: Two times a day (BID) | ORAL | 1 refills | Status: DC

## 2019-10-04 MED ORDER — LISINOPRIL 20 MG PO TABS: 20.0000 mg | ORAL_TABLET | Freq: Every day | ORAL | 3 refills | Status: DC

## 2019-10-04 MED ORDER — GLIPIZIDE 5 MG PO TABS: ORAL_TABLET | ORAL | 1 refills | Status: DC

## 2019-10-04 MED ORDER — PRAVASTATIN SODIUM 40 MG PO TABS: 40.0000 mg | ORAL_TABLET | Freq: Every day | ORAL | 6 refills | Status: DC

## 2019-10-04 MED ORDER — TRULICITY 0.75 MG/0.5ML ~~LOC~~ SOAJ: 0.7500 mg | SUBCUTANEOUS | 6 refills | Status: DC

## 2019-10-04 MED FILL — AMLODIPINE BESYLATE 5 MG TA: 5 | 30 days supply | Qty: 30 | Fill #0

## 2019-10-04 MED FILL — ?GLIPIZIDE 5MG TABLET: 5 | 30 days supply | Qty: 60 | Fill #0

## 2019-10-04 NOTE — Progress Notes (Signed)
Pt states he has some sinus issues going on  Pt states his blood sugar this morning is 128  Pt states he just did his Fecal Kit and sent it off yesterday but he did change the dates

## 2019-10-04 NOTE — Progress Notes (Signed)
Virtual Visit via Telephone Note Due to current restrictions/limitations of in-office visits due to the COVID-19 pandemic, this scheduled clinical appointment was converted to a telehealth visit  I connected with Johnny Nichols on 10/04/19 at 9:19 a.m by telephone and verified that I am speaking with the correct person using two identifiers. I am in my office.  The patient is at home.  Only the patient and myself participated in this encounter.  I discussed the limitations, risks, security and privacy concerns of performing an evaluation and management service by telephone and the availability of in person appointments. I also discussed with the patient that there may be a patient responsible charge related to this service. The patient expressed understanding and agreed to proceed.   History of Present Illness: DM, HTN, OSA, HL.  Last eval 12/2018.  Purpose of today's visit is chronic disease management.  C/o issues with sinuses.  Thinks he has allergies:  Symptoms include nasal congestion, itchy eyes with some lacrimation x 2-3 days.  No fever.  Taking Benadryl and another OTC med which helps.  Also takes Excedrine.  HYPERTENSION Currently taking: see medication list Med Adherence: '[x]'  Yes -Lisinopril   '[]'  No Medication side effects: '[]'  Yes    '[x]'  No Adherence with salt restriction: "trying to.  I got lazy and was eating a lot of junk foods." Home Monitoring?: '[x]'  Yes    '[]'  No Monitoring Frequency:  Once a wk at most Home BP results range:  This a.m is 132/88.  BP readings on last visit were also not at goal SOB? '[]'  Yes    '[x]'  No Chest Pain?: '[]'  Yes    '[x]'  No Leg swelling?: '[]'  Yes    '[x]'  No Headaches?: '[]'  Yes    '[x]'  No Dizziness? '[]'  Yes    '[x]'  No Comments:   DM:  Gained 20 lbs (now 232 lbs) since he was not able to get to gym on regular bases during pamdemic.  Gym just reopened.  Yesterday was 1 st day back at gym.  Plans to go 3-5 times a wk -Eating habits:  Not the best over the  past 6 mths.  Was snacking a lot.  Now trying to eat low calorie meals with adequate protein. -Checking BS 1-2 times/day.  Checks 1.5 hr after BF - 135, 131,135,144, 125, 121.  Last wk when he was out of Metformin BS were in the 160s.  At bedtime range 110-160s. -Was out of Metformin for 4 days last wk.  Compliant with Metformin, Trulicity -checks feet regularly.  Big toe on Lt foot use to have numbness but not any more -did not get eye exam as yet due to [place] pandemic.  He plans to get it done.  Near vision is a little worse  HL:  Taking Pravachol consistently but takes in the mornings.  No muscle aches or pains Lipid profile when checked after last visit was not at goal.  Outpatient Encounter Medications as of 10/04/2019  Medication Sig  . Blood Glucose Monitoring Suppl (TRUE METRIX METER) w/Device KIT Use to check blood sugar as instructed.  . Dulaglutide (TRULICITY) 6.14 ER/1.5QM SOPN Inject 0.75 mg into the skin once a week.  Marland Kitchen glipiZIDE (GLUCOTROL) 5 MG tablet TAKE 1 TABLET BY MOUTH 2 (TWO) TIMES DAILY BEFORE A MEAL.  Marland Kitchen glucose blood (TRUE METRIX BLOOD GLUCOSE TEST) test strip Use as instructed to check blood sugar daily.  Marland Kitchen lisinopril (ZESTRIL) 20 MG tablet Take 1 tablet (20 mg total) by mouth  daily.  . metFORMIN (GLUCOPHAGE) 1000 MG tablet Take 1 tablet (1,000 mg total) by mouth 2 (two) times daily with a meal.  . pravastatin (PRAVACHOL) 40 MG tablet Take 1 tablet (40 mg total) by mouth daily.  . TRUEPLUS LANCETS 28G MISC Use as instructed to check blood sugar daily.   No facility-administered encounter medications on file as of 10/04/2019.      Observations/Objective:  Results for orders placed or performed in visit on 02/03/19  Hemoglobin A1c  Result Value Ref Range   Hgb A1c MFr Bld 6.4 (H) 4.8 - 5.6 %   Est. average glucose Bld gHb Est-mCnc 137 mg/dL  Comprehensive metabolic panel  Result Value Ref Range   Glucose 108 (H) 65 - 99 mg/dL   BUN 16 6 - 24 mg/dL   Creatinine,  Ser 1.04 0.76 - 1.27 mg/dL   GFR calc non Af Amer 82 >59 mL/min/1.73   GFR calc Af Amer 95 >59 mL/min/1.73   BUN/Creatinine Ratio 15 9 - 20   Sodium 140 134 - 144 mmol/L   Potassium 4.4 3.5 - 5.2 mmol/L   Chloride 105 96 - 106 mmol/L   CO2 21 20 - 29 mmol/L   Calcium 10.2 8.7 - 10.2 mg/dL   Total Protein 7.2 6.0 - 8.5 g/dL   Albumin 4.6 3.8 - 4.9 g/dL   Globulin, Total 2.6 1.5 - 4.5 g/dL   Albumin/Globulin Ratio 1.8 1.2 - 2.2   Bilirubin Total 0.2 0.0 - 1.2 mg/dL   Alkaline Phosphatase 57 39 - 117 IU/L   AST 15 0 - 40 IU/L   ALT 22 0 - 44 IU/L  Lipid panel  Result Value Ref Range   Cholesterol, Total 222 (H) 100 - 199 mg/dL   Triglycerides 134 0 - 149 mg/dL   HDL 43 >39 mg/dL   VLDL Cholesterol Cal 27 5 - 40 mg/dL   LDL Calculated 152 (H) 0 - 99 mg/dL   Chol/HDL Ratio 5.2 (H) 0.0 - 5.0 ratio  CBC  Result Value Ref Range   WBC 7.6 3.4 - 10.8 x10E3/uL   RBC 5.25 4.14 - 5.80 x10E6/uL   Hemoglobin 15.1 13.0 - 17.7 g/dL   Hematocrit 45.8 37.5 - 51.0 %   MCV 87 79 - 97 fL   MCH 28.8 26.6 - 33.0 pg   MCHC 33.0 31.5 - 35.7 g/dL   RDW 12.8 11.6 - 15.4 %   Platelets 247 150 - 450 x10E3/uL     Assessment and Plan: 1. Controlled type 2 diabetes mellitus without complication, without long-term current use of insulin (HCC) Blood sugar readings are at goal. Continue Metformin, glipizide and Trulicity. He will come to the lab sometime over the next 1 to 2 weeks for blood tests including an A1c Dietary counseling given.  He is working on improving his eating habits and trying not to snack as much.  Commended him on getting back to the gym Advised to schedule eye exam - Dulaglutide (TRULICITY) 1.01 BP/1.0CH SOPN; Inject 0.75 mg into the skin once a week.  Dispense: 4 pen; Refill: 6 - glipiZIDE (GLUCOTROL) 5 MG tablet; TAKE 1 TABLET BY MOUTH 2 (TWO) TIMES DAILY BEFORE A MEAL.  Dispense: 180 tablet; Refill: 1 - metFORMIN (GLUCOPHAGE) 1000 MG tablet; Take 1 tablet (1,000 mg total) by mouth  2 (two) times daily with a meal.  Dispense: 180 tablet; Refill: 1 - Hemoglobin A1c; Future  2. Obesity (BMI 30.0-34.9) See #1 above  3. Essential hypertension Not at  goal. Encourage low-salt diet. Continue lisinopril.  Add Norvasc 5 mg daily. - lisinopril (ZESTRIL) 20 MG tablet; Take 1 tablet (20 mg total) by mouth daily.  Dispense: 90 tablet; Refill: 3 - amLODipine (NORVASC) 5 MG tablet; Take 1 tablet (5 mg total) by mouth daily.  Dispense: 30 tablet; Refill: 6  4. Hyperlipidemia associated with type 2 diabetes mellitus ([place]) Recheck lipid profile to see where the it is improved.  If it does not we will change him to Lipitor.  Healthy eating habits encouraged - pravastatin (PRAVACHOL) 40 MG tablet; Take 1 tablet (40 mg total) by mouth daily.  Dispense: 30 tablet; Refill: 6 - Lipid panel; Future  5. Seasonal allergic rhinitis, unspecified trigger I recommend Claritin and Flonase both of which can be purchased over-the-counter.  However patient states he is doing okay on the current over-the-counter medicines that he is using  6. Need for immunization against influenza CMA to schedule an appointment for him to come in to have vaccine administered   Follow Up Instructions: 4 mths   I discussed the assessment and treatment plan with the patient. The patient was provided an opportunity to ask questions and all were answered. The patient agreed with the plan and demonstrated an understanding of the instructions.   The patient was advised to call back or seek an in-person evaluation if the symptoms worsen or if the condition fails to improve as anticipated.  I provided 21 minutes of non-face-to-face time during this encounter.   Karle Plumber, MD

## 2019-10-08 LAB — FECAL OCCULT BLOOD, IMMUNOCHEMICAL: Fecal Occult Bld: NEGATIVE

## 2019-10-11 ENCOUNTER — Ambulatory Visit: Payer: Self-pay | Attending: Internal Medicine | Admitting: Pharmacist

## 2019-10-11 ENCOUNTER — Other Ambulatory Visit: Payer: Self-pay

## 2019-10-11 ENCOUNTER — Telehealth: Payer: Self-pay

## 2019-10-11 DIAGNOSIS — Z23 Encounter for immunization: Secondary | ICD-10-CM

## 2019-10-11 NOTE — Progress Notes (Signed)
Patient presents for vaccination against influenza per orders of Dr. Johnson. Consent given. Counseling provided. No contraindications exists. Vaccine administered without incident.   

## 2019-10-11 NOTE — Telephone Encounter (Signed)
Contacted pt to go over Fit Test pt didn't answer left a detailed vm informing pt of results and if he has any questions or concerns to give me a call

## 2019-10-12 ENCOUNTER — Ambulatory Visit: Payer: Self-pay | Attending: Internal Medicine

## 2019-10-12 DIAGNOSIS — E119 Type 2 diabetes mellitus without complications: Secondary | ICD-10-CM

## 2019-10-12 DIAGNOSIS — E1169 Type 2 diabetes mellitus with other specified complication: Secondary | ICD-10-CM

## 2019-10-13 ENCOUNTER — Other Ambulatory Visit: Payer: Self-pay | Admitting: Internal Medicine

## 2019-10-13 LAB — HEMOGLOBIN A1C
Est. average glucose Bld gHb Est-mCnc: 177 mg/dL
Hgb A1c MFr Bld: 7.8 % — ABNORMAL HIGH (ref 4.8–5.6)

## 2019-10-13 LAB — LIPID PANEL
Chol/HDL Ratio: 5.3 ratio — ABNORMAL HIGH (ref 0.0–5.0)
Cholesterol, Total: 190 mg/dL (ref 100–199)
HDL: 36 mg/dL — ABNORMAL LOW (ref >39)
LDL Chol Calc (NIH): 137 mg/dL — ABNORMAL HIGH (ref 0–99)
Triglycerides: 91 mg/dL (ref 0–149)
VLDL Cholesterol Cal: 17 mg/dL (ref 5–40)

## 2019-10-13 MED ORDER — ATORVASTATIN CALCIUM 20 MG PO TABS: 20.0000 mg | ORAL_TABLET | Freq: Every day | ORAL | 6 refills | Status: DC

## 2019-10-13 MED FILL — ?ATORVASTATIN 20 MG TABLET: 20 | 30 days supply | Qty: 30 | Fill #0

## 2019-10-13 NOTE — Progress Notes (Signed)
Let patient know that his A1c is 7.8 with goal being less than 7.  In June of last year it was 6.4.  We can either increase the Trulicity or he can work on increasing his physical activity as discussed on recent visit.  Let me know if he wants [place] to increase the Trulicity.  LDL cholesterol is 137 with goal being less than 70.  I recommend changing Pravachol to 1 called Lipitor that has a better cholesterol lowering effect.  I will send that prescription to our pharmacy.

## 2019-10-18 ENCOUNTER — Telehealth: Payer: Self-pay

## 2019-10-18 DIAGNOSIS — E119 Type 2 diabetes mellitus without complications: Secondary | ICD-10-CM

## 2019-10-18 MED ORDER — TRULICITY 1.5 MG/0.5ML ~~LOC~~ SOAJ: 1.5000 mg | SUBCUTANEOUS | 4 refills | Status: DC

## 2019-10-18 NOTE — Telephone Encounter (Signed)
Contacted pt to go over lab results. Pt states he would like a temporary increase in his trulicity but he will also increase his physical aciticyt. Pt states he went to the gym yesterday for the first time since June of last year

## 2019-10-19 NOTE — Telephone Encounter (Signed)
Contacted pt to go over Dr. Laural Benes response pt didn't answer left a detailed vm informing pt of response and if he has any questions or concerns to give me a call

## 2019-10-26 MED FILL — PRAVASTATIN NA 40 MG TAB: 40 | 30 days supply | Qty: 30 | Fill #0

## 2019-10-26 MED FILL — LISINOPRIL 20 MG TABLET: 20 | 30 days supply | Qty: 30 | Fill #0

## 2019-10-26 MED FILL — metFORMIN HCL 1000 MG TABS: 1000 | 30 days supply | Qty: 60 | Fill #0

## 2019-11-09 MED FILL — ?GLIPIZIDE 5MG TABLET: 5 | 30 days supply | Qty: 60 | Fill #1

## 2019-11-09 MED FILL — AMLODIPINE BESYLATE 5 MG TA: 5 | 30 days supply | Qty: 30 | Fill #1

## 2019-11-09 MED FILL — ATORVASTATIN CALCIUM 20 MG: 20 | 30 days supply | Qty: 30 | Fill #1

## 2019-11-30 MED FILL — metFORMIN HCL 1000 MG TABS: 1000 | 30 days supply | Qty: 60 | Fill #1

## 2019-11-30 MED FILL — LISINOPRIL 20 MG TABLET: 20 | 30 days supply | Qty: 30 | Fill #1

## 2019-12-05 MED FILL — ?GLIPIZIDE 5MG TABLET: 5 | 30 days supply | Qty: 60 | Fill #2

## 2019-12-05 MED FILL — ?ATORVASTATIN 20 MG TABLET: 20 | 30 days supply | Qty: 30 | Fill #2

## 2019-12-05 MED FILL — AMLODIPINE BESYLATE 5 MG TA: 5 | 30 days supply | Qty: 30 | Fill #2

## 2020-02-13 ENCOUNTER — Telehealth: Payer: Self-pay

## 2020-02-13 ENCOUNTER — Ambulatory Visit: Payer: Self-pay | Admitting: Internal Medicine

## 2020-02-13 MED FILL — ?AMLODIPINE BESYLATE 5MG TA: 5 | 30 days supply | Qty: 30 | Fill #4

## 2020-02-13 MED FILL — metFORMIN HCL 1000 MG TABS: 1000 | 30 days supply | Qty: 60 | Fill #3

## 2020-02-13 MED FILL — ?GLIPIZIDE 5MG TABLET: 5 | 30 days supply | Qty: 60 | Fill #4

## 2020-02-13 MED FILL — LISINOPRIL 20 MG TABLET: 20 | 30 days supply | Qty: 30 | Fill #3

## 2020-02-13 MED FILL — ATORVASTATIN CALCIUM 20 MG: 20 | 30 days supply | Qty: 30 | Fill #4

## 2020-02-13 NOTE — Telephone Encounter (Signed)
These were refilled and picked up at our pharmacy today.  °

## 2020-02-13 NOTE — Telephone Encounter (Signed)
1) Medication(s) Requested (by name): amlodipine, lisinopril, glucophage, glipizide, & pt believes there is a fifth medication on file he needs   2) Pharmacy of Choice: [facility]  3) Special Requests: Pt arrived late for appt today 614/21. Please refill meds until new appt 02/11/20.   Approved medications will be sent to the pharmacy, we will reach out if there is an issue.  Requests made after 3pm may not be addressed until the following business day!  If a patient is unsure of the name of the medication(s) please note and ask patient to call back when they are able to provide all info, do not send to responsible party until all information is available!

## 2020-03-12 ENCOUNTER — Ambulatory Visit: Payer: Self-pay | Admitting: Internal Medicine

## 2020-03-15 MED FILL — LISINOPRIL 20 MG TABLET: 20 | 30 days supply | Qty: 30 | Fill #4

## 2020-03-15 MED FILL — ?GLIPIZIDE 5MG TABLET: 5 | 30 days supply | Qty: 60 | Fill #5

## 2020-03-15 MED FILL — ?AMLODIPINE BESYLATE 5MG TA: 5 | 30 days supply | Qty: 30 | Fill #5

## 2020-03-15 MED FILL — metFORMIN HCL 1000 MG TABS: 1000 | 30 days supply | Qty: 60 | Fill #4

## 2020-03-15 MED FILL — ?ATORVASTATIN 20 MG TABLET: 20 | 30 days supply | Qty: 30 | Fill #5

## 2020-04-17 ENCOUNTER — Other Ambulatory Visit: Payer: Self-pay | Admitting: Internal Medicine

## 2020-04-17 DIAGNOSIS — E119 Type 2 diabetes mellitus without complications: Secondary | ICD-10-CM

## 2020-04-17 MED FILL — LISINOPRIL 20 MG TABLET: 20 | 30 days supply | Qty: 30 | Fill #5

## 2020-04-17 MED FILL — AMLODIPINE BESYLATE 5 MG TA: 5 | 30 days supply | Qty: 30 | Fill #6

## 2020-04-17 MED FILL — ATORVASTATIN CALCIUM 20 MG: 20 | 30 days supply | Qty: 30 | Fill #6

## 2020-04-17 MED FILL — metFORMIN HCL 1000 MG TABS: 1000 | 30 days supply | Qty: 60 | Fill #5

## 2020-04-17 NOTE — Telephone Encounter (Signed)
Requested medication (s) are due for refill today: yes  Requested medication (s) are on the active medication list: yes  Last refill:  03/15/2020  Future visit scheduled: no  Notes to clinic:  Patient has no showed last few appointments Review for refill   Requested Prescriptions  Pending Prescriptions Disp Refills   glipiZIDE (GLUCOTROL) 5 MG tablet [Pharmacy Med Name: GLIPIZIDE 5MG  TABLET 5 Tablet] 60 tablet 1    Sig: TAKE 1 TABLET BY MOUTH 2 (TWO) TIMES DAILY BEFORE A MEAL.      Endocrinology:  Diabetes - Sulfonylureas Failed - 04/17/2020  2:45 PM      Failed - HBA1C is between 0 and 7.9 and within 180 days    HbA1c, POC (controlled diabetic range)  Date Value Ref Range Status  07/02/2018 9.6 (A) 0.0 - 7.0 % Final   Hgb A1c MFr Bld  Date Value Ref Range Status  10/12/2019 7.8 (H) 4.8 - 5.6 % Final    Comment:             Prediabetes: 5.7 - 6.4          Diabetes: >6.4          Glycemic control for adults with diabetes: <7.0           Failed - Valid encounter within last 6 months    Recent Outpatient Visits           6 months ago Controlled type 2 diabetes mellitus without complication, without long-term current use of insulin (HCC)   [facility] [facility] 12/10/2019, MD   1 year ago Essential hypertension   [place] [facility] Marcine Matar B, MD   1 year ago Uncontrolled type 2 diabetes mellitus without complication, without long-term current use of insulin [facility])   [place] [facility] B, MD   2 years ago Diabetes mellitus type 2, uncontrolled, without complications [facility])   [place] [facility], MD   2 years ago Diabetes mellitus without complication [facility])   [place] [facility] [place], [place], MD

## 2020-04-19 ENCOUNTER — Other Ambulatory Visit: Payer: Self-pay | Admitting: Internal Medicine

## 2020-04-19 DIAGNOSIS — E119 Type 2 diabetes mellitus without complications: Secondary | ICD-10-CM

## 2020-04-19 NOTE — Telephone Encounter (Signed)
appt 05/14/20. Please send short supply until appt.

## 2020-04-19 NOTE — Telephone Encounter (Signed)
Requested Prescriptions  Pending Prescriptions Disp Refills   glipiZIDE (GLUCOTROL) 5 MG tablet [Pharmacy Med Name: GLIPIZIDE 5MG  TABLET 5 Tablet] 60 tablet 0    Sig: TAKE 1 TABLET BY MOUTH 2 (TWO) TIMES DAILY BEFORE A MEAL.     Endocrinology:  Diabetes - Sulfonylureas Failed - 04/19/2020  4:59 PM      Failed - HBA1C is between 0 and 7.9 and within 180 days    HbA1c, POC (controlled diabetic range)  Date Value Ref Range Status  07/02/2018 9.6 (A) 0.0 - 7.0 % Final   Hgb A1c MFr Bld  Date Value Ref Range Status  10/12/2019 7.8 (H) 4.8 - 5.6 % Final    Comment:             Prediabetes: 5.7 - 6.4          Diabetes: >6.4          Glycemic control for adults with diabetes: <7.0          Failed - Valid encounter within last 6 months    Recent Outpatient Visits          6 months ago Controlled type 2 diabetes mellitus without complication, without long-term current use of insulin (HCC)   [place] [facility] 12/10/2019, MD   1 year ago Essential hypertension   [place] [facility] Marcine Matar B, MD   1 year ago Uncontrolled type 2 diabetes mellitus without complication, without long-term current use of insulin (HCC)   [place] [facility] B, MD   2 years ago Diabetes mellitus type 2, uncontrolled, without complications [facility])   [place] [facility], MD   2 years ago Diabetes mellitus without complication [facility])    [facility], MD      Future Appointments            In 3 weeks Quentin Angst, NP [facility]           courtesy refill given Patient has scheduled appointment 05/14/20 with 05/16/20 NP

## 2020-04-20 MED FILL — ?GLIPIZIDE 5MG TABLET: 5 | 30 days supply | Qty: 60 | Fill #0

## 2020-05-14 ENCOUNTER — Other Ambulatory Visit: Payer: Self-pay | Admitting: Internal Medicine

## 2020-05-14 ENCOUNTER — Encounter: Payer: Self-pay | Admitting: Family

## 2020-05-14 ENCOUNTER — Other Ambulatory Visit: Payer: Self-pay | Admitting: Family

## 2020-05-14 ENCOUNTER — Other Ambulatory Visit: Payer: Self-pay

## 2020-05-14 ENCOUNTER — Ambulatory Visit: Payer: Self-pay | Attending: Family | Admitting: Family

## 2020-05-14 VITALS — BP 137/84 | HR 85 | Temp 97.9°F | Resp 18 | Ht 69.0 in | Wt 253.2 lb

## 2020-05-14 DIAGNOSIS — I10 Essential (primary) hypertension: Secondary | ICD-10-CM

## 2020-05-14 DIAGNOSIS — N529 Male erectile dysfunction, unspecified: Secondary | ICD-10-CM

## 2020-05-14 DIAGNOSIS — E1169 Type 2 diabetes mellitus with other specified complication: Secondary | ICD-10-CM

## 2020-05-14 DIAGNOSIS — E669 Obesity, unspecified: Secondary | ICD-10-CM

## 2020-05-14 DIAGNOSIS — E119 Type 2 diabetes mellitus without complications: Secondary | ICD-10-CM

## 2020-05-14 DIAGNOSIS — E785 Hyperlipidemia, unspecified: Secondary | ICD-10-CM

## 2020-05-14 LAB — GLUCOSE, POCT (MANUAL RESULT ENTRY): POC Glucose: 175 mg/dl — AB (ref 70–99)

## 2020-05-14 LAB — POCT GLYCOSYLATED HEMOGLOBIN (HGB A1C): Hemoglobin A1C: 6.5 % — AB (ref 4.0–5.6)

## 2020-05-14 MED ORDER — TRULICITY 1.5 MG/0.5ML ~~LOC~~ SOAJ: 1.5000 mg | SUBCUTANEOUS | 2 refills | Status: DC

## 2020-05-14 MED ORDER — GLIPIZIDE 5 MG PO TABS: ORAL_TABLET | ORAL | 2 refills | Status: DC

## 2020-05-14 MED ORDER — SILDENAFIL CITRATE 100 MG PO TABS: 50.0000 mg | ORAL_TABLET | Freq: Every day | ORAL | 2 refills | Status: DC | PRN

## 2020-05-14 MED ORDER — AMLODIPINE BESYLATE 5 MG PO TABS: 5.0000 mg | ORAL_TABLET | Freq: Every day | ORAL | 2 refills | Status: DC

## 2020-05-14 MED ORDER — METFORMIN HCL 1000 MG PO TABS: 1000.0000 mg | ORAL_TABLET | Freq: Two times a day (BID) | ORAL | 0 refills | Status: DC

## 2020-05-14 MED ORDER — TRUE METRIX METER W/DEVICE KIT: PACK | 0 refills | Status: DC

## 2020-05-14 MED ORDER — LISINOPRIL 20 MG PO TABS: 20.0000 mg | ORAL_TABLET | Freq: Every day | ORAL | 0 refills | Status: DC

## 2020-05-14 MED ORDER — ATORVASTATIN CALCIUM 20 MG PO TABS: 20.0000 mg | ORAL_TABLET | Freq: Every day | ORAL | 2 refills | Status: DC

## 2020-05-14 MED FILL — SILDENAFIL CITRATE 100 MG T: 100 | 15 days supply | Qty: 5 | Fill #0

## 2020-05-14 MED FILL — TRULICITY 1.5 MG/0.5 ML PEN: 1.5 | 28 days supply | Qty: 2 | Fill #0

## 2020-05-14 MED FILL — AMLODIPINE BESYLATE 5 MG TA: 5 | 30 days supply | Qty: 30 | Fill #0

## 2020-05-14 MED FILL — ?ATORVASTATIN 20 MG TABLET: 20 | 30 days supply | Qty: 30 | Fill #0

## 2020-05-14 MED FILL — !TRUE METRIX BLOOD GLUCOSE: 1 days supply | Qty: 1 | Fill #0

## 2020-05-14 MED FILL — LISINOPRIL 20 MG TABLET: 20 | 30 days supply | Qty: 30 | Fill #6

## 2020-05-14 MED FILL — ?GLIPIZIDE 5MG TABLET: 5 | 30 days supply | Qty: 60 | Fill #0

## 2020-05-14 MED FILL — metFORMIN HCL 1000 MG TABS: 1000 | 30 days supply | Qty: 60 | Fill #0

## 2020-05-14 NOTE — Patient Instructions (Addendum)
Continue Metformin, Glipizide, and Trulicity for diabetes. Continue Lisinopril and Amlodipine for high blood pressure. Continue Atorvastatin for high cholesterol. Viagra for erectile dysfunction. Lab today. Follow-up with primary physician in 3 months or sooner if needed. Diabetes Mellitus and Foot Care Foot care is an important part of your health, especially when you have diabetes. Diabetes may cause you to have problems because of poor blood flow (circulation) to your feet and legs, which can cause your skin to:  Become thinner and drier.  Break more easily.  Heal more slowly.  Peel and crack. You may also have nerve damage (neuropathy) in your legs and feet, causing decreased feeling in them. This means that you may not notice minor injuries to your feet that could lead to more serious problems. Noticing and addressing any potential problems early is the best way to prevent future foot problems. How to care for your feet Foot hygiene  Wash your feet daily with warm water and mild soap. Do not use hot water. Then, pat your feet and the areas between your toes until they are completely dry. Do not soak your feet as this can dry your skin.  Trim your toenails straight across. Do not dig under them or around the cuticle. File the edges of your nails with an emery board or nail file.  Apply a moisturizing lotion or petroleum jelly to the skin on your feet and to dry, brittle toenails. Use lotion that does not contain alcohol and is unscented. Do not apply lotion between your toes. Shoes and socks  Wear clean socks or stockings every day. Make sure they are not too tight. Do not wear knee-high stockings since they may decrease blood flow to your legs.  Wear shoes that fit properly and have enough cushioning. Always look in your shoes before you put them on to be sure there are no objects inside.  To break in new shoes, wear them for just a few hours a day. This prevents injuries on your  feet. Wounds, scrapes, corns, and calluses  Check your feet daily for blisters, cuts, bruises, sores, and redness. If you cannot see the bottom of your feet, use a mirror or ask someone for help.  Do not cut corns or calluses or try to remove them with medicine.  If you find a minor scrape, cut, or break in the skin on your feet, keep it and the skin around it clean and dry. You may clean these areas with mild soap and water. Do not clean the area with peroxide, alcohol, or iodine.  If you have a wound, scrape, corn, or callus on your foot, look at it several times a day to make sure it is healing and not infected. Check for: ? Redness, swelling, or pain. ? Fluid or blood. ? Warmth. ? Pus or a bad smell. General instructions  Do not cross your legs. This may decrease blood flow to your feet.  Do not use heating pads or hot water bottles on your feet. They may burn your skin. If you have lost feeling in your feet or legs, you may not know this is happening until it is too late.  Protect your feet from hot and cold by wearing shoes, such as at the beach or on hot pavement.  Schedule a complete foot exam at least once a year (annually) or more often if you have foot problems. If you have foot problems, report any cuts, sores, or bruises to your health care provider immediately.  Contact a health care provider if:  You have a medical condition that increases your risk of infection and you have any cuts, sores, or bruises on your feet.  You have an injury that is not healing.  You have redness on your legs or feet.  You feel burning or tingling in your legs or feet.  You have pain or cramps in your legs and feet.  Your legs or feet are numb.  Your feet always feel cold.  You have pain around a toenail. Get help right away if:  You have a wound, scrape, corn, or callus on your foot and: ? You have pain, swelling, or redness that gets worse. ? You have fluid or blood coming from  the wound, scrape, corn, or callus. ? Your wound, scrape, corn, or callus feels warm to the touch. ? You have pus or a bad smell coming from the wound, scrape, corn, or callus. ? You have a fever. ? You have a red line going up your leg. Summary  Check your feet every day for cuts, sores, red spots, swelling, and blisters.  Moisturize feet and legs daily.  Wear shoes that fit properly and have enough cushioning.  If you have foot problems, report any cuts, sores, or bruises to your health care provider immediately.  Schedule a complete foot exam at least once a year (annually) or more often if you have foot problems. This information is not intended to replace advice given to you by your health care provider. Make sure you discuss any questions you have with your health care provider. Document Revised: 05/11/2019 Document Reviewed: 09/19/2016 Elsevier Patient Education  2020 [employer].

## 2020-05-14 NOTE — Progress Notes (Signed)
Medication refills for all meds   Needs new glucometer   Interested in getting flu vaccine today  FSBS 175  A1C 6.5

## 2020-05-14 NOTE — Progress Notes (Signed)
Patient ID: Johnny Nichols, male    DOB: August 25, 1966  MRN: [id number]  CC: Diabetes Follow-Up  Subjective: Johnny Nichols is a 54 y.o. male with history of essential hypertension, obstructive sleep apnea, dental caries, diabetes mellitus without complication, hyperlipidemia, and obesity who presents for diabetes follow-up.  1. DIABETES TYPE 2 FOLLOW-UP: 10/04/2019: Encounter with Dr. Wynetta Emery. Continued on Metformin, Glipizide, and Trulicity. Hemoglobin A1C scheduled for future. Dietary counseling given.   05/14/2020: Last A1C: 6.5% on 05/14/2020 Are you fasting today: _0  Yes _1  No, prior to visit ate  tuna salad with sweet relish, tomatoes, onion, celery, tomatoes, on a bed of lettuce, diet Dr. Malachi Bonds and Georgiann Hahn Have you taken your anti-diabetic medications today: _2  Yes _3  No  Med Adherence:  _4  Yes    _5  No Medication side effects:  _6  Yes    _7  No, but concerned about recent erectile dysfunction Home Monitoring?  _8  Yes    _9  No Home glucose results range: 90's - 102 Diet Adherence: _10  Yes    _11  No Exercise: _12  Yes    _13  No Hypoglycemic episodes?: _14  Yes    _15  No Numbness of the feet? _16  Yes    _17  No Retinopathy hx? _18  Yes    _19  No Last eye exam: 4 years ago, reports he has a preferred eye doctor and will schedule soon  Comments: Reports left great toe has a numb spot since 2014, denies any skin breakdown  2. HYPERTENSION FOLLOW-UP: 10/04/2019: Encounter with Dr. Wynetta Emery. Blood pressure not at goal. Encouraged low-salt diet. Continued on Lisinopril and Amlodipine. Norvasc added.  05/14/2020: Currently taking: see medication list Have you taken your blood pressure medication today: _20  Yes _21  No  Med Adherence: _22  Yes    _23  No Medication side effects: _24  Yes    _25  No, but concerned about recent erectile dysfunction Adherence with salt restriction: _26  Yes    _27  No Exercise: Yes _28  No _29  Home Monitoring?: _30  Yes    _31  No Monitoring Frequency: _32  Yes     _33  No Home BP results range: _34  Yes    _35  No  Smoking _36  Yes _37  No SOB? _38  Yes    _39  No Chest Pain?: _40  Yes    _41  No Leg swelling?: _42  Yes    _43  No Headaches?: _44  Yes    _45  No Dizziness? _46  Yes    _47  No  3. HYPERLIPIDEMIA FOLLOW-UP: 10/04/2019: Encounter with Dr. Wynetta Emery. Lipid profile rechecked to see if there has been improvement. If not will change to Lipitor. Healthy eating habits encouraged. Pravastatin continued.   05/14/2020: Last Lipid Panel results:  HDL  Date Value Ref Range Status  10/12/2019 36 (L) >39 mg/dL Final   Triglycerides  Date Value Ref Range Status  10/12/2019 91 0 - 149 mg/dL Final    Are you fasting today: _48  Yes _49  No Med Adherence: _50  Yes    _51  No Medication side effects: _52  Yes    _53  No Muscle aches:  _54  Yes    _55  No Diet Adherence: _56  Yes    _57  No  Patient Active Problem List   Diagnosis Date Noted  . Obesity (BMI 30.0-34.9) 02/08/2018  . Hyperlipidemia 10/25/2015  . Essential hypertension 10/11/2015  . Diabetes mellitus without complication ([place]) 22/63/3354  . Dental caries 04/04/2013  . OSA (obstructive sleep apnea) 03/16/2013     Current Outpatient Medications on File Prior to Visit  Medication Sig Dispense Refill  . amLODipine (NORVASC) 5  MG tablet Take 1 tablet (5 mg total) by mouth daily. 30 tablet 6  . atorvastatin (LIPITOR) 20 MG tablet Take 1 tablet (20 mg total) by mouth daily. Stop Pravastatin 30 tablet 6  . Blood Glucose Monitoring Suppl (TRUE METRIX METER) w/Device KIT Use to check blood sugar as instructed. 1 kit 0  . Dulaglutide (TRULICITY) 1.5 TG/2.5WL SOPN Inject 1.5 mg into the skin once a week. 4 pen 4  . glipiZIDE (GLUCOTROL) 5 MG tablet TAKE 1 TABLET BY MOUTH 2 (TWO) TIMES DAILY BEFORE A MEAL. 60 tablet 0  . glucose blood (TRUE METRIX BLOOD GLUCOSE TEST) test strip Use as instructed to check blood sugar daily. 100 each 11  . lisinopril (ZESTRIL) 20 MG tablet Take 1 tablet (20 mg total) by mouth daily. 90 tablet  3  . metFORMIN (GLUCOPHAGE) 1000 MG tablet Take 1 tablet (1,000 mg total) by mouth 2 (two) times daily with a meal. 180 tablet 1  . TRUEPLUS LANCETS 28G MISC Use as instructed to check blood sugar daily. 100 each 11   No current facility-administered medications on file prior to visit.    Allergies  Allergen Reactions  . Clindamycin/Lincomycin     Social History   Socioeconomic History  . Marital status: Divorced    Spouse name: Not on file  . Number of children: Not on file  . Years of education: Not on file  . Highest education level: Not on file  Occupational History  . Not on file  Tobacco Use  . Smoking status: Never Smoker  . Smokeless tobacco: Never Used  Substance and Sexual Activity  . Alcohol use: No    Comment: no drink x 3 weeks  . Drug use: No  . Sexual activity: Not on file  Other Topics Concern  . Not on file  Social History Narrative  . Not on file   Social Determinants of Health   Financial Resource Strain:   . Difficulty of Paying Living Expenses: Not on file  Food Insecurity:   . Worried About Charity fundraiser in the Last Year: Not on file  . Ran Out of Food in the Last Year: Not on file  Transportation Needs:   . Lack of Transportation (Medical): Not on file  . Lack of Transportation (Non-Medical): Not on file  Physical Activity:   . Days of Exercise per Week: Not on file  . Minutes of Exercise per Session: Not on file  Stress:   . Feeling of Stress : Not on file  Social Connections:   . Frequency of Communication with Friends and Family: Not on file  . Frequency of Social Gatherings with Friends and Family: Not on file  . Attends Religious Services: Not on file  . Active Member of Clubs or Organizations: Not on file  . Attends Archivist Meetings: Not on file  . Marital Status: Not on file  Intimate Partner Violence:   . Fear of Current or Ex-Partner: Not on file  . Emotionally Abused: Not on file  . Physically Abused: Not  on file  . Sexually Abused: Not on file    Family History  Problem Relation Age of Onset  . Heart disease Mother   . Cancer Father   . Hypertension Other   . Obesity Other     No past surgical history on file.  ROS: Review of Systems Negative except as stated above  PHYSICAL EXAM: Vitals with BMI 05/14/2020 10/04/2019 01/14/2019  Height _0  - -  Weight 253 lbs 3 oz 232 lbs 212 lbs  BMI 84.53 - -  Systolic 646 803 212  Diastolic 84 88 82  Pulse 85 - -   Wt Readings from Last 3 Encounters:  05/14/20 253 lb 3.2 oz (114.9 kg)  10/04/19 232 lb (105.2 kg)  01/14/19 212 lb (96.2 kg)   Physical Exam General appearance - alert, well appearing, and in no distress and oriented to person, place, and time Mental status - alert, oriented to person, place, and time, normal mood, behavior, speech, dress, motor activity, and thought processes Eyes - pupils equal and reactive, extraocular eye movements intact Neck - supple, no significant adenopathy Lymphatics - no palpable lymphadenopathy, no hepatosplenomegaly Chest - clear to auscultation, no wheezes, rales or rhonchi, symmetric air entry, no tachypnea, retractions or cyanosis Heart - normal rate, regular rhythm, normal S1, S2, no murmurs, rubs, clicks or gallops Neurological - alert, oriented, normal speech, no focal findings or movement disorder noted, neck supple without rigidity, cranial nerves II through XII intact, funduscopic exam normal, discs flat and sharp, DTR's normal and symmetric, motor and sensory grossly normal bilaterally, normal muscle tone, no tremors, strength 5/5 Musculoskeletal - no joint tenderness, deformity or swelling, no muscular tenderness noted, full range of motion without pain Skin - normal coloration and turgor, no rashes, no suspicious skin lesions noted DM Foot Exam Color: normal Sensation Monofilament:normal right and left dorsal, plantar, toe and distal Sensation Vibration:normal right and left dorsal,  plantar, toe and distal Circulation: Pulses normal right and left pedal and posterial tibial  Lesions: normal dorsal, plantar, toe and distal   Results for orders placed or performed in visit on 05/14/20  Glucose (CBG)  Result Value Ref Range   POC Glucose 175 (A) 70 - 99 mg/dl  POCT glycosylated hemoglobin (Hb A1C)  Result Value Ref Range   Hemoglobin A1C 6.5 (A) 4.0 - 5.6 %   HbA1c POC (<> result, manual entry)     HbA1c, POC (prediabetic range)     HbA1c, POC (controlled diabetic range)      ASSESSMENT AND PLAN: 1. Controlled type 2 diabetes mellitus without complication, without long-term current use of insulin ([place]): - Controlled.  - CBG elevated during today's visit at 175, non-fasting, and asymptomatic. - Hemoglobin A1C at goal today at 6.5%, goal < 7%. Improved since last visit 7 months ago when hemoglobin A1C was 7.8%. - Continue Glipizide, Metformin, and Trulicity as prescribed.  - Replacement glucometer prescribed. - To achieve an A1C goal of less than or equal to 7.0 percent, a fasting blood sugar of 80 to 130 mg/dL and a postprandial glucose (90 to 120 minutes after a meal) less than 180 mg/dL. In the event of sugars less than 60 mg/dl or greater than 400 mg/dl please notify the clinic ASAP. It is recommended that you undergo annual eye exams and annual foot exams. - Discussed the importance of healthy eating habits, low-carbohydrate diet, low-sugar diet, regular aerobic exercise (at least 150 minutes a week as tolerated) and medication compliance to achieve or maintain control of diabetes. - CMP to check kidney function, liver function, and electrolyte balance. - CBC to check for anemia. - Diabetic eye examination due. Patient reports he has a preferred eye doctor and will schedule an appointment soon. - Follow-up with primary in 3 months or sooner if needed.  - CMP14+EGFR - CBC - Glucose (CBG) - POCT glycosylated hemoglobin (Hb A1C) - glipiZIDE (GLUCOTROL) 5 MG  tablet; TAKE 1 TABLET BY  MOUTH 2 (TWO) TIMES DAILY BEFORE A MEAL.  Dispense: 60 tablet; Refill: 2 - metFORMIN (GLUCOPHAGE) 1000 MG tablet; Take 1 tablet (1,000 mg total) by mouth 2 (two) times daily with a meal.  Dispense: 180 tablet; Refill: 0 - Dulaglutide (TRULICITY) 1.5 FB/3.7HK SOPN; Inject 1.5 mg into the skin once a week.  Dispense: 15 mL; Refill: 2 - Blood Glucose Monitoring Suppl (TRUE METRIX METER) w/Device KIT; Use to check blood sugar as instructed.  Dispense: 1 kit; Refill: 0  2. Essential hypertension: - Level of control unknown as patient does not monitor blood pressure at home. - Today blood pressure close to goal at 137/84, goal < 130/80. - Continue Amlodipine and Lisinopril as prescribed.  - Counseled on blood pressure goal of less than 130/80, low-sodium, DASH diet, medication compliance, 150 minutes of moderate intensity exercise per week as tolerated. Discussed medication compliance, adverse effects. - CMP to check kidney function, liver function, and electrolyte balance.  - CMP to check for anemia.  - Follow-up with primary physician in 3 months or sooner if needed. - CMP14+EGFR - CBC - amLODipine (NORVASC) 5 MG tablet; Take 1 tablet (5 mg total) by mouth daily.  Dispense: 30 tablet; Refill: 2 - lisinopril (ZESTRIL) 20 MG tablet; Take 1 tablet (20 mg total) by mouth daily.  Dispense: 90 tablet; Refill: 0  3. Hyperlipidemia associated with type 2 diabetes mellitus ([place]): - Practice low-fat heart healthy diet and at least 150 minutes of moderate intensity exercise weekly as tolerated.  - Continue Atorvastatin as prescribed.  - Last lipid panel obtained 10/12/2019. - Follow-up with primary physician in 3 months or sooner if needed. - atorvastatin (LIPITOR) 20 MG tablet; Take 1 tablet (20 mg total) by mouth daily. Stop Pravastatin  Dispense: 30 tablet; Refill: 2  4. Erectile dysfunction, unspecified erectile dysfunction type: - Patient requesting medication for erectile  dysfunction.  - Trial of Sildenafil for erectile dysfunction. - Counseled patient that management of diabetes, hypertension, and weight may help to improve symptoms of erectile dysfunction.  - Patient advised of possible side effects of this medication including prolong erection, flushing, headaches, stuffy nose and sudden vision and hearing changes.  Patient told to be seen in ER if he has erection lasting longer than 3-4 hrs, or if he has sudden vision changes or hearing loss. - Counseled against using Sildenafil with nitrates. - Follow-up with primary physician as needed. - sildenafil (VIAGRA) 100 MG tablet; Take 0.5-1 tablets (50-100 mg total) by mouth daily as needed for erectile dysfunction.  Dispense: 5 tablet; Refill: 2  5. Obesity (BMI 30.0-34.9): - See #1 above.  Patient was given the opportunity to ask questions.  Patient verbalized understanding of the plan and was able to repeat key elements of the plan. Patient was given clear instructions to go to Emergency Department or return to medical center if symptoms don't improve, worsen, or new problems develop.The patient verbalized understanding.   Camillia Herter, NP

## 2020-05-15 LAB — CMP14+EGFR
ALT: 26 IU/L (ref 0–44)
AST: 16 IU/L (ref 0–40)
Albumin/Globulin Ratio: 1.6 (ref 1.2–2.2)
Albumin: 4.6 g/dL (ref 3.8–4.9)
Alkaline Phosphatase: 98 IU/L (ref 44–121)
BUN/Creatinine Ratio: 18 (ref 9–20)
BUN: 16 mg/dL (ref 6–24)
Bilirubin Total: 0.3 mg/dL (ref 0.0–1.2)
CO2: 23 mmol/L (ref 20–29)
Calcium: 9.7 mg/dL (ref 8.7–10.2)
Chloride: 101 mmol/L (ref 96–106)
Creatinine, Ser: 0.9 mg/dL (ref 0.76–1.27)
GFR calc Af Amer: 112 mL/min/{1.73_m2} (ref >59)
GFR calc non Af Amer: 97 mL/min/{1.73_m2} (ref >59)
Globulin, Total: 2.8 g/dL (ref 1.5–4.5)
Glucose: 153 mg/dL — ABNORMAL HIGH (ref 65–99)
Potassium: 4.5 mmol/L (ref 3.5–5.2)
Sodium: 139 mmol/L (ref 134–144)
Total Protein: 7.4 g/dL (ref 6.0–8.5)

## 2020-05-15 LAB — CBC
Hematocrit: 44.5 % (ref 37.5–51.0)
Hemoglobin: 14.6 g/dL (ref 13.0–17.7)
MCH: 28.7 pg (ref 26.6–33.0)
MCHC: 32.8 g/dL (ref 31.5–35.7)
MCV: 88 fL (ref 79–97)
Platelets: 285 10*3/uL (ref 150–450)
RBC: 5.08 x10E6/uL (ref 4.14–5.80)
RDW: 12.9 % (ref 11.6–15.4)
WBC: 12.3 10*3/uL — ABNORMAL HIGH (ref 3.4–10.8)

## 2020-05-15 NOTE — Progress Notes (Signed)
Please call patient with update.   Kidney function normal.   Liver function normal.   No anemia.   Will repeat CBC w/ diff in 2 weeks to recheck white blood cells. Please return to the lab in 2 weeks for this.

## 2020-05-15 NOTE — Addendum Note (Signed)
Addended by: Rema Fendt on: 05/15/2020 02:24 PM   Modules accepted: Orders

## 2020-06-04 ENCOUNTER — Ambulatory Visit: Payer: Self-pay | Attending: Internal Medicine

## 2020-06-04 ENCOUNTER — Other Ambulatory Visit: Payer: Self-pay

## 2020-06-04 DIAGNOSIS — E119 Type 2 diabetes mellitus without complications: Secondary | ICD-10-CM

## 2020-06-04 DIAGNOSIS — I10 Essential (primary) hypertension: Secondary | ICD-10-CM

## 2020-06-05 LAB — CBC WITH DIFFERENTIAL
Basophils Absolute: 0.1 10*3/uL (ref 0.0–0.2)
Basos: 1 %
EOS (ABSOLUTE): 1.2 10*3/uL — ABNORMAL HIGH (ref 0.0–0.4)
Eos: 14 %
Hematocrit: 42.9 % (ref 37.5–51.0)
Hemoglobin: 13.8 g/dL (ref 13.0–17.7)
Immature Grans (Abs): 0 10*3/uL (ref 0.0–0.1)
Immature Granulocytes: 0 %
Lymphocytes Absolute: 2.2 10*3/uL (ref 0.7–3.1)
Lymphs: 24 %
MCH: 27.9 pg (ref 26.6–33.0)
MCHC: 32.2 g/dL (ref 31.5–35.7)
MCV: 87 fL (ref 79–97)
Monocytes Absolute: 0.7 10*3/uL (ref 0.1–0.9)
Monocytes: 8 %
Neutrophils Absolute: 4.8 10*3/uL (ref 1.4–7.0)
Neutrophils: 53 %
RBC: 4.95 x10E6/uL (ref 4.14–5.80)
RDW: 12.8 % (ref 11.6–15.4)
WBC: 9 10*3/uL (ref 3.4–10.8)

## 2020-06-06 ENCOUNTER — Telehealth: Payer: Self-pay

## 2020-06-06 NOTE — Telephone Encounter (Signed)
Contacted pt to go over lab results pt is aware and doesn't have any questions or concerns 

## 2020-06-06 NOTE — Progress Notes (Signed)
Please call patient with update.   No anemia.

## 2020-06-19 MED FILL — METFORMIN HCL 1000 MG TABS: 1000 | 30 days supply | Qty: 60 | Fill #1

## 2020-06-19 MED FILL — SILDENAFIL CITRATE 100 MG T: 100 | 15 days supply | Qty: 5 | Fill #1

## 2020-06-19 MED FILL — ?GLIPIZIDE 5MG TABLET: 5 | 30 days supply | Qty: 60 | Fill #1

## 2020-06-19 MED FILL — ?ATORVASTATIN 20 MG TABLET: 20 | 30 days supply | Qty: 30 | Fill #1

## 2020-06-19 MED FILL — ?AMLODIPINE BESYLATE 5MG TA: 5 | 30 days supply | Qty: 30 | Fill #1

## 2020-07-24 MED FILL — ?ATORVASTATIN 20 MG TABLET: 20 | 30 days supply | Qty: 30 | Fill #2

## 2020-07-24 MED FILL — AMLODIPINE BESYLATE 5 MG TA: 5 | 30 days supply | Qty: 30 | Fill #2

## 2020-07-24 MED FILL — METFORMIN HCL 1000 MG TABS: 1000 | 30 days supply | Qty: 60 | Fill #2

## 2020-07-24 MED FILL — ?GLIPIZIDE 5MG TABLET: 5 | 30 days supply | Qty: 60 | Fill #2

## 2020-07-24 MED FILL — LISINOPRIL 20 MG TABLET: 20 | 30 days supply | Qty: 30 | Fill #7

## 2020-07-24 MED FILL — SILDENAFIL CITRATE 100 MG T: 100 | 30 days supply | Qty: 5 | Fill #2

## 2020-08-13 ENCOUNTER — Other Ambulatory Visit: Payer: Self-pay

## 2020-08-13 ENCOUNTER — Other Ambulatory Visit: Payer: Self-pay | Admitting: Internal Medicine

## 2020-08-13 ENCOUNTER — Telehealth: Payer: Self-pay | Admitting: Internal Medicine

## 2020-08-13 ENCOUNTER — Encounter: Payer: Self-pay | Admitting: Internal Medicine

## 2020-08-13 ENCOUNTER — Ambulatory Visit: Payer: Self-pay | Attending: Internal Medicine | Admitting: Internal Medicine

## 2020-08-13 VITALS — Wt 225.0 lb

## 2020-08-13 DIAGNOSIS — N529 Male erectile dysfunction, unspecified: Secondary | ICD-10-CM

## 2020-08-13 DIAGNOSIS — E785 Hyperlipidemia, unspecified: Secondary | ICD-10-CM

## 2020-08-13 DIAGNOSIS — I10 Essential (primary) hypertension: Secondary | ICD-10-CM

## 2020-08-13 DIAGNOSIS — R079 Chest pain, unspecified: Secondary | ICD-10-CM

## 2020-08-13 DIAGNOSIS — E669 Obesity, unspecified: Secondary | ICD-10-CM

## 2020-08-13 DIAGNOSIS — E1169 Type 2 diabetes mellitus with other specified complication: Secondary | ICD-10-CM

## 2020-08-13 MED ORDER — GLIPIZIDE 5 MG PO TABS: ORAL_TABLET | ORAL | 6 refills | Status: DC

## 2020-08-13 MED ORDER — LISINOPRIL 20 MG PO TABS: 20.0000 mg | ORAL_TABLET | Freq: Every day | ORAL | 6 refills | Status: DC

## 2020-08-13 MED ORDER — ATORVASTATIN CALCIUM 20 MG PO TABS: 20.0000 mg | ORAL_TABLET | Freq: Every day | ORAL | 6 refills | Status: DC

## 2020-08-13 MED ORDER — AMLODIPINE BESYLATE 5 MG PO TABS: 5.0000 mg | ORAL_TABLET | Freq: Every day | ORAL | 6 refills | Status: DC

## 2020-08-13 MED ORDER — TRULICITY 1.5 MG/0.5ML ~~LOC~~ SOAJ: 1.5000 mg | SUBCUTANEOUS | 2 refills | Status: DC

## 2020-08-13 MED ORDER — SILDENAFIL CITRATE 100 MG PO TABS: 50.0000 mg | ORAL_TABLET | Freq: Every day | ORAL | 3 refills | Status: DC | PRN

## 2020-08-13 MED ORDER — METFORMIN HCL 1000 MG PO TABS: 1000.0000 mg | ORAL_TABLET | Freq: Two times a day (BID) | ORAL | 6 refills | Status: DC

## 2020-08-13 NOTE — Telephone Encounter (Signed)
Called patient and LVM for patient to schedule a 4 month follow up with Dr. Laural Benes (aorund 12/13/2020). Advised patient to call [phone] to schedule.

## 2020-08-13 NOTE — Progress Notes (Signed)
Virtual Visit via Telephone Note  I connected with Johnny Nichols on 08/13/20 at 3:49 p.m by telephone and verified that I am speaking with the correct person using two identifiers.  Location: Patient: home Provider: office  Only the patient, myself and my CMA Sallyanne Havers participated in this telephone encounter. I discussed the limitations, risks, security and privacy concerns of performing an evaluation and management service by telephone and the availability of in person appointments. I also discussed with the patient that there may be a patient responsible charge related to this service. The patient expressed understanding and agreed to proceed.   History of Present Illness: DM, HTN, OSA, HL,obesity.  Last visit with me was February of this year.  He saw the nurse practitioner in September of this year.  Had a cold 1 wk ago.  Symptoms that consist of mild cough and some runny nose.  Symptoms have since resolved.  However over the past several days he has noticed some tightness in his chest whenever he does any type of upper body activity like lifting or pushing.  Associated with a feeling of tightness around the neck.  No radiation down the arm.  No shortness of breath.  He is concerned because his father had coronary artery disease in his early 35s. -He reports doing 20 squats and 20 push-ups about every other day.  No chest pains or chest tightness noticed when he is doing these activities. -Denies any recent long distance travel and no lower extremity edema.   DM:  Checking BS 1-2 times/day. Range 97-130.  This a.m BS was 141, this afternoon 128 Compliant with Trulicity, Glucotrol and Metformin Trying to eat more proteins and less starches.  Works for a [employer] and tries to resist temptations.   Not as much fresh veggies.  Was 250 lbs in 05/2020 but now at 225 lbs.  Wants to get to 200 lbs.   HTN: Reports compliance with lisinopril and amlodipine.  No device to check blood  pressure.  He tries to limit salt in the foods.  ED: Requests refill on sildenafil.  HL: Reports compliance with atorvastatin.   Outpatient Encounter Medications as of 08/13/2020  Medication Sig  . amLODipine (NORVASC) 5 MG tablet Take 1 tablet (5 mg total) by mouth daily.  Marland Kitchen atorvastatin (LIPITOR) 20 MG tablet Take 1 tablet (20 mg total) by mouth daily. Stop Pravastatin  . Blood Glucose Monitoring Suppl (TRUE METRIX METER) w/Device KIT Use to check blood sugar as instructed.  . Dulaglutide (TRULICITY) 1.5 PP/9.4FE SOPN Inject 1.5 mg into the skin once a week.  Marland Kitchen glipiZIDE (GLUCOTROL) 5 MG tablet TAKE 1 TABLET BY MOUTH 2 (TWO) TIMES DAILY BEFORE A MEAL.  Marland Kitchen glucose blood (TRUE METRIX BLOOD GLUCOSE TEST) test strip Use as instructed to check blood sugar daily.  Marland Kitchen lisinopril (ZESTRIL) 20 MG tablet Take 1 tablet (20 mg total) by mouth daily.  . metFORMIN (GLUCOPHAGE) 1000 MG tablet Take 1 tablet (1,000 mg total) by mouth 2 (two) times daily with a meal.  . sildenafil (VIAGRA) 100 MG tablet Take 0.5-1 tablets (50-100 mg total) by mouth daily as needed for erectile dysfunction.  . TRUEPLUS LANCETS 28G MISC Use as instructed to check blood sugar daily.   No facility-administered encounter medications on file as of 08/13/2020.      Observations/Objective: Wt Readings from Last 3 Encounters:  08/13/20 225 lb (102.1 kg)  05/14/20 253 lb 3.2 oz (114.9 kg)  10/04/19 232 lb (105.2 kg)  Lab Results  Component Value Date   HGBA1C 6.5 (A) 05/14/2020   Lab Results  Component Value Date   WBC 9.0 06/04/2020   HGB 13.8 06/04/2020   HCT 42.9 06/04/2020   MCV 87 06/04/2020   PLT 285 05/14/2020     Assessment and Plan: 1. Type 2 diabetes mellitus with obesity (HCC) Blood sugars are good.  He will continue Metformin, glipizide and Trulicity.  Commended him on weight loss.  Encouraged him to keep up the good work with healthy eating habits. - metFORMIN (GLUCOPHAGE) 1000 MG tablet; Take 1  tablet (1,000 mg total) by mouth 2 (two) times daily with a meal.  Dispense: 60 tablet; Refill: 6 - glipiZIDE (GLUCOTROL) 5 MG tablet; TAKE 1 TABLET BY MOUTH 2 (TWO) TIMES DAILY BEFORE A MEAL.  Dispense: 60 tablet; Refill: 6 - Dulaglutide (TRULICITY) 1.5 ZH/0.8MV SOPN; Inject 1.5 mg into the skin once a week.  Dispense: 15 mL; Refill: 2 - Hemoglobin A1c; Future  2. Essential hypertension Continue current medications.  He will come later this week to see our clinical pharmacist to have blood pressure check.  He will also receive the flu shot at the same time. - lisinopril (ZESTRIL) 20 MG tablet; Take 1 tablet (20 mg total) by mouth daily.  Dispense: 30 tablet; Refill: 6 - amLODipine (NORVASC) 5 MG tablet; Take 1 tablet (5 mg total) by mouth daily.  Dispense: 30 tablet; Refill: 6  3. Hyperlipidemia associated with type 2 diabetes mellitus (HCC) - atorvastatin (LIPITOR) 20 MG tablet; Take 1 tablet (20 mg total) by mouth daily. Stop Pravastatin  Dispense: 30 tablet; Refill: 6  4. Erectile dysfunction, unspecified erectile dysfunction type - sildenafil (VIAGRA) 100 MG tablet; Take 0.5-1 tablets (50-100 mg total) by mouth daily as needed for erectile dysfunction.  Dispense: 5 tablet; Refill: 3  5. Chest pain in adult Sounds atypical but patient does have risk factors including diabetes, high cholesterol, obesity and family history.  Will refer to cardiology - Ambulatory referral to Cardiology  6.  Need for influenza shot He will come later this week to see the clinical pharmacist to be given the flu shot and to have his blood pressure checked. Follow Up Instructions: 4 mths   I discussed the assessment and treatment plan with the patient. The patient was provided an opportunity to ask questions and all were answered. The patient agreed with the plan and demonstrated an understanding of the instructions.   The patient was advised to call back or seek an in-person evaluation if the symptoms worsen  or if the condition fails to improve as anticipated.  I provided 21 minutes of non-face-to-face time during this encounter.   Karle Plumber, MD

## 2020-08-14 MED FILL — ?TRULICITY 1.5 MG/0.5 ML PE: 1.5 | 28 days supply | Qty: 2 | Fill #0

## 2020-08-14 MED FILL — SILDENAFIL CITRATE 100 MG T: 100 | 15 days supply | Qty: 5 | Fill #0

## 2020-08-18 ENCOUNTER — Inpatient Hospital Stay (HOSPITAL_COMMUNITY)
Admission: EM | Admit: 2020-08-18 | Discharge: 2020-08-28 | DRG: 233 | Disposition: A | Discharge status: Home / Self Care | Payer: Self-pay | Attending: Cardiothoracic Surgery | Admitting: Cardiothoracic Surgery

## 2020-08-18 ENCOUNTER — Inpatient Hospital Stay (HOSPITAL_COMMUNITY): Payer: Self-pay

## 2020-08-18 ENCOUNTER — Inpatient Hospital Stay (HOSPITAL_COMMUNITY)
Admission: EM | Disposition: A | Discharge status: Home / Self Care | Payer: Self-pay | Source: Home / Self Care | Attending: Cardiothoracic Surgery

## 2020-08-18 ENCOUNTER — Other Ambulatory Visit: Payer: Self-pay

## 2020-08-18 ENCOUNTER — Emergency Department (HOSPITAL_COMMUNITY): Payer: Self-pay

## 2020-08-18 ENCOUNTER — Encounter (HOSPITAL_COMMUNITY): Payer: Self-pay

## 2020-08-18 DIAGNOSIS — Z6834 Body mass index (BMI) 34.0-34.9, adult: Secondary | ICD-10-CM

## 2020-08-18 DIAGNOSIS — Z794 Long term (current) use of insulin: Secondary | ICD-10-CM

## 2020-08-18 DIAGNOSIS — I25810 Atherosclerosis of coronary artery bypass graft(s) without angina pectoris: Secondary | ICD-10-CM | POA: Diagnosis present

## 2020-08-18 DIAGNOSIS — Z9689 Presence of other specified functional implants: Secondary | ICD-10-CM

## 2020-08-18 DIAGNOSIS — I214 Non-ST elevation (NSTEMI) myocardial infarction: Secondary | ICD-10-CM

## 2020-08-18 DIAGNOSIS — I480 Paroxysmal atrial fibrillation: Secondary | ICD-10-CM

## 2020-08-18 DIAGNOSIS — I340 Nonrheumatic mitral (valve) insufficiency: Secondary | ICD-10-CM

## 2020-08-18 DIAGNOSIS — I213 ST elevation (STEMI) myocardial infarction of unspecified site: Principal | ICD-10-CM | POA: Diagnosis present

## 2020-08-18 DIAGNOSIS — I4819 Other persistent atrial fibrillation: Secondary | ICD-10-CM

## 2020-08-18 DIAGNOSIS — I083 Combined rheumatic disorders of mitral, aortic and tricuspid valves: Secondary | ICD-10-CM | POA: Diagnosis present

## 2020-08-18 DIAGNOSIS — I2510 Atherosclerotic heart disease of native coronary artery without angina pectoris: Secondary | ICD-10-CM

## 2020-08-18 DIAGNOSIS — J9811 Atelectasis: Secondary | ICD-10-CM | POA: Diagnosis present

## 2020-08-18 DIAGNOSIS — I10 Essential (primary) hypertension: Secondary | ICD-10-CM | POA: Diagnosis present

## 2020-08-18 DIAGNOSIS — E785 Hyperlipidemia, unspecified: Secondary | ICD-10-CM | POA: Diagnosis present

## 2020-08-18 DIAGNOSIS — G4733 Obstructive sleep apnea (adult) (pediatric): Secondary | ICD-10-CM | POA: Diagnosis present

## 2020-08-18 DIAGNOSIS — I350 Nonrheumatic aortic (valve) stenosis: Secondary | ICD-10-CM

## 2020-08-18 DIAGNOSIS — Z7902 Long term (current) use of antithrombotics/antiplatelets: Secondary | ICD-10-CM

## 2020-08-18 DIAGNOSIS — I4891 Unspecified atrial fibrillation: Secondary | ICD-10-CM

## 2020-08-18 DIAGNOSIS — Z833 Family history of diabetes mellitus: Secondary | ICD-10-CM

## 2020-08-18 DIAGNOSIS — E118 Type 2 diabetes mellitus with unspecified complications: Secondary | ICD-10-CM

## 2020-08-18 DIAGNOSIS — J939 Pneumothorax, unspecified: Secondary | ICD-10-CM

## 2020-08-18 DIAGNOSIS — I110 Hypertensive heart disease with heart failure: Secondary | ICD-10-CM | POA: Diagnosis present

## 2020-08-18 DIAGNOSIS — Z01811 Encounter for preprocedural respiratory examination: Secondary | ICD-10-CM

## 2020-08-18 DIAGNOSIS — J189 Pneumonia, unspecified organism: Secondary | ICD-10-CM | POA: Clinically undetermined

## 2020-08-18 DIAGNOSIS — Z8249 Family history of ischemic heart disease and other diseases of the circulatory system: Secondary | ICD-10-CM

## 2020-08-18 DIAGNOSIS — Z9119 Patient's noncompliance with other medical treatment and regimen: Secondary | ICD-10-CM

## 2020-08-18 DIAGNOSIS — E119 Type 2 diabetes mellitus without complications: Secondary | ICD-10-CM

## 2020-08-18 DIAGNOSIS — I5043 Acute on chronic combined systolic (congestive) and diastolic (congestive) heart failure: Secondary | ICD-10-CM | POA: Diagnosis present

## 2020-08-18 DIAGNOSIS — R011 Cardiac murmur, unspecified: Secondary | ICD-10-CM

## 2020-08-18 DIAGNOSIS — Z79899 Other long term (current) drug therapy: Secondary | ICD-10-CM

## 2020-08-18 DIAGNOSIS — D62 Acute posthemorrhagic anemia: Secondary | ICD-10-CM | POA: Diagnosis not present

## 2020-08-18 DIAGNOSIS — E669 Obesity, unspecified: Secondary | ICD-10-CM | POA: Diagnosis present

## 2020-08-18 DIAGNOSIS — I255 Ischemic cardiomyopathy: Secondary | ICD-10-CM | POA: Diagnosis present

## 2020-08-18 DIAGNOSIS — E66811 Obesity, class 1: Secondary | ICD-10-CM | POA: Diagnosis present

## 2020-08-18 DIAGNOSIS — X500XXA Overexertion from strenuous movement or load, initial encounter: Secondary | ICD-10-CM

## 2020-08-18 DIAGNOSIS — Z809 Family history of malignant neoplasm, unspecified: Secondary | ICD-10-CM

## 2020-08-18 DIAGNOSIS — J9601 Acute respiratory failure with hypoxia: Secondary | ICD-10-CM | POA: Diagnosis present

## 2020-08-18 DIAGNOSIS — Z20822 Contact with and (suspected) exposure to covid-19: Secondary | ICD-10-CM | POA: Diagnosis present

## 2020-08-18 DIAGNOSIS — Z7984 Long term (current) use of oral hypoglycemic drugs: Secondary | ICD-10-CM

## 2020-08-18 DIAGNOSIS — Z9114 Patient's other noncompliance with medication regimen: Secondary | ICD-10-CM

## 2020-08-18 DIAGNOSIS — Z4659 Encounter for fitting and adjustment of other gastrointestinal appliance and device: Secondary | ICD-10-CM

## 2020-08-18 DIAGNOSIS — Z951 Presence of aortocoronary bypass graft: Secondary | ICD-10-CM

## 2020-08-18 HISTORY — PX: LEFT HEART CATH AND CORONARY ANGIOGRAPHY: CATH118249

## 2020-08-18 LAB — COMPREHENSIVE METABOLIC PANEL
ALT: 24 U/L (ref 0–44)
AST: 38 U/L (ref 15–41)
Albumin: 3.4 g/dL — ABNORMAL LOW (ref 3.5–5.0)
Alkaline Phosphatase: 49 U/L (ref 38–126)
Anion gap: 13 (ref 5–15)
BUN: 19 mg/dL (ref 6–20)
CO2: 20 mmol/L — ABNORMAL LOW (ref 22–32)
Calcium: 8.9 mg/dL (ref 8.9–10.3)
Chloride: 105 mmol/L (ref 98–111)
Creatinine, Ser: 1.04 mg/dL (ref 0.61–1.24)
GFR, Estimated: >60 mL/min (ref >60)
Glucose, Bld: 185 mg/dL — ABNORMAL HIGH (ref 70–99)
Potassium: 3.7 mmol/L (ref 3.5–5.1)
Sodium: 138 mmol/L (ref 135–145)
Total Bilirubin: 0.6 mg/dL (ref 0.3–1.2)
Total Protein: 6.9 g/dL (ref 6.5–8.1)

## 2020-08-18 LAB — ECHOCARDIOGRAM COMPLETE
AR max vel: 1.75 cm2
AV Area VTI: 0.96 cm2
AV Area mean vel: 1.69 cm2
AV Mean grad: 17 mmHg
AV Peak grad: 30.1 mmHg
Ao pk vel: 2.75 m/s
Area-P 1/2: 5.02 cm2
Calc EF: 48 %
Height: 69 in
MV M vel: 5.35 m/s
MV Peak grad: 114.5 mmHg
Radius: 0.4 cm
S' Lateral: 3.8 cm
Single Plane A2C EF: 46.9 %
Single Plane A4C EF: 45.6 %
Weight: 3731.95 oz

## 2020-08-18 LAB — BASIC METABOLIC PANEL
Anion gap: 18 — ABNORMAL HIGH (ref 5–15)
BUN: 18 mg/dL (ref 6–20)
CO2: 12 mmol/L — ABNORMAL LOW (ref 22–32)
Calcium: 8.8 mg/dL — ABNORMAL LOW (ref 8.9–10.3)
Chloride: 109 mmol/L (ref 98–111)
Creatinine, Ser: 0.87 mg/dL (ref 0.61–1.24)
GFR, Estimated: >60 mL/min (ref >60)
Glucose, Bld: 151 mg/dL — ABNORMAL HIGH (ref 70–99)
Potassium: 4 mmol/L (ref 3.5–5.1)
Sodium: 139 mmol/L (ref 135–145)

## 2020-08-18 LAB — CBC WITH DIFFERENTIAL/PLATELET
Abs Immature Granulocytes: 0.08 10*3/uL — ABNORMAL HIGH (ref 0.00–0.07)
Basophils Absolute: 0.1 10*3/uL (ref 0.0–0.1)
Basophils Relative: 1 %
Eosinophils Absolute: 0.4 10*3/uL (ref 0.0–0.5)
Eosinophils Relative: 3 %
HCT: 44.7 % (ref 39.0–52.0)
Hemoglobin: 14.6 g/dL (ref 13.0–17.0)
Immature Granulocytes: 1 %
Lymphocytes Relative: 10 %
Lymphs Abs: 1.7 10*3/uL (ref 0.7–4.0)
MCH: 28.4 pg (ref 26.0–34.0)
MCHC: 32.7 g/dL (ref 30.0–36.0)
MCV: 87 fL (ref 80.0–100.0)
Monocytes Absolute: 1 10*3/uL (ref 0.1–1.0)
Monocytes Relative: 6 %
Neutro Abs: 13.8 10*3/uL — ABNORMAL HIGH (ref 1.7–7.7)
Neutrophils Relative %: 79 %
Platelets: 279 10*3/uL (ref 150–400)
RBC: 5.14 MIL/uL (ref 4.22–5.81)
RDW: 12.7 % (ref 11.5–15.5)
WBC: 17 10*3/uL — ABNORMAL HIGH (ref 4.0–10.5)
nRBC: 0 % (ref 0.0–0.2)

## 2020-08-18 LAB — RESP PANEL BY RT-PCR (FLU A&B, COVID) ARPGX2
Influenza A by PCR: NEGATIVE
Influenza B by PCR: NEGATIVE
SARS Coronavirus 2 by RT PCR: NEGATIVE

## 2020-08-18 LAB — LIPID PANEL
Cholesterol: 163 mg/dL (ref 0–200)
HDL: 40 mg/dL — ABNORMAL LOW (ref >40)
LDL Cholesterol: 110 mg/dL — ABNORMAL HIGH (ref 0–99)
Total CHOL/HDL Ratio: 4.1 RATIO
Triglycerides: 63 mg/dL (ref <150)
VLDL: 13 mg/dL (ref 0–40)

## 2020-08-18 LAB — TSH: TSH: 0.906 u[IU]/mL (ref 0.350–4.500)

## 2020-08-18 LAB — GLUCOSE, CAPILLARY
Glucose-Capillary: 136 mg/dL — ABNORMAL HIGH (ref 70–99)
Glucose-Capillary: 152 mg/dL — ABNORMAL HIGH (ref 70–99)
Glucose-Capillary: 130 mg/dL — ABNORMAL HIGH (ref 70–99)
Glucose-Capillary: 115 mg/dL — ABNORMAL HIGH (ref 70–99)

## 2020-08-18 LAB — MRSA PCR SCREENING: MRSA by PCR: NEGATIVE

## 2020-08-18 LAB — TROPONIN I (HIGH SENSITIVITY)
Troponin I (High Sensitivity): 1826 ng/L (ref <18)
Troponin I (High Sensitivity): 5960 ng/L (ref <18)
Troponin I (High Sensitivity): 8667 ng/L (ref <18)
Troponin I (High Sensitivity): 16847 ng/L (ref <18)

## 2020-08-18 LAB — PROCALCITONIN: Procalcitonin: <0.1 ng/mL

## 2020-08-18 LAB — PROTIME-INR
INR: 1 (ref 0.8–1.2)
Prothrombin Time: 12.9 seconds (ref 11.4–15.2)

## 2020-08-18 LAB — MAGNESIUM: Magnesium: 2.1 mg/dL (ref 1.7–2.4)

## 2020-08-18 LAB — HEMOGLOBIN A1C
Hgb A1c MFr Bld: 7.4 % — ABNORMAL HIGH (ref 4.8–5.6)
Mean Plasma Glucose: 165.68 mg/dL

## 2020-08-18 LAB — HIV ANTIBODY (ROUTINE TESTING W REFLEX): HIV Screen 4th Generation wRfx: NONREACTIVE

## 2020-08-18 LAB — HEPARIN LEVEL (UNFRACTIONATED): Heparin Unfractionated: <0.1 IU/mL — ABNORMAL LOW (ref 0.30–0.70)

## 2020-08-18 LAB — APTT: aPTT: 26 seconds (ref 24–36)

## 2020-08-18 LAB — BRAIN NATRIURETIC PEPTIDE: B Natriuretic Peptide: 85.3 pg/mL (ref 0.0–100.0)

## 2020-08-18 SURGERY — LEFT HEART CATH AND CORONARY ANGIOGRAPHY
Anesthesia: LOCAL

## 2020-08-18 MED ORDER — ACETAMINOPHEN 325 MG PO TABS: 650.0000 mg | ORAL_TABLET | ORAL | Status: DC | PRN

## 2020-08-18 MED ORDER — HEPARIN (PORCINE) IN NACL 1000-0.9 UT/500ML-% IV SOLN
INTRAVENOUS | Status: DC | PRN
  Administered 2020-08-18 (×2): 500 mL

## 2020-08-18 MED ORDER — ONDANSETRON HCL 4 MG/2ML IJ SOLN: 4.0000 mg | Freq: Four times a day (QID) | INTRAMUSCULAR | Status: DC | PRN

## 2020-08-18 MED ORDER — SODIUM CHLORIDE 0.9 % IV SOLN
INTRAVENOUS | Status: AC | PRN
  Administered 2020-08-18: 10 mL/h via INTRAVENOUS

## 2020-08-18 MED ORDER — DOXYCYCLINE HYCLATE 100 MG PO TABS: 100.0000 mg | ORAL_TABLET | Freq: Two times a day (BID) | ORAL | Status: DC

## 2020-08-18 MED ORDER — HEPARIN SODIUM (PORCINE) 1000 UNIT/ML IJ SOLN
INTRAMUSCULAR | Status: DC | PRN
  Administered 2020-08-18: 5000 [IU] via INTRAVENOUS

## 2020-08-18 MED ORDER — SPIRONOLACTONE 12.5 MG HALF TABLET
12.5000 mg | ORAL_TABLET | Freq: Every day | ORAL | Status: DC
  Administered 2020-08-18 – 2020-08-21 (×4): 12.5 mg via ORAL
  Filled 2020-08-18 (×4): qty 1

## 2020-08-18 MED ORDER — HYDRALAZINE HCL 20 MG/ML IJ SOLN: 10.0000 mg | INTRAMUSCULAR | Status: AC | PRN

## 2020-08-18 MED ORDER — SODIUM CHLORIDE 0.9 % IV SOLN
1.0000 g | Freq: Once | INTRAVENOUS | Status: AC
  Administered 2020-08-18: 1 g via INTRAVENOUS
  Filled 2020-08-18: qty 10

## 2020-08-18 MED ORDER — SODIUM CHLORIDE 0.9 % IV SOLN: 250.0000 mL | INTRAVENOUS | Status: DC | PRN

## 2020-08-18 MED ORDER — HEPARIN (PORCINE) 25000 UT/250ML-% IV SOLN
1850.0000 [IU]/h | INTRAVENOUS | Status: DC
  Administered 2020-08-18: 1700 [IU]/h via INTRAVENOUS
  Administered 2020-08-19 – 2020-08-21 (×4): 1850 [IU]/h via INTRAVENOUS
  Filled 2020-08-18 (×6): qty 250

## 2020-08-18 MED ORDER — FUROSEMIDE 10 MG/ML IJ SOLN
40.0000 mg | Freq: Once | INTRAMUSCULAR | Status: AC
  Administered 2020-08-18: 40 mg via INTRAVENOUS

## 2020-08-18 MED ORDER — LIDOCAINE HCL (PF) 1 % IJ SOLN
INTRAMUSCULAR | Status: AC
  Filled 2020-08-18: qty 30

## 2020-08-18 MED ORDER — LISINOPRIL 10 MG PO TABS: 10.0000 mg | ORAL_TABLET | Freq: Every day | ORAL | Status: DC

## 2020-08-18 MED ORDER — MIDAZOLAM HCL 2 MG/2ML IJ SOLN
INTRAMUSCULAR | Status: AC
  Filled 2020-08-18: qty 2

## 2020-08-18 MED ORDER — HEPARIN BOLUS VIA INFUSION
3000.0000 [IU] | Freq: Once | INTRAVENOUS | Status: AC
  Administered 2020-08-18: 3000 [IU] via INTRAVENOUS
  Filled 2020-08-18: qty 3000

## 2020-08-18 MED ORDER — FENTANYL CITRATE (PF) 100 MCG/2ML IJ SOLN
INTRAMUSCULAR | Status: DC | PRN
  Administered 2020-08-18 (×2): 25 ug via INTRAVENOUS

## 2020-08-18 MED ORDER — HEPARIN (PORCINE) IN NACL 1000-0.9 UT/500ML-% IV SOLN
INTRAVENOUS | Status: AC
  Filled 2020-08-18: qty 1000

## 2020-08-18 MED ORDER — INSULIN ASPART 100 UNIT/ML ~~LOC~~ SOLN: 0.0000 [IU] | Freq: Every day | SUBCUTANEOUS | Status: DC

## 2020-08-18 MED ORDER — ATORVASTATIN CALCIUM 80 MG PO TABS
80.0000 mg | ORAL_TABLET | Freq: Every day | ORAL | Status: DC
  Administered 2020-08-18 – 2020-08-28 (×10): 80 mg via ORAL
  Filled 2020-08-18 (×10): qty 1

## 2020-08-18 MED ORDER — SODIUM CHLORIDE 0.9 % IV SOLN
500.0000 mg | Freq: Once | INTRAVENOUS | Status: DC
  Filled 2020-08-18: qty 500

## 2020-08-18 MED ORDER — NITROGLYCERIN 1 MG/10 ML FOR IR/CATH LAB
INTRA_ARTERIAL | Status: AC
  Filled 2020-08-18: qty 10

## 2020-08-18 MED ORDER — HEPARIN BOLUS VIA INFUSION
4000.0000 [IU] | Freq: Once | INTRAVENOUS | Status: AC
  Administered 2020-08-18: 4000 [IU] via INTRAVENOUS
  Filled 2020-08-18: qty 4000

## 2020-08-18 MED ORDER — INSULIN ASPART 100 UNIT/ML ~~LOC~~ SOLN
4.0000 [IU] | Freq: Three times a day (TID) | SUBCUTANEOUS | Status: DC
  Administered 2020-08-18 – 2020-08-21 (×10): 4 [IU] via SUBCUTANEOUS

## 2020-08-18 MED ORDER — FUROSEMIDE 10 MG/ML IJ SOLN
INTRAMUSCULAR | Status: AC
  Filled 2020-08-18: qty 4

## 2020-08-18 MED ORDER — VERAPAMIL HCL 2.5 MG/ML IV SOLN
INTRAVENOUS | Status: AC
  Filled 2020-08-18: qty 2

## 2020-08-18 MED ORDER — HEPARIN SODIUM (PORCINE) 1000 UNIT/ML IJ SOLN
INTRAMUSCULAR | Status: AC
  Filled 2020-08-18: qty 1

## 2020-08-18 MED ORDER — AZITHROMYCIN 500 MG PO TABS
500.0000 mg | ORAL_TABLET | Freq: Every day | ORAL | Status: DC
  Administered 2020-08-18: 500 mg via ORAL
  Filled 2020-08-18: qty 1

## 2020-08-18 MED ORDER — INSULIN ASPART 100 UNIT/ML ~~LOC~~ SOLN
0.0000 [IU] | Freq: Three times a day (TID) | SUBCUTANEOUS | Status: DC
  Administered 2020-08-18 (×2): 2 [IU] via SUBCUTANEOUS
  Administered 2020-08-18 – 2020-08-19 (×2): 3 [IU] via SUBCUTANEOUS
  Administered 2020-08-19 – 2020-08-20 (×3): 2 [IU] via SUBCUTANEOUS
  Administered 2020-08-20 – 2020-08-21 (×3): 3 [IU] via SUBCUTANEOUS
  Administered 2020-08-21: 4 [IU] via SUBCUTANEOUS
  Administered 2020-08-21 – 2020-08-22 (×2): 3 [IU] via SUBCUTANEOUS

## 2020-08-18 MED ORDER — METOPROLOL TARTRATE 25 MG PO TABS
25.0000 mg | ORAL_TABLET | Freq: Two times a day (BID) | ORAL | Status: DC
  Administered 2020-08-18 – 2020-08-21 (×8): 25 mg via ORAL
  Filled 2020-08-18 (×8): qty 1

## 2020-08-18 MED ORDER — LOSARTAN POTASSIUM 25 MG PO TABS
12.5000 mg | ORAL_TABLET | Freq: Every day | ORAL | Status: DC
  Administered 2020-08-18 – 2020-08-21 (×4): 12.5 mg via ORAL
  Filled 2020-08-18 (×4): qty 1

## 2020-08-18 MED ORDER — DILTIAZEM HCL-DEXTROSE 125-5 MG/125ML-% IV SOLN (PREMIX)
5.0000 mg/h | INTRAVENOUS | Status: DC
  Administered 2020-08-18: 5 mg/h via INTRAVENOUS
  Filled 2020-08-18: qty 125

## 2020-08-18 MED ORDER — LIDOCAINE HCL (PF) 1 % IJ SOLN
INTRAMUSCULAR | Status: DC | PRN
  Administered 2020-08-18: 2 mL

## 2020-08-18 MED ORDER — ASPIRIN 81 MG PO CHEW
CHEWABLE_TABLET | ORAL | Status: AC
  Filled 2020-08-18: qty 4

## 2020-08-18 MED ORDER — ASPIRIN EC 81 MG PO TBEC
81.0000 mg | DELAYED_RELEASE_TABLET | Freq: Every day | ORAL | Status: DC
  Administered 2020-08-19 – 2020-08-21 (×3): 81 mg via ORAL
  Filled 2020-08-18 (×3): qty 1

## 2020-08-18 MED ORDER — FENTANYL CITRATE (PF) 100 MCG/2ML IJ SOLN
INTRAMUSCULAR | Status: AC
  Filled 2020-08-18: qty 2

## 2020-08-18 MED ORDER — DILTIAZEM LOAD VIA INFUSION
10.0000 mg | Freq: Once | INTRAVENOUS | Status: AC
  Administered 2020-08-18: 10 mg via INTRAVENOUS
  Filled 2020-08-18: qty 10

## 2020-08-18 MED ORDER — ASPIRIN 81 MG PO CHEW
CHEWABLE_TABLET | ORAL | Status: DC | PRN
  Administered 2020-08-18: 324 mg via ORAL

## 2020-08-18 MED ORDER — MIDAZOLAM HCL 2 MG/2ML IJ SOLN
INTRAMUSCULAR | Status: DC | PRN
  Administered 2020-08-18 (×2): 1 mg via INTRAVENOUS

## 2020-08-18 MED ORDER — SODIUM CHLORIDE 0.9% FLUSH
3.0000 mL | Freq: Two times a day (BID) | INTRAVENOUS | Status: DC
  Administered 2020-08-18 – 2020-08-20 (×5): 3 mL via INTRAVENOUS

## 2020-08-18 MED ORDER — LABETALOL HCL 5 MG/ML IV SOLN: 10.0000 mg | INTRAVENOUS | Status: AC | PRN

## 2020-08-18 MED ORDER — ORAL CARE MOUTH RINSE
15.0000 mL | Freq: Two times a day (BID) | OROMUCOSAL | Status: DC
  Administered 2020-08-21 (×2): 15 mL via OROMUCOSAL

## 2020-08-18 MED ORDER — NITROGLYCERIN 0.4 MG SL SUBL
0.4000 mg | SUBLINGUAL_TABLET | SUBLINGUAL | Status: DC | PRN
Start: 2020-08-18 — End: 2020-08-22

## 2020-08-18 MED ORDER — SODIUM CHLORIDE 0.9% FLUSH: 3.0000 mL | INTRAVENOUS | Status: DC | PRN

## 2020-08-18 MED ORDER — DOXYCYCLINE HYCLATE 100 MG PO TABS
100.0000 mg | ORAL_TABLET | Freq: Two times a day (BID) | ORAL | Status: DC
  Administered 2020-08-18: 100 mg via ORAL
  Filled 2020-08-18: qty 1

## 2020-08-18 MED ORDER — SODIUM CHLORIDE 0.9 % IV SOLN: 1.0000 g | INTRAVENOUS | Status: DC

## 2020-08-18 MED ORDER — HEPARIN (PORCINE) 25000 UT/250ML-% IV SOLN
1700.0000 [IU]/h | INTRAVENOUS | Status: DC
  Administered 2020-08-18: 1400 [IU]/h via INTRAVENOUS
  Filled 2020-08-18: qty 250

## 2020-08-18 SURGICAL SUPPLY — 12 items
CATH 5FR JL3.5 JR4 ANG PIG MP (CATHETERS) ×1 IMPLANT
CATH INFINITI 5 FR 3DRC (CATHETERS) ×1 IMPLANT
CATH INFINITI 5FR AL1 (CATHETERS) ×1 IMPLANT
DEVICE RAD COMP TR BAND LRG (VASCULAR PRODUCTS) ×1 IMPLANT
GLIDESHEATH SLEND SS 6F .021 (SHEATH) ×1 IMPLANT
GUIDEWIRE INQWIRE 1.5J.035X260 (WIRE) IMPLANT
INQWIRE 1.5J .035X260CM (WIRE) ×2
KIT ENCORE 26 ADVANTAGE (KITS) ×1 IMPLANT
KIT HEART LEFT (KITS) ×2 IMPLANT
PACK CARDIAC CATHETERIZATION (CUSTOM PROCEDURE TRAY) ×2 IMPLANT
TRANSDUCER W/STOPCOCK (MISCELLANEOUS) ×2 IMPLANT
TUBING CIL FLEX 10 FLL-RA (TUBING) ×2 IMPLANT

## 2020-08-18 NOTE — Progress Notes (Addendum)
ANTICOAGULATION CONSULT NOTE   Pharmacy Consult for Heparin Indication: chest pain/ACS and atrial fibrillation  No Known Allergies  Patient Measurements: Height: '5\' 9"'  (175.3 cm) Weight: 105.8 kg (233 lb 4 oz) IBW/kg (Calculated) : 70.7 Heparin Dosing Weight: 90 kg  Vital Signs: Temp: 98.8 F (37.1 C) (12/18 1112) Temp Source: Oral (12/18 1112) BP: 118/79 (12/18 1112) Pulse Rate: 78 (12/18 1112)  Labs: Recent Labs    08/18/20 0100 08/18/20 0339 08/18/20 0937  HGB 14.6  --   --   HCT 44.7  --   --   PLT 279  --   --   APTT 26  --   --   LABPROT 12.9  --   --   INR 1.0  --   --   HEPARINUNFRC  --   --  <0.10*  CREATININE 1.04  --   --   TROPONINIHS 1,826* 5,960*  --     Estimated Creatinine Clearance: 97.3 mL/min (by C-G formula based on SCr of 1.04 mg/dL).   Medical History: Past Medical History:  Diagnosis Date  . Diabetes mellitus without complication ([place])   . Hypertension   . Sleep apnea     Medications:  No current facility-administered medications on file prior to encounter.   Current Outpatient Medications on File Prior to Encounter  Medication Sig Dispense Refill  . amLODipine (NORVASC) 5 MG tablet Take 1 tablet (5 mg total) by mouth daily. 30 tablet 6  . atorvastatin (LIPITOR) 20 MG tablet Take 1 tablet (20 mg total) by mouth daily. Stop Pravastatin 30 tablet 6  . Blood Glucose Monitoring Suppl (TRUE METRIX METER) w/Device KIT Use to check blood sugar as instructed. 1 kit 0  . Dulaglutide (TRULICITY) 1.5 NG/2.9BM SOPN Inject 1.5 mg into the skin once a week. 15 mL 2  . glipiZIDE (GLUCOTROL) 5 MG tablet TAKE 1 TABLET BY MOUTH 2 (TWO) TIMES DAILY BEFORE A MEAL. 60 tablet 6  . glucose blood (TRUE METRIX BLOOD GLUCOSE TEST) test strip Use as instructed to check blood sugar daily. 100 each 11  . lisinopril (ZESTRIL) 20 MG tablet Take 1 tablet (20 mg total) by mouth daily. 30 tablet 6  . metFORMIN (GLUCOPHAGE) 1000 MG tablet Take 1 tablet (1,000 mg total)  by mouth 2 (two) times daily with a meal. 60 tablet 6  . sildenafil (VIAGRA) 100 MG tablet Take 0.5-1 tablets (50-100 mg total) by mouth daily as needed for erectile dysfunction. 5 tablet 3  . TRUEPLUS LANCETS 28G MISC Use as instructed to check blood sugar daily. 100 each 11     Assessment: 54 y.o. male with chest pain, new onset Afib and elevated troponin. Pharmacy asked to start heparin. No AC PTA.  Heparin level undetectable on drip rate 1400 units/hr. CBC prior to starting heparin was WNL. Troponin continues to rise. No overt bleeding or infusion issues per discussion with nursing.   Goal of Therapy:  Heparin level 0.3-0.7 units/ml Monitor platelets by anticoagulation protocol: Yes   Plan:  Heparin 3000 units IV bolus, then increase heparin infusion to 1700 units/hr Check heparin level in 6 hours. Monitor daily HL, CBC ,s/sx bleeding  Richardine Service, PharmD, BCPS PGY2 Cardiology Pharmacy Resident Phone: [phone] 08/18/2020  11:25 AM ============================ Addendum: Patient now s/p urgent LHC for NSTEMI and heparin infusion held for cath. 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 Pharmacy asked to restart heparin infusion 8 hours after sheath removal. Sheath removed ~1430 this afternoon.  Plan:  Restart heparin infusion 1700 units/hr  at 2230 tonight Check 6-hr HL after restarting Monitor daily HL, CBC, s/sx bleeding  Richardine Service, PharmD, BCPS PGY2 Cardiology Pharmacy Resident Phone: [phone] 08/18/2020  2:33 PM  Please check [email or web address] for unit-specific pharmacy phone numbers.

## 2020-08-18 NOTE — Consult Note (Signed)
Medical Consultation   Johnny Nichols  HAL:[id number]  DOB: 1966/07/31  DOA: 08/18/2020  PCP: Johnny Matar, MD   Outpatient Specialists: None   Requesting physician: Johnny Nichols - cardiology  Reason for consultation: ?PNA - leukocytosis, abnl CXR, started on Doxy.  Admitted for afib with RVR.   History of Present Illness: Johnny Nichols is an 54 y.o. male with h/o OSA; HTN; and DM presenting with new-onset afib with RVR and NSTEMI.  He reports that in the last week or so he has noticed mild SOB with significant exertion (lifting heavy boxes at work).  Yesterday, he felt chest tightness and so went to EMS about 3pm for evaluation; BP was normal and he went back to work.  About 10pm, he felt chest tightness and acute SOB with coughing.  It lasted for about an hour and he eventually called 911.  He did have mild nasal congestion earlier in the week but no fevers and no other symptoms other than as above.  He works 60+ hours a week.    Review of Systems:  ROS As per HPI otherwise 10 point review of systems negative.    Past Medical History: Past Medical History:  Diagnosis Date  . Diabetes mellitus without complication (HCC)   . Hypertension   . Sleep apnea     Past Surgical History: History reviewed. No pertinent surgical history.   Allergies:  No Known Allergies   Social History:  reports that he has never smoked. He has never used smokeless tobacco. He reports that he does not drink alcohol and does not use drugs.   Family History: Family History  Problem Relation Age of Onset  . Heart disease Mother   . Cancer Father   . Hypertension Other   . Obesity Other       Physical Exam: Vitals:   08/18/20 0619 08/18/20 0700 08/18/20 0752 08/18/20 1112  BP: 118/84  119/87 118/79  Pulse: 77 84 81 78  Resp: 17 19 (!) 22 18  Temp: 98.2 F (36.8 C)  98.5 F (36.9 C) 98.8 F (37.1 C)  TempSrc: Oral  Oral Oral  SpO2: 95% 94% 95% 94%   Weight: 105.8 kg     Height: 5\' 9"  (1.753 m)       Constitutional: Alert and awake, oriented x3, not in any acute distress.  In the process of getting echo. Eyes:  EOMI, irises appear normal, anicteric sclera  ENMT: external ears and nose appear normal, normal hearing, Lips appear normal Neck: neck appears normal, no masses, normal ROM CVS: S1-S2 clear, no murmur rubs or gallops, no LE edema, normal pedal pulses  Respiratory:  clear to auscultation bilaterally, no wheezing, rales or rhonchi. Respiratory effort normal. No accessory muscle use.  Abdomen: soft nontender, nondistended Musculoskeletal: : no cyanosis, clubbing or edema noted bilaterally Neuro: Cranial nerves II-XII intact, strength, sensation, reflexes Psych: judgement and insight appear normal, stable mood and affect, mental status Skin: no rashes or lesions or ulcers, no induration or nodules    Data reviewed:  I have personally reviewed the recent labs and imaging studies  Pertinent Labs:   Glucose 185 BNP 85.3 HS troponin 1826 -> 5960 -> 16847 WBC 17.0 INR 1.0 COVID/flu negative Lipids: 163/40/110/63   Inpatient Medications:   Scheduled Meds: . [START ON 08/19/2020] aspirin EC  81 mg Oral Daily  . atorvastatin  80 mg Oral Daily  . azithromycin  500 mg Oral Daily  . doxycycline  100 mg Oral Q12H  . insulin aspart  0-15 Units Subcutaneous TID WC  . insulin aspart  0-5 Units Subcutaneous QHS  . insulin aspart  4 Units Subcutaneous TID WC  . [START ON 08/19/2020] lisinopril  10 mg Oral Daily  . mouth rinse  15 mL Mouth Rinse BID  . metoprolol tartrate  25 mg Oral BID   Continuous Infusions: . [START ON 08/19/2020] cefTRIAXone (ROCEPHIN)  IV    . heparin 1,700 Units/hr (08/18/20 1154)     Radiological Exams on Admission: DG Chest Port 1 View  Result Date: 08/18/2020 CLINICAL DATA:  Shortness of breath. EXAM: PORTABLE CHEST 1 VIEW COMPARISON:  March 15, 2013 FINDINGS: Mild to moderate severity diffusely  increased interstitial lung markings are seen. There is no evidence of a pleural effusion or pneumothorax. The heart size and mediastinal contours are within normal limits. The visualized skeletal structures are unremarkable. IMPRESSION: Mild to moderate severity diffuse interstitial lung disease with a suspected superimposed component of interstitial edema. Electronically Signed   By: Aram Candela M.D.   On: 08/18/2020 01:51    Impression/Recommendations Principal Problem:   NSTEMI (non-ST elevated myocardial infarction) (HCC) Active Problems:   OSA (obstructive sleep apnea)   Diabetes mellitus without complication (HCC)   Essential hypertension   Hyperlipidemia   Obesity (BMI 30.0-34.9)   Pneumonia   Atrial fibrillation with RVR (HCC)  NSTEMI -Mild intermittent DOE in the week prior to event -Developed acute chest discomfort and SOB with cough yesterday -Uptrending troponins concerning for ACS -Management per cardiology, but appears to need catheterization  Afib with RVR -New onset afib with RVR -Has since converted to NSR -Possibly ischemic in nature -While PNA as a trigger was considered, this seems less likely as the etiology at this time  Possible PNA -I do not think he has PNA at this time -Clinical history is not c/w PNA and CXR is also not suspicious for this -Procalcitonin ordered -Chest CT is pending -If CT and procalcitonin are negative, would suggest discontinuation of antibiotics -Based on low likelihood for infection at this time, [facility] will sign off.  Please reconsult if needed.  DM -Will check A1c; was 6.5 in September -hold Glucophage, Glucotrol, Trulicity -Soil scientist with moderate-scale SSI  HTN -Continue Lisinopril -Add BB   HLD -Continue Lipitor -LDL goal <70  OSA -Likely to benefit from CPAP if not already on  Obesity -Body mass index is 34.44 kg/m..  -Weight loss should be encouraged -Outpatient PCP/bariatric medicine f/u  encouraged   Thank you for this consultation.  Our [facility] hospitalist team will sign off.  Please reconsult if needed.   Time Spent: 45 minutes  Jonah Blue M.D. [facility] 08/18/2020, 12:13 PM

## 2020-08-18 NOTE — Progress Notes (Signed)
  Echocardiogram 2D Echocardiogram has been performed.  Johnny Nichols 08/18/2020, 9:46 AM

## 2020-08-18 NOTE — Progress Notes (Signed)
[address]       [place] [address]             [phone]        Zebulen Cryan [place] Medical Record #[id number] Date of Birth: 1966-02-07  Referring: Dr Audie Box  Primary Care: [name], MD Primary Cardiologist:No primary care provider on file.  Chief Complaint:    Chief Complaint  Patient presents with  . 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  Troponins were markedly elevated, CT scan showed fluffy infiltrates throughout both lung fields.   The patient had been having progressive symptoms over the past several days, went to the EMS station and had an EKG earlier the day before and ultimately went back to work.   Cardiac catheterization was done today-demonstrated left main disease, left dominant system with three-vessel disease.  Ventriculogram was not done due to markedly elevated end-diastolic pressures.  Cardiac catheterization notes no evidence of aortic stenosis-echocardiogram suggests decreased LV function 50% with mild to moderate aortic stenosis, valve area 1.0.  Currently the patient is resting in bed notes his respiratory status is improved-is no longer coughing up pain sputum.   Current Activity/ Functional Status: Patient is independent with mobility/ambulation, transfers, ADL's, IADL's.   Zubrod Score: At the time of surgery this patient's most appropriate activity status/level should be described as: '[x]'     0    Normal activity, no symptoms '[]'     1    Restricted in physical strenuous activity but ambulatory, able to do out light work '[]'     2    Ambulatory and capable of self care, unable to do work activities, up and about                  more than 50%  Of the time                            '[]'     3    Only limited self care, in bed greater than 50% of waking hours '[]'     4    Completely disabled, no self care, confined to bed or chair '[]'     5    Moribund  Past Medical History:  Diagnosis Date  . Diabetes mellitus without complication ([place])   . Hypertension   . Sleep apnea    Patient had previous history of being admitted with severe diabetic ketoacidosis glucose is over thousand 7 years ago-since that time is been on oral agent for diabetes control, most recent hemoglobin A1c 7.4.  Social History   Tobacco Use  Smoking Status Never Smoker  Smokeless Tobacco Never Used    Social History   Substance and Sexual Activity  Alcohol Use No   Comment: no drink x 3 weeks     No Known Allergies  Current Facility-Administered Medications  Medication Dose Route Frequency Provider Last Rate Last Admin  . 0.9 %  sodium chloride infusion  250 mL Intravenous PRN Jettie Booze, MD      . acetaminophen (TYLENOL) tablet 650 mg  650 mg Oral Q4H PRN Jettie Booze, MD      . [  START ON 08/19/2020] aspirin EC tablet 81 mg  81 mg Oral Daily Jettie Booze, MD      . atorvastatin (LIPITOR) tablet 80 mg  80 mg Oral Daily Jettie Booze, MD   80 mg at 08/18/20 1000  . heparin ADULT infusion 100 units/mL (25000 units/237m sodium chloride 0.45%)  1,700 Units/hr Intravenous Continuous BEinar Grad RPH      . hydrALAZINE (APRESOLINE) injection 10 mg  10 mg Intravenous Q20 Min PRN VLarae GroomsS, MD      . insulin aspart (novoLOG) injection 0-15 Units  0-15 Units Subcutaneous TID WC VJettie Booze MD   2 Units at 08/18/20 1703  . insulin aspart (novoLOG) injection 0-5 Units  0-5 Units Subcutaneous QHS VLarae GroomsS, MD      . insulin aspart (novoLOG) injection 4 Units  4 Units Subcutaneous TID WC VJettie Booze MD   4 Units at 08/18/20 1704  . labetalol (NORMODYNE) injection 10 mg   10 mg Intravenous Q10 min PRN VJettie Booze MD      . losartan (COZAAR) tablet 12.5 mg  12.5 mg Oral Daily OGeralynn Rile MD   12.5 mg at 08/18/20 1703  . MEDLINE mouth rinse  15 mL Mouth Rinse BID VJettie Booze MD      . metoprolol tartrate (LOPRESSOR) tablet 25 mg  25 mg Oral BID VJettie Booze MD   25 mg at 08/18/20 0959  . nitroGLYCERIN (NITROSTAT) SL tablet 0.4 mg  0.4 mg Sublingual Q5 Min x 3 PRN VJettie Booze MD      . ondansetron ([facility] injection 4 mg  4 mg Intravenous Q6H PRN VLarae GroomsS, MD      . sodium chloride flush (NS) 0.9 % injection 3 mL  3 mL Intravenous Q12H VJettie Booze MD   3 mL at 08/18/20 1704  . sodium chloride flush (NS) 0.9 % injection 3 mL  3 mL Intravenous PRN VJettie Booze MD      . spironolactone (ALDACTONE) tablet 12.5 mg  12.5 mg Oral Daily O'Neal, WCassie Freer MD   12.5 mg at 08/18/20 1703    Medications Prior to Admission  Medication Sig Dispense Refill Last Dose  . amLODipine (NORVASC) 5 MG tablet Take 1 tablet (5 mg total) by mouth daily. 30 tablet 6   . atorvastatin (LIPITOR) 20 MG tablet Take 1 tablet (20 mg total) by mouth daily. Stop Pravastatin 30 tablet 6   . Blood Glucose Monitoring Suppl (TRUE METRIX METER) w/Device KIT Use to check blood sugar as instructed. 1 kit 0   . Dulaglutide (TRULICITY) 1.5 MBH/4.1PFSOPN Inject 1.5 mg into the skin once a week. 15 mL 2   . glipiZIDE (GLUCOTROL) 5 MG tablet TAKE 1 TABLET BY MOUTH 2 (TWO) TIMES DAILY BEFORE A MEAL. 60 tablet 6   . glucose blood (TRUE METRIX BLOOD GLUCOSE TEST) test strip Use as instructed to check blood sugar daily. 100 each 11   . lisinopril (ZESTRIL) 20 MG tablet Take 1 tablet (20 mg total) by mouth daily. 30 tablet 6   . metFORMIN (GLUCOPHAGE) 1000 MG tablet Take 1 tablet (1,000 mg total) by mouth 2 (two) times daily with a meal. 60 tablet 6   . sildenafil (VIAGRA) 100 MG tablet Take 0.5-1 tablets (50-100 mg total) by mouth  daily as needed for erectile dysfunction. 5 tablet 3   . TRUEPLUS LANCETS 28G MISC Use as instructed to check blood sugar  daily. 100 each 11     Family History  Problem Relation Age of Onset  . Heart disease Mother   . Cancer Father   . Hypertension Other   . Obesity Other      Review of Systems:   Pertinent items are noted in HPI.     Cardiac Review of Systems: Y or  [    ]= no  Chest Pain [ y  ]  Resting SOB [ y  ] Exertional SOB  Blue.Reese  ]  Orthopnea Blue.Reese ]   Pedal Edema [ n  ]    Palpitations [ n ] Syncope  [ n ]   Presyncope [ n  ]  General Review of Systems: [Y] = yes [  ]=no Constitional: recent weight change [  ]; anorexia [  ]; fatigue [  ]; nausea [  ]; night sweats [  ]; fever [  ]; or chills [  ]                                                               Dental: Last Dentist visit:   Eye : blurred vision [  ]; diplopia [   ]; vision changes [  ];  Amaurosis fugax[  ]; Resp: cough Blue.Reese  ];  wheezing[y  ];  hemoptysis[  ]; shortness of breath[ y ]; paroxysmal nocturnal dyspnea[  ]; dyspnea on exertion[ y ]; or orthopnea[  ];  GI:  gallstones[  ], vomiting[  ];  dysphagia[  ]; melena[  ];  hematochezia [  ]; heartburn[  ];   Hx of  Colonoscopy[  ]; GU: kidney stones [  ]; hematuria[  ];   dysuria [  ];  nocturia[  ];  history of     obstruction [  ]; urinary frequency [  ]             Skin: rash, swelling[  ];, hair loss[  ];  peripheral edema[  ];  or itching[  ]; Musculosketetal: myalgias[  ];  joint swelling[  ];  joint erythema[  ];  joint pain[  ];  back pain[  ];  Heme/Lymph: bruising[  ];  bleeding[  ];  anemia[  ];  Neuro: TIA[  ];  headaches[  ];  stroke[  ];  vertigo[  ];  seizures[  ];   paresthesias[  ];  difficulty walking[  ];  Psych:depression[  ]; anxiety[  ];  Endocrine: diabetes[  ];  thyroid dysfunction[  ];               Physical Exam: BP 102/75 (BP Location: Left Arm)   Pulse 78   Temp 98.4 F (36.9 C) (Oral)   Resp 18   Ht '5\' 9"'  (1.753 m)   Wt  105.8 kg   SpO2 96%   BMI 34.44 kg/m    General appearance: alert, cooperative, appears stated age and no distress Head: Normocephalic, without obvious abnormality, atraumatic Neck: no adenopathy, no carotid bruit, no JVD, supple, symmetrical, trachea midline and thyroid not enlarged, symmetric, no tenderness/mass/nodules Lymph nodes: Cervical, supraclavicular, and axillary nodes normal. Resp: diminished breath sounds bilaterally Cardio: regular rate and rhythm, S1, S2 normal, no murmur, click, rub or gallop GI: soft, non-tender; bowel sounds normal;  no masses,  no organomegaly Extremities: extremities normal, atraumatic, no cyanosis or edema, Homans sign is negative, no sign of DVT and Cath site right radial artery Neurologic: Grossly normal  Diagnostic Studies & Laboratory data:     Recent Radiology Findings:   CT CHEST WO CONTRAST  Result Date: 08/18/2020 CLINICAL DATA:  Respiratory failure. EXAM: CT CHEST WITHOUT CONTRAST TECHNIQUE: Multidetector CT imaging of the chest was performed following the standard protocol without IV contrast. COMPARISON:  Chest x-ray August 18, 2020 FINDINGS: Cardiovascular: Three-vessel coronary artery disease identified. The heart size is within normal limits. The thoracic aorta demonstrates very mild calcified atherosclerosis. No aneurysm. The central pulmonary arteries are normal in caliber. Mediastinum/Nodes: No enlarged mediastinal or axillary lymph nodes. Thyroid gland, trachea, and esophagus demonstrate no significant findings. Lungs/Pleura: Central airways are normal. Diffuse patchy ground-glass opacities affecting all lobes. Interlobular septal thickening identified, particularly in the bases. The ground-glass opacities are somewhat nodular in appearance in the bases, particularly on the right. No suspicious nodules or masses although evaluation is limited due to the ground-glass opacities. Mild atelectasis seen dependently in the bases, left greater  than right. Upper Abdomen: No acute abnormality. Musculoskeletal: No chest wall mass or suspicious bone lesions identified. IMPRESSION: 1. Diffuse ground-glass opacities in the lungs. Diffuse interlobular septal thickening, particularly in the bases. The findings are favored to represent pulmonary edema. Atypical infection considered less likely. Recommend clinical correlation. 2. Three-vessel coronary artery disease. 3. Mild atherosclerotic change in the thoracic aorta. Aortic Atherosclerosis (ICD10-I70.0). Electronically Signed   By: Dorise Bullion III M.D   On: 08/18/2020 12:18   CARDIAC CATHETERIZATION  Result Date: 08/18/2020  Dist LM to Ost LAD lesion is 80% stenosed.  Mid Cx lesion is 80% stenosed.  Ost Cx lesion is 75% stenosed.  Prox RCA lesion is 80% stenosed.  LV end diastolic pressure is moderately elevated. LVEDP 24 mm Hg.  There is no aortic valve stenosis.  Severe left main disease.  Plan for surgical consult.  IV Lasix for increased LVEDP. Aggressive secondary prevention.   DG Chest Port 1 View  Result Date: 08/18/2020 CLINICAL DATA:  Shortness of breath. EXAM: PORTABLE CHEST 1 VIEW COMPARISON:  March 15, 2013 FINDINGS: Mild to moderate severity diffusely increased interstitial lung markings are seen. There is no evidence of a pleural effusion or pneumothorax. The heart size and mediastinal contours are within normal limits. The visualized skeletal structures are unremarkable. IMPRESSION: Mild to moderate severity diffuse interstitial lung disease with a suspected superimposed component of interstitial edema. Electronically Signed   By: Virgina Norfolk M.D.   On: 08/18/2020 01:51   ECHOCARDIOGRAM COMPLETE  Result Date: 08/18/2020    ECHOCARDIOGRAM REPORT   Patient Name:   LOCKE BARRELL Date of Exam: 08/18/2020 Medical Rec #:  [id number]         Height:       69.0 in Accession #:    [id number]        Weight:       233.2 lb Date of Birth:  11-14-1965        BSA:          2.206  m Patient Age:    30 years          BP:           119/87 mmHg Patient Gender: M                 HR:  88 bpm. Exam Location:  Inpatient Procedure: 2D Echo, 3D Echo, Cardiac Doppler, Color Doppler and Strain Analysis Indications:    121-121.4 ST elevation (STEMI) and non-ST elevation (NSTEMI)                 myocardial infarction  History:        Patient has no prior history of Echocardiogram examinations.                 Acute MI, Signs/Symptoms:Chest Pain and Dyspnea; Risk                 Factors:Hypertension, Diabetes, Dyslipidemia and Sleep Apnea.  Sonographer:    Roseanna Rainbow RDCS Referring Phys: [id number] SCOTT W ROSE  Sonographer Comments: Technically difficult study due to poor echo windows. Global longitudinal strain was attempted. IMPRESSIONS  1. Left ventricular ejection fraction, by estimation, is 45 to 50%. The left ventricle has mildly decreased function. The basal-to-mid lateral wall appears moderately hypokinetic based on limited views. The rest of the LV segments appear mildly hypokinetic.There is mild concentric left ventricular hypertrophy. Left ventricular diastolic parameters are consistent with Grade II diastolic dysfunction (pseudonormalization).  2. Right ventricular systolic function is normal. The right ventricular size is normal.  3. The mitral valve is degenerative. Mild to moderate mitral valve regurgitation. Moderate mitral annular calcification.  4. The aortic valve is tricuspid. There is moderate calcification of the aortic valve. There is moderate thickening of the aortic valve. Aortic valve regurgitation is not visualized. There is moderate aortic stenosis with AVA 1.0cm2, mean gradient 37mHg, peak velocity 2.773m, DOI 0.34. LVOT VTI 18.  5. The inferior vena cava is normal in size with greater than 50% respiratory variability, suggesting right atrial pressure of 3 mmHg. FINDINGS  Left Ventricle: Left ventricular ejection fraction, by estimation, is 45 to 50%. The left ventricle  has mildly decreased function. The basal-to-mid lateral LV sgements appear moderately hypokinetic based on limited views. The rest of the LV segments are  mildly hypokinetic. The left ventricular internal cavity size was normal in size. There is mild concentric left ventricular hypertrophy. Left ventricular diastolic parameters are consistent with Grade II diastolic dysfunction (pseudonormalization). Right Ventricle: The right ventricular size is normal. No increase in right ventricular wall thickness. Right ventricular systolic function is normal. Left Atrium: Left atrial size was normal in size. Right Atrium: Right atrial size was normal in size. Pericardium: There is no evidence of pericardial effusion. Mitral Valve: The mitral valve is degenerative in appearance. There is moderate thickening of the mitral valve leaflet(s). There is moderate calcification of the mitral valve leaflet(s). Moderate mitral annular calcification. Mild to moderate mitral valve regurgitation. Tricuspid Valve: The tricuspid valve is normal in structure. Tricuspid valve regurgitation is trivial. Aortic Valve: The aortic valve is calcified. There is moderate calcification of the aortic valve. There is moderate thickening of the aortic valve. Aortic valve regurgitation is not visualized. There is moderate aortic stenosis with AVA 1cm2, mean gradient 1766m, peak velocity 2.7m72mDOI 0.34. LVOT VTI 18. Pulmonic Valve: The pulmonic valve was normal in structure. Pulmonic valve regurgitation is trivial. Aorta: The aortic root and ascending aorta are structurally normal, with no evidence of dilitation. Venous: The inferior vena cava is normal in size with greater than 50% respiratory variability, suggesting right atrial pressure of 3 mmHg. IAS/Shunts: No atrial level shunt detected by color flow Doppler.  LEFT VENTRICLE PLAX 2D LVIDd:         4.80 cm  Diastology LVIDs:         3.80 cm      LV e' medial:    7.62 cm/s LV PW:         1.30 cm       LV E/e' medial:  14.4 LV IVS:        1.30 cm      LV e' lateral:   6.42 cm/s LVOT diam:     1.90 cm      LV E/e' lateral: 17.1 LV SV:         51 LV SV Index:   23 LVOT Area:     2.84 cm                              3D Volume EF: LV Volumes (MOD)            3D EF:        50 % LV vol d, MOD A2C: 88.1 ml  LV EDV:       168 ml LV vol d, MOD A4C: 103.0 ml LV ESV:       84 ml LV vol s, MOD A2C: 46.8 ml  LV SV:        84 ml LV vol s, MOD A4C: 56.1 ml LV SV MOD A2C:     41.3 ml LV SV MOD A4C:     103.0 ml LV SV MOD BP:      49.5 ml RIGHT VENTRICLE             IVC RV S prime:     13.10 cm/s  IVC diam: 1.80 cm TAPSE (M-mode): 2.3 cm LEFT ATRIUM             Index       RIGHT ATRIUM           Index LA diam:        4.20 cm 1.90 cm/m  RA Area:     10.60 cm LA Vol (A2C):   53.1 ml 24.08 ml/m RA Volume:   21.00 ml  9.52 ml/m LA Vol (A4C):   43.3 ml 19.63 ml/m LA Biplane Vol: 51.3 ml 23.26 ml/m  AORTIC VALVE AV Area (Vmax):    1.75 cm AV Area (Vmean):   1.69 cm AV Area (VTI):     0.96 cm AV Vmax:           274.50 cm/s AV Vmean:          191.500 cm/s AV VTI:            0.534 m AV Peak Grad:      30.1 mmHg AV Mean Grad:      17.0 mmHg LVOT Vmax:         169.00 cm/s LVOT Vmean:        114.000 cm/s LVOT VTI:          0.180 m LVOT/AV VTI ratio: 0.34  AORTA Ao Root diam: 3.20 cm Ao Asc diam:  2.80 cm MITRAL VALVE MV Area (PHT): 5.02 cm      SHUNTS MV Decel Time: 151 msec      Systemic VTI:  0.18 m MR Peak grad:    114.5 mmHg  Systemic Diam: 1.90 cm MR Mean grad:    72.0 mmHg MR Vmax:         535.00 cm/s MR Vmean:        389.0 cm/s MR  PISA:         1.01 cm MR PISA Eff ROA: 7 mm MR PISA Radius:  0.40 cm MV E velocity: 110.00 cm/s MV A velocity: 56.30 cm/s MV E/A ratio:  1.95 Gwyndolyn Kaufman MD Electronically signed by Gwyndolyn Kaufman MD Signature Date/Time: 08/18/2020/12:47:02 PM    Final      I have independently reviewed the above radiologic studies and discussed with the patient   Recent Lab Findings: Lab  Results  Component Value Date   WBC 17.0 (H) 08/18/2020   HGB 14.6 08/18/2020   HCT 44.7 08/18/2020   PLT 279 08/18/2020   GLUCOSE 151 (H) 08/18/2020   CHOL 163 08/18/2020   TRIG 63 08/18/2020   HDL 40 (L) 08/18/2020   LDLCALC 110 (H) 08/18/2020   ALT 24 08/18/2020   AST 38 08/18/2020   NA 139 08/18/2020   K 4.0 08/18/2020   CL 109 08/18/2020   CREATININE 0.87 08/18/2020   BUN 18 08/18/2020   CO2 12 (L) 08/18/2020   TSH 0.906 08/18/2020   INR 1.0 08/18/2020   HGBA1C 7.4 (H) 08/18/2020      Assessment / Plan:   Type 2 diabetes-currently on Metformin Glucotrol Trulicity-hemoglobin A0U 7.4 Hypertension on Zestril and Norvasc Recent upper respiratory symptoms-virtual visit on 12/ 13 note chest pain at that time and was referred to cardiology However came to the emergency room today 12/18 in rapid atrial fibrillation-admitted Elevated WBC  Paroxysmal atrial fib 1 occasion precipitating admission Non-STEMI myocardial infarction Mild to moderate mitral valve regurgitation moderate aortic stenosis with AVA 1.0cm2, mean gradient 15mHg, peak velocity 2.752m, -this is in contrast to findings at cardiac catheterization which revealed "no aortic stenosis" Patient is Covid negative on admission-however his job as a [employer] not been vaccinated increases his risk of Covid-CT scan findings bilateral fluffy pulmonary infiltrates likely secondary to congestive heart failure but could be Covid-we will repeat Covid testing prior to any surgical intervention  With the patient's significant coronary anatomy with diabetes he would best be served with coronary artery bypass grafting-on this admission  We will review with cardiology incongruent findings concerning his aortic stenosis-echo versus cath     EdGrace IsaacD      [address] ,[place] [phone]   08/18/2020 5:51 PM     Patient ID: AnJohathan Provincemale   DOB: [id number].o.   MRN: [id number]

## 2020-08-18 NOTE — Plan of Care (Signed)
Discussed with patient pain management, bedrest orders and admission questions with some teach back displayed.  Problem: Education: Goal: Knowledge of disease or condition will improve Outcome: Progressing   Problem: Activity: Goal: Risk for activity intolerance will decrease Outcome: Progressing   Problem: Pain Managment: Goal: General experience of comfort will improve Outcome: Progressing

## 2020-08-18 NOTE — ED Provider Notes (Signed)
[facility] EMERGENCY DEPARTMENT Provider Note  CSN: [id number] Arrival date & time: 08/18/20 0037    History Chief Complaint  Patient presents with  . Shortness of Breath    HPI  Johnny Nichols is a 54 y.o. male with history of DM and HTN but no known CAD or other heart problems reports about a week of intermittent episodes of chest pressure radiating into his upper back. Associated with mild SOB. Spoke with his doctor last week and referred to Cardiology, scheduled for January. Today he was at work when he had a particularly bad episode of chest pain, improved after resting but his SOB continued. He did some more work but then the pain came back. EMS was called, they found him to be in Afib, RVR rate 150-170, given Cardizem bolus. Also noted to be hypoxic on room air, placed on NRB enroute and titrated down to 4L on arrival. No fever. He has had a cough productive of pink sputum initially, now more brownish yellow. Has not had Covid vaccine.    Past Medical History:  Diagnosis Date  . Diabetes mellitus without complication ([place])   . Hypertension   . Sleep apnea     History reviewed. No pertinent surgical history.  Family History  Problem Relation Age of Onset  . Heart disease Mother   . Cancer Father   . Hypertension Other   . Obesity Other     Social History   Tobacco Use  . Smoking status: Never Smoker  . Smokeless tobacco: Never Used  Substance Use Topics  . Alcohol use: No    Comment: no drink x 3 weeks  . Drug use: No     Home Medications Prior to Admission medications   Medication Sig Start Date End Date Taking? Authorizing Provider  amLODipine (NORVASC) 5 MG tablet Take 1 tablet (5 mg total) by mouth daily. 08/13/20   [name], MD  atorvastatin (LIPITOR) 20 MG tablet Take 1 tablet (20 mg total) by mouth daily. Stop Pravastatin 08/13/20   [name], MD  Blood Glucose Monitoring Suppl (TRUE METRIX METER) w/Device KIT Use to check  blood sugar as instructed. 05/14/20   Camillia Herter, NP  Dulaglutide (TRULICITY) 1.5 IW/5.8KD SOPN Inject 1.5 mg into the skin once a week. 08/13/20   [name], MD  glipiZIDE (GLUCOTROL) 5 MG tablet TAKE 1 TABLET BY MOUTH 2 (TWO) TIMES DAILY BEFORE A MEAL. 08/13/20   [name], MD  glucose blood (TRUE METRIX BLOOD GLUCOSE TEST) test strip Use as instructed to check blood sugar daily. 07/02/18   [name], MD  lisinopril (ZESTRIL) 20 MG tablet Take 1 tablet (20 mg total) by mouth daily. 08/13/20   [name], MD  metFORMIN (GLUCOPHAGE) 1000 MG tablet Take 1 tablet (1,000 mg total) by mouth 2 (two) times daily with a meal. 08/13/20   [name], MD  sildenafil (VIAGRA) 100 MG tablet Take 0.5-1 tablets (50-100 mg total) by mouth daily as needed for erectile dysfunction. 08/13/20   [name], MD  TRUEPLUS LANCETS 28G MISC Use as instructed to check blood sugar daily. 07/02/18   [name], MD     Allergies    Patient has no known allergies.   Review of Systems   Review of Systems A comprehensive review of systems was completed and negative except as noted in HPI.    Physical Exam BP 122/68   Pulse 69   Temp 98.1  F (36.7 C) (Oral)   Resp 15   Ht '5\' 8"'  (1.727 m)   Wt 104.3 kg   SpO2 95%   BMI 34.97 kg/m   Physical Exam Vitals and nursing note reviewed.  Constitutional:      Appearance: Normal appearance.  HENT:     Head: Normocephalic and atraumatic.     Nose: Nose normal.     Mouth/Throat:     Mouth: Mucous membranes are moist.  Eyes:     Extraocular Movements: Extraocular movements intact.     Conjunctiva/sclera: Conjunctivae normal.  Cardiovascular:     Rate and Rhythm: Tachycardia present.  Pulmonary:     Effort: Pulmonary effort is normal.     Breath sounds: Rales present.  Abdominal:     General: Abdomen is flat.     Palpations: Abdomen is soft.     Tenderness: There is no abdominal tenderness.   Musculoskeletal:        General: No swelling. Normal range of motion.     Cervical back: Neck supple.     Right lower leg: Edema (trace) present.     Left lower leg: Edema (trace) present.  Skin:    General: Skin is warm and dry.  Neurological:     General: No focal deficit present.     Mental Status: He is alert.  Psychiatric:        Mood and Affect: Mood normal.      ED Results / Procedures / Treatments   Labs (all labs ordered are listed, but only abnormal results are displayed) Labs Reviewed  RESP PANEL BY RT-PCR (FLU A&B, COVID) [id number]  COMPREHENSIVE METABOLIC PANEL  CBC WITH DIFFERENTIAL/PLATELET  BRAIN NATRIURETIC PEPTIDE  APTT  PROTIME-INR  TROPONIN I (HIGH SENSITIVITY)    EKG EKG Interpretation  Date/Time:  Saturday August 18 2020 00:43:41 EST Ventricular Rate:  136 PR Interval:    QRS Duration: 78 QT Interval:  262 QTC Calculation: 394 R Axis:   30 Text Interpretation: Atrial fibrillation Repol abnrm suggests ischemia, diffuse leads Since last tracing Atrial fibrillation has replaced Normal sinus rhythm Non-specific ST-t changes NOW PRESENT Confirmed by Calvert Cantor [phone]) on 08/18/2020 12:45:18 AM   Radiology No results found.  Procedures .Critical Care Performed by: Truddie Hidden, MD Authorized by: Truddie Hidden, MD   Critical care provider statement:    Critical care time (minutes):  45   Critical care time was exclusive of:  Separately billable procedures and treating other patients   Critical care was necessary to treat or prevent imminent or life-threatening deterioration of the following conditions:  Cardiac failure   Critical care was time spent personally by me on the following activities:  Discussions with consultants, evaluation of patient's response to treatment, examination of patient, ordering and performing treatments and interventions, ordering and review of laboratory studies, ordering and review of radiographic  studies, pulse oximetry, re-evaluation of patient's condition, obtaining history from patient or surrogate and review of old charts    Medications Ordered in the ED Medications  diltiazem (CARDIZEM) 1 mg/mL load via infusion 10 mg (has no administration in time range)    And  diltiazem (CARDIZEM) 125 mg in dextrose 5% 125 mL (1 mg/mL) infusion (has no administration in time range)     MDM Rules/Calculators/A&P MDM EKG with new Afib, no prior history of same. Question rate related ischemic changes. Will begin Cardizem drip for rate control. Check labs and CXR. Covid swab. Anticipate admission.   ED Course  I have reviewed the triage vital signs and the nursing notes.  Pertinent labs & imaging results that were available during my care of the patient were reviewed by me and considered in my medical decision making (see chart for details).  Clinical Course as of 08/18/20 0701  Sat Aug 18, 2020  0130 CBC shows leukocytosis, no anemia.  [CS]  0138 Coags are normal.  [CS]  0139 HR remains in 120-130s, not in 60s as initially documented (that is the SpO2 HR and is not reading accurately), RN aware. Cardizem drip  is infusing.  [CS]  5366 CXR images and results reviewed, signs of edema and interstitial lung disease.  [CS]  0158 BNP is normal.  [CS]  0209 CMP is unremarkable. Trop is elevated >1800. Will begin Heparin drip both for ACS and new onset Afib of unknown duration. Cardiology paged.  [CS]  4403 Spoke with Dr. Kalman Shan, Cardiology who will evaluate the patient. He is concerned about possible PNA given leukocytosis and CXR. Will add blood cultures and give a dose of Rocephin/Zithromax in the meantime. Repeat EKG now that rate has improved to see if ST depression is improved as well.  [CS]  [id number] Patient has converted to sinus rhythm and ST changes are also improved.  [CS]  4742 Second trop increased to ~6000. Dr. Kalman Shan with Cardiology informed.  [CS]  5956 Diltiazem stopped per Dr. Kalman Shan  request now he's converted to SR. [CS]    Clinical Course User Index [CS] Truddie Hidden, MD    Final Clinical Impression(s) / ED Diagnoses Final diagnoses:  New onset a-fib [facility])  NSTEMI (non-ST elevated myocardial infarction) [facility])  Community acquired pneumonia, unspecified laterality    Rx / DC Orders ED Discharge Orders    None       Truddie Hidden, MD 08/18/20 [phone]

## 2020-08-18 NOTE — H&P (Signed)
Cardiology History & Physical    Patient ID: Kenric Ginger MRN: [id number], DOB/AGE: 54/24/1967   Admit date: 08/18/2020  Primary Physician: [name], MD Primary Cardiologist: No primary care provider on file.  Patient Profile    Johnny Nichols is a 54 y.o. year old man with a history of type 2 DM, hypertension, and OSA on CPAP, who presented primarily with dyspnea and associated chest tightness, found to be in AF with RVR.  History of Present Illness    Mr Calender works in [place] and while working today developed acute onset dyspnea associated with chest tightness around 4 pm. He went to a nearby EMS station for preliminary evaluation. At that point his symptoms had mostly resolved and his ECG, as well as VS were normal. Continued with work and was doing well until the acute dyspnea and chest tightness returned later that night around 10 pm while finishing up in "the shop". This time he felt like his lungs suddenly filled with fluid and began coughing up pink phlegm. Broke out in cold sweat as well with perfuse diaphoresis. Felt like a panic attack. The chest discomfort is not pleuritic. He denies fever or malaise. He has been having about a week of upper respiratory congestion and mild dyspnea, but no chest pain. He denies any chest pain prior to today.   Came in via EMS, who noted him to be in AF with RVR with rates in the 140s and significant ST changes (up to 3 mm diffuse ST depression with STE in aVR). He was also hypoxic and on arrival continued to require 4 L . Lab work was significant for a leukocytosis and a troponin of 1826. COVID/flu are both negative. CXR showed increased interstitial lung and hazy opacity in the LLL. He was started on a diltiazem infusion with bolus for the AF, as well as heparin. Also received full dose aspirin. Antibiotics given for possible CAP. He converted to sinus rhythm at 2 am this morning prior to my evaluation.   Currently, he  reports feeling well. Still feels some rattling in his chest and intermittently coughing up brown sputum. Dyspnea has resolved while at rest, but still feels some tightness in his chest. He does not smoke. No significatn alcohol history or illicit drug use. Good CPAP adherence. No history of abnormal bleeding or upcoming procedures .Father was believed to have had an MI at age 54 and later had an angioplasty. Mother currently has some issue with her heart, history of "heart murmur" and recently had a pacemaker following a work-up for recurrent episodes of near-syncope.   Past Medical History   Past Medical History:  Diagnosis Date  . Diabetes mellitus without complication ([place])   . Hypertension   . Sleep apnea     History reviewed. No pertinent surgical history.   Allergies No Known Allergies  Home Medications    Prior to Admission medications   Medication Sig Start Date End Date Taking? Authorizing Provider  amLODipine (NORVASC) 5 MG tablet Take 1 tablet (5 mg total) by mouth daily. 08/13/20   [name], MD  atorvastatin (LIPITOR) 20 MG tablet Take 1 tablet (20 mg total) by mouth daily. Stop Pravastatin 08/13/20   [name], MD  Blood Glucose Monitoring Suppl (TRUE METRIX METER) w/Device KIT Use to check blood sugar as instructed. 05/14/20   Camillia Herter, NP  Dulaglutide (TRULICITY) 1.5 CM/0.3KJ SOPN Inject 1.5 mg into the skin once a week. 08/13/20   Karle Plumber  B, MD  glipiZIDE (GLUCOTROL) 5 MG tablet TAKE 1 TABLET BY MOUTH 2 (TWO) TIMES DAILY BEFORE A MEAL. 08/13/20   [name], MD  glucose blood (TRUE METRIX BLOOD GLUCOSE TEST) test strip Use as instructed to check blood sugar daily. 07/02/18   [name], MD  lisinopril (ZESTRIL) 20 MG tablet Take 1 tablet (20 mg total) by mouth daily. 08/13/20   [name], MD  metFORMIN (GLUCOPHAGE) 1000 MG tablet Take 1 tablet (1,000 mg total) by mouth 2 (two) times daily with a meal. 08/13/20    [name], MD  sildenafil (VIAGRA) 100 MG tablet Take 0.5-1 tablets (50-100 mg total) by mouth daily as needed for erectile dysfunction. 08/13/20   [name], MD  TRUEPLUS LANCETS 28G MISC Use as instructed to check blood sugar daily. 07/02/18   [name], MD    Family History    Family History  Problem Relation Age of Onset  . Heart disease Mother   . Cancer Father   . Hypertension Other   . Obesity Other    He indicated that his mother is alive. He indicated that his father is deceased. He indicated that the status of his other is unknown.   Social History    Social History   Socioeconomic History  . Marital status: Divorced    Spouse name: Not on file  . Number of children: Not on file  . Years of education: Not on file  . Highest education level: Not on file  Occupational History  . Not on file  Tobacco Use  . Smoking status: Never Smoker  . Smokeless tobacco: Never Used  Substance and Sexual Activity  . Alcohol use: No    Comment: no drink x 3 weeks  . Drug use: No  . Sexual activity: Not on file  Other Topics Concern  . Not on file  Social History Narrative  . Not on file   Social Determinants of Health   Financial Resource Strain: Not on file  Food Insecurity: Not on file  Transportation Needs: Not on file  Physical Activity: Not on file  Stress: Not on file  Social Connections: Not on file  Intimate Partner Violence: Not on file     Review of Systems    A comprehensive ROS was performed with pertinent positives and negatives noted in the HPI.  Physical Exam    BP 110/77   Pulse 94   Temp 98.1 F (36.7 C) (Oral)   Resp (!) 22   Ht $R'5\' 8"'kV$  (1.727 m)   Wt 104.3 kg   SpO2 97%   BMI 34.97 kg/m  General: Alert, NAD HEENT: Normal  Neck: No bruits or JVD. Lungs:  Faint diffuse crackles, most prominent in lower-mid lung fields. Resp regular and unlabored. Heart: Regular rhythm, no B7/J6, holosystolic murmur from LLSB to  apex. Abdomen: Soft, non-tender, non-distended, BS +.  Extremities: Warm. No clubbing, cyanosis or edema. Radial pulses 2+ and equal bilaterally. Psych: Normal affect. Neuro: Alert and oriented. No gross focal deficits. No abnormal movements.  Labs    Cardiac Panel (last 3 results) Recent Labs    08/18/20 0100 08/18/20 0339  TROPONINIHS 1,826* 5,960*    Lab Results  Component Value Date   WBC 17.0 (H) 08/18/2020   HGB 14.6 08/18/2020   HCT 44.7 08/18/2020   MCV 87.0 08/18/2020   PLT 279 08/18/2020    Recent Labs  Lab 08/18/20 0100  NA 138  K  3.7  CL 105  CO2 20*  BUN 19  CREATININE 1.04  CALCIUM 8.9  PROT 6.9  BILITOT 0.6  ALKPHOS 49  ALT 24  AST 38  GLUCOSE 185*   Lab Results  Component Value Date   CHOL 190 10/12/2019   HDL 36 (L) 10/12/2019   LDLCALC 137 (H) 10/12/2019   TRIG 91 10/12/2019     Radiology Studies    DG Chest Port 1 View  Result Date: 08/18/2020 CLINICAL DATA:  Shortness of breath. EXAM: PORTABLE CHEST 1 VIEW COMPARISON:  March 15, 2013 FINDINGS: Mild to moderate severity diffusely increased interstitial lung markings are seen. There is no evidence of a pleural effusion or pneumothorax. The heart size and mediastinal contours are within normal limits. The visualized skeletal structures are unremarkable. IMPRESSION: Mild to moderate severity diffuse interstitial lung disease with a suspected superimposed component of interstitial edema. Electronically Signed   By: Virgina Norfolk M.D.   On: 08/18/2020 01:51    ECG & Cardiac Imaging    Serial EMS ECGs show AF with RVR, rates 111 to 143, all with diffuse ST depression up to 2-3 mm and ST elevation in aVR - personally reviewed.  ECG (00:43): AF with RVR, similar ST changes  ECG (03:40): NSR, nonspecific ST-T wave changes, improved  Assessment & Plan   Paroxysmal AF with RVR: seems to be symptomatic, but I don't think this can explain the severity of ST changes or degree of troponin  elevation. Possibly precipitated by ACS. He does also have AF risk factors however. CHADS2-VASc is 2-3, and warrants long-term anticoagulation. He is now in sinus rhythm after spontaneous conversion on diltiazem. - Continue heparin in setting of concomitant ACS, transition to [place] prior to discharge - Discontinue diltiazem infusion - Start oral metoprolol - Could consider rhythm control if this becomes recurrent  NSTEMI: he has risk factors with his family history and diabetes in particular. Symptoms are somewhat atypical, though as stated above, the degree of troponin elevation and ST changes on ECG are not adequately explained by AF, particularly at rates in the 140s. Still has some minor chest pressure despite being in sinus rhythm now, though minimal. - Will continue trending troponin every 4 hours, repeat ECG in the morning - Check TTE and lipid profile in the morning - Received full-dose aspirin and heparin bolus - Continue heparin infusion pending cath. Daily aspirin - Increase atorvastatin to 42m - Start metoprolol as above, as well as SL nitro prn - He is NPO for now; diet pending cath plan   Atypical community acquired pneumonia: presentation not entirely convincing, however given his leukocytosis, productive cough, and hypoxia without significant evidence of hypervolemia, will at least treat empirically with doxycycline for 5 days. Blood cultures drawn in ED are pending.   Type 2 DM: A1c 6.5%, on oral outpatient regimen: - Hold oral diabetic regimen while inpatient, restart at discharge - SSI protocol and prandial insulin. Can add basal if he remains in the hospital tomorrow - On ACEi and statin as above  Hypertension: normotensive, currently on diltiazem infusion - Hold lisinopril this morning, restart tomorrow at half home-dose - Hold amlodipine for titration of antianginals and GDMT - Stopping diltiazem and starting oral metoprolol, as above.  OSA: reports compliance with  CPAP. Orders placed.    Nutrition: NPO pending cath scheduling DVT ppx: heparin infusion Advanced Care Planning: Full Code   Signed, SMarykay Lex MD 08/18/2020, 4:31 AM

## 2020-08-18 NOTE — Progress Notes (Signed)
Paged for NSVT. Monitor showed 2 episodes of NSVT this morning, longest 15 beats. Patient was asymptomatic. BMET pending. Will add on Mag and TSH. Continue to monitor.  Tentative plan for cath on Monday.   Dillin Lofgren Fransico Michael, PA-C

## 2020-08-18 NOTE — ED Notes (Signed)
RN aware of pt elevated VS 

## 2020-08-18 NOTE — Progress Notes (Addendum)
Repeat troponin was elevated 16,000.  Chest CT concern for pulmonary edema.  Taken to [facility] today.  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  He is left dominant.  This is concerning.  We will continue with IV diuresis.  We will proceed with cardiac surgery consult for CABG.   In the interim we will proceed with medical optimization prior to CABG.  Gerri Spore T. Flora Lipps, MD [facility]  [address], [address] [place], [place] [address] [phone]  1:12 PM

## 2020-08-18 NOTE — Progress Notes (Signed)
ANTICOAGULATION CONSULT NOTE - Initial Consult  Pharmacy Consult for Heparin Indication: chest pain/ACS and atrial fibrillation  No Known Allergies  Patient Measurements: Height: '5\' 8"'  (172.7 cm) Weight: 104.3 kg (230 lb) IBW/kg (Calculated) : 68.4 Heparin Dosing Weight: 90 kg  Vital Signs: Temp: 98.1 F (36.7 C) (12/18 0044) Temp Source: Oral (12/18 0044) BP: 118/81 (12/18 0145) Pulse Rate: 121 (12/18 0145)  Labs: Recent Labs    08/18/20 0100  HGB 14.6  HCT 44.7  PLT 279  APTT 26  LABPROT 12.9  INR 1.0  CREATININE 1.04  TROPONINIHS 1,826*    Estimated Creatinine Clearance: 95.1 mL/min (by C-G formula based on SCr of 1.04 mg/dL).   Medical History: Past Medical History:  Diagnosis Date  . Diabetes mellitus without complication ([place])   . Hypertension   . Sleep apnea     Medications:  No current facility-administered medications on file prior to encounter.   Current Outpatient Medications on File Prior to Encounter  Medication Sig Dispense Refill  . amLODipine (NORVASC) 5 MG tablet Take 1 tablet (5 mg total) by mouth daily. 30 tablet 6  . atorvastatin (LIPITOR) 20 MG tablet Take 1 tablet (20 mg total) by mouth daily. Stop Pravastatin 30 tablet 6  . Blood Glucose Monitoring Suppl (TRUE METRIX METER) w/Device KIT Use to check blood sugar as instructed. 1 kit 0  . Dulaglutide (TRULICITY) 1.5 UX/8.3FX SOPN Inject 1.5 mg into the skin once a week. 15 mL 2  . glipiZIDE (GLUCOTROL) 5 MG tablet TAKE 1 TABLET BY MOUTH 2 (TWO) TIMES DAILY BEFORE A MEAL. 60 tablet 6  . glucose blood (TRUE METRIX BLOOD GLUCOSE TEST) test strip Use as instructed to check blood sugar daily. 100 each 11  . lisinopril (ZESTRIL) 20 MG tablet Take 1 tablet (20 mg total) by mouth daily. 30 tablet 6  . metFORMIN (GLUCOPHAGE) 1000 MG tablet Take 1 tablet (1,000 mg total) by mouth 2 (two) times daily with a meal. 60 tablet 6  . sildenafil (VIAGRA) 100 MG tablet Take 0.5-1 tablets (50-100 mg total)  by mouth daily as needed for erectile dysfunction. 5 tablet 3  . TRUEPLUS LANCETS 28G MISC Use as instructed to check blood sugar daily. 100 each 11     Assessment: 55 y.o. male with chest pain, new onset Afib and elevated troponin for, for heparin Goal of Therapy:  Heparin level 0.3-0.7 units/ml Monitor platelets by anticoagulation protocol: Yes   Plan:  Heparin 4000 units IV bolus, then start heparin 1400 units/hr Check heparin level in 6 hours.  Phillis Knack, PharmD, BCPS   Meghan Tiemann, Bronson Curb 08/18/2020,2:07 AM

## 2020-08-18 NOTE — Progress Notes (Addendum)
Cardiology Progress Note  Patient ID: Johnny Nichols MRN: [id number] DOB: 07-31-1966 Date of Encounter: 08/18/2020  Primary Cardiologist: No primary care provider on file.  Subjective   Chief Complaint: Shortness of breath.  HPI: Admitted overnight with pneumonia, A. fib with RVR non-STEMI.  On 4 L of oxygen.  Loud murmur on exam.  ROS:  All other ROS reviewed and negative. Pertinent positives noted in the HPI.     Inpatient Medications  Scheduled Meds: . [START ON 08/19/2020] aspirin EC  81 mg Oral Daily  . atorvastatin  80 mg Oral Daily  . doxycycline  100 mg Oral Q12H  . insulin aspart  0-15 Units Subcutaneous TID WC  . insulin aspart  0-5 Units Subcutaneous QHS  . insulin aspart  4 Units Subcutaneous TID WC  . [START ON 08/19/2020] lisinopril  10 mg Oral Daily  . mouth rinse  15 mL Mouth Rinse BID  . metoprolol tartrate  25 mg Oral BID   Continuous Infusions: . heparin 1,400 Units/hr (08/18/20 0227)   PRN Meds: acetaminophen, nitroGLYCERIN, ondansetron (ZOFRAN) IV   Vital Signs   Vitals:   08/18/20 0445 08/18/20 0500 08/18/20 0619 08/18/20 0700  BP: 112/81 115/81 118/84   Pulse: 78 81 77 84  Resp: 20 (!) 21 17 19   Temp:   98.2 F (36.8 C)   TempSrc:   Oral   SpO2: 94%  95% 94%  Weight:   105.8 kg   Height:   5\' 9"  (1.753 m)     Intake/Output Summary (Last 24 hours) at 08/18/2020 0727 Last data filed at 08/18/2020 08/20/2020 Gross per 24 hour  Intake 232.79 ml  Output 0 ml  Net 232.79 ml   Last 3 Weights 08/18/2020 08/18/2020 08/13/2020  Weight (lbs) 233 lb 4 oz 230 lb 225 lb  Weight (kg) 105.8 kg 104.327 kg 102.059 kg      Telemetry  Overnight telemetry shows normal sinus rhythm with heart rate in the 70s, which I personally reviewed.   ECG  The most recent ECG shows sinus rhythm heart rate 92, isolated T wave inversion in lead II, really unremarkable ECG aside from lead to which I personally reviewed.   Physical Exam   Vitals:   08/18/20 0445  08/18/20 0500 08/18/20 0619 08/18/20 0700  BP: 112/81 115/81 118/84   Pulse: 78 81 77 84  Resp: 20 (!) 21 17 19   Temp:   98.2 F (36.8 C)   TempSrc:   Oral   SpO2: 94%  95% 94%  Weight:   105.8 kg   Height:   5\' 9"  (1.753 m)      Intake/Output Summary (Last 24 hours) at 08/18/2020 0727 Last data filed at 08/18/2020 Gross per 24 hour  Intake 232.79 ml  Output 0 ml  Net 232.79 ml    Last 3 Weights 08/18/2020 08/18/2020 08/13/2020  Weight (lbs) 233 lb 4 oz 230 lb 225 lb  Weight (kg) 105.8 kg 104.327 kg 102.059 kg    Body mass index is 34.44 kg/m.  General: Well nourished, well developed, in no acute distress Head: Atraumatic, normal size  Eyes: PEERLA, EOMI  Neck: Supple, no JVD Endocrine: No thryomegaly Cardiac: Normal S1, S2; 3 out of 6 systolic ejection murmur Lungs: Expiratory wheezing noted, crackles  Abd: Soft, nontender, no hepatomegaly  Ext: No edema, pulses 2+ Musculoskeletal: No deformities, BUE and BLE strength normal and equal Skin: Warm and dry, no rashes   Neuro: Alert and oriented to person, place, time,  and situation, CNII-XII grossly intact, no focal deficits  Psych: Normal mood and affect   Labs  High Sensitivity Troponin:   Recent Labs  Lab 08/18/20 0100 08/18/20 0339  TROPONINIHS 1,826* 5,960*     Cardiac EnzymesNo results for input(s): TROPONINI in the last 168 hours. No results for input(s): TROPIPOC in the last 168 hours.  Chemistry Recent Labs  Lab 08/18/20 0100  NA 138  K 3.7  CL 105  CO2 20*  GLUCOSE 185*  BUN 19  CREATININE 1.04  CALCIUM 8.9  PROT 6.9  ALBUMIN 3.4*  AST 38  ALT 24  ALKPHOS 49  BILITOT 0.6  GFRNONAA >60  ANIONGAP 13    Hematology Recent Labs  Lab 08/18/20 0100  WBC 17.0*  RBC 5.14  HGB 14.6  HCT 44.7  MCV 87.0  MCH 28.4  MCHC 32.7  RDW 12.7  PLT 279   BNP Recent Labs  Lab 08/18/20 0100  BNP 85.3    DDimer No results for input(s): DDIMER in the last 168 hours.   Radiology  DG Chest  Port 1 View  Result Date: 08/18/2020 CLINICAL DATA:  Shortness of breath. EXAM: PORTABLE CHEST 1 VIEW COMPARISON:  March 15, 2013 FINDINGS: Mild to moderate severity diffusely increased interstitial lung markings are seen. There is no evidence of a pleural effusion or pneumothorax. The heart size and mediastinal contours are within normal limits. The visualized skeletal structures are unremarkable. IMPRESSION: Mild to moderate severity diffuse interstitial lung disease with a suspected superimposed component of interstitial edema. Electronically Signed   By: Aram Candela M.D.   On: 08/18/2020 01:51    Cardiac Studies  Echo pending   Patient Profile  Johnny Nichols is a 54 y.o. male with diabetes, hypertension, OSA who was admitted on 08/18/2020 with A. fib with RVR, pneumonia and non-STEMI.  Assessment & Plan   1.  A. fib with RVR -Unclear trigger.  Has pneumonia on x-ray.  Converted back to sinus rhythm. -Diltiazem drip off.  Continue beta-blocker. -On heparin drip.  Chads vas score is 2.  We will transition to DOAC after non-STEMI is managed.  2.  Non-STEMI -Chest tightness and shortness of breath reported.  Troponin elevated 5000.  EKG really unremarkable except for isolated T wave inversion in lead II. -No ST elevation.  No chest tightness. -Continue aspirin and heparin.  On a beta-blocker.  On high intensity statin. -Loud murmur.  Echo is pending.  He does report mother has pacemaker.  Possibly hypertrophic cardiomyopathy versus valvular issue?  Echo will shed light on this. -I will wait for his repeat troponin.  As long as it is trending down.  We will let him eat and plan for left heart catheterization on Monday.  3.  Hypoxic respiratory failure/lung infiltrates on chest x-ray/pneumonia -BNP is negative. covid and flu negative.  No evidence of volume overload on exam.  I am concerned for elevated white count and interstitial pneumonia. -Was placed on doxycycline by the night  physician.  We will transition to Rocephin and azithromycin.  We will have hospital medicine assist with further management of pneumonia. -Chest CT noncontrast for further evaluation.  May need pulmonary involvement.  4.  Loud murmur -Unclear etiology.  Echocardiogram is pending.  5.  Diabetes -Sliding scale insulin  6. HTN -Metoprolol at home lisinopril.  FEN -No intravenous fluids -N.p.o. until repeat troponin -DVT PPx: Heparin drip -Code: Full  For questions or updates, please contact [facility] Please consult [email or web address] for contact info  under   Time Spent with Patient: I have spent a total of 25 minutes with patient reviewing hospital notes, telemetry, EKGs, labs and examining the patient as well as establishing an assessment and plan that was discussed with the patient.  > 50% of time was spent in direct patient care.    Signed, Lenna Gilford. Flora Lipps, MD [facility]  [facility]  08/18/2020 7:27 AM

## 2020-08-18 NOTE — ED Triage Notes (Signed)
Patient arrives from home with [facility], called for shortness of breath, found to be in afib RVR HR 150-170, 20 mg Cardiazem given by EMS, hx of HTN and DM, initial 02 with EMS was 80's, placed on NRB, now on 4L [place].  EMS vitals   125 HR 22 RR 160/90

## 2020-08-19 ENCOUNTER — Inpatient Hospital Stay (HOSPITAL_COMMUNITY): Payer: Self-pay

## 2020-08-19 DIAGNOSIS — Z01810 Encounter for preprocedural cardiovascular examination: Secondary | ICD-10-CM

## 2020-08-19 LAB — GLUCOSE, CAPILLARY
Glucose-Capillary: 152 mg/dL — ABNORMAL HIGH (ref 70–99)
Glucose-Capillary: 138 mg/dL — ABNORMAL HIGH (ref 70–99)
Glucose-Capillary: 126 mg/dL — ABNORMAL HIGH (ref 70–99)
Glucose-Capillary: 122 mg/dL — ABNORMAL HIGH (ref 70–99)

## 2020-08-19 LAB — CBC
HCT: 42.6 % (ref 39.0–52.0)
Hemoglobin: 13.8 g/dL (ref 13.0–17.0)
MCH: 28 pg (ref 26.0–34.0)
MCHC: 32.4 g/dL (ref 30.0–36.0)
MCV: 86.4 fL (ref 80.0–100.0)
Platelets: 238 10*3/uL (ref 150–400)
RBC: 4.93 MIL/uL (ref 4.22–5.81)
RDW: 13.3 % (ref 11.5–15.5)
WBC: 10.9 10*3/uL — ABNORMAL HIGH (ref 4.0–10.5)
nRBC: 0 % (ref 0.0–0.2)

## 2020-08-19 LAB — BASIC METABOLIC PANEL
Anion gap: 10 (ref 5–15)
BUN: 12 mg/dL (ref 6–20)
CO2: 22 mmol/L (ref 22–32)
Calcium: 8.9 mg/dL (ref 8.9–10.3)
Chloride: 103 mmol/L (ref 98–111)
Creatinine, Ser: 0.87 mg/dL (ref 0.61–1.24)
GFR, Estimated: >60 mL/min (ref >60)
Glucose, Bld: 149 mg/dL — ABNORMAL HIGH (ref 70–99)
Potassium: 3.9 mmol/L (ref 3.5–5.1)
Sodium: 135 mmol/L (ref 135–145)

## 2020-08-19 LAB — HEPARIN LEVEL (UNFRACTIONATED): Heparin Unfractionated: 0.26 IU/mL — ABNORMAL LOW (ref 0.30–0.70)

## 2020-08-19 MED ORDER — INSULIN GLARGINE 100 UNIT/ML ~~LOC~~ SOLN
5.0000 [IU] | Freq: Every day | SUBCUTANEOUS | Status: DC
  Administered 2020-08-19 – 2020-08-21 (×3): 5 [IU] via SUBCUTANEOUS
  Filled 2020-08-19 (×4): qty 0.05

## 2020-08-19 MED ORDER — FUROSEMIDE 10 MG/ML IJ SOLN
40.0000 mg | Freq: Two times a day (BID) | INTRAMUSCULAR | Status: DC
  Administered 2020-08-19 – 2020-08-21 (×4): 40 mg via INTRAVENOUS
  Filled 2020-08-19 (×4): qty 4

## 2020-08-19 MED ORDER — FUROSEMIDE 10 MG/ML IJ SOLN
40.0000 mg | Freq: Once | INTRAMUSCULAR | Status: AC
  Administered 2020-08-19: 40 mg via INTRAVENOUS
  Filled 2020-08-19: qty 4

## 2020-08-19 NOTE — Progress Notes (Signed)
VASCULAR LAB    Pre CABG Dopplers have been performed.  See CV proc for preliminary results.   Seydou Hearns, RVT 08/19/2020, 10:02 AM

## 2020-08-19 NOTE — Progress Notes (Signed)
ANTICOAGULATION CONSULT NOTE  Pharmacy Consult for Heparin Indication: CAD and atrial fibrillation  No Known Allergies  Patient Measurements: Height: 5\' 9"  (175.3 cm) Weight: 105.8 kg (233 lb 4 oz) IBW/kg (Calculated) : 70.7 Heparin Dosing Weight: 90 kg  Vital Signs: Temp: 98.1 F (36.7 C) (12/19 0400) Temp Source: Oral (12/19 0400) BP: 103/72 (12/19 0400) Pulse Rate: 67 (12/19 0400)  Labs: Recent Labs    08/18/20 0100 08/18/20 0339 08/18/20 0636 08/18/20 0937 08/18/20 1050 08/19/20 0503  HGB 14.6  --   --   --   --  13.8  HCT 44.7  --   --   --   --  42.6  PLT 279  --   --   --   --  238  APTT 26  --   --   --   --   --   LABPROT 12.9  --   --   --   --   --   INR 1.0  --   --   --   --   --   HEPARINUNFRC  --   --   --  <0.10*  --  0.26*  CREATININE 1.04  --  0.87  --   --  0.87  TROPONINIHS 1,826* 5,960* 8,667*  --  11-14-1975*  --     Estimated Creatinine Clearance: 116.3 mL/min (by C-G formula based on SCr of 0.87 mg/dL).  Assessment: 54 y.o. male with CAD and Afib awaiting CABG for heparin Goal of Therapy:  Heparin level 0.3-0.7 units/ml Monitor platelets by anticoagulation protocol: Yes   Plan:  Increase Heparin 1850 units/hr   Fedora Knisely, 57 08/19/2020,5:54 AM

## 2020-08-19 NOTE — Progress Notes (Signed)
Patient ID: Johnny Nichols, male   DOB: 06-14-1966, 54 y.o.   MRN: [id number] TCTS DAILY ICU PROGRESS NOTE                   [address]            [name] [address]          [phone]   1 Day Post-Op Procedure(s) (LRB): LEFT HEART CATH AND CORONARY ANGIOGRAPHY (N/A)  Total Length of Stay:  LOS: 1 day   Subjective:  breathing better, no chest pain. 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 - will repeat covid testing before surgery   Objective: Vital signs in last 24 hours: Temp:  [97.6 F (36.4 C)-98.6 F (37 C)] 98.6 F (37 C) (12/19 1132) Pulse Rate:  [67-85] 84 (12/19 1132) Cardiac Rhythm: Normal sinus rhythm (12/19 0900) Resp:  [16-20] 20 (12/19 1132) BP: (97-127)/(67-90) 124/90 (12/19 1132) SpO2:  [92 %-99 %] 93 % (12/19 1132)  Filed Weights   08/18/20 0046 08/18/20 0619  Weight: 104.3 kg 105.8 kg    Weight change:    Hemodynamic parameters for last 24 hours:    Intake/Output from previous day: 12/18 0701 - 12/19 0700 In: 633.3 [P.O.:460; I.V.:173.3] Out: 1750 [Urine:1750]  Intake/Output this shift: Total I/O In: 240 [P.O.:240] Out: 550 [Urine:550]  Current Meds: Scheduled Meds: . aspirin EC  81 mg Oral Daily  . atorvastatin  80 mg Oral Daily  . furosemide  40 mg Intravenous BID  . insulin aspart  0-15 Units Subcutaneous TID WC  . insulin aspart  0-5 Units Subcutaneous QHS  . insulin aspart  4 Units Subcutaneous TID WC  . insulin glargine  5 Units Subcutaneous QHS  . losartan  12.5 mg Oral Daily  . mouth rinse  15 mL Mouth Rinse BID  . metoprolol tartrate  25 mg Oral BID  . sodium chloride flush  3 mL Intravenous Q12H  . spironolactone  12.5 mg Oral Daily   Continuous Infusions: . sodium chloride    . heparin 1,850 Units/hr (08/19/20 1132)   PRN Meds:.sodium chloride, acetaminophen, nitroGLYCERIN, ondansetron (ZOFRAN) IV, sodium chloride  flush  General appearance: alert and cooperative Neurologic: intact Heart: regular rate and rhythm Lungs: diminished breath sounds bibasilar Abdomen: soft, non-tender; bowel sounds normal; no masses,  no organomegaly Extremities: extremities normal, atraumatic, no cyanosis or edema and Homans sign is negative, no sign of DVT  Lab Results: CBC: Recent Labs    08/18/20 0100 08/19/20 0503  WBC 17.0* 10.9*  HGB 14.6 13.8  HCT 44.7 42.6  PLT 279 238   BMET:  Recent Labs    08/18/20 0636 08/19/20 0503  NA 139 135  K 4.0 3.9  CL 109 103  CO2 12* 22  GLUCOSE 151* 149*  BUN 18 12  CREATININE 0.87 0.87  CALCIUM 8.8* 8.9    CMET: Lab Results  Component Value Date   WBC 10.9 (H) 08/19/2020   HGB 13.8 08/19/2020   HCT 42.6 08/19/2020   PLT 238 08/19/2020   GLUCOSE 149 (H) 08/19/2020   CHOL 163 08/18/2020   TRIG 63 08/18/2020   HDL 40 (L) 08/18/2020   LDLCALC 110 (H) 08/18/2020   ALT 24 08/18/2020   AST 38 08/18/2020   NA 135 08/19/2020   K 3.9 08/19/2020   CL 103 08/19/2020   CREATININE 0.87 08/19/2020   BUN 12 08/19/2020  CO2 22 08/19/2020   TSH 0.906 08/18/2020   INR 1.0 08/18/2020   HGBA1C 7.4 (H) 08/18/2020   MICROALBUR 1.4 10/11/2015      PT/INR:  Recent Labs    08/18/20 0100  LABPROT 12.9  INR 1.0   Radiology: VAS US DOPPLER PRE CABG  Result Date: 08/19/2020 PREOPERATIVE VASCULAR EVALUATION  Indications:      Pre-CABG. Risk Factors:     Hypertension, Diabetes, coronary artery disease. Comparison Study: No prior study Performing Technologist: Sherren Kerns RVS  Examination Guidelines: A complete evaluation includes B-mode imaging, spectral Doppler, color Doppler, and power Doppler as needed of all accessible portions of each vessel. Bilateral testing is considered an integral part of a complete examination. Limited examinations for reoccurring indications may be performed as noted.  Right Carotid Findings:  +----------+--------+--------+--------+------------+--------+           PSV cm/sEDV cm/sStenosisDescribe    Comments +----------+--------+--------+--------+------------+--------+ CCA Prox  98      23              heterogenous         +----------+--------+--------+--------+------------+--------+ CCA Distal114     36              heterogenous         +----------+--------+--------+--------+------------+--------+ ICA Prox  91      29      Normal  heterogenous         +----------+--------+--------+--------+------------+--------+ ICA Distal80      31                                   +----------+--------+--------+--------+------------+--------+ ECA       131     27                                   +----------+--------+--------+--------+------------+--------+ Portions of this table do not appear on this page. +----------+--------+-------+--------+------------+           PSV cm/sEDV cmsDescribeArm Pressure +----------+--------+-------+--------+------------+ Subclavian59                                  +----------+--------+-------+--------+------------+ +---------+--------+--+--------+--+ VertebralPSV cm/s45EDV cm/s13 +---------+--------+--+--------+--+ Left Carotid Findings: +----------+--------+--------+--------+----------------------+--------+           PSV cm/sEDV cm/sStenosisDescribe              Comments +----------+--------+--------+--------+----------------------+--------+ CCA Prox  105     30              heterogenous                   +----------+--------+--------+--------+----------------------+--------+ CCA Distal120     42              heterogenous                   +----------+--------+--------+--------+----------------------+--------+ ICA Prox  107     35      1-39%   irregular and calcific         +----------+--------+--------+--------+----------------------+--------+ ICA Distal67      30                                              +----------+--------+--------+--------+----------------------+--------+  ECA       159     24                                             +----------+--------+--------+--------+----------------------+--------+ +----------+--------+--------+--------+------------+ SubclavianPSV cm/sEDV cm/sDescribeArm Pressure +----------+--------+--------+--------+------------+           164                                  +----------+--------+--------+--------+------------+ +---------+--------+--+--------+--+ VertebralPSV cm/s67EDV cm/s23 +---------+--------+--+--------+--+  ABI Findings: +--------+------------------+-----+-----------+--------+ Right   Rt Pressure (mmHg)IndexWaveform   Comment  +--------+------------------+-----+-----------+--------+ [id number]                    triphasic           +--------+------------------+-----+-----------+--------+ PTA     154               1.18 multiphasic         +--------+------------------+-----+-----------+--------+ DP      137               1.05 multiphasic         +--------+------------------+-----+-----------+--------+ +--------+------------------+-----+-----------+-------+ Left    Lt Pressure (mmHg)IndexWaveform   Comment +--------+------------------+-----+-----------+-------+ [id number]                    triphasic          +--------+------------------+-----+-----------+-------+ PTA     152               1.16 multiphasic        +--------+------------------+-----+-----------+-------+ DP      124               0.95 multiphasic        +--------+------------------+-----+-----------+-------+ +-------+---------------+----------------+ ABI/TBIToday's ABI/TBIPrevious ABI/TBI +-------+---------------+----------------+ Right  1.18                            +-------+---------------+----------------+ Left   1.16                            +-------+---------------+----------------+  Right  Doppler Findings: +--------+--------+-----+---------+--------+ Site    PressureIndexDoppler  Comments +--------+--------+-----+---------+--------+ [id number]          triphasic         +--------+--------+-----+---------+--------+ Radial               triphasic         +--------+--------+-----+---------+--------+ Ulnar                triphasic         +--------+--------+-----+---------+--------+  Left Doppler Findings: +--------+--------+-----+---------+--------+ Site    PressureIndexDoppler  Comments +--------+--------+-----+---------+--------+ [id number]          triphasic         +--------+--------+-----+---------+--------+ Radial               triphasic         +--------+--------+-----+---------+--------+ Ulnar                triphasic         +--------+--------+-----+---------+--------+  Summary: Right Carotid: Velocities in the right ICA are consistent with a 1-39% stenosis. Left Carotid: Velocities in the left ICA are consistent with a 1-39% stenosis. Vertebrals:  Bilateral vertebral arteries demonstrate antegrade  flow. Subclavians: Normal flow hemodynamics were seen in bilateral subclavian              arteries. Right ABI: Resting right ankle-brachial index is within normal range. No evidence of significant right lower extremity arterial disease. Left ABI: Resting left ankle-brachial index is within normal range. No evidence of significant left lower extremity arterial disease. Right Upper Extremity: Doppler waveforms remain within normal limits with right radial compression. Doppler waveforms decrease <50% with right ulnar compression. Left Upper Extremity: Doppler waveforms remain within normal limits with left radial compression. Doppler waveform obliterate with left ulnar compression.     Preliminary      Assessment/Plan: S/P Procedure(s) (LRB): LEFT HEART CATH AND CORONARY ANGIOGRAPHY (N/A)  Echo and cath findings reviewed with Dr.  O'Neill-contrary to what the cath findings were the patient does on echo have at least moderate aortic stenosis.  This was discussed with him including the possibility of needing aortic valve replacement in addition to coronary artery bypass grafting, we also discussed the pros and cons of mechanical versus tissue valve. Patient presented with rapid atrial fibrillation, but is unaware of any previous episodes of atrial fibrillation Repeat Covid test prior to surgery  Delight Ovensdward B Sammie Denner 08/19/2020 3:14 PM

## 2020-08-19 NOTE — Progress Notes (Signed)
Cardiology Progress Note  Patient ID: Johnny Nichols MRN: [id number] DOB: November 05, 1965 Date of Encounter: 08/19/2020  Primary Cardiologist: No primary care provider on file.  Subjective   Chief Complaint: Shortness of breath  HPI: Good diuresis.  Still has crackles.  Pro-Cal negative.  No infection.  Antibiotics stopped.  Severe left main disease with diffuse CAD in LAD and circumflex.  Nondominant right.  Moderate aortic stenosis in my opinion.  Will need CABG and valve replacement.  ROS:  All other ROS reviewed and negative. Pertinent positives noted in the HPI.     Inpatient Medications  Scheduled Meds: . aspirin EC  81 mg Oral Daily  . atorvastatin  80 mg Oral Daily  . furosemide  40 mg Intravenous BID  . insulin aspart  0-15 Units Subcutaneous TID WC  . insulin aspart  0-5 Units Subcutaneous QHS  . insulin aspart  4 Units Subcutaneous TID WC  . losartan  12.5 mg Oral Daily  . mouth rinse  15 mL Mouth Rinse BID  . metoprolol tartrate  25 mg Oral BID  . sodium chloride flush  3 mL Intravenous Q12H  . spironolactone  12.5 mg Oral Daily   Continuous Infusions: . sodium chloride    . heparin 1,850 Units/hr (08/19/20 0601)   PRN Meds: sodium chloride, acetaminophen, nitroGLYCERIN, ondansetron (ZOFRAN) IV, sodium chloride flush   Vital Signs   Vitals:   08/18/20 2331 08/19/20 0400 08/19/20 0645 08/19/20 0757  BP: 107/79 103/72  127/82  Pulse: 75 67  85  Resp: Temp: 98 F (36.7 C) 98.1 F (36.7 C)  98.6 F (37 C)  TempSrc: Oral Oral  Oral  SpO2: 96% 92% 98% 99%  Weight:      Height:        Intake/Output Summary (Last 24 hours) at 08/19/2020 0900 Last data filed at 08/19/2020 0758 Gross per 24 hour  Intake 850.22 ml  Output 2300 ml  Net -1449.78 ml   Last 3 Weights 08/18/2020 08/18/2020 08/13/2020  Weight (lbs) 233 lb 4 oz 230 lb 225 lb  Weight (kg) 105.8 kg 104.327 kg 102.059 kg      Telemetry  Overnight telemetry shows sinus rhythm with  heart rate in the 80s, which I personally reviewed.   ECG  The most recent ECG shows normal sinus rhythm, heart rate 80, no ST elevation, nonspecific ST-T changes, which I personally reviewed.   Physical Exam   Vitals:   08/18/20 2331 08/19/20 0400 08/19/20 0645 08/19/20 0757  BP: 107/79 103/72  127/82  Pulse: 75 67  85  Resp: Temp: 98 F (36.7 C) 98.1 F (36.7 C)  98.6 F (37 C)  TempSrc: Oral Oral  Oral  SpO2: 96% 92% 98% 99%  Weight:      Height:         Intake/Output Summary (Last 24 hours) at 08/19/2020 0900 Last data filed at 08/19/2020 0758 Gross per 24 hour  Intake 850.22 ml  Output 2300 ml  Net -1449.78 ml    Last 3 Weights 08/18/2020 08/18/2020 08/13/2020  Weight (lbs) 233 lb 4 oz 230 lb 225 lb  Weight (kg) 105.8 kg 104.327 kg 102.059 kg    Body mass index is 34.44 kg/m.  General: Well nourished, well developed, in no acute distress Head: Atraumatic, normal size  Eyes: PEERLA, EOMI  Neck: Supple, no JVD Endocrine: No thryomegaly Cardiac: Normal S1, S2; RRR; no murmurs, rubs, or gallops Lungs: Crackles at  the lung bases Abd: Soft, nontender, no hepatomegaly  Ext: No edema, pulses 2+ Musculoskeletal: No deformities, BUE and BLE strength normal and equal Skin: Warm and dry, no rashes   Neuro: Alert and oriented to person, place, time, and situation, CNII-XII grossly intact, no focal deficits  Psych: Normal mood and affect   Labs  High Sensitivity Troponin:   Recent Labs  Lab 08/18/20 0100 08/18/20 0339 08/18/20 0636 08/18/20 1050  TROPONINIHS 1,826* 5,960* 8,667* 29,937*     Cardiac EnzymesNo results for input(s): TROPONINI in the last 168 hours. No results for input(s): TROPIPOC in the last 168 hours.  Chemistry Recent Labs  Lab 08/18/20 0100 08/18/20 0636 08/19/20 0503  NA 138 139 135  K 3.7 4.0 3.9  CL 105 109 103  CO2 20* 12* 22  GLUCOSE 185* 151* 149*  BUN 19 18 12   CREATININE 1.04 0.87 0.87  CALCIUM 8.9 8.8* 8.9  PROT  6.9  --   --   ALBUMIN 3.4*  --   --   AST 38  --   --   ALT 24  --   --   ALKPHOS 49  --   --   BILITOT 0.6  --   --   GFRNONAA >60 >60 >60  ANIONGAP 13 18* 10    Hematology Recent Labs  Lab 08/18/20 0100 08/19/20 0503  WBC 17.0* 10.9*  RBC 5.14 4.93  HGB 14.6 13.8  HCT 44.7 42.6  MCV 87.0 86.4  MCH 28.4 28.0  MCHC 32.7 32.4  RDW 12.7 13.3  PLT 279 238   BNP Recent Labs  Lab 08/18/20 0100  BNP 85.3    DDimer No results for input(s): DDIMER in the last 168 hours.   Radiology  CT CHEST WO CONTRAST  Result Date: 08/18/2020 CLINICAL DATA:  Respiratory failure. EXAM: CT CHEST WITHOUT CONTRAST TECHNIQUE: Multidetector CT imaging of the chest was performed following the standard protocol without IV contrast. COMPARISON:  Chest x-ray August 18, 2020 FINDINGS: Cardiovascular: Three-vessel coronary artery disease identified. The heart size is within normal limits. The thoracic aorta demonstrates very mild calcified atherosclerosis. No aneurysm. The central pulmonary arteries are normal in caliber. Mediastinum/Nodes: No enlarged mediastinal or axillary lymph nodes. Thyroid gland, trachea, and esophagus demonstrate no significant findings. Lungs/Pleura: Central airways are normal. Diffuse patchy ground-glass opacities affecting all lobes. Interlobular septal thickening identified, particularly in the bases. The ground-glass opacities are somewhat nodular in appearance in the bases, particularly on the right. No suspicious nodules or masses although evaluation is limited due to the ground-glass opacities. Mild atelectasis seen dependently in the bases, left greater than right. Upper Abdomen: No acute abnormality. Musculoskeletal: No chest wall mass or suspicious bone lesions identified. IMPRESSION: 1. Diffuse ground-glass opacities in the lungs. Diffuse interlobular septal thickening, particularly in the bases. The findings are favored to represent pulmonary edema. Atypical infection  considered less likely. Recommend clinical correlation. 2. Three-vessel coronary artery disease. 3. Mild atherosclerotic change in the thoracic aorta. Aortic Atherosclerosis (ICD10-I70.0). Electronically Signed   By: August 20, 2020 III M.D   On: 08/18/2020 12:18   CARDIAC CATHETERIZATION  Addendum Date: 08/18/2020    Dist LM to Ost LAD lesion is 80% stenosed.  Mid Cx lesion is 80% stenosed.  Ost Cx lesion is 75% stenosed.  Prox RCA lesion is 80% stenosed.  LV end diastolic pressure is moderately elevated. LVEDP 24 mm Hg.  There is no aortic valve stenosis.  Tortuosity of the right subclavian artery noted.  Severe  left main disease.  Plan for surgical consult.  IV Lasix for increased LVEDP. Aggressive secondary prevention.   Result Date: 08/18/2020  Dist LM to Ost LAD lesion is 80% stenosed.  Mid Cx lesion is 80% stenosed.  Ost Cx lesion is 75% stenosed.  Prox RCA lesion is 80% stenosed.  LV end diastolic pressure is moderately elevated. LVEDP 24 mm Hg.  There is no aortic valve stenosis.  Severe left main disease.  Plan for surgical consult.  IV Lasix for increased LVEDP. Aggressive secondary prevention.   DG Chest Port 1 View  Result Date: 08/18/2020 CLINICAL DATA:  Shortness of breath. EXAM: PORTABLE CHEST 1 VIEW COMPARISON:  March 15, 2013 FINDINGS: Mild to moderate severity diffusely increased interstitial lung markings are seen. There is no evidence of a pleural effusion or pneumothorax. The heart size and mediastinal contours are within normal limits. The visualized skeletal structures are unremarkable. IMPRESSION: Mild to moderate severity diffuse interstitial lung disease with a suspected superimposed component of interstitial edema. Electronically Signed   By: Aram Candela M.D.   On: 08/18/2020 01:51   ECHOCARDIOGRAM COMPLETE  Result Date: 08/18/2020    ECHOCARDIOGRAM REPORT   Patient Name:   DWON SKY Date of Exam: 08/18/2020 Medical Rec #:  [id number]          Height:       69.0 in Accession #:    [id number]        Weight:       233.2 lb Date of Birth:  Sep 08, 1965        BSA:          2.206 m Patient Age:    54 years          BP:           119/87 mmHg Patient Gender: M                 HR:           88 bpm. Exam Location:  Inpatient Procedure: 2D Echo, 3D Echo, Cardiac Doppler, Color Doppler and Strain Analysis Indications:    121-121.4 ST elevation (STEMI) and non-ST elevation (NSTEMI)                 myocardial infarction  History:        Patient has no prior history of Echocardiogram examinations.                 Acute MI, Signs/Symptoms:Chest Pain and Dyspnea; Risk                 Factors:Hypertension, Diabetes, Dyslipidemia and Sleep Apnea.  Sonographer:    Sheralyn Boatman RDCS Referring Phys: [id number] SCOTT W ROSE  Sonographer Comments: Technically difficult study due to poor echo windows. Global longitudinal strain was attempted. IMPRESSIONS  1. Left ventricular ejection fraction, by estimation, is 45 to 50%. The left ventricle has mildly decreased function. The basal-to-mid lateral wall appears moderately hypokinetic based on limited views. The rest of the LV segments appear mildly hypokinetic.There is mild concentric left ventricular hypertrophy. Left ventricular diastolic parameters are consistent with Grade II diastolic dysfunction (pseudonormalization).  2. Right ventricular systolic function is normal. The right ventricular size is normal.  3. The mitral valve is degenerative. Mild to moderate mitral valve regurgitation. Moderate mitral annular calcification.  4. The aortic valve is tricuspid. There is moderate calcification of the aortic valve. There is moderate thickening of the aortic valve. Aortic valve regurgitation is not visualized. There is  moderate aortic stenosis with AVA 1.0cm2, mean gradient 17mmHg, peak velocity 2.3254m/s, DOI 0.34. LVOT VTI 18.  5. The inferior vena cava is normal in size with greater than 50% respiratory variability, suggesting right  atrial pressure of 3 mmHg. FINDINGS  Left Ventricle: Left ventricular ejection fraction, by estimation, is 45 to 50%. The left ventricle has mildly decreased function. The basal-to-mid lateral LV sgements appear moderately hypokinetic based on limited views. The rest of the LV segments are  mildly hypokinetic. The left ventricular internal cavity size was normal in size. There is mild concentric left ventricular hypertrophy. Left ventricular diastolic parameters are consistent with Grade II diastolic dysfunction (pseudonormalization). Right Ventricle: The right ventricular size is normal. No increase in right ventricular wall thickness. Right ventricular systolic function is normal. Left Atrium: Left atrial size was normal in size. Right Atrium: Right atrial size was normal in size. Pericardium: There is no evidence of pericardial effusion. Mitral Valve: The mitral valve is degenerative in appearance. There is moderate thickening of the mitral valve leaflet(s). There is moderate calcification of the mitral valve leaflet(s). Moderate mitral annular calcification. Mild to moderate mitral valve regurgitation. Tricuspid Valve: The tricuspid valve is normal in structure. Tricuspid valve regurgitation is trivial. Aortic Valve: The aortic valve is calcified. There is moderate calcification of the aortic valve. There is moderate thickening of the aortic valve. Aortic valve regurgitation is not visualized. There is moderate aortic stenosis with AVA 1cm2, mean gradient 17mmHg, peak velocity 2.5854m/s, DOI 0.34. LVOT VTI 18. Pulmonic Valve: The pulmonic valve was normal in structure. Pulmonic valve regurgitation is trivial. Aorta: The aortic root and ascending aorta are structurally normal, with no evidence of dilitation. Venous: The inferior vena cava is normal in size with greater than 50% respiratory variability, suggesting right atrial pressure of 3 mmHg. IAS/Shunts: No atrial level shunt detected by color flow Doppler.   LEFT VENTRICLE PLAX 2D LVIDd:         4.80 cm      Diastology LVIDs:         3.80 cm      LV e' medial:    7.62 cm/s LV PW:         1.30 cm      LV E/e' medial:  14.4 LV IVS:        1.30 cm      LV e' lateral:   6.42 cm/s LVOT diam:     1.90 cm      LV E/e' lateral: 17.1 LV SV:         51 LV SV Index:   23 LVOT Area:     2.84 cm                              3D Volume EF: LV Volumes (MOD)            3D EF:        50 % LV vol d, MOD A2C: 88.1 ml  LV EDV:       168 ml LV vol d, MOD A4C: 103.0 ml LV ESV:       84 ml LV vol s, MOD A2C: 46.8 ml  LV SV:        84 ml LV vol s, MOD A4C: 56.1 ml LV SV MOD A2C:     41.3 ml LV SV MOD A4C:     103.0 ml LV SV MOD BP:  49.5 ml RIGHT VENTRICLE             IVC RV S prime:     13.10 cm/s  IVC diam: 1.80 cm TAPSE (M-mode): 2.3 cm LEFT ATRIUM             Index       RIGHT ATRIUM           Index LA diam:        4.20 cm 1.90 cm/m  RA Area:     10.60 cm LA Vol (A2C):   53.1 ml 24.08 ml/m RA Volume:   21.00 ml  9.52 ml/m LA Vol (A4C):   43.3 ml 19.63 ml/m LA Biplane Vol: 51.3 ml 23.26 ml/m  AORTIC VALVE AV Area (Vmax):    1.75 cm AV Area (Vmean):   1.69 cm AV Area (VTI):     0.96 cm AV Vmax:           274.50 cm/s AV Vmean:          191.500 cm/s AV VTI:            0.534 m AV Peak Grad:      30.1 mmHg AV Mean Grad:      17.0 mmHg LVOT Vmax:         169.00 cm/s LVOT Vmean:        114.000 cm/s LVOT VTI:          0.180 m LVOT/AV VTI ratio: 0.34  AORTA Ao Root diam: 3.20 cm Ao Asc diam:  2.80 cm MITRAL VALVE MV Area (PHT): 5.02 cm      SHUNTS MV Decel Time: 151 msec      Systemic VTI:  0.18 m MR Peak grad:    114.5 mmHg  Systemic Diam: 1.90 cm MR Mean grad:    72.0 mmHg MR Vmax:         535.00 cm/s MR Vmean:        389.0 cm/s MR PISA:         1.01 cm MR PISA Eff ROA: 7 mm MR PISA Radius:  0.40 cm MV E velocity: 110.00 cm/s MV A velocity: 56.30 cm/s MV E/A ratio:  1.95 Laurance Flatten MD Electronically signed by Laurance Flatten MD Signature Date/Time: 08/18/2020/12:47:02 PM     Final     Cardiac Studies  TTE 08/19/2020 1. Left ventricular ejection fraction, by estimation, is 45 to 50%. The  left ventricle has mildly decreased function. The basal-to-mid lateral  wall appears moderately hypokinetic based on limited views. The rest of  the LV segments appear mildly  hypokinetic.There is mild concentric left ventricular hypertrophy. Left  ventricular diastolic parameters are consistent with Grade II diastolic  dysfunction (pseudonormalization).  2. Right ventricular systolic function is normal. The right ventricular  size is normal.  3. The mitral valve is degenerative. Mild to moderate mitral valve  regurgitation. Moderate mitral annular calcification.  4. The aortic valve is tricuspid. There is moderate calcification of the  aortic valve. There is moderate thickening of the aortic valve. Aortic  valve regurgitation is not visualized. There is moderate aortic stenosis  with AVA 1.0cm2, mean gradient  , peak velocity 2.49m/s, DOI 0.34. LVOT VTI 18.  5. The inferior vena cava is normal in size with greater than 50%  respiratory variability, suggesting right atrial pressure of 3 mmHg.   LHC 08/18/2020   Dist LM to Ost LAD lesion is 80% stenosed.  Mid Cx lesion is 80% stenosed.  Suezanne Jacquet  Cx lesion is 75% stenosed.  Prox RCA lesion is 80% stenosed.  LV end diastolic pressure is moderately elevated. LVEDP 24 mm Hg.  There is no aortic valve stenosis.  Tortuosity of the right subclavian artery noted.   Severe left main disease.  Plan for surgical consult.    IV Lasix for increased LVEDP.   Aggressive secondary prevention.   Patient Profile  Dakin Madani is a 54 y.o. male with diabetes, hypertension, OSA who was admitted on 08/18/2020 with A. fib with RVR and non-STEMI.   Assessment & Plan   1. Afib -Admitted with A. fib with RVR.  Briefly placed on diltiazem drip.  Has converted back to sinus rhythm. -On heparin. -We will need  CABG.  We will ask for left atrial appendage clipping at the time of CABG.  2.  Non-STEMI/three-vessel CAD -Admitted with rising troponin.  EKG really was unremarkable.  No ST elevation.  There was concern for pneumonia but this was pulmonary edema.  He was taken for left heart catheterization which shows left dominant system with 80% left main disease, mild disease in the LAD and severe disease in the circumflex.  Suspect he was having global ischemia that put him into heart failure. -Surgical consult for bypass.  Likely will take place tomorrow. -Continue aspirin.  On heparin drip.  Continue high intensity statin  3.  Ischemic cardiomyopathy, EF roughly 45% -Secondary to ischemia.  Profound three-vessel CAD. -No concerns for pneumonia. -Oxygen status improving.  Continue with Lasix 40 mg twice daily to optimize for surgery. -He is on a beta-blocker.  Continue losartan and Aldactone. -EDP at the time of cath was 24 mmHg.  Still satting in the low 90s on room air.  4.  Concerns for pneumonia -Medicine team consulted.  Chest CT shows pulmonary edema.  Procalcitonin negative.  Antibiotics were stopped.  He does not have pneumonia.  This appears to just be heart failure.  5.  Moderate aortic stenosis -Tricuspid aortic valve that is severely calcified with restricted movement.  I did personally review the echocardiogram.  V-max 2.8 m/s.  Mean gradient 19 mmHg.  Stroke-volume computer to be 56 cc with a stroke-volume index of 23 cc/m2.  Dimensionless index 0.32 with an aortic valve area by VTI 1.0 cm2. -Overall this is consistent with moderate aortic stenosis.  There is discrepancy between gradient and cardiac catheter pressure recovery.  Gradients are a bit lower as well due to low stroke volume index in the setting of ischemic cardiomyopathy (23 cc/m2).  -Murmur also consistent with at least moderate aortic stenosis. -I would recommend aortic valve replacement at the time of surgery.  I will  discuss this with surgery.  Overall, would recommend CABG, aortic valve replacement and left atrial appendage clipping.  I did briefly discussed mechanical versus prosthetic valve with him today.  He will discuss this further with surgery.  He was undecided at the time of my discussion.  6.  Diabetes -A1c 7.4.  I have added 5 units of Lantus at night for better glycemic control.  Sliding scale insulin.  He will likely benefit from GLP-1 agonist versus SGLT2 inhibitor at the time of discharge.  7.  Hypertension -Metoprolol, losartan and Aldactone.  FEN -No intravenous fluids -Salt restricted diet -N.p.o. at midnight -DVT PPx: Heparin drip -Code: Full  For questions or updates, please contact [facility] Please consult [email or web address] for contact info under   Time Spent with Patient: I have spent a total of 35 minutes with patient  reviewing hospital notes, telemetry, EKGs, labs and examining the patient as well as establishing an assessment and plan that was discussed with the patient.  > 50% of time was spent in direct patient care.    Signed, Lenna Gilford. Flora Lipps, MD [place]  [facility]  08/19/2020 9:00 AM

## 2020-08-20 ENCOUNTER — Encounter (HOSPITAL_COMMUNITY): Payer: Self-pay | Admitting: Interventional Cardiology

## 2020-08-20 ENCOUNTER — Inpatient Hospital Stay (HOSPITAL_COMMUNITY): Payer: Self-pay

## 2020-08-20 LAB — BASIC METABOLIC PANEL
Anion gap: 13 (ref 5–15)
BUN: 16 mg/dL (ref 6–20)
CO2: 23 mmol/L (ref 22–32)
Calcium: 9.2 mg/dL (ref 8.9–10.3)
Chloride: 98 mmol/L (ref 98–111)
Creatinine, Ser: 0.94 mg/dL (ref 0.61–1.24)
GFR, Estimated: >60 mL/min (ref >60)
Glucose, Bld: 188 mg/dL — ABNORMAL HIGH (ref 70–99)
Potassium: 3.6 mmol/L (ref 3.5–5.1)
Sodium: 134 mmol/L — ABNORMAL LOW (ref 135–145)

## 2020-08-20 LAB — CBC
HCT: 43.4 % (ref 39.0–52.0)
Hemoglobin: 14.4 g/dL (ref 13.0–17.0)
MCH: 28.5 pg (ref 26.0–34.0)
MCHC: 33.2 g/dL (ref 30.0–36.0)
MCV: 85.8 fL (ref 80.0–100.0)
Platelets: 250 10*3/uL (ref 150–400)
RBC: 5.06 MIL/uL (ref 4.22–5.81)
RDW: 13.2 % (ref 11.5–15.5)
WBC: 12.9 10*3/uL — ABNORMAL HIGH (ref 4.0–10.5)
nRBC: 0 % (ref 0.0–0.2)

## 2020-08-20 LAB — HEPARIN LEVEL (UNFRACTIONATED): Heparin Unfractionated: 0.32 IU/mL (ref 0.30–0.70)

## 2020-08-20 LAB — SARS CORONAVIRUS 2 (TAT 6-24 HRS): SARS Coronavirus 2: NEGATIVE

## 2020-08-20 LAB — GLUCOSE, CAPILLARY
Glucose-Capillary: 137 mg/dL — ABNORMAL HIGH (ref 70–99)
Glucose-Capillary: 158 mg/dL — ABNORMAL HIGH (ref 70–99)
Glucose-Capillary: 158 mg/dL — ABNORMAL HIGH (ref 70–99)
Glucose-Capillary: 149 mg/dL — ABNORMAL HIGH (ref 70–99)
Glucose-Capillary: 165 mg/dL — ABNORMAL HIGH (ref 70–99)

## 2020-08-20 MED ORDER — POTASSIUM CHLORIDE CRYS ER 10 MEQ PO TBCR
10.0000 meq | EXTENDED_RELEASE_TABLET | Freq: Two times a day (BID) | ORAL | Status: DC
  Administered 2020-08-20 – 2020-08-21 (×4): 10 meq via ORAL
  Filled 2020-08-20 (×4): qty 1

## 2020-08-20 MED FILL — Nitroglycerin IV Soln 100 MCG/ML in D5W: INTRA_ARTERIAL | Qty: 10 | Status: AC

## 2020-08-20 MED FILL — Verapamil HCl IV Soln 2.5 MG/ML: INTRAVENOUS | Qty: 2 | Status: AC

## 2020-08-20 NOTE — Progress Notes (Signed)
ANTICOAGULATION CONSULT NOTE  Pharmacy Consult for Heparin Indication: CAD and atrial fibrillation  No Known Allergies  Patient Measurements: Height: 5\' 9"  (175.3 cm) Weight: 105.8 kg (233 lb 4 oz) IBW/kg (Calculated) : 70.7 Heparin Dosing Weight: 90 kg  Vital Signs: Temp: 98.2 F (36.8 C) (12/20 1110) Temp Source: Oral (12/20 1110) BP: 132/84 (12/20 1110) Pulse Rate: 84 (12/20 1110)  Labs: Recent Labs    08/18/20 0100 08/18/20 0339 08/18/20 0636 08/18/20 0937 08/18/20 1050 08/19/20 0503 08/20/20 0028  HGB 14.6  --   --   --   --  13.8 14.4  HCT 44.7  --   --   --   --  42.6 43.4  PLT 279  --   --   --   --  238 250  APTT 26  --   --   --   --   --   --   LABPROT 12.9  --   --   --   --   --   --   INR 1.0  --   --   --   --   --   --   HEPARINUNFRC  --   --   --  <0.10*  --  0.26* 0.32  CREATININE 1.04  --  0.87  --   --  0.87 0.94  TROPONINIHS 1,826* 5,960* 8,667*  --  11-14-1975*  --   --     Estimated Creatinine Clearance: 107.6 mL/min (by C-G formula based on SCr of 0.94 mg/dL).  Assessment: 54 y.o. male with CAD and Afib awaiting CABG for heparin.   Heparin at goal (0.32), cbc within normal limits, no bleeding noted.   Goal of Therapy:  Heparin level 0.3-0.7 units/ml Monitor platelets by anticoagulation protocol: Yes   Plan:  Continue Heparin at 1850 units/hr  57 PharmD., BCPS Clinical Pharmacist 08/20/2020 1:02 PM

## 2020-08-20 NOTE — Progress Notes (Signed)
CSW responded to [facility] consult for rental assistance. 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 He explains he does not have full time benefits and does not have FMLA. CSW and pt discussed applying for unemployment and CSW provided pt with resources for rental assistance as well as other resources for [employer].

## 2020-08-20 NOTE — Progress Notes (Signed)
Progress Note  Patient Name: Eain Mullendore Date of Encounter: 08/20/2020  [facility] Cardiologist:  O'Neal   Subjective   54 yo with history of diabetes mellitus, hypertension, obstructive sleep apnea presented with rapid atrial fibrillation. He had an non-ST segment elevation myocardial infarction.  Heart catheterization reveals three-vessel coronary artery disease.  Echocardiogram reveals at least moderate aortic stenosis. Feeling much better  is in NSR this am   Inpatient Medications    Scheduled Meds: . aspirin EC  81 mg Oral Daily  . atorvastatin  80 mg Oral Daily  . furosemide  40 mg Intravenous BID  . insulin aspart  0-15 Units Subcutaneous TID WC  . insulin aspart  0-5 Units Subcutaneous QHS  . insulin aspart  4 Units Subcutaneous TID WC  . insulin glargine  5 Units Subcutaneous QHS  . losartan  12.5 mg Oral Daily  . mouth rinse  15 mL Mouth Rinse BID  . metoprolol tartrate  25 mg Oral BID  . sodium chloride flush  3 mL Intravenous Q12H  . spironolactone  12.5 mg Oral Daily   Continuous Infusions: . sodium chloride    . heparin 1,850 Units/hr (08/20/20 0212)   PRN Meds: sodium chloride, acetaminophen, nitroGLYCERIN, ondansetron (ZOFRAN) IV, sodium chloride flush   Vital Signs    Vitals:   08/19/20 1600 08/19/20 1942 08/20/20 0033 08/20/20 0449  BP: 120/77 117/77 130/90 128/79  Pulse: 85 80 72 78  Resp: Temp: 97.7 F (36.5 C) 98.6 F (37 C) 97.9 F (36.6 C) 98.3 F (36.8 C)  TempSrc: Oral Oral Oral Oral  SpO2: 96% 96% 97% 99%  Weight:      Height:        Intake/Output Summary (Last 24 hours) at 08/20/2020 0823 Last data filed at 08/20/2020 0653 Gross per 24 hour  Intake 637.55 ml  Output 1050 ml  Net -412.45 ml   Last 3 Weights 08/18/2020 08/18/2020 08/13/2020  Weight (lbs) 233 lb 4 oz 230 lb 225 lb  Weight (kg) 105.8 kg 104.327 kg 102.059 kg      Telemetry    NSR  - Personally Reviewed  ECG     - Personally  Reviewed  Physical Exam   GEN:  Middle-aged gentleman, no acute distress Neck: No JVD Cardiac:  Regular rate S1-S2.  2/6 systolic ejection murmur the left sternal border Respiratory: Clear to auscultation bilaterally. GI: Soft, nontender, non-distended  MS: No edema; No deformity. Neuro:  Nonfocal  Psych: Normal affect   Labs    High Sensitivity Troponin:   Recent Labs  Lab 08/18/20 0100 08/18/20 0339 08/18/20 0636 08/18/20 1050  TROPONINIHS 1,826* 5,960* 8,667* 16,847*      Chemistry Recent Labs  Lab 08/18/20 0100 08/18/20 0636 08/19/20 0503 08/20/20 0028  NA 138 139 135 134*  K 3.7 4.0 3.9 3.6  CL 105 109 103 98  CO2 20* 12* 22 23  GLUCOSE 185* 151* 149* 188*  BUN CREATININE 1.04 0.87 0.87 0.94  CALCIUM 8.9 8.8* 8.9 9.2  PROT 6.9  --   --   --   ALBUMIN 3.4*  --   --   --   AST 38  --   --   --   ALT 24  --   --   --   ALKPHOS 49  --   --   --   BILITOT 0.6  --   --   --  GFRNONAA >60 >60 >60 >60  ANIONGAP 13 18* 10 13     Hematology Recent Labs  Lab 08/18/20 0100 08/19/20 0503 08/20/20 0028  WBC 17.0* 10.9* 12.9*  RBC 5.14 4.93 5.06  HGB 14.6 13.8 14.4  HCT 44.7 42.6 43.4  MCV 87.0 86.4 85.8  MCH 28.4 28.0 28.5  MCHC 32.7 32.4 33.2  RDW 12.7 13.3 13.2  PLT 279 238 250    BNP Recent Labs  Lab 08/18/20 0100  BNP 85.3     DDimer No results for input(s): DDIMER in the last 168 hours.   Radiology    CT CHEST WO CONTRAST  Result Date: 08/18/2020 CLINICAL DATA:  Respiratory failure. EXAM: CT CHEST WITHOUT CONTRAST TECHNIQUE: Multidetector CT imaging of the chest was performed following the standard protocol without IV contrast. COMPARISON:  Chest x-ray August 18, 2020 FINDINGS: Cardiovascular: Three-vessel coronary artery disease identified. The heart size is within normal limits. The thoracic aorta demonstrates very mild calcified atherosclerosis. No aneurysm. The central pulmonary arteries are normal in caliber.  Mediastinum/Nodes: No enlarged mediastinal or axillary lymph nodes. Thyroid gland, trachea, and esophagus demonstrate no significant findings. Lungs/Pleura: Central airways are normal. Diffuse patchy ground-glass opacities affecting all lobes. Interlobular septal thickening identified, particularly in the bases. The ground-glass opacities are somewhat nodular in appearance in the bases, particularly on the right. No suspicious nodules or masses although evaluation is limited due to the ground-glass opacities. Mild atelectasis seen dependently in the bases, left greater than right. Upper Abdomen: No acute abnormality. Musculoskeletal: No chest wall mass or suspicious bone lesions identified. IMPRESSION: 1. Diffuse ground-glass opacities in the lungs. Diffuse interlobular septal thickening, particularly in the bases. The findings are favored to represent pulmonary edema. Atypical infection considered less likely. Recommend clinical correlation. 2. Three-vessel coronary artery disease. 3. Mild atherosclerotic change in the thoracic aorta. Aortic Atherosclerosis (ICD10-I70.0). Electronically Signed   By: Gerome Sam III M.D   On: 08/18/2020 12:18   CARDIAC CATHETERIZATION  Addendum Date: 08/18/2020    Dist LM to Ost LAD lesion is 80% stenosed.  Mid Cx lesion is 80% stenosed.  Ost Cx lesion is 75% stenosed.  Prox RCA lesion is 80% stenosed.  LV end diastolic pressure is moderately elevated. LVEDP 24 mm Hg.  There is no aortic valve stenosis.  Tortuosity of the right subclavian artery noted.  Severe left main disease.  Plan for surgical consult.  IV Lasix for increased LVEDP. Aggressive secondary prevention.   Result Date: 08/18/2020  Dist LM to Ost LAD lesion is 80% stenosed.  Mid Cx lesion is 80% stenosed.  Ost Cx lesion is 75% stenosed.  Prox RCA lesion is 80% stenosed.  LV end diastolic pressure is moderately elevated. LVEDP 24 mm Hg.  There is no aortic valve stenosis.  Severe left main  disease.  Plan for surgical consult.  IV Lasix for increased LVEDP. Aggressive secondary prevention.   DG Chest Port 1 View  Result Date: 08/20/2020 CLINICAL DATA:  Shortness of breath EXAM: PORTABLE CHEST 1 VIEW COMPARISON:  Chest radiograph and chest CT August 18, 2020 FINDINGS: Areas of patchy ill-defined airspace opacity noted recently have partially cleared. Ill-defined opacity remains in the right upper lobe as well as in portions of the left mid lung and left base and right mid lung regions. No new opacity evident. Heart is upper normal in size with pulmonary vascularity normal. No adenopathy. No bone lesions. IMPRESSION: There is been partial clearing of ill-defined opacity bilaterally. Patchy areas of airspace opacity  remain. This appearance raises question of atypical organism pneumonia. Advise check of COVID-19 status in this regard. No new opacity evident. Stable cardiac silhouette. Electronically Signed   By: Bretta Bang III M.D.   On: 08/20/2020 07:55   ECHOCARDIOGRAM COMPLETE  Result Date: 08/18/2020    ECHOCARDIOGRAM REPORT   Patient Name:   DAVEY LIMAS Date of Exam: 08/18/2020 Medical Rec #:  [id number]         Height:       69.0 in Accession #:    [id number]        Weight:       233.2 lb Date of Birth:  [birth year]        BSA:          2.206 m Patient Age:    54 years          BP:           119/87 mmHg Patient Gender: M                 HR:           88 bpm. Exam Location:  Inpatient Procedure: 2D Echo, 3D Echo, Cardiac Doppler, Color Doppler and Strain Analysis Indications:    121-121.4 ST elevation (STEMI) and non-ST elevation (NSTEMI)                 myocardial infarction  History:        Patient has no prior history of Echocardiogram examinations.                 Acute MI, Signs/Symptoms:Chest Pain and Dyspnea; Risk                 Factors:Hypertension, Diabetes, Dyslipidemia and Sleep Apnea.  Sonographer:    Sheralyn Boatman RDCS Referring Phys: [id number] SCOTT W ROSE  Sonographer  Comments: Technically difficult study due to poor echo windows. Global longitudinal strain was attempted. IMPRESSIONS  1. Left ventricular ejection fraction, by estimation, is 45 to 50%. The left ventricle has mildly decreased function. The basal-to-mid lateral wall appears moderately hypokinetic based on limited views. The rest of the LV segments appear mildly hypokinetic.There is mild concentric left ventricular hypertrophy. Left ventricular diastolic parameters are consistent with Grade II diastolic dysfunction (pseudonormalization).  2. Right ventricular systolic function is normal. The right ventricular size is normal.  3. The mitral valve is degenerative. Mild to moderate mitral valve regurgitation. Moderate mitral annular calcification.  4. The aortic valve is tricuspid. There is moderate calcification of the aortic valve. There is moderate thickening of the aortic valve. Aortic valve regurgitation is not visualized. There is moderate aortic stenosis with AVA 1.0cm2, mean gradient , peak velocity 2.2m/s, DOI 0.34. LVOT VTI 18.  5. The inferior vena cava is normal in size with greater than 50% respiratory variability, suggesting right atrial pressure of 3 mmHg. FINDINGS  Left Ventricle: Left ventricular ejection fraction, by estimation, is 45 to 50%. The left ventricle has mildly decreased function. The basal-to-mid lateral LV sgements appear moderately hypokinetic based on limited views. The rest of the LV segments are  mildly hypokinetic. The left ventricular internal cavity size was normal in size. There is mild concentric left ventricular hypertrophy. Left ventricular diastolic parameters are consistent with Grade II diastolic dysfunction (pseudonormalization). Right Ventricle: The right ventricular size is normal. No increase in right ventricular wall thickness. Right ventricular systolic function is normal. Left Atrium: Left atrial size was normal in size. Right Atrium: Right atrial size was  normal in size. Pericardium: There is no evidence of pericardial effusion. Mitral Valve: The mitral valve is degenerative in appearance. There is moderate thickening of the mitral valve leaflet(s). There is moderate calcification of the mitral valve leaflet(s). Moderate mitral annular calcification. Mild to moderate mitral valve regurgitation. Tricuspid Valve: The tricuspid valve is normal in structure. Tricuspid valve regurgitation is trivial. Aortic Valve: The aortic valve is calcified. There is moderate calcification of the aortic valve. There is moderate thickening of the aortic valve. Aortic valve regurgitation is not visualized. There is moderate aortic stenosis with AVA 1cm2, mean gradient , peak velocity 2.65m/s, DOI 0.34. LVOT VTI 18. Pulmonic Valve: The pulmonic valve was normal in structure. Pulmonic valve regurgitation is trivial. Aorta: The aortic root and ascending aorta are structurally normal, with no evidence of dilitation. Venous: The inferior vena cava is normal in size with greater than 50% respiratory variability, suggesting right atrial pressure of 3 mmHg. IAS/Shunts: No atrial level shunt detected by color flow Doppler.  LEFT VENTRICLE PLAX 2D LVIDd:         4.80 cm      Diastology LVIDs:         3.80 cm      LV e' medial:    7.62 cm/s LV PW:         1.30 cm      LV E/e' medial:  14.4 LV IVS:        1.30 cm      LV e' lateral:   6.42 cm/s LVOT diam:     1.90 cm      LV E/e' lateral: 17.1 LV SV:         51 LV SV Index:   23 LVOT Area:     2.84 cm                              3D Volume EF: LV Volumes (MOD)            3D EF:        50 % LV vol d, MOD A2C: 88.1 ml  LV EDV:       168 ml LV vol d, MOD A4C: 103.0 ml LV ESV:       84 ml LV vol s, MOD A2C: 46.8 ml  LV SV:        84 ml LV vol s, MOD A4C: 56.1 ml LV SV MOD A2C:     41.3 ml LV SV MOD A4C:     103.0 ml LV SV MOD BP:      49.5 ml RIGHT VENTRICLE             IVC RV S prime:     13.10 cm/s  IVC diam: 1.80 cm TAPSE (M-mode): 2.3 cm LEFT  ATRIUM             Index       RIGHT ATRIUM           Index LA diam:        4.20 cm 1.90 cm/m  RA Area:     10.60 cm LA Vol (A2C):   53.1 ml 24.08 ml/m RA Volume:   21.00 ml  9.52 ml/m LA Vol (A4C):   43.3 ml 19.63 ml/m LA Biplane Vol: 51.3 ml 23.26 ml/m  AORTIC VALVE AV Area (Vmax):    1.75 cm AV Area (Vmean):   1.69 cm AV Area (VTI):  0.96 cm AV Vmax:           274.50 cm/s AV Vmean:          191.500 cm/s AV VTI:            0.534 m AV Peak Grad:      30.1 mmHg AV Mean Grad:      17.0 mmHg LVOT Vmax:         169.00 cm/s LVOT Vmean:        114.000 cm/s LVOT VTI:          0.180 m LVOT/AV VTI ratio: 0.34  AORTA Ao Root diam: 3.20 cm Ao Asc diam:  2.80 cm MITRAL VALVE MV Area (PHT): 5.02 cm      SHUNTS MV Decel Time: 151 msec      Systemic VTI:  0.18 m MR Peak grad:    114.5 mmHg  Systemic Diam: 1.90 cm MR Mean grad:    72.0 mmHg MR Vmax:         535.00 cm/s MR Vmean:        389.0 cm/s MR PISA:         1.01 cm MR PISA Eff ROA: 7 mm MR PISA Radius:  0.40 cm MV E velocity: 110.00 cm/s MV A velocity: 56.30 cm/s MV E/A ratio:  1.95 Laurance Flatten MD Electronically signed by Laurance Flatten MD Signature Date/Time: 08/18/2020/12:47:02 PM    Final    VAS US DOPPLER PRE CABG  Result Date: 08/19/2020 PREOPERATIVE VASCULAR EVALUATION  Indications:      Pre-CABG. Risk Factors:     Hypertension, Diabetes, coronary artery disease. Comparison Study: No prior study Performing Technologist: Sherren Kerns RVS  Examination Guidelines: A complete evaluation includes B-mode imaging, spectral Doppler, color Doppler, and power Doppler as needed of all accessible portions of each vessel. Bilateral testing is considered an integral part of a complete examination. Limited examinations for reoccurring indications may be performed as noted.  Right Carotid Findings: +----------+--------+--------+--------+------------+--------+           PSV cm/sEDV cm/sStenosisDescribe    Comments  +----------+--------+--------+--------+------------+--------+ CCA Prox  98      23              heterogenous         +----------+--------+--------+--------+------------+--------+ CCA Distal114     36              heterogenous         +----------+--------+--------+--------+------------+--------+ ICA Prox  91      29      Normal  heterogenous         +----------+--------+--------+--------+------------+--------+ ICA Distal80      31                                   +----------+--------+--------+--------+------------+--------+ ECA       131     27                                   +----------+--------+--------+--------+------------+--------+ +----------+--------+-------+--------+------------+           PSV cm/sEDV cmsDescribeArm Pressure +----------+--------+-------+--------+------------+ Subclavian59                                  +----------+--------+-------+--------+------------+ +---------+--------+--+--------+--+ VertebralPSV cm/s45EDV cm/s13 +---------+--------+--+--------+--+ Left Carotid Findings: +----------+--------+--------+--------+----------------------+--------+  PSV cm/sEDV cm/sStenosisDescribe              Comments +----------+--------+--------+--------+----------------------+--------+ CCA Prox  105     30              heterogenous                   +----------+--------+--------+--------+----------------------+--------+ CCA Distal120     42              heterogenous                   +----------+--------+--------+--------+----------------------+--------+ ICA Prox  107     35      1-39%   irregular and calcific         +----------+--------+--------+--------+----------------------+--------+ ICA Distal67      30                                             +----------+--------+--------+--------+----------------------+--------+ ECA       159     24                                              +----------+--------+--------+--------+----------------------+--------+ +----------+--------+--------+--------+------------+ SubclavianPSV cm/sEDV cm/sDescribeArm Pressure +----------+--------+--------+--------+------------+           164                                  +----------+--------+--------+--------+------------+ +---------+--------+--+--------+--+ VertebralPSV cm/s67EDV cm/s23 +---------+--------+--+--------+--+  ABI Findings: +--------+------------------+-----+-----------+--------+ Right   Rt Pressure (mmHg)IndexWaveform   Comment  +--------+------------------+-----+-----------+--------+ [id number]                    triphasic           +--------+------------------+-----+-----------+--------+ PTA     154               1.18 multiphasic         +--------+------------------+-----+-----------+--------+ DP      137               1.05 multiphasic         +--------+------------------+-----+-----------+--------+ +--------+------------------+-----+-----------+-------+ Left    Lt Pressure (mmHg)IndexWaveform   Comment +--------+------------------+-----+-----------+-------+ [id number]                    triphasic          +--------+------------------+-----+-----------+-------+ PTA     152               1.16 multiphasic        +--------+------------------+-----+-----------+-------+ DP      124               0.95 multiphasic        +--------+------------------+-----+-----------+-------+ +-------+---------------+----------------+ ABI/TBIToday's ABI/TBIPrevious ABI/TBI +-------+---------------+----------------+ Right  1.18                            +-------+---------------+----------------+ Left   1.16                            +-------+---------------+----------------+  Right Doppler Findings: +--------+--------+-----+---------+--------+ Site    PressureIndexDoppler  Comments +--------+--------+-----+---------+--------+  [id number]  triphasic         +--------+--------+-----+---------+--------+ Radial               triphasic         +--------+--------+-----+---------+--------+ Ulnar                triphasic         +--------+--------+-----+---------+--------+  Left Doppler Findings: +--------+--------+-----+---------+--------+ Site    PressureIndexDoppler  Comments +--------+--------+-----+---------+--------+ [id number]          triphasic         +--------+--------+-----+---------+--------+ Radial               triphasic         +--------+--------+-----+---------+--------+ Ulnar                triphasic         +--------+--------+-----+---------+--------+  Summary: Right Carotid: Velocities in the right ICA are consistent with a 1-39% stenosis. Left Carotid: Velocities in the left ICA are consistent with a 1-39% stenosis. Vertebrals:  Bilateral vertebral arteries demonstrate antegrade flow. Subclavians: Normal flow hemodynamics were seen in bilateral subclavian              arteries. Right ABI: Resting right ankle-brachial index is within normal range. No evidence of significant right lower extremity arterial disease. Left ABI: Resting left ankle-brachial index is within normal range. No evidence of significant left lower extremity arterial disease. Right Upper Extremity: Doppler waveforms remain within normal limits with right radial compression. Doppler waveforms decrease <50% with right ulnar compression. Left Upper Extremity: Doppler waveforms remain within normal limits with left radial compression. Doppler waveform obliterate with left ulnar compression.  Electronically signed by Sherald Hesshristopher Clark MD on 08/19/2020 at 3:40:21 PM.    Final     Cardiac Studies     Patient Profile     54 y.o. male admitted with atrial fibrillation.  He is now been found to have three-vessel coronary artery disease as well as moderate aortic stenosis.  Assessment & Plan    1.   Coronary artery disease.  He is scheduled for coronary artery bypass grafting later this week.  I agree with Dr. Bufford Buttner'Neill that he should probably get left atrial clipping as well as a Maze procedure.  We will also quite likely need aortic valve replacement.  2.  Atrial fibrillation: He is converted back to sinus rhythm.  Continue heparin for now.  3.  Acute on chronic combined systolic and diastolic congestive heart failure::   He is diuresed 2.3 L so far during this admission.  His lungs sound clear this morning. Potassium is 3.6.  Will increase his potassium supplement.  4.  Diabetes mellitus: Glucose this morning is 188. Will ask the diabetes coordinator to assist with his treatment plan    For questions or updates, please contact [facility] Please consult [email or web address] for contact info under        Signed, Kristeen MissPhilip Khair Chasteen, MD  08/20/2020, 8:23 AM

## 2020-08-20 NOTE — Progress Notes (Signed)
[id number] Pt had [facility] booklet. Turned to page to staying in the tube and discussed sternal precautions. Discussed importance of IS and walking after surgery. Gave IS and pt able to demonstrate 2500 ml correctly. Gave care guide. Discussed how to view pre op video. Pt stated he lives alone, so may need to see case manager after surgery to see if d/c needs. Did not walk due to LM disease. Will follow up after surgery. Luetta Nutting RN BSN 08/20/2020 2:51 PM

## 2020-08-20 NOTE — Progress Notes (Signed)
Inpatient Diabetes Program Recommendations  AACE/ADA: New Consensus Statement on Inpatient Glycemic Control (2015)  Target Ranges:  Prepandial:   less than 140 mg/dL      Peak postprandial:   less than 180 mg/dL (1-2 hours)      Critically ill patients:  140 - 180 mg/dL   Lab Results  Component Value Date   GLUCAP 158 (H) 08/20/2020   HGBA1C 7.4 (H) 08/18/2020    Review of Glycemic Control Results for BURNIS, HALLING (MRN [id number]) as of 08/20/2020 14:31  Ref. Range 08/19/2020 16:15 08/19/2020 21:06 08/20/2020 06:52 08/20/2020 11:07  Glucose-Capillary Latest Ref Range: 70 - 99 mg/dL 336 (H) 122 (H) 449 (H) 158 (H)   Diabetes history: DM 2 Outpatient Diabetes medications: Trulicity 1.5 weekly, Glucotrol 5 mg daily, Metformin 1000 mg bid Current orders for Inpatient glycemic control:  Novolog moderate tid with meals and HS Inpatient Diabetes Program Recommendations:   Referral received. Blood sugars currently well controlled.  Will follow.   Thanks  Beryl Meager, RN, BC-ADM Inpatient Diabetes Coordinator Pager [phone] (8a-5p)

## 2020-08-21 ENCOUNTER — Inpatient Hospital Stay (HOSPITAL_COMMUNITY): Payer: Self-pay

## 2020-08-21 LAB — COMPREHENSIVE METABOLIC PANEL
ALT: 29 U/L (ref 0–44)
AST: 24 U/L (ref 15–41)
Albumin: 3.7 g/dL (ref 3.5–5.0)
Alkaline Phosphatase: 65 U/L (ref 38–126)
Anion gap: 12 (ref 5–15)
BUN: 15 mg/dL (ref 6–20)
CO2: 25 mmol/L (ref 22–32)
Calcium: 9.3 mg/dL (ref 8.9–10.3)
Chloride: 91 mmol/L — ABNORMAL LOW (ref 98–111)
Creatinine, Ser: 0.97 mg/dL (ref 0.61–1.24)
GFR, Estimated: >60 mL/min (ref >60)
Glucose, Bld: 229 mg/dL — ABNORMAL HIGH (ref 70–99)
Potassium: 3.8 mmol/L (ref 3.5–5.1)
Sodium: 128 mmol/L — ABNORMAL LOW (ref 135–145)
Total Bilirubin: 1 mg/dL (ref 0.3–1.2)
Total Protein: 7.5 g/dL (ref 6.5–8.1)

## 2020-08-21 LAB — BASIC METABOLIC PANEL
Anion gap: 13 (ref 5–15)
BUN: 15 mg/dL (ref 6–20)
CO2: 22 mmol/L (ref 22–32)
Calcium: 9.5 mg/dL (ref 8.9–10.3)
Chloride: 98 mmol/L (ref 98–111)
Creatinine, Ser: 0.89 mg/dL (ref 0.61–1.24)
GFR, Estimated: >60 mL/min (ref >60)
Glucose, Bld: 262 mg/dL — ABNORMAL HIGH (ref 70–99)
Potassium: 3.8 mmol/L (ref 3.5–5.1)
Sodium: 133 mmol/L — ABNORMAL LOW (ref 135–145)

## 2020-08-21 LAB — BLOOD GAS, ARTERIAL
Acid-Base Excess: 0.8 mmol/L (ref 0.0–2.0)
Bicarbonate: 24.4 mmol/L (ref 20.0–28.0)
FIO2: 21
O2 Saturation: 92.9 %
Patient temperature: 36.5
pCO2 arterial: 35.1 mmHg (ref 32.0–48.0)
pH, Arterial: 7.454 — ABNORMAL HIGH (ref 7.350–7.450)
pO2, Arterial: 64.6 mmHg — ABNORMAL LOW (ref 83.0–108.0)

## 2020-08-21 LAB — GLUCOSE, CAPILLARY
Glucose-Capillary: 165 mg/dL — ABNORMAL HIGH (ref 70–99)
Glucose-Capillary: 214 mg/dL — ABNORMAL HIGH (ref 70–99)
Glucose-Capillary: 191 mg/dL — ABNORMAL HIGH (ref 70–99)
Glucose-Capillary: 164 mg/dL — ABNORMAL HIGH (ref 70–99)

## 2020-08-21 LAB — TYPE AND SCREEN
ABO/RH(D): O NEG
Antibody Screen: NEGATIVE

## 2020-08-21 LAB — CBC
HCT: 43.8 % (ref 39.0–52.0)
Hemoglobin: 15.3 g/dL (ref 13.0–17.0)
MCH: 29.7 pg (ref 26.0–34.0)
MCHC: 34.9 g/dL (ref 30.0–36.0)
MCV: 85 fL (ref 80.0–100.0)
Platelets: 244 10*3/uL (ref 150–400)
RBC: 5.15 MIL/uL (ref 4.22–5.81)
RDW: 12.8 % (ref 11.5–15.5)
WBC: 11.8 10*3/uL — ABNORMAL HIGH (ref 4.0–10.5)
nRBC: 0 % (ref 0.0–0.2)

## 2020-08-21 LAB — URINALYSIS, ROUTINE W REFLEX MICROSCOPIC
Bacteria, UA: NONE SEEN
Bilirubin Urine: NEGATIVE
Glucose, UA: >=500 mg/dL — AB
Hgb urine dipstick: NEGATIVE
Ketones, ur: NEGATIVE mg/dL
Leukocytes,Ua: NEGATIVE
Nitrite: NEGATIVE
Protein, ur: NEGATIVE mg/dL
Specific Gravity, Urine: 1.008 (ref 1.005–1.030)
pH: 6 (ref 5.0–8.0)

## 2020-08-21 LAB — ABO/RH: ABO/RH(D): O NEG

## 2020-08-21 LAB — HEPARIN LEVEL (UNFRACTIONATED): Heparin Unfractionated: 0.31 IU/mL (ref 0.30–0.70)

## 2020-08-21 MED ORDER — TRANEXAMIC ACID (OHS) PUMP PRIME SOLUTION
2.0000 mg/kg | INTRAVENOUS | Status: DC
  Filled 2020-08-21 (×2): qty 2.12

## 2020-08-21 MED ORDER — MILRINONE LACTATE IN DEXTROSE 20-5 MG/100ML-% IV SOLN
0.3000 ug/kg/min | INTRAVENOUS | Status: AC
  Administered 2020-08-22: .25 ug/kg/min via INTRAVENOUS
  Filled 2020-08-21: qty 100

## 2020-08-21 MED ORDER — NITROGLYCERIN IN D5W 200-5 MCG/ML-% IV SOLN
2.0000 ug/min | INTRAVENOUS | Status: AC
  Administered 2020-08-22: 5 ug/min via INTRAVENOUS
  Filled 2020-08-21: qty 250

## 2020-08-21 MED ORDER — DEXMEDETOMIDINE HCL IN NACL 400 MCG/100ML IV SOLN
0.1000 ug/kg/h | INTRAVENOUS | Status: AC
  Administered 2020-08-22: .4 ug/kg/h via INTRAVENOUS
  Filled 2020-08-21: qty 100

## 2020-08-21 MED ORDER — INSULIN REGULAR(HUMAN) IN NACL 100-0.9 UT/100ML-% IV SOLN
INTRAVENOUS | Status: AC
  Administered 2020-08-22: 1 [IU]/h via INTRAVENOUS
  Filled 2020-08-21: qty 100

## 2020-08-21 MED ORDER — EPINEPHRINE HCL 5 MG/250ML IV SOLN IN NS
0.0000 ug/min | INTRAVENOUS | Status: DC
  Filled 2020-08-21: qty 250

## 2020-08-21 MED ORDER — BISACODYL 5 MG PO TBEC: 5.0000 mg | DELAYED_RELEASE_TABLET | Freq: Once | ORAL | Status: DC

## 2020-08-21 MED ORDER — SODIUM CHLORIDE 0.9 % IV SOLN
750.0000 mg | INTRAVENOUS | Status: AC
  Administered 2020-08-22: 750 mg via INTRAVENOUS
  Filled 2020-08-21 (×2): qty 750

## 2020-08-21 MED ORDER — SODIUM CHLORIDE 0.9 % IV SOLN
INTRAVENOUS | Status: DC
  Filled 2020-08-21: qty 30

## 2020-08-21 MED ORDER — CHLORHEXIDINE GLUCONATE CLOTH 2 % EX PADS
6.0000 | MEDICATED_PAD | Freq: Once | CUTANEOUS | Status: AC
  Administered 2020-08-22: 6 via TOPICAL

## 2020-08-21 MED ORDER — TRANEXAMIC ACID 1000 MG/10ML IV SOLN
1.5000 mg/kg/h | INTRAVENOUS | Status: AC
  Administered 2020-08-22: 1.5 mg/kg/h via INTRAVENOUS
  Filled 2020-08-21: qty 25

## 2020-08-21 MED ORDER — VANCOMYCIN HCL 1500 MG/300ML IV SOLN
1500.0000 mg | INTRAVENOUS | Status: AC
  Administered 2020-08-22: 1500 mg via INTRAVENOUS
  Filled 2020-08-21: qty 300

## 2020-08-21 MED ORDER — FUROSEMIDE 10 MG/ML IJ SOLN: 40.0000 mg | Freq: Every day | INTRAMUSCULAR | Status: DC

## 2020-08-21 MED ORDER — PHENYLEPHRINE HCL-NACL 20-0.9 MG/250ML-% IV SOLN
30.0000 ug/min | INTRAVENOUS | Status: AC
  Administered 2020-08-22: 20 ug/min via INTRAVENOUS
  Filled 2020-08-21: qty 250

## 2020-08-21 MED ORDER — CHLORHEXIDINE GLUCONATE CLOTH 2 % EX PADS
6.0000 | MEDICATED_PAD | Freq: Once | CUTANEOUS | Status: AC
  Administered 2020-08-21: 6 via TOPICAL

## 2020-08-21 MED ORDER — SODIUM CHLORIDE 0.9 % IV SOLN
1.5000 g | INTRAVENOUS | Status: AC
  Administered 2020-08-22: 1.5 g via INTRAVENOUS
  Filled 2020-08-21: qty 1.5

## 2020-08-21 MED ORDER — TEMAZEPAM 15 MG PO CAPS
15.0000 mg | ORAL_CAPSULE | Freq: Once | ORAL | Status: AC | PRN
  Administered 2020-08-21: 15 mg via ORAL
  Filled 2020-08-21: qty 1

## 2020-08-21 MED ORDER — INSULIN GLARGINE 100 UNIT/ML ~~LOC~~ SOLN
5.0000 [IU] | Freq: Once | SUBCUTANEOUS | Status: AC
  Administered 2020-08-21: 5 [IU] via SUBCUTANEOUS
  Filled 2020-08-21: qty 0.05

## 2020-08-21 MED ORDER — PLASMA-LYTE 148 IV SOLN
INTRAVENOUS | Status: DC
  Filled 2020-08-21: qty 2.5

## 2020-08-21 MED ORDER — TRANEXAMIC ACID (OHS) BOLUS VIA INFUSION
15.0000 mg/kg | INTRAVENOUS | Status: AC
  Administered 2020-08-22: 1587 mg via INTRAVENOUS
  Filled 2020-08-21: qty 1587

## 2020-08-21 MED ORDER — POTASSIUM CHLORIDE 2 MEQ/ML IV SOLN
80.0000 meq | INTRAVENOUS | Status: DC
  Filled 2020-08-21: qty 40

## 2020-08-21 MED ORDER — CHLORHEXIDINE GLUCONATE 0.12 % MT SOLN
15.0000 mL | Freq: Once | OROMUCOSAL | Status: AC
  Administered 2020-08-22: 15 mL via OROMUCOSAL
  Filled 2020-08-21: qty 15

## 2020-08-21 MED ORDER — METOPROLOL TARTRATE 12.5 MG HALF TABLET
12.5000 mg | ORAL_TABLET | Freq: Once | ORAL | Status: AC
  Administered 2020-08-22: 12.5 mg via ORAL
  Filled 2020-08-21: qty 1

## 2020-08-21 MED ORDER — NOREPINEPHRINE 4 MG/250ML-% IV SOLN
0.0000 ug/min | INTRAVENOUS | Status: DC
  Filled 2020-08-21: qty 250

## 2020-08-21 MED ORDER — MAGNESIUM SULFATE 50 % IJ SOLN
40.0000 meq | INTRAMUSCULAR | Status: DC
  Filled 2020-08-21: qty 9.85

## 2020-08-21 NOTE — Progress Notes (Signed)
Inpatient Diabetes Program Recommendations  AACE/ADA: New Consensus Statement on Inpatient Glycemic Control (2015)  Target Ranges:  Prepandial:   less than 140 mg/dL      Peak postprandial:   less than 180 mg/dL (1-2 hours)      Critically ill patients:  140 - 180 mg/dL   Lab Results  Component Value Date   GLUCAP 214 (H) 08/21/2020   HGBA1C 7.4 (H) 08/18/2020    Review of Glycemic Control Results for Johnny Nichols, Johnny Nichols (MRN [id number]) as of 08/21/2020 12:17  Ref. Range 08/20/2020 11:07 08/20/2020 16:04 08/20/2020 21:13 08/21/2020 06:11 08/21/2020 11:15  Glucose-Capillary Latest Ref Range: 70 - 99 mg/dL 675 (H) 449 (H) 201 (H) 165 (H) 214 (H)  Diabetes history: DM 2 Outpatient Diabetes medications: Trulicity 1.5 weekly, Glucotrol 5 mg daily, Metformin 1000 mg bid Current orders for Inpatient glycemic control:  Novolog moderate tid with meals and HS Lantus 5 units daily Novolog 4 units tid with meals  Inpatient Diabetes Program Recommendations:    Consider increasing Lantus to 12 units daily.   Thanks,  Beryl Meager, RN, BC-ADM Inpatient Diabetes Coordinator Pager [phone] (8a-5p)

## 2020-08-21 NOTE — Anesthesia Preprocedure Evaluation (Addendum)
Anesthesia Evaluation  Patient identified by MRN, date of birth, ID band Patient awake    Reviewed: Allergy & Precautions, NPO status , Patient's Chart, lab work & pertinent test results  Airway Mallampati: II  TM Distance: >3 FB Neck ROM: Full    Dental  (+) Teeth Intact   Pulmonary sleep apnea ,    Pulmonary exam normal        Cardiovascular hypertension, Pt. on medications + Past MI  + Valvular Problems/Murmurs AS  Rhythm:Regular Rate:Normal     Neuro/Psych negative neurological ROS  negative psych ROS   GI/Hepatic negative GI ROS, Neg liver ROS,   Endo/Other  diabetes, Well Controlled, Type 2, Oral Hypoglycemic Agents  Renal/GU negative Renal ROS  negative genitourinary   Musculoskeletal negative musculoskeletal ROS (+)   Abdominal (+)  Abdomen: soft. Bowel sounds: normal.  Peds  Hematology negative hematology ROS (+)   Anesthesia Other Findings   Reproductive/Obstetrics                            Anesthesia Physical Anesthesia Plan  ASA: IV  Anesthesia Plan: General   Post-op Pain Management:    Induction: Intravenous  PONV Risk Score and Plan: 2 and Ondansetron, Midazolam and Treatment may vary due to age or medical condition  Airway Management Planned: Mask and Oral ETT  Additional Equipment: Arterial line, CVP, PA Cath, TEE, 3D TEE and Ultrasound Guidance Line Placement  Intra-op Plan:   Post-operative Plan: Post-operative intubation/ventilation  Informed Consent: I have reviewed the patients History and Physical, chart, labs and discussed the procedure including the risks, benefits and alternatives for the proposed anesthesia with the patient or authorized representative who has indicated his/her understanding and acceptance.     Dental advisory given  Plan Discussed with: CRNA  Anesthesia Plan Comments: (Lab Results      Component                Value                Date                      WBC                      11.8 (H)            08/21/2020                HGB                      15.3                08/21/2020                HCT                      43.8                08/21/2020                MCV                      85.0                08/21/2020                PLT  244                 08/21/2020           Lab Results      Component                Value               Date                      NA                       128 (L)             08/21/2020                K                        3.8                 08/21/2020                CO2                      25                  08/21/2020                GLUCOSE                  229 (H)             08/21/2020                BUN                      15                  08/21/2020                CREATININE               0.97                08/21/2020                CALCIUM                  9.3                 08/21/2020                GFRNONAA                 >60                 08/21/2020                GFRAA                    112                 05/14/2020           ECHO 08/18/20: 1. Left ventricular ejection fraction, by estimation, is 45 to 50%. The  left ventricle has mildly decreased function. The basal-to-mid lateral  wall appears moderately hypokinetic based on limited views. The rest of  the LV segments appear mildly  hypokinetic.There is mild  concentric left ventricular hypertrophy. Left  ventricular diastolic parameters are consistent with Grade II diastolic  dysfunction (pseudonormalization).  2. Right ventricular systolic function is normal. The right ventricular  size is normal.  3. The mitral valve is degenerative. Mild to moderate mitral valve  regurgitation. Moderate mitral annular calcification.  4. The aortic valve is tricuspid. There is moderate calcification of the  aortic valve. There is moderate thickening of the aortic valve. Aortic  valve  regurgitation is not visualized. There is moderate aortic stenosis  with AVA 1.0cm2, mean gradient  , peak velocity 2.33m/s, DOI 0.34. LVOT VTI 18.  5. The inferior vena cava is normal in size with greater than 50%  respiratory variability, suggesting right atrial pressure of 3 mmHg. Cath 08/18/20:  Dist LM to Ost LAD lesion is 80% stenosed.  Mid Cx lesion is 80% stenosed.  Ost Cx lesion is 75% stenosed.  Prox RCA lesion is 80% stenosed.  LV end diastolic pressure is moderately elevated. LVEDP 24 mm Hg.  There is no aortic valve stenosis.  Tortuosity of the right subclavian artery noted.  )        Anesthesia Quick Evaluation

## 2020-08-21 NOTE — Hospital Course (Addendum)
Hospital Course: Patient was extubated later the evening of surgery without difficulty. He was then placed on 4 liters of oxygen via [place]. Patient remained afebrile and hemodynamically stable. He was weaned off Nitro, Dopamine, and Milrinone drips. He was started on low dose Imdur for left radial artery harvest and low dose Lopressor. Theone Murdoch, a line chest tubes, and foley were removed early in his post operative course. He was volume overloaded and diuresed accordingly. He was weaned off the Insulin drip. His pre op HGA1C 7.4. He was on Metformin 1000 mg bid and Glipizide 5 mg bid prior to surgery. These will not be restarted until later in his hospital course. Of note, he had a STEMI on presentation so patient should be on DOAC (Plavix 75 mg and ec asa 81 mg).

## 2020-08-21 NOTE — Progress Notes (Signed)
Progress Note  Patient Name: Johnny Nichols Date of Encounter: 08/21/2020  [facility] Cardiologist:  O'Neal   Subjective   54 yo with history of diabetes mellitus, hypertension, obstructive sleep apnea presented with rapid atrial fibrillation. He had an non-ST segment elevation myocardial infarction.  Heart catheterization reveals three-vessel coronary artery disease.  Echocardiogram reveals at least moderate aortic stenosis.  Continues to feel well  The plan is for CABG, AVR, clip LAA tomorrow    Inpatient Medications    Scheduled Meds: . aspirin EC  81 mg Oral Daily  . atorvastatin  80 mg Oral Daily  . furosemide  40 mg Intravenous BID  . insulin aspart  0-15 Units Subcutaneous TID WC  . insulin aspart  0-5 Units Subcutaneous QHS  . insulin aspart  4 Units Subcutaneous TID WC  . insulin glargine  5 Units Subcutaneous QHS  . losartan  12.5 mg Oral Daily  . mouth rinse  15 mL Mouth Rinse BID  . metoprolol tartrate  25 mg Oral BID  . potassium chloride  10 mEq Oral BID  . sodium chloride flush  3 mL Intravenous Q12H  . spironolactone  12.5 mg Oral Daily   Continuous Infusions: . sodium chloride    . heparin 1,850 Units/hr (08/21/20 0304)   PRN Meds: sodium chloride, acetaminophen, nitroGLYCERIN, ondansetron (ZOFRAN) IV, sodium chloride flush   Vital Signs    Vitals:   08/20/20 1948 08/20/20 2311 08/21/20 0302 08/21/20 0739  BP: (!) 138/98 122/79 (!) 129/96 (!) 137/93  Pulse: 83 81 68 82  Resp: 18 15 15 19   Temp: 98.6 F (37 C) 98.7 F (37.1 C) 98 F (36.7 C) 98.2 F (36.8 C)  TempSrc: Oral Oral Oral Oral  SpO2: 95% 96% 97% 92%  Weight:      Height:        Intake/Output Summary (Last 24 hours) at 08/21/2020 0834 Last data filed at 08/21/2020 0648 Gross per 24 hour  Intake 1638.99 ml  Output 2175 ml  Net -536.01 ml   Last 3 Weights 08/18/2020 08/18/2020 08/13/2020  Weight (lbs) 233 lb 4 oz 230 lb 225 lb  Weight (kg) 105.8 kg 104.327 kg  102.059 kg      Telemetry    NSR  - Personally Reviewed  ECG     - Personally Reviewed  Physical Exam   Physical Exam: Blood pressure (!) 137/93, pulse 82, temperature 98.2 F (36.8 C), temperature source Oral, resp. rate 19, height 5\' 9"  (1.753 m), weight 105.8 kg, SpO2 92 %.  GEN:   Middle age man HEENT: Normal NECK: No JVD; No carotid bruits LYMPHATICS: No lymphadenopathy CARDIAC: RRR , 1-2/6 systlic murmur  RESPIRATORY:  Clear to auscultation without rales, wheezing or rhonchi  ABDOMEN: Soft, non-tender, non-distended MUSCULOSKELETAL:  No edema; No deformity  SKIN: Warm and dry NEUROLOGIC:  Alert and oriented x 3   Labs    High Sensitivity Troponin:   Recent Labs  Lab 08/18/20 0100 08/18/20 0339 08/18/20 0636 08/18/20 1050  TROPONINIHS 1,826* 5,960* 8,667* 16,847*      Chemistry Recent Labs  Lab 08/18/20 0100 08/18/20 0636 08/19/20 0503 08/20/20 0028 08/21/20 0043  NA 138   < > 135 134* 133*  K 3.7   < > 3.9 3.6 3.8  CL 105   < > 103 98 98  CO2 20*   < > 22 23 22   GLUCOSE 185*   < > 149* 188* 262*  BUN 19   < > 12  16 15  CREATININE 1.04   < > 0.87 0.94 0.89  CALCIUM 8.9   < > 8.9 9.2 9.5  PROT 6.9  --   --   --   --   ALBUMIN 3.4*  --   --   --   --   AST 38  --   --   --   --   ALT 24  --   --   --   --   ALKPHOS 49  --   --   --   --   BILITOT 0.6  --   --   --   --   GFRNONAA >60   < > >60 >60 >60  ANIONGAP 13   < > 10 13 13    < > = values in this interval not displayed.     Hematology Recent Labs  Lab 08/19/20 0503 08/20/20 0028 08/21/20 0043  WBC 10.9* 12.9* 11.8*  RBC 4.93 5.06 5.15  HGB 13.8 14.4 15.3  HCT 42.6 43.4 43.8  MCV 86.4 85.8 85.0  MCH 28.0 28.5 29.7  MCHC 32.4 33.2 34.9  RDW 13.3 13.2 12.8  PLT 238 250 244    BNP Recent Labs  Lab 08/18/20 0100  BNP 85.3     DDimer No results for input(s): DDIMER in the last 168 hours.   Radiology    DG Chest Port 1 View  Result Date: 08/20/2020 CLINICAL DATA:   Shortness of breath EXAM: PORTABLE CHEST 1 VIEW COMPARISON:  Chest radiograph and chest CT August 18, 2020 FINDINGS: Areas of patchy ill-defined airspace opacity noted recently have partially cleared. Ill-defined opacity remains in the right upper lobe as well as in portions of the left mid lung and left base and right mid lung regions. No new opacity evident. Heart is upper normal in size with pulmonary vascularity normal. No adenopathy. No bone lesions. IMPRESSION: There is been partial clearing of ill-defined opacity bilaterally. Patchy areas of airspace opacity remain. This appearance raises question of atypical organism pneumonia. Advise check of COVID-19 status in this regard. No new opacity evident. Stable cardiac silhouette. Electronically Signed   By: August 20, 2020 III M.D.   On: 08/20/2020 07:55   VAS 08/22/2020 DOPPLER PRE CABG  Result Date: 08/19/2020 PREOPERATIVE VASCULAR EVALUATION  Indications:      Pre-CABG. Risk Factors:     Hypertension, Diabetes, coronary artery disease. Comparison Study: No prior study Performing Technologist: 08/21/2020 RVS  Examination Guidelines: A complete evaluation includes B-mode imaging, spectral Doppler, color Doppler, and power Doppler as needed of all accessible portions of each vessel. Bilateral testing is considered an integral part of a complete examination. Limited examinations for reoccurring indications may be performed as noted.  Right Carotid Findings: +----------+--------+--------+--------+------------+--------+           PSV cm/sEDV cm/sStenosisDescribe    Comments +----------+--------+--------+--------+------------+--------+ CCA Prox  98      23              heterogenous         +----------+--------+--------+--------+------------+--------+ CCA Distal114     36              heterogenous         +----------+--------+--------+--------+------------+--------+ ICA Prox  91      29      Normal  heterogenous          +----------+--------+--------+--------+------------+--------+ ICA Distal80      31                                   +----------+--------+--------+--------+------------+--------+  ECA       131     27                                   +----------+--------+--------+--------+------------+--------+ +----------+--------+-------+--------+------------+           PSV cm/sEDV cmsDescribeArm Pressure +----------+--------+-------+--------+------------+ Subclavian59                                  +----------+--------+-------+--------+------------+ +---------+--------+--+--------+--+ VertebralPSV cm/s45EDV cm/s13 +---------+--------+--+--------+--+ Left Carotid Findings: +----------+--------+--------+--------+----------------------+--------+           PSV cm/sEDV cm/sStenosisDescribe              Comments +----------+--------+--------+--------+----------------------+--------+ CCA Prox  105     30              heterogenous                   +----------+--------+--------+--------+----------------------+--------+ CCA Distal120     42              heterogenous                   +----------+--------+--------+--------+----------------------+--------+ ICA Prox  107     35      1-39%   irregular and calcific         +----------+--------+--------+--------+----------------------+--------+ ICA Distal67      30                                             +----------+--------+--------+--------+----------------------+--------+ ECA       159     24                                             +----------+--------+--------+--------+----------------------+--------+ +----------+--------+--------+--------+------------+ SubclavianPSV cm/sEDV cm/sDescribeArm Pressure +----------+--------+--------+--------+------------+           164                                  +----------+--------+--------+--------+------------+ +---------+--------+--+--------+--+  VertebralPSV cm/s67EDV cm/s23 +---------+--------+--+--------+--+  ABI Findings: +--------+------------------+-----+-----------+--------+ Right   Rt Pressure (mmHg)IndexWaveform   Comment  +--------+------------------+-----+-----------+--------+ [id number]                    triphasic           +--------+------------------+-----+-----------+--------+ PTA     154               1.18 multiphasic         +--------+------------------+-----+-----------+--------+ DP      137               1.05 multiphasic         +--------+------------------+-----+-----------+--------+ +--------+------------------+-----+-----------+-------+ Left    Lt Pressure (mmHg)IndexWaveform   Comment +--------+------------------+-----+-----------+-------+ [id number]                    triphasic          +--------+------------------+-----+-----------+-------+ PTA     152               1.16 multiphasic        +--------+------------------+-----+-----------+-------+ DP  124               0.95 multiphasic        +--------+------------------+-----+-----------+-------+ +-------+---------------+----------------+ ABI/TBIToday's ABI/TBIPrevious ABI/TBI +-------+---------------+----------------+ Right  1.18                            +-------+---------------+----------------+ Left   1.16                            +-------+---------------+----------------+  Right Doppler Findings: +--------+--------+-----+---------+--------+ Site    PressureIndexDoppler  Comments +--------+--------+-----+---------+--------+ [id number]          triphasic         +--------+--------+-----+---------+--------+ Radial               triphasic         +--------+--------+-----+---------+--------+ Ulnar                triphasic         +--------+--------+-----+---------+--------+  Left Doppler Findings: +--------+--------+-----+---------+--------+ Site    PressureIndexDoppler  Comments  +--------+--------+-----+---------+--------+ [id number]          triphasic         +--------+--------+-----+---------+--------+ Radial               triphasic         +--------+--------+-----+---------+--------+ Ulnar                triphasic         +--------+--------+-----+---------+--------+  Summary: Right Carotid: Velocities in the right ICA are consistent with a 1-39% stenosis. Left Carotid: Velocities in the left ICA are consistent with a 1-39% stenosis. Vertebrals:  Bilateral vertebral arteries demonstrate antegrade flow. Subclavians: Normal flow hemodynamics were seen in bilateral subclavian              arteries. Right ABI: Resting right ankle-brachial index is within normal range. No evidence of significant right lower extremity arterial disease. Left ABI: Resting left ankle-brachial index is within normal range. No evidence of significant left lower extremity arterial disease. Right Upper Extremity: Doppler waveforms remain within normal limits with right radial compression. Doppler waveforms decrease <50% with right ulnar compression. Left Upper Extremity: Doppler waveforms remain within normal limits with left radial compression. Doppler waveform obliterate with left ulnar compression.  Electronically signed by Sherald Hess MD on 08/19/2020 at 3:40:21 PM.    Final     Cardiac Studies     Patient Profile     54 y.o. male admitted with atrial fibrillation.  He is now been found to have three-vessel coronary artery disease as well as moderate aortic stenosis.  Assessment & Plan    1.  Coronary artery disease.  He is scheduled for coronary artery bypass grafting later this tomorrow .  2.  Atrial fibrillation:  Currently in NSR ,  Cont heparin for now   3.  Acute on chronic combined systolic and diastolic congestive heart failure::   He is diuresed 2.9 liters so far this admission Will reduce lasix to 40 mg IV daily .   4.  Diabetes mellitus: Glucose this morning  is 188. Will ask the diabetes coordinator to assist with his treatment plan    For questions or updates, please contact [facility] Please consult [email or web address] for contact info under        Signed, Kristeen Miss, MD  08/21/2020, 8:34 AM

## 2020-08-21 NOTE — Progress Notes (Signed)
Repeat COVID is NEGATIVE. Patient scheduled for CABG/AVR/CLIP LAA in am with Dr. [name]. Patient states he is leaning towards "tissue valve" and not mechanical valve. Patient denies shortness of breath or chest pain. He is in sinus rhythm this am.

## 2020-08-21 NOTE — Plan of Care (Signed)
  Problem: Education: Goal: Knowledge of disease or condition will improve Outcome: Progressing   Problem: Activity: Goal: Risk for activity intolerance will decrease Outcome: Progressing   Problem: Pain Managment: Goal: General experience of comfort will improve Outcome: Progressing   Problem: Education: Goal: Understanding of CV disease, CV risk reduction, and recovery process will improve Outcome: Progressing   Problem: Education: Goal: Knowledge of General Education information will improve Description: Including pain rating scale, medication(s)/side effects and non-pharmacologic comfort measures Outcome: Progressing   Problem: Clinical Measurements: Goal: Cardiovascular complication will be avoided Outcome: Progressing   Problem: Coping: Goal: Level of anxiety will decrease Outcome: Progressing   Problem: Pain Managment: Goal: General experience of comfort will improve Outcome: Progressing   Problem: Skin Integrity: Goal: Risk for impaired skin integrity will decrease Outcome: Progressing

## 2020-08-21 NOTE — Progress Notes (Signed)
[facility] DAILY ICU PROGRESS NOTE                   [address]            [place] [address]          [phone]   3 Days Post-Op Procedure(s) (LRB): LEFT HEART CATH AND CORONARY ANGIOGRAPHY (N/A)  Total Length of Stay:  LOS: 3 days   Subjective: respiratory status has improved , no cough or sputum production  No chest pain No further afib- as far as we know only afib was on admission   Objective: Vital signs in last 24 hours: Temp:  [98 F (36.7 C)-98.7 F (37.1 C)] 98.1 F (36.7 C) (12/21 1136) Pulse Rate:  [68-83] 72 (12/21 1136) Cardiac Rhythm: Normal sinus rhythm (12/21 1136) Resp:  [14-19] 18 (12/21 1136) BP: (122-138)/(79-98) 136/91 (12/21 1136) SpO2:  [92 %-97 %] 97 % (12/21 1136)  Filed Weights   08/18/20 0046 08/18/20 0619  Weight: 104.3 kg 105.8 kg    Weight change:    Hemodynamic parameters for last 24 hours:    Intake/Output from previous day: 12/20 0701 - 12/21 0700 In: 1639 [P.O.:1160; I.V.:479] Out: 2975 [Urine:2975]  Intake/Output this shift: Total I/O In: 584.9 [P.O.:420; I.V.:164.9] Out: 1100 [Urine:1100]  Current Meds: Scheduled Meds: . aspirin EC  81 mg Oral Daily  . atorvastatin  80 mg Oral Daily  . [START ON 08/22/2020] epinephrine  0-10 mcg/min Intravenous To OR  . [START ON 08/22/2020] furosemide  40 mg Intravenous Daily  . [START ON 08/22/2020] heparin-papaverine-plasmalyte irrigation   Irrigation To OR  . insulin aspart  0-15 Units Subcutaneous TID WC  . insulin aspart  0-5 Units Subcutaneous QHS  . insulin aspart  4 Units Subcutaneous TID WC  . insulin glargine  5 Units Subcutaneous QHS  . [START ON 08/22/2020] insulin   Intravenous To OR  . losartan  12.5 mg Oral Daily  . [START ON 08/22/2020] magnesium sulfate  40 mEq Other To OR  . mouth rinse  15 mL Mouth Rinse BID  . metoprolol tartrate  25 mg Oral BID  . [START ON 08/22/2020] phenylephrine  30-200 mcg/min Intravenous To OR  . potassium chloride  10  mEq Oral BID  . [START ON 08/22/2020] potassium chloride  80 mEq Other To OR  . sodium chloride flush  3 mL Intravenous Q12H  . spironolactone  12.5 mg Oral Daily  . [START ON 08/22/2020] tranexamic acid  15 mg/kg Intravenous To OR  . [START ON 08/22/2020] tranexamic acid  2 mg/kg Intracatheter To OR     PRN Meds:.sodium chloride, acetaminophen, nitroGLYCERIN, ondansetron (ZOFRAN) IV, sodium chloride flush  General appearance: alert, cooperative and no distress Neurologic: intact Heart: regular rate and rhythm, S1, S2 normal, no murmur, click, rub or gallop Lungs: clear to auscultation bilaterally Abdomen: soft, non-tender; bowel sounds normal; no masses,  no organomegaly Extremities: extremities normal, atraumatic, no cyanosis or edema  Lab Results: CBC: Recent Labs    08/20/20 0028 08/21/20 0043  WBC 12.9* 11.8*  HGB 14.4 15.3  HCT 43.4 43.8  PLT 250 244   BMET:  Recent Labs    08/20/20 0028 08/21/20 0043  NA 134* 133*  K 3.6 3.8  CL 98 98  CO2 23 22  GLUCOSE 188* 262*  BUN 16 15  CREATININE 0.94 0.89  CALCIUM 9.2 9.5    CMET: Lab Results  Component Value Date   WBC 11.8 (H) 08/21/2020  HGB 15.3 08/21/2020   HCT 43.8 08/21/2020   PLT 244 08/21/2020   GLUCOSE 262 (H) 08/21/2020   CHOL 163 08/18/2020   TRIG 63 08/18/2020   HDL 40 (L) 08/18/2020   LDLCALC 110 (H) 08/18/2020   ALT 24 08/18/2020   AST 38 08/18/2020   NA 133 (L) 08/21/2020   K 3.8 08/21/2020   CL 98 08/21/2020   CREATININE 0.89 08/21/2020   BUN 15 08/21/2020   CO2 22 08/21/2020   TSH 0.906 08/18/2020   INR 1.0 08/18/2020   HGBA1C 7.4 (H) 08/18/2020   MICROALBUR 1.4 10/11/2015      PT/INR: No results for input(s): LABPROT, INR in the last 72 hours. Radiology: No results found. DG Chest Port 1 View  Result Date: 08/20/2020 CLINICAL DATA:  Shortness of breath EXAM: PORTABLE CHEST 1 VIEW COMPARISON:  Chest radiograph and chest CT August 18, 2020 FINDINGS: Areas of patchy  ill-defined airspace opacity noted recently have partially cleared. Ill-defined opacity remains in the right upper lobe as well as in portions of the left mid lung and left base and right mid lung regions. No new opacity evident. Heart is upper normal in size with pulmonary vascularity normal. No adenopathy. No bone lesions. IMPRESSION: There is been partial clearing of ill-defined opacity bilaterally. Patchy areas of airspace opacity remain. This appearance raises question of atypical organism pneumonia. Advise check of COVID-19 status in this regard. No new opacity evident. Stable cardiac silhouette. Electronically Signed   By: Bretta Bang III M.D.   On: 08/20/2020 07:55  I have independently reviewed the above radiology studies  and reviewed the findings with the patient.    Assessment/Plan: S/P Procedure(s) (LRB): LEFT HEART CATH AND CORONARY ANGIOGRAPHY (N/A)   Repeat covid testing negative Patient has not been vaccinated for covid Plan CABG tomorrow , poss use of left radial artery also discussed  Careful exam of the aortic valve and if significant AS confirmed the replacement of aortic valve- in spite of age patient prefers not to take coumadin long term- so will use tissue valve   The goals risks and alternatives of the planned surgical procedure CABG,AVR,Atrial Clip, poss left radial harvest  have been discussed with the patient in detail. The risks of the procedure including death, infection, heart block require pacemaker, risk of use of radial artery harvest with nerve inj, or hand ischemia   stroke, myocardial infarction, bleeding, blood transfusion have all been discussed specifically.Also have discussed   I have quoted Johnny Nichols a 3 % of perioperative mortality and a complication rate as high as 40  %. The patient's questions have been answered.Johnny Nichols is willing  to proceed with the planned procedure.      Delight Ovens 08/21/2020 2:17 PM

## 2020-08-21 NOTE — Discharge Summary (Addendum)
Physician Discharge Summary       [address]       [place] [address]             [phone]    Patient ID: Johnny Nichols MRN: [id number] DOB/AGE: [birth year] 54 y.o.  Admit date: 08/18/2020 Discharge date: 08/28/2020  Admission Diagnoses: 1. NSTEMI (non-ST elevated myocardial infarction) ([place]) 2.  Atrial fibrillation with RVR ([place]) 3. Coronary artery disease 4. Moderate aortic stenosis  Discharge Diagnoses:  1. S/p CABG x 4, LA clip 2. Expected post op blood loss anemia 3. Post op a fib 4. History of Diabetes mellitus without complication ([place]) 5. History of Essential hypertension 6. History of Hyperlipidemia 7. History of OSA (obstructive sleep apnea) 8. History of Obesity (BMI 30.0-34.9)  Consults: cardiology  Procedure (s):  Coronary artery bypass grafting x4 with the left internal mammary to the left anterior descending coronary artery, reverse saphenous vein graft to the diagonal coronary artery with left radial artery, reverse saphenous vein graft to  the circumflex coronary artery, reverse vein graft to the posterior descending coronary artery arising from the distal circumflex with left radial artery harvest and endoscopic vein harvesting, right thigh greater saphenous vein and placement of a 40 mm  AtriClip and TEE by Dr. Servando Snare on 08/22/2020.  History of Presenting Illness: Johnny Nichols works in [place] and while working today developed acute onset dyspnea associated with chest tightness around 4 pm. He went to a nearby EMS station for preliminary evaluation. At that point his symptoms had mostly resolved and his ECG, as well as VS were normal. Continued with work and was doing well until the acute dyspnea and chest tightness returned later that night around 10 pm while finishing up in "the shop". This time he felt like his lungs suddenly filled with fluid and began coughing up pink phlegm. Broke out in cold sweat as well with perfuse  diaphoresis. Felt like a panic attack. The chest discomfort is not pleuritic. He denies fever or malaise. He has been having about a week of upper respiratory congestion and mild dyspnea, but no chest pain. He denies any chest pain prior to today.   Came in via EMS, who noted him to be in AF with RVR with rates in the 140s and significant ST changes (up to 3 mm diffuse ST depression with STE in aVR). He ruled in for a NSTEMI. He was also hypoxic and on arrival continued to require 4 L [place]. Lab work was significant for a leukocytosis and a troponin of 1826. COVID/flu are both negative. CXR showed increased interstitial lung and hazy opacity in the LLL. He was started on a diltiazem infusion with bolus for the AF, as well as heparin. Also received full dose aspirin. Antibiotics given for possible CAP. He converted to sinus rhythm at 2 am this morning prior to my evaluation.   Father was believed to have had an MI at age 4 and later had an angioplasty. Mother currently has some issue with her heart, history of "heart murmur" and recently had a pacemaker following a work-up for recurrent episodes of near-syncope.   Echo showed LVEF 45-50%, basal-to-mid lateral  wall appears moderately hypokinetic, and there is moderate aortic stenosis with AVA 1.0cm2. Cardiac catheterization done 08/18/2020 showed severe left main coronary disease.  Dr. Servando Snare was consulted for consideration of coronary artery bypass grafting surgery, possible aortic valve replacement, and LA clip. Potential risks, benefits, and complications of the surgery were discussed with the  patient and he agreed to proceed with surgery. Pre operative carotid duplex US showed no significant internal carotid artery stenosis bilaterally. Patient underwent a CABG x 4 and LA clip on 08/22/2020.  Brief Hospital Course:  Patient was extubated later the evening of surgery without difficulty. He was then placed on 4 liters of oxygen via [place]. Patient remained  afebrile and hemodynamically stable. He was weaned off Nitro, Dopamine, and Milrinone drips. He was started on low dose Imdur for left radial artery harvest and low dose Lopressor. Gordy Councilman, a line chest tubes, and foley were removed early in his post operative course. He was volume overloaded and diuresed accordingly. He was weaned off the Insulin drip. His pre op HGA1C 7.4. He was on Metformin 1000 mg bid and Glipizide 5 mg bid prior to surgery. These will be restarted later in his hospital course. Of note, he had a STEMI on presentation so patient should be on DOAC (Plavix 75 mg and ec asa 81 mg). He did have post op a fib with RVR. He was put on an Amiodarone drip. He converted to sinus rhythm and was transitioned to oral Amiodarone. Lopressor was titrated accordingly. Per Dr. Servando Snare, no Coumadin as history of non compliance with medications. He was felt surgically stable for transfer from the ICU to 4E on 08/26/2020. Epicardial pacing wires were removed on 12/27. He has been transitioned to room air and has good oxygenation. Chest tube sutures will be removed the day of discharge.   Latest Vital Signs: Blood pressure 140/80, pulse 93, temperature 97.9 F (36.6 C), temperature source Oral, resp. rate 20, height 5' 9" (1.753 m), weight 100.7 kg, SpO2 95 %.  Physical Exam:  General appearance: alert, cooperative and no distress Heart: regular rate and rhythm, S1, S2 normal, no murmur, click, rub or gallop Lungs: clear to auscultation bilaterally Abdomen: soft, non-tender; bowel sounds normal; no masses,  no organomegaly Extremities: extremities normal, atraumatic, no cyanosis or edema Wound: clean and dry with small amt of drainage on distal sternum  Discharge Condition: Stable and discharged to home.  Recent laboratory studies:  Lab Results  Component Value Date   WBC 10.6 (H) 08/27/2020   HGB 12.1 (L) 08/27/2020   HCT 35.5 (L) 08/27/2020   MCV 86.0 08/27/2020   PLT 330 08/27/2020    Lab Results  Component Value Date   NA 135 08/28/2020   K 4.2 08/28/2020   CL 102 08/28/2020   CO2 24 08/28/2020   CREATININE 1.03 08/28/2020   GLUCOSE 145 (H) 08/28/2020      Diagnostic Studies: DG Chest 2 View  Result Date: 08/27/2020 CLINICAL DATA:  History: History of CABG. Additional history provided by technologist: Postop CABG, cough. Provided EXAM: CHEST - 2 VIEW COMPARISON:  Prior chest radiographs 08/25/2020 and earlier FINDINGS: Shallow inspiration radiograph. Prior median sternotomy/CABG. Stable cardiopericardial silhouette. Ill-defined opacities within the perihilar lungs and lung bases, similar to the prior exam. Suspected trace bilateral pleural effusions. No appreciable pneumothorax. No evidence of acute bony abnormality. IMPRESSION: Shallow inspiration radiograph. No significant interval change as compared to the chest radiograph of 08/25/2020. Ill-defined opacities within the perihilar lungs and lung bases, which may reflect atelectasis or pneumonia. Suspected trace bilateral pleural effusions. Electronically Signed   By: Kellie Simmering DO   On: 08/27/2020 07:59   DG Chest 2 View  Result Date: 08/21/2020 CLINICAL DATA:  54 year old male with preop chest radiograph. EXAM: CHEST - 2 VIEW COMPARISON:  Chest radiograph dated 08/20/2020. FINDINGS: The  lungs are clear. There is no pleural effusion pneumothorax. The cardiac silhouette is within limits. No acute osseous pathology. IMPRESSION: No active cardiopulmonary disease. Electronically Signed   By: Anner Crete M.D.   On: 08/21/2020 23:12   CT CHEST WO CONTRAST  Result Date: 08/18/2020 CLINICAL DATA:  Respiratory failure. EXAM: CT CHEST WITHOUT CONTRAST TECHNIQUE: Multidetector CT imaging of the chest was performed following the standard protocol without IV contrast. COMPARISON:  Chest x-ray August 18, 2020 FINDINGS: Cardiovascular: Three-vessel coronary artery disease identified. The heart size is within normal  limits. The thoracic aorta demonstrates very mild calcified atherosclerosis. No aneurysm. The central pulmonary arteries are normal in caliber. Mediastinum/Nodes: No enlarged mediastinal or axillary lymph nodes. Thyroid gland, trachea, and esophagus demonstrate no significant findings. Lungs/Pleura: Central airways are normal. Diffuse patchy ground-glass opacities affecting all lobes. Interlobular septal thickening identified, particularly in the bases. The ground-glass opacities are somewhat nodular in appearance in the bases, particularly on the right. No suspicious nodules or masses although evaluation is limited due to the ground-glass opacities. Mild atelectasis seen dependently in the bases, left greater than right. Upper Abdomen: No acute abnormality. Musculoskeletal: No chest wall mass or suspicious bone lesions identified. IMPRESSION: 1. Diffuse ground-glass opacities in the lungs. Diffuse interlobular septal thickening, particularly in the bases. The findings are favored to represent pulmonary edema. Atypical infection considered less likely. Recommend clinical correlation. 2. Three-vessel coronary artery disease. 3. Mild atherosclerotic change in the thoracic aorta. Aortic Atherosclerosis (ICD10-I70.0). Electronically Signed   By: Dorise Bullion III M.D   On: 08/18/2020 12:18   CARDIAC CATHETERIZATION  Addendum Date: 08/18/2020    Dist LM to Ost LAD lesion is 80% stenosed.  Mid Cx lesion is 80% stenosed.  Ost Cx lesion is 75% stenosed.  Prox RCA lesion is 80% stenosed.  LV end diastolic pressure is moderately elevated. LVEDP 24 mm Hg.  There is no aortic valve stenosis.  Tortuosity of the right subclavian artery noted.  Severe left main disease.  Plan for surgical consult.  IV Lasix for increased LVEDP. Aggressive secondary prevention.   Result Date: 08/18/2020  Dist LM to Ost LAD lesion is 80% stenosed.  Mid Cx lesion is 80% stenosed.  Ost Cx lesion is 75% stenosed.  Prox RCA lesion  is 80% stenosed.  LV end diastolic pressure is moderately elevated. LVEDP 24 mm Hg.  There is no aortic valve stenosis.  Severe left main disease.  Plan for surgical consult.  IV Lasix for increased LVEDP. Aggressive secondary prevention.   DG Chest Port 1 View  Result Date: 08/25/2020 CLINICAL DATA:  Chest tube EXAM: PORTABLE CHEST 1 VIEW COMPARISON:  Yesterday FINDINGS: No confident visualization of residual left apical pneumothorax. The left chest tube has been removed or is at the extreme inferior costophrenic sulcu. Right IJ sheath in stable position. Stable low volumes with prominent atelectasis. Stable cardiopericardial enlargement IMPRESSION: No visible pneumothorax.  Stable atelectasis. Electronically Signed   By: Monte Fantasia M.D.   On: 08/25/2020 07:46   DG Chest Port 1 View  Result Date: 08/24/2020 CLINICAL DATA:  Status post open heart surgery.  Chest tube. EXAM: PORTABLE CHEST 1 VIEW COMPARISON:  08/23/2020 FINDINGS: 0517 hours. Right IJ pulmonary artery catheter has been removed in the interval. Right IJ sheath remains in place. Midline mediastinal/pericardial drain has been pole. Left chest tube remains with probable tiny left apical pneumothorax. The cardio pericardial silhouette is enlarged. Bibasilar atelectasis again noted, left greater than right. Telemetry leads overlie the chest.  IMPRESSION: 1. Interval removal of pulmonary artery catheter. 2. Left chest tube with probable tiny left apical pneumothorax. 3. Cardiomegaly with bibasilar atelectasis. Electronically Signed   By: Misty Stanley M.D.   On: 08/24/2020 07:46   DG Chest Port 1 View  Result Date: 08/23/2020 CLINICAL DATA:  Post cardiac surgery EXAM: PORTABLE CHEST 1 VIEW COMPARISON:  08/22/2020 FINDINGS: Right IJ Swan-Ganz catheter tip overlies the distal main pulmonary artery. Mediastinal drain and left-sided chest tube are present. Endotracheal tube and enteric tube are no longer present. No pneumothorax. No  significant pleural effusion. Left basilar atelectasis. Stable cardiomediastinal contours. IMPRESSION: Lines and tubes as above. No pneumothorax. Left basilar atelectasis. Electronically Signed   By: Macy Mis M.D.   On: 08/23/2020 07:57   DG CHEST PORT 1 VIEW  Result Date: 08/22/2020 CLINICAL DATA:  NG tube placement. EXAM: PORTABLE CHEST 1 VIEW COMPARISON:  Similar 20 second, 2021. FINDINGS: Status post CABG and atrial clipping with median sternotomy. Similar positioning of the endotracheal tube, Swan-Ganz catheter, left-sided chest tube, mediastinal drain. A gastric tube courses below the diaphragm with the tip projecting expected region the stomach. Similar low lung volumes with left basilar atelectasis. No visible pleural effusions or pneumothorax. Similar enlarged cardiac silhouette, which is accentuated by AP portable technique. IMPRESSION: 1. Gastric tube courses below the diaphragm with the tip projecting in expected region of the stomach. Additional support devices are stable. 2. Similar low lung volumes with left basilar atelectasis. Electronically Signed   By: Margaretha Sheffield MD   On: 08/22/2020 15:25   DG Chest Port 1 View  Result Date: 08/22/2020 CLINICAL DATA:  Pneumothorax, follow-up evaluation EXAM: PORTABLE CHEST 1 VIEW COMPARISON:  December twenty-first of 2021 FINDINGS: RIGHT-sided vascular sheath with Swan-Ganz catheter in place, tip in the main pulmonary artery. Endotracheal tube approximately 3.2 cm above the carina. Chest support tube projecting over the mediastinum following median sternotomy for atrial clipping and CABG. This enters via inferior approach terminating over the mediastinum near the second sternal wire, second from the top. Gastric tube in place may be coiled within the hypopharynx, tip off the field of the radiograph. LEFT-sided chest tube in place.  No visible pneumothorax. Patchy areas of linear airspace disease and predominantly linear opacities at the lung  bases greatest in the RIGHT mid chest. Cardiomediastinal contours mildly enlarged post median sternotomy. The mild fullness of RIGHT and LEFT hilum. On limited assessment no acute skeletal process. IMPRESSION: 1. Interval sternotomy for LEFT atrial clipping and CABG with mediastinal drain and chest tube in place, no visible pneumothorax on supine radiograph. 2. Gastric tube which is likely coiled in the hypopharynx. Tube tracks inferiorly otherwise as expected passing below the LEFT hemidiaphragm. 3. Swan-Ganz catheter in the main pulmonary artery. 4. Patchy areas of airspace disease favored to represent atelectasis. Attention on follow-up. These results will be called to the ordering clinician or representative by the Radiologist Assistant, and communication documented in the PACS or [employer]. Electronically Signed   By: Zetta Bills M.D.   On: 08/22/2020 14:49   DG Chest Port 1 View  Result Date: 08/20/2020 CLINICAL DATA:  Shortness of breath EXAM: PORTABLE CHEST 1 VIEW COMPARISON:  Chest radiograph and chest CT August 18, 2020 FINDINGS: Areas of patchy ill-defined airspace opacity noted recently have partially cleared. Ill-defined opacity remains in the right upper lobe as well as in portions of the left mid lung and left base and right mid lung regions. No new opacity evident. Heart is upper  normal in size with pulmonary vascularity normal. No adenopathy. No bone lesions. IMPRESSION: There is been partial clearing of ill-defined opacity bilaterally. Patchy areas of airspace opacity remain. This appearance raises question of atypical organism pneumonia. Advise check of COVID-19 status in this regard. No new opacity evident. Stable cardiac silhouette. Electronically Signed   By: Lowella Grip III M.D.   On: 08/20/2020 07:55   DG Chest Port 1 View  Result Date: 08/18/2020 CLINICAL DATA:  Shortness of breath. EXAM: PORTABLE CHEST 1 VIEW COMPARISON:  March 15, 2013 FINDINGS: Mild to moderate  severity diffusely increased interstitial lung markings are seen. There is no evidence of a pleural effusion or pneumothorax. The heart size and mediastinal contours are within normal limits. The visualized skeletal structures are unremarkable. IMPRESSION: Mild to moderate severity diffuse interstitial lung disease with a suspected superimposed component of interstitial edema. Electronically Signed   By: Virgina Norfolk M.D.   On: 08/18/2020 01:51   ECHOCARDIOGRAM COMPLETE  Result Date: 08/18/2020    ECHOCARDIOGRAM REPORT   Patient Name:   SHANTEL WESELY Date of Exam: 08/18/2020 Medical Rec #:  [id number]         Height:       69.0 in Accession #:    [id number]        Weight:       233.2 lb Date of Birth:  [birth year]        BSA:          2.206 m Patient Age:    79 years          BP:           119/87 mmHg Patient Gender: M                 HR:           88 bpm. Exam Location:  Inpatient Procedure: 2D Echo, 3D Echo, Cardiac Doppler, Color Doppler and Strain Analysis Indications:    121-121.4 ST elevation (STEMI) and non-ST elevation (NSTEMI)                 myocardial infarction  History:        Patient has no prior history of Echocardiogram examinations.                 Acute MI, Signs/Symptoms:Chest Pain and Dyspnea; Risk                 Factors:Hypertension, Diabetes, Dyslipidemia and Sleep Apnea.  Sonographer:    Roseanna Rainbow RDCS Referring Phys: [id number] SCOTT W ROSE  Sonographer Comments: Technically difficult study due to poor echo windows. Global longitudinal strain was attempted. IMPRESSIONS  1. Left ventricular ejection fraction, by estimation, is 45 to 50%. The left ventricle has mildly decreased function. The basal-to-mid lateral wall appears moderately hypokinetic based on limited views. The rest of the LV segments appear mildly hypokinetic.There is mild concentric left ventricular hypertrophy. Left ventricular diastolic parameters are consistent with Grade II diastolic dysfunction  (pseudonormalization).  2. Right ventricular systolic function is normal. The right ventricular size is normal.  3. The mitral valve is degenerative. Mild to moderate mitral valve regurgitation. Moderate mitral annular calcification.  4. The aortic valve is tricuspid. There is moderate calcification of the aortic valve. There is moderate thickening of the aortic valve. Aortic valve regurgitation is not visualized. There is moderate aortic stenosis with AVA 1.0cm2, mean gradient 67mHg, peak velocity 2.759m, DOI 0.34. LVOT VTI 18.  5. The inferior vena cava is  normal in size with greater than 50% respiratory variability, suggesting right atrial pressure of 3 mmHg. FINDINGS  Left Ventricle: Left ventricular ejection fraction, by estimation, is 45 to 50%. The left ventricle has mildly decreased function. The basal-to-mid lateral LV sgements appear moderately hypokinetic based on limited views. The rest of the LV segments are  mildly hypokinetic. The left ventricular internal cavity size was normal in size. There is mild concentric left ventricular hypertrophy. Left ventricular diastolic parameters are consistent with Grade II diastolic dysfunction (pseudonormalization). Right Ventricle: The right ventricular size is normal. No increase in right ventricular wall thickness. Right ventricular systolic function is normal. Left Atrium: Left atrial size was normal in size. Right Atrium: Right atrial size was normal in size. Pericardium: There is no evidence of pericardial effusion. Mitral Valve: The mitral valve is degenerative in appearance. There is moderate thickening of the mitral valve leaflet(s). There is moderate calcification of the mitral valve leaflet(s). Moderate mitral annular calcification. Mild to moderate mitral valve regurgitation. Tricuspid Valve: The tricuspid valve is normal in structure. Tricuspid valve regurgitation is trivial. Aortic Valve: The aortic valve is calcified. There is moderate calcification  of the aortic valve. There is moderate thickening of the aortic valve. Aortic valve regurgitation is not visualized. There is moderate aortic stenosis with AVA 1cm2, mean gradient 98mHg, peak velocity 2.74m, DOI 0.34. LVOT VTI 18. Pulmonic Valve: The pulmonic valve was normal in structure. Pulmonic valve regurgitation is trivial. Aorta: The aortic root and ascending aorta are structurally normal, with no evidence of dilitation. Venous: The inferior vena cava is normal in size with greater than 50% respiratory variability, suggesting right atrial pressure of 3 mmHg. IAS/Shunts: No atrial level shunt detected by color flow Doppler.  LEFT VENTRICLE PLAX 2D LVIDd:         4.80 cm      Diastology LVIDs:         3.80 cm      LV e' medial:    7.62 cm/s LV PW:         1.30 cm      LV E/e' medial:  14.4 LV IVS:        1.30 cm      LV e' lateral:   6.42 cm/s LVOT diam:     1.90 cm      LV E/e' lateral: 17.1 LV SV:         51 LV SV Index:   23 LVOT Area:     2.84 cm                              3D Volume EF: LV Volumes (MOD)            3D EF:        50 % LV vol d, MOD A2C: 88.1 ml  LV EDV:       168 ml LV vol d, MOD A4C: 103.0 ml LV ESV:       84 ml LV vol s, MOD A2C: 46.8 ml  LV SV:        84 ml LV vol s, MOD A4C: 56.1 ml LV SV MOD A2C:     41.3 ml LV SV MOD A4C:     103.0 ml LV SV MOD BP:      49.5 ml RIGHT VENTRICLE             IVC RV S prime:  13.10 cm/s  IVC diam: 1.80 cm TAPSE (M-mode): 2.3 cm LEFT ATRIUM             Index       RIGHT ATRIUM           Index LA diam:        4.20 cm 1.90 cm/m  RA Area:     10.60 cm LA Vol (A2C):   53.1 ml 24.08 ml/m RA Volume:   21.00 ml  9.52 ml/m LA Vol (A4C):   43.3 ml 19.63 ml/m LA Biplane Vol: 51.3 ml 23.26 ml/m  AORTIC VALVE AV Area (Vmax):    1.75 cm AV Area (Vmean):   1.69 cm AV Area (VTI):     0.96 cm AV Vmax:           274.50 cm/s AV Vmean:          191.500 cm/s AV VTI:            0.534 m AV Peak Grad:      30.1 mmHg AV Mean Grad:      17.0 mmHg LVOT Vmax:          169.00 cm/s LVOT Vmean:        114.000 cm/s LVOT VTI:          0.180 m LVOT/AV VTI ratio: 0.34  AORTA Ao Root diam: 3.20 cm Ao Asc diam:  2.80 cm MITRAL VALVE MV Area (PHT): 5.02 cm      SHUNTS MV Decel Time: 151 msec      Systemic VTI:  0.18 m Johnny Peak grad:    114.5 mmHg  Systemic Diam: 1.90 cm Johnny Mean grad:    72.0 mmHg Johnny Vmax:         535.00 cm/s Johnny Vmean:        389.0 cm/s Johnny PISA:         1.01 cm Johnny PISA Eff ROA: 7 mm Johnny PISA Radius:  0.40 cm MV E velocity: 110.00 cm/s MV A velocity: 56.30 cm/s MV E/A ratio:  1.95 Gwyndolyn Kaufman MD Electronically signed by Gwyndolyn Kaufman MD Signature Date/Time: 08/18/2020/12:47:02 PM    Final    ECHO INTRAOPERATIVE TEE  Result Date: 08/22/2020  *INTRAOPERATIVE TRANSESOPHAGEAL REPORT *  Patient Name:   REMY VOILES Date of Exam: 08/22/2020 Medical Rec #:  [id number]         Height:       69.0 in Accession #:    [id number]        Weight:       223.3 lb Date of Birth:  August 22, 1966        BSA:          2.17 m Patient Age:    75 years          BP:           125/80 mmHg Patient Gender: M                 HR:           74 bpm. Exam Location:  Anesthesiology Transesophogeal exam was perform intraoperatively during surgical procedure. Patient was closely monitored under general anesthesia during the entirety of examination. Indications:     Aortic stenosis Sonographer:     Darlina Sicilian RDCS Performing Phys: [id number] Lilia Argue [id number] Diagnosing Phys: Rochele Pages Complications: No known complications during this procedure. POST-OP IMPRESSIONS - Left Ventricle: The left ventricle is unchanged from pre-bypass. - Aorta: The aorta appears  unchanged from pre-bypass. - Left Atrial Appendage: A Atricure 71m clip was placed. - Aortic Valve: The aortic valve appears unchanged from pre-bypass. - Mitral Valve: The mitral valve appears unchanged from pre-bypass. - Tricuspid Valve: The tricuspid valve appears unchanged from pre-bypass. - Interatrial Septum: The interatrial  septum appears unchanged from pre-bypass. - Pericardium: The pericardium appears unchanged from pre-bypass. S/P CABG X 4. Emergence on low dose phenylephrine, dopamine, milrinone gtt which were titrated off appropriately with only phenylephrine at time of ICU arrival. No new or worsening wall motion or valvular abnormalities. Post procedure LV hyperdynamic with preserved global function. Known moderate AS unchanged from pre-procedure. PRE-OP FINDINGS  Left Ventricle: The left ventricle has mildly reduced systolic function, with an ejection fraction of 45-50%. The cavity size was normal. There is mild concentric left ventricular hypertrophy. Left ventrical global hypokinesis without regional wall motion abnormalities. No evidence of left ventricular regional wall motion abnormalities. The average left ventricular global longitudinal strain is -8.2 %. Right Ventricle: The right ventricle has normal systolic function. The cavity was normal. There is no increase in right ventricular wall thickness. Right ventricular systolic pressure is normal. Left Atrium: Left atrial size was normal in size. The left atrial appendage is well visualized and there is no evidence of thrombus present. Left atrial appendage velocity is normal at greater than 40 cm/s. Right Atrium: Right atrial size was normal in size. Interatrial Septum: No atrial level shunt detected by color flow Doppler. The interatrial septum appears to be lipomatous. Pericardium: There is no evidence of pericardial effusion. There is no pleural effusion. Mitral Valve: The mitral valve is degenerative in appearance. Moderate thickening of the mitral valve leaflet. Moderate calcification of the mitral valve leaflet. Mitral valve regurgitation is mild to moderate by color flow Doppler. The Johnny jet is centrally-directed. There is no evidence of mitral valve vegetation. Pulmonary venous flow is normal. There is moderate mitral valve regurgitation by PISA measuring 1.01.  There is No evidence of mitral stenosis. Tricuspid Valve: The tricuspid valve was normal in structure. Tricuspid valve regurgitation was not visualized by color flow Doppler. There is no evidence of tricuspid valve vegetation. Aortic Valve: The aortic valve is tricuspid There is moderate thickening of the aortic valve and There is moderate calcification of the aortic valve Aortic valve regurgitation is trivial by color flow Doppler. 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 There is no evidence of a vegetation on the aortic valve. Pulmonic Valve: The pulmonic valve was normal in structure No evidence of pumonic stenosis. Pulmonic valve regurgitation is not visualized by color flow Doppler. Aorta: The aortic root and ascending aorta are normal in size and structure. Pulmonary Artery: SGordy Councilmancatheter present on the right. The pulmonary artery is of normal size. Venous: The inferior vena cava is normal in size with greater than 50% respiratory variability, suggesting right atrial pressure of 3 mmHg. Shunts: No residual ventricular septal defect is detected by 2D or color Doppler. +--------------+--------++ LEFT VENTRICLE         +--------------+--------++ +----------------------+------++ PLAX 2D                2D Longitudinal Strain       +--------------+--------++ +----------------------+------++ LVIDd:        4.88 cm  2D Strain GLS (A2C):  -8.6 % +--------------+--------++ +----------------------+------++ LVIDs:        3.78 cm  2D Strain GLS (A3C):  -9.0 % +--------------+--------++ +----------------------+------++ LV PW:  0.73 cm  2D Strain GLS (A4C):  -7.0 % +--------------+--------++ +----------------------+------++ LV IVS:       0.95 cm  2D Strain GLS Avg:    -8.2 % +--------------+--------++ +----------------------+------++ LVOT diam:    2.05 cm  +--------------+--------++ LV SV:        51 ml     +--------------+--------++ LV SV Index:  22.43    +--------------+--------++ LVOT Area:    3.30 cm +--------------+--------++                        +--------------+--------++ +------------------+------------++ AORTIC VALVE                   +------------------+------------++ AV Area (Vmax):   0.90 cm     +------------------+------------++ AV Area (Vmean):  0.70 cm     +------------------+------------++ AV Area (VTI):    0.72 cm     +------------------+------------++ AV Vmax:          278.67 cm/s  +------------------+------------++ AV Vmean:         218.667 cm/s +------------------+------------++ AV VTI:           0.625 m      +------------------+------------++ AV Peak Grad:     31.1 mmHg    +------------------+------------++ AV Mean Grad:     20.3 mmHg    +------------------+------------++ LVOT Vmax:        75.90 cm/s   +------------------+------------++ LVOT Vmean:       46.200 cm/s  +------------------+------------++ LVOT VTI:         0.136 m      +------------------+------------++ LVOT/AV VTI ratio:0.22         +------------------+------------++  +--------------+-------++ AORTA                 +--------------+-------++ Ao Sinus diam:2.90 cm +--------------+-------++ Ao Asc diam:  2.46 cm +--------------+-------++ +--------------+----------++ MITRAL VALVE             +--------------+-------+ +--------------+----------++ SHUNTS                MV Area (PHT):2.91 cm   +--------------+-------+ +--------------+----------++ Systemic VTI: 0.14 m  MV Peak grad: 2.6 mmHg   +--------------+-------+ +--------------+----------++ Systemic Diam:2.05 cm MV Mean grad: 1.0 mmHg   +--------------+-------+ +--------------+----------++ MV Vmax:      0.81 m/s   +--------------+----------++ MV Vmean:     55.3 cm/s  +--------------+----------++ MV VTI:       0.18 m     +--------------+----------++ MV PHT:       75.69  msec +--------------+----------++ MV Decel Time:261 msec   +--------------+----------++ +---------------+--------++ Johnny PISA:       1.01 cm +---------------+--------++ Johnny PISA Radius:0.40 cm  +---------------+--------++ +--------------+----------++ MV E velocity:49.00 cm/s +--------------+----------++ MV A velocity:79.40 cm/s +--------------+----------++ MV E/A ratio: 0.62       +--------------+----------++  Rochele Pages Electronically signed by Rochele Pages Signature Date/Time: 08/22/2020/5:19:57 PM    Final    VAS US DOPPLER PRE CABG  Result Date: 08/19/2020 PREOPERATIVE VASCULAR EVALUATION  Indications:      Pre-CABG. Risk Factors:     Hypertension, Diabetes, coronary artery disease. Comparison Study: No prior study Performing Technologist: Sharion Dove RVS  Examination Guidelines: A complete evaluation includes B-mode imaging, spectral Doppler, color Doppler, and power Doppler as needed of all accessible portions of each vessel. Bilateral testing is considered an integral part of a complete examination. Limited examinations for reoccurring indications may be performed as noted.  Right Carotid Findings: +----------+--------+--------+--------+------------+--------+  PSV cm/sEDV cm/sStenosisDescribe    Comments +----------+--------+--------+--------+------------+--------+ CCA Prox  98      23              heterogenous         +----------+--------+--------+--------+------------+--------+ CCA Distal114     36              heterogenous         +----------+--------+--------+--------+------------+--------+ ICA Prox  91      29      Normal  heterogenous         +----------+--------+--------+--------+------------+--------+ ICA Distal80      31                                   +----------+--------+--------+--------+------------+--------+ ECA       131     27                                    +----------+--------+--------+--------+------------+--------+ +----------+--------+-------+--------+------------+           PSV cm/sEDV cmsDescribeArm Pressure +----------+--------+-------+--------+------------+ Subclavian59                                  +----------+--------+-------+--------+------------+ +---------+--------+--+--------+--+ VertebralPSV cm/s45EDV cm/s13 +---------+--------+--+--------+--+ Left Carotid Findings: +----------+--------+--------+--------+----------------------+--------+           PSV cm/sEDV cm/sStenosisDescribe              Comments +----------+--------+--------+--------+----------------------+--------+ CCA Prox  105     30              heterogenous                   +----------+--------+--------+--------+----------------------+--------+ CCA Distal120     42              heterogenous                   +----------+--------+--------+--------+----------------------+--------+ ICA Prox  107     35      1-39%   irregular and calcific         +----------+--------+--------+--------+----------------------+--------+ ICA Distal67      30                                             +----------+--------+--------+--------+----------------------+--------+ ECA       159     24                                             +----------+--------+--------+--------+----------------------+--------+ +----------+--------+--------+--------+------------+ SubclavianPSV cm/sEDV cm/sDescribeArm Pressure +----------+--------+--------+--------+------------+           164                                  +----------+--------+--------+--------+------------+ +---------+--------+--+--------+--+ VertebralPSV cm/s67EDV cm/s23 +---------+--------+--+--------+--+  ABI Findings: +--------+------------------+-----+-----------+--------+ Right   Rt Pressure (mmHg)IndexWaveform   Comment  +--------+------------------+-----+-----------+--------+  [id number]                    triphasic           +--------+------------------+-----+-----------+--------+  PTA     154               1.18 multiphasic         +--------+------------------+-----+-----------+--------+ DP      137               1.05 multiphasic         +--------+------------------+-----+-----------+--------+ +--------+------------------+-----+-----------+-------+ Left    Lt Pressure (mmHg)IndexWaveform   Comment +--------+------------------+-----+-----------+-------+ [id number]                    triphasic          +--------+------------------+-----+-----------+-------+ PTA     152               1.16 multiphasic        +--------+------------------+-----+-----------+-------+ DP      124               0.95 multiphasic        +--------+------------------+-----+-----------+-------+ +-------+---------------+----------------+ ABI/TBIToday's ABI/TBIPrevious ABI/TBI +-------+---------------+----------------+ Right  1.18                            +-------+---------------+----------------+ Left   1.16                            +-------+---------------+----------------+  Right Doppler Findings: +--------+--------+-----+---------+--------+ Site    PressureIndexDoppler  Comments +--------+--------+-----+---------+--------+ [id number]          triphasic         +--------+--------+-----+---------+--------+ Radial               triphasic         +--------+--------+-----+---------+--------+ Ulnar                triphasic         +--------+--------+-----+---------+--------+  Left Doppler Findings: +--------+--------+-----+---------+--------+ Site    PressureIndexDoppler  Comments +--------+--------+-----+---------+--------+ [id number]          triphasic         +--------+--------+-----+---------+--------+ Radial               triphasic         +--------+--------+-----+---------+--------+ Ulnar                 triphasic         +--------+--------+-----+---------+--------+  Summary: Right Carotid: Velocities in the right ICA are consistent with a 1-39% stenosis. Left Carotid: Velocities in the left ICA are consistent with a 1-39% stenosis. Vertebrals:  Bilateral vertebral arteries demonstrate antegrade flow. Subclavians: Normal flow hemodynamics were seen in bilateral subclavian              arteries. Right ABI: Resting right ankle-brachial index is within normal range. No evidence of significant right lower extremity arterial disease. Left ABI: Resting left ankle-brachial index is within normal range. No evidence of significant left lower extremity arterial disease. Right Upper Extremity: Doppler waveforms remain within normal limits with right radial compression. Doppler waveforms decrease <50% with right ulnar compression. Left Upper Extremity: Doppler waveforms remain within normal limits with left radial compression. Doppler waveform obliterate with left ulnar compression.  Electronically signed by Monica Martinez MD on 08/19/2020 at 3:40:21 PM.    Final          Discharge Medications: Allergies as of 08/28/2020   No Known Allergies     Medication List    STOP taking these medications   ALPHA LIPOIC ACID PO   amLODipine  5 MG tablet Commonly known as: NORVASC   ASTAXANTHIN PO   CINNAMON PO   COQ10 PO   EXCEDRIN PO   lisinopril 20 MG tablet Commonly known as: ZESTRIL   sildenafil 100 MG tablet Commonly known as: Viagra     TAKE these medications   acetaminophen 325 MG tablet Commonly known as: TYLENOL Take 2 tablets (650 mg total) by mouth every 6 (six) hours as needed for mild pain.   amiodarone 200 MG tablet Commonly known as: PACERONE Continue Amio 486m (2 tabs)  BID for 7 days then, Amio 208m(1 tab)  BID until we see you in the office.   aspirin 81 MG EC tablet Take 1 tablet (81 mg total) by mouth daily. Swallow whole.   atorvastatin 80 MG tablet Commonly  known as: LIPITOR Take 1 tablet (80 mg total) by mouth daily. Start taking on: August 29, 2020 What changed:   medication strength  how much to take  additional instructions   cephALEXin 500 MG capsule Commonly known as: KEFLEX Take 1 capsule (500 mg total) by mouth every 6 (six) hours for 5 days.   clopidogrel 75 MG tablet Commonly known as: PLAVIX Take 1 tablet (75 mg total) by mouth daily. Start taking on: August 29, 2020   Entresto 24-26 MG Generic drug: sacubitril-valsartan Take 1 tablet by mouth 2 (two) times daily.   glipiZIDE 5 MG tablet Commonly known as: GLUCOTROL TAKE 1 TABLET BY MOUTH 2 (TWO) TIMES DAILY BEFORE A MEAL. What changed:   how much to take  how to take this  when to take this  additional instructions   glucose blood test strip Commonly known as: True Metrix Blood Glucose Test Use as instructed to check blood sugar daily.   isosorbide mononitrate 30 MG 24 hr tablet Commonly known as: IMDUR Take 0.5 tablets (15 mg total) by mouth daily. Start taking on: August 29, 2020   metFORMIN 1000 MG tablet Commonly known as: GLUCOPHAGE Take 1 tablet (1,000 mg total) by mouth 2 (two) times daily with a meal.   metoprolol tartrate 25 MG tablet Commonly known as: LOPRESSOR Take 1.5 tablets (37.5 mg total) by mouth 2 (two) times daily.   traMADol 50 MG tablet Commonly known as: ULTRAM Take 1 tablet (50 mg total) by mouth every 6 (six) hours as needed for moderate pain.   True Metrix Meter w/Device Kit Use to check blood sugar as instructed.   TRUEplus Lancets 28G Misc Use as instructed to check blood sugar daily.   Trulicity 1.5 MGQP/6.1PJopn Generic drug: Dulaglutide Inject 1.5 mg into the skin once a week. What changed: when to take this   vitamin C 1000 MG tablet Take 1,000 mg by mouth daily.   VITAMIN D3 PO Take 1 capsule by mouth daily.      The patient has been discharged on:   1.Beta Blocker:  Yes [  x ]                               No   [   ]                              If No, reason:  2.Ace Inhibitor/ARB: Yes [   ]  No  [ x   ]                                     If No, reason:  3.Statin:   Yes [ x  ]                  No  [   ]                  If No, reason:  4.Ecasa:  Yes  [ x  ]                  No   [   ]                  If No, reason:   Follow Up Appointments:  Follow-up Information    Grace Isaac, MD. Go on 09/27/2020.   Specialty: Cardiothoracic Surgery Why: PA/LAT CXR to be taken (at [facility] which is in the same building as Dr. Everrett Coombe office) on 01/27 at 9:30 am;Appointment time is at 10:00 am Contact information: [place] [address] [phone]        [name], MD. Call.   Specialty: Internal Medicine Why: for a follow up for 3-4 weeks regarding further diabetes management and surveillance of HGA1C 7.4 Contact information: [place] [address] [phone]        Deberah Pelton, NP Follow up.   Specialty: Cardiology Why: [place] location - a follow-up has been made for you on Friday Sep 07, 2020 11:15 AM (Arrive by 11:00 AM). This will take the place of the previously scheduled appointment on 1/20 - moved up since you had surgery this admission. Contact information: [address] [place] [address] [phone]               Signed: Terance Hart ContePA-C 08/28/2020, 10:19 AM

## 2020-08-21 NOTE — Progress Notes (Signed)
ANTICOAGULATION CONSULT NOTE  Pharmacy Consult for Heparin Indication: CAD and atrial fibrillation  No Known Allergies  Patient Measurements: Height: 5\' 9"  (175.3 cm) Weight: 105.8 kg (233 lb 4 oz) IBW/kg (Calculated) : 70.7 Heparin Dosing Weight: 90 kg  Vital Signs: Temp: 98.2 F (36.8 C) (12/21 0739) Temp Source: Oral (12/21 0739) BP: 137/93 (12/21 0739) Pulse Rate: 82 (12/21 0739)  Labs: Recent Labs    08/19/20 0503 08/20/20 0028 08/21/20 0043  HGB 13.8 14.4 15.3  HCT 42.6 43.4 43.8  PLT 238 250 244  HEPARINUNFRC 0.26* 0.32 0.31  CREATININE 0.87 0.94 0.89    Estimated Creatinine Clearance: 113.7 mL/min (by C-G formula based on SCr of 0.89 mg/dL).  Assessment: 54 y.o. male with CAD and Afib awaiting CABG for heparin.   Heparin at goal (0.31), cbc within normal limits, no bleeding noted. OHS planned for tomorrow 12/22.  Goal of Therapy:  Heparin level 0.3-0.7 units/ml Monitor platelets by anticoagulation protocol: Yes   Plan:  Continue Heparin at 1850 units/hr Daily heparin level and CBC  1/23, PharmD, [place], [facility] Clinical Pharmacist [phone] Please check AMION for all [facility] Pharmacy numbers 08/21/2020

## 2020-08-22 ENCOUNTER — Inpatient Hospital Stay (HOSPITAL_COMMUNITY): Payer: Self-pay

## 2020-08-22 ENCOUNTER — Inpatient Hospital Stay (HOSPITAL_COMMUNITY)
Admission: EM | Disposition: A | Discharge status: Home / Self Care | Payer: Self-pay | Source: Home / Self Care | Attending: Cardiothoracic Surgery

## 2020-08-22 ENCOUNTER — Inpatient Hospital Stay (HOSPITAL_COMMUNITY): Payer: Self-pay | Admitting: Certified Registered Nurse Anesthetist

## 2020-08-22 DIAGNOSIS — I25810 Atherosclerosis of coronary artery bypass graft(s) without angina pectoris: Secondary | ICD-10-CM | POA: Diagnosis present

## 2020-08-22 HISTORY — PX: CLIPPING OF ATRIAL APPENDAGE: SHX5773

## 2020-08-22 HISTORY — PX: TEE WITHOUT CARDIOVERSION: SHX5443

## 2020-08-22 HISTORY — PX: RADIAL ARTERY HARVEST: SHX5067

## 2020-08-22 HISTORY — PX: CORONARY ARTERY BYPASS GRAFT: SHX141

## 2020-08-22 HISTORY — PX: ENDOVEIN HARVEST OF GREATER SAPHENOUS VEIN: SHX5059

## 2020-08-22 LAB — POCT I-STAT 7, (LYTES, BLD GAS, ICA,H+H)
Acid-Base Excess: 2 mmol/L (ref 0.0–2.0)
Acid-Base Excess: 2 mmol/L (ref 0.0–2.0)
Acid-Base Excess: 2 mmol/L (ref 0.0–2.0)
Acid-Base Excess: 0 mmol/L (ref 0.0–2.0)
Acid-Base Excess: 1 mmol/L (ref 0.0–2.0)
Acid-Base Excess: 1 mmol/L (ref 0.0–2.0)
Bicarbonate: 27.4 mmol/L (ref 20.0–28.0)
Bicarbonate: 27.7 mmol/L (ref 20.0–28.0)
Bicarbonate: 26.4 mmol/L (ref 20.0–28.0)
Bicarbonate: 26.4 mmol/L (ref 20.0–28.0)
Bicarbonate: 26.6 mmol/L (ref 20.0–28.0)
Bicarbonate: 25.5 mmol/L (ref 20.0–28.0)
Calcium, Ion: 1.22 mmol/L (ref 1.15–1.40)
Calcium, Ion: 1 mmol/L — ABNORMAL LOW (ref 1.15–1.40)
Calcium, Ion: 1.09 mmol/L — ABNORMAL LOW (ref 1.15–1.40)
Calcium, Ion: 1.14 mmol/L — ABNORMAL LOW (ref 1.15–1.40)
Calcium, Ion: 1.24 mmol/L (ref 1.15–1.40)
Calcium, Ion: 1.19 mmol/L (ref 1.15–1.40)
HCT: 41 % (ref 39.0–52.0)
HCT: 30 % — ABNORMAL LOW (ref 39.0–52.0)
HCT: 31 % — ABNORMAL LOW (ref 39.0–52.0)
HCT: 32 % — ABNORMAL LOW (ref 39.0–52.0)
HCT: 30 % — ABNORMAL LOW (ref 39.0–52.0)
HCT: 30 % — ABNORMAL LOW (ref 39.0–52.0)
Hemoglobin: 13.9 g/dL (ref 13.0–17.0)
Hemoglobin: 10.2 g/dL — ABNORMAL LOW (ref 13.0–17.0)
Hemoglobin: 10.5 g/dL — ABNORMAL LOW (ref 13.0–17.0)
Hemoglobin: 10.9 g/dL — ABNORMAL LOW (ref 13.0–17.0)
Hemoglobin: 10.2 g/dL — ABNORMAL LOW (ref 13.0–17.0)
Hemoglobin: 10.2 g/dL — ABNORMAL LOW (ref 13.0–17.0)
O2 Saturation: 99 %
O2 Saturation: 100 %
O2 Saturation: 100 %
O2 Saturation: 96 %
O2 Saturation: 97 %
O2 Saturation: 99 %
Patient temperature: 36.1
Patient temperature: 36.2
Potassium: 3.8 mmol/L (ref 3.5–5.1)
Potassium: 3.9 mmol/L (ref 3.5–5.1)
Potassium: 4.1 mmol/L (ref 3.5–5.1)
Potassium: 3.7 mmol/L (ref 3.5–5.1)
Potassium: 3.7 mmol/L (ref 3.5–5.1)
Potassium: 4.4 mmol/L (ref 3.5–5.1)
Sodium: 135 mmol/L (ref 135–145)
Sodium: 139 mmol/L (ref 135–145)
Sodium: 135 mmol/L (ref 135–145)
Sodium: 136 mmol/L (ref 135–145)
Sodium: 136 mmol/L (ref 135–145)
Sodium: 138 mmol/L (ref 135–145)
TCO2: 29 mmol/L (ref 22–32)
TCO2: 29 mmol/L (ref 22–32)
TCO2: 28 mmol/L (ref 22–32)
TCO2: 28 mmol/L (ref 22–32)
TCO2: 28 mmol/L (ref 22–32)
TCO2: 27 mmol/L (ref 22–32)
pCO2 arterial: 46.2 mmHg (ref 32.0–48.0)
pCO2 arterial: 46.3 mmHg (ref 32.0–48.0)
pCO2 arterial: 42 mmHg (ref 32.0–48.0)
pCO2 arterial: 48.6 mmHg — ABNORMAL HIGH (ref 32.0–48.0)
pCO2 arterial: 43 mmHg (ref 32.0–48.0)
pCO2 arterial: 39.8 mmHg (ref 32.0–48.0)
pH, Arterial: 7.381 (ref 7.350–7.450)
pH, Arterial: 7.385 (ref 7.350–7.450)
pH, Arterial: 7.407 (ref 7.350–7.450)
pH, Arterial: 7.342 — ABNORMAL LOW (ref 7.350–7.450)
pH, Arterial: 7.396 (ref 7.350–7.450)
pH, Arterial: 7.412 (ref 7.350–7.450)
pO2, Arterial: 133 mmHg — ABNORMAL HIGH (ref 83.0–108.0)
pO2, Arterial: 331 mmHg — ABNORMAL HIGH (ref 83.0–108.0)
pO2, Arterial: 231 mmHg — ABNORMAL HIGH (ref 83.0–108.0)
pO2, Arterial: 91 mmHg (ref 83.0–108.0)
pO2, Arterial: 91 mmHg (ref 83.0–108.0)
pO2, Arterial: 120 mmHg — ABNORMAL HIGH (ref 83.0–108.0)

## 2020-08-22 LAB — POCT I-STAT, CHEM 8
BUN: 14 mg/dL (ref 6–20)
BUN: 13 mg/dL (ref 6–20)
BUN: 10 mg/dL (ref 6–20)
BUN: 13 mg/dL (ref 6–20)
BUN: 13 mg/dL (ref 6–20)
BUN: 14 mg/dL (ref 6–20)
BUN: 14 mg/dL (ref 6–20)
Calcium, Ion: 1.22 mmol/L (ref 1.15–1.40)
Calcium, Ion: 1.25 mmol/L (ref 1.15–1.40)
Calcium, Ion: 0.93 mmol/L — ABNORMAL LOW (ref 1.15–1.40)
Calcium, Ion: 1.13 mmol/L — ABNORMAL LOW (ref 1.15–1.40)
Calcium, Ion: 1.1 mmol/L — ABNORMAL LOW (ref 1.15–1.40)
Calcium, Ion: 1.14 mmol/L — ABNORMAL LOW (ref 1.15–1.40)
Calcium, Ion: 1.13 mmol/L — ABNORMAL LOW (ref 1.15–1.40)
Chloride: 97 mmol/L — ABNORMAL LOW (ref 98–111)
Chloride: 97 mmol/L — ABNORMAL LOW (ref 98–111)
Chloride: 95 mmol/L — ABNORMAL LOW (ref 98–111)
Chloride: 98 mmol/L (ref 98–111)
Chloride: 97 mmol/L — ABNORMAL LOW (ref 98–111)
Chloride: 97 mmol/L — ABNORMAL LOW (ref 98–111)
Chloride: 98 mmol/L (ref 98–111)
Creatinine, Ser: 0.7 mg/dL (ref 0.61–1.24)
Creatinine, Ser: 0.8 mg/dL (ref 0.61–1.24)
Creatinine, Ser: 0.5 mg/dL — ABNORMAL LOW (ref 0.61–1.24)
Creatinine, Ser: 0.8 mg/dL (ref 0.61–1.24)
Creatinine, Ser: 0.8 mg/dL (ref 0.61–1.24)
Creatinine, Ser: 1 mg/dL (ref 0.61–1.24)
Creatinine, Ser: 1 mg/dL (ref 0.61–1.24)
Glucose, Bld: 179 mg/dL — ABNORMAL HIGH (ref 70–99)
Glucose, Bld: 139 mg/dL — ABNORMAL HIGH (ref 70–99)
Glucose, Bld: 84 mg/dL (ref 70–99)
Glucose, Bld: 117 mg/dL — ABNORMAL HIGH (ref 70–99)
Glucose, Bld: 147 mg/dL — ABNORMAL HIGH (ref 70–99)
Glucose, Bld: 184 mg/dL — ABNORMAL HIGH (ref 70–99)
Glucose, Bld: 156 mg/dL — ABNORMAL HIGH (ref 70–99)
HCT: 42 % (ref 39.0–52.0)
HCT: 38 % — ABNORMAL LOW (ref 39.0–52.0)
HCT: 29 % — ABNORMAL LOW (ref 39.0–52.0)
HCT: 32 % — ABNORMAL LOW (ref 39.0–52.0)
HCT: 31 % — ABNORMAL LOW (ref 39.0–52.0)
HCT: 31 % — ABNORMAL LOW (ref 39.0–52.0)
HCT: 32 % — ABNORMAL LOW (ref 39.0–52.0)
Hemoglobin: 14.3 g/dL (ref 13.0–17.0)
Hemoglobin: 12.9 g/dL — ABNORMAL LOW (ref 13.0–17.0)
Hemoglobin: 9.9 g/dL — ABNORMAL LOW (ref 13.0–17.0)
Hemoglobin: 10.9 g/dL — ABNORMAL LOW (ref 13.0–17.0)
Hemoglobin: 10.5 g/dL — ABNORMAL LOW (ref 13.0–17.0)
Hemoglobin: 10.5 g/dL — ABNORMAL LOW (ref 13.0–17.0)
Hemoglobin: 10.9 g/dL — ABNORMAL LOW (ref 13.0–17.0)
Potassium: 3.8 mmol/L (ref 3.5–5.1)
Potassium: 3.7 mmol/L (ref 3.5–5.1)
Potassium: 4 mmol/L (ref 3.5–5.1)
Potassium: 3.9 mmol/L (ref 3.5–5.1)
Potassium: 4 mmol/L (ref 3.5–5.1)
Potassium: 4.1 mmol/L (ref 3.5–5.1)
Potassium: 3.7 mmol/L (ref 3.5–5.1)
Sodium: 136 mmol/L (ref 135–145)
Sodium: 135 mmol/L (ref 135–145)
Sodium: 138 mmol/L (ref 135–145)
Sodium: 137 mmol/L (ref 135–145)
Sodium: 134 mmol/L — ABNORMAL LOW (ref 135–145)
Sodium: 134 mmol/L — ABNORMAL LOW (ref 135–145)
Sodium: 135 mmol/L (ref 135–145)
TCO2: 24 mmol/L (ref 22–32)
TCO2: 25 mmol/L (ref 22–32)
TCO2: 25 mmol/L (ref 22–32)
TCO2: 26 mmol/L (ref 22–32)
TCO2: 24 mmol/L (ref 22–32)
TCO2: 25 mmol/L (ref 22–32)
TCO2: 23 mmol/L (ref 22–32)

## 2020-08-22 LAB — ECHO INTRAOPERATIVE TEE
AR max vel: 0.9 cm2
AV Area VTI: 0.72 cm2
AV Area mean vel: 0.7 cm2
AV Mean grad: 20.3 mmHg
AV Peak grad: 31.1 mmHg
Ao pk vel: 2.79 m/s
Area-P 1/2: 2.91 cm2
Height: 69 in
MV Vena cont: 0.5 cm
Radius: 0.4 cm
S' Lateral: 3.78 cm
Weight: 3573.22 oz

## 2020-08-22 LAB — CBC
HCT: 47 % (ref 39.0–52.0)
HCT: 32.3 % — ABNORMAL LOW (ref 39.0–52.0)
HCT: 33.7 % — ABNORMAL LOW (ref 39.0–52.0)
Hemoglobin: 16.1 g/dL (ref 13.0–17.0)
Hemoglobin: 10.9 g/dL — ABNORMAL LOW (ref 13.0–17.0)
Hemoglobin: 11.2 g/dL — ABNORMAL LOW (ref 13.0–17.0)
MCH: 29.2 pg (ref 26.0–34.0)
MCH: 28.8 pg (ref 26.0–34.0)
MCH: 28.4 pg (ref 26.0–34.0)
MCHC: 34.3 g/dL (ref 30.0–36.0)
MCHC: 33.7 g/dL (ref 30.0–36.0)
MCHC: 33.2 g/dL (ref 30.0–36.0)
MCV: 85.1 fL (ref 80.0–100.0)
MCV: 85.4 fL (ref 80.0–100.0)
MCV: 85.3 fL (ref 80.0–100.0)
Platelets: 252 10*3/uL (ref 150–400)
Platelets: 148 10*3/uL — ABNORMAL LOW (ref 150–400)
Platelets: 159 10*3/uL (ref 150–400)
RBC: 5.52 MIL/uL (ref 4.22–5.81)
RBC: 3.78 MIL/uL — ABNORMAL LOW (ref 4.22–5.81)
RBC: 3.95 MIL/uL — ABNORMAL LOW (ref 4.22–5.81)
RDW: 12.8 % (ref 11.5–15.5)
RDW: 12.5 % (ref 11.5–15.5)
RDW: 12.4 % (ref 11.5–15.5)
WBC: 11.1 10*3/uL — ABNORMAL HIGH (ref 4.0–10.5)
WBC: 16.2 10*3/uL — ABNORMAL HIGH (ref 4.0–10.5)
WBC: 15.8 10*3/uL — ABNORMAL HIGH (ref 4.0–10.5)
nRBC: 0 % (ref 0.0–0.2)
nRBC: 0 % (ref 0.0–0.2)
nRBC: 0 % (ref 0.0–0.2)

## 2020-08-22 LAB — GLUCOSE, CAPILLARY
Glucose-Capillary: 101 mg/dL — ABNORMAL HIGH (ref 70–99)
Glucose-Capillary: 105 mg/dL — ABNORMAL HIGH (ref 70–99)
Glucose-Capillary: 106 mg/dL — ABNORMAL HIGH (ref 70–99)
Glucose-Capillary: 107 mg/dL — ABNORMAL HIGH (ref 70–99)
Glucose-Capillary: 108 mg/dL — ABNORMAL HIGH (ref 70–99)
Glucose-Capillary: 135 mg/dL — ABNORMAL HIGH (ref 70–99)
Glucose-Capillary: 136 mg/dL — ABNORMAL HIGH (ref 70–99)
Glucose-Capillary: 142 mg/dL — ABNORMAL HIGH (ref 70–99)
Glucose-Capillary: 153 mg/dL — ABNORMAL HIGH (ref 70–99)
Glucose-Capillary: 155 mg/dL — ABNORMAL HIGH (ref 70–99)
Glucose-Capillary: 186 mg/dL — ABNORMAL HIGH (ref 70–99)

## 2020-08-22 LAB — BASIC METABOLIC PANEL
Anion gap: 12 (ref 5–15)
Anion gap: 10 (ref 5–15)
BUN: 11 mg/dL (ref 6–20)
BUN: 13 mg/dL (ref 6–20)
CO2: 24 mmol/L (ref 22–32)
CO2: 19 mmol/L — ABNORMAL LOW (ref 22–32)
Calcium: 9.4 mg/dL (ref 8.9–10.3)
Calcium: 7.8 mg/dL — ABNORMAL LOW (ref 8.9–10.3)
Chloride: 96 mmol/L — ABNORMAL LOW (ref 98–111)
Chloride: 106 mmol/L (ref 98–111)
Creatinine, Ser: 0.97 mg/dL (ref 0.61–1.24)
Creatinine, Ser: 0.93 mg/dL (ref 0.61–1.24)
GFR, Estimated: >60 mL/min (ref >60)
GFR, Estimated: >60 mL/min (ref >60)
Glucose, Bld: 182 mg/dL — ABNORMAL HIGH (ref 70–99)
Glucose, Bld: 144 mg/dL — ABNORMAL HIGH (ref 70–99)
Potassium: 4 mmol/L (ref 3.5–5.1)
Potassium: 4.7 mmol/L (ref 3.5–5.1)
Sodium: 132 mmol/L — ABNORMAL LOW (ref 135–145)
Sodium: 135 mmol/L (ref 135–145)

## 2020-08-22 LAB — POCT I-STAT EG7
Acid-Base Excess: 5 mmol/L — ABNORMAL HIGH (ref 0.0–2.0)
Bicarbonate: 30.6 mmol/L — ABNORMAL HIGH (ref 20.0–28.0)
Calcium, Ion: 1.08 mmol/L — ABNORMAL LOW (ref 1.15–1.40)
HCT: 30 % — ABNORMAL LOW (ref 39.0–52.0)
Hemoglobin: 10.2 g/dL — ABNORMAL LOW (ref 13.0–17.0)
O2 Saturation: 71 %
Potassium: 3.8 mmol/L (ref 3.5–5.1)
Sodium: 140 mmol/L (ref 135–145)
TCO2: 32 mmol/L (ref 22–32)
pCO2, Ven: 51.5 mmHg (ref 44.0–60.0)
pH, Ven: 7.382 (ref 7.250–7.430)
pO2, Ven: 39 mmHg (ref 32.0–45.0)

## 2020-08-22 LAB — PROTIME-INR
INR: 1.3 — ABNORMAL HIGH (ref 0.8–1.2)
Prothrombin Time: 15.8 seconds — ABNORMAL HIGH (ref 11.4–15.2)

## 2020-08-22 LAB — HEMOGLOBIN AND HEMATOCRIT, BLOOD
HCT: 30.8 % — ABNORMAL LOW (ref 39.0–52.0)
Hemoglobin: 10.4 g/dL — ABNORMAL LOW (ref 13.0–17.0)

## 2020-08-22 LAB — APTT: aPTT: 29 seconds (ref 24–36)

## 2020-08-22 LAB — HEPARIN LEVEL (UNFRACTIONATED): Heparin Unfractionated: 0.47 IU/mL (ref 0.30–0.70)

## 2020-08-22 LAB — PLATELET COUNT: Platelets: 142 10*3/uL — ABNORMAL LOW (ref 150–400)

## 2020-08-22 LAB — MAGNESIUM: Magnesium: 3 mg/dL — ABNORMAL HIGH (ref 1.7–2.4)

## 2020-08-22 SURGERY — CORONARY ARTERY BYPASS GRAFTING (CABG)
Anesthesia: General | Site: Leg Upper | Laterality: Right

## 2020-08-22 MED ORDER — POTASSIUM CHLORIDE 10 MEQ/50ML IV SOLN
10.0000 meq | INTRAVENOUS | Status: AC
  Administered 2020-08-22 (×3): 10 meq via INTRAVENOUS

## 2020-08-22 MED ORDER — SODIUM CHLORIDE 0.9 % IV SOLN: INTRAVENOUS | Status: DC

## 2020-08-22 MED ORDER — SODIUM CHLORIDE 0.9 % IV SOLN
1.5000 g | Freq: Two times a day (BID) | INTRAVENOUS | Status: AC
  Administered 2020-08-22 – 2020-08-24 (×4): 1.5 g via INTRAVENOUS
  Filled 2020-08-22 (×4): qty 1.5

## 2020-08-22 MED ORDER — MIDAZOLAM HCL 2 MG/2ML IJ SOLN: 2.0000 mg | INTRAMUSCULAR | Status: DC | PRN

## 2020-08-22 MED ORDER — SODIUM CHLORIDE (PF) 0.9 % IJ SOLN
INTRAMUSCULAR | Status: AC
  Filled 2020-08-22: qty 10

## 2020-08-22 MED ORDER — MIDAZOLAM HCL 5 MG/5ML IJ SOLN
INTRAMUSCULAR | Status: DC | PRN
  Administered 2020-08-22 (×3): 1 mg via INTRAVENOUS
  Administered 2020-08-22: 2 mg via INTRAVENOUS
  Administered 2020-08-22: 1 mg via INTRAVENOUS
  Administered 2020-08-22: 2 mg via INTRAVENOUS
  Administered 2020-08-22 (×6): 1 mg via INTRAVENOUS

## 2020-08-22 MED ORDER — PROTAMINE SULFATE 10 MG/ML IV SOLN
INTRAVENOUS | Status: AC
  Filled 2020-08-22: qty 5

## 2020-08-22 MED ORDER — CHLORHEXIDINE GLUCONATE 0.12% ORAL RINSE (MEDLINE KIT)
15.0000 mL | Freq: Two times a day (BID) | OROMUCOSAL | Status: DC
  Administered 2020-08-22: 15 mL via OROMUCOSAL

## 2020-08-22 MED ORDER — HEPARIN SODIUM (PORCINE) 1000 UNIT/ML IJ SOLN
INTRAMUSCULAR | Status: AC
  Filled 2020-08-22: qty 1

## 2020-08-22 MED ORDER — INSULIN REGULAR(HUMAN) IN NACL 100-0.9 UT/100ML-% IV SOLN: INTRAVENOUS | Status: DC

## 2020-08-22 MED ORDER — ASPIRIN 81 MG PO CHEW: 324.0000 mg | CHEWABLE_TABLET | Freq: Every day | ORAL | Status: DC

## 2020-08-22 MED ORDER — SODIUM CHLORIDE 0.9% FLUSH
10.0000 mL | INTRAVENOUS | Status: DC | PRN
Start: 2020-08-22 — End: 2020-08-26

## 2020-08-22 MED ORDER — BISACODYL 5 MG PO TBEC
10.0000 mg | DELAYED_RELEASE_TABLET | Freq: Every day | ORAL | Status: DC
  Administered 2020-08-23 – 2020-08-25 (×3): 10 mg via ORAL
  Filled 2020-08-22 (×4): qty 2

## 2020-08-22 MED ORDER — PROPOFOL 10 MG/ML IV BOLUS
INTRAVENOUS | Status: DC | PRN
  Administered 2020-08-22: 100 mg via INTRAVENOUS

## 2020-08-22 MED ORDER — CHLORHEXIDINE GLUCONATE CLOTH 2 % EX PADS
6.0000 | MEDICATED_PAD | Freq: Every day | CUTANEOUS | Status: DC
  Administered 2020-08-22 – 2020-08-25 (×4): 6 via TOPICAL

## 2020-08-22 MED ORDER — OXYCODONE HCL 5 MG PO TABS
5.0000 mg | ORAL_TABLET | ORAL | Status: DC | PRN
  Administered 2020-08-23 (×2): 5 mg via ORAL
  Administered 2020-08-23 (×3): 10 mg via ORAL
  Administered 2020-08-24: 5 mg via ORAL
  Filled 2020-08-22 (×2): qty 2
  Filled 2020-08-22 (×2): qty 1
  Filled 2020-08-22: qty 2
  Filled 2020-08-22: qty 1

## 2020-08-22 MED ORDER — ALBUMIN HUMAN 5 % IV SOLN
250.0000 mL | INTRAVENOUS | Status: AC | PRN
  Administered 2020-08-22: 25 g via INTRAVENOUS
  Filled 2020-08-22: qty 250

## 2020-08-22 MED ORDER — DOPAMINE-DEXTROSE 3.2-5 MG/ML-% IV SOLN
0.0000 ug/kg/min | INTRAVENOUS | Status: DC
  Filled 2020-08-22: qty 250

## 2020-08-22 MED ORDER — LACTATED RINGERS IV SOLN: 500.0000 mL | Freq: Once | INTRAVENOUS | Status: DC | PRN

## 2020-08-22 MED ORDER — PROPOFOL 10 MG/ML IV BOLUS
INTRAVENOUS | Status: AC
  Filled 2020-08-22: qty 20

## 2020-08-22 MED ORDER — ORAL CARE MOUTH RINSE
15.0000 mL | OROMUCOSAL | Status: DC
  Administered 2020-08-22: 15 mL via OROMUCOSAL

## 2020-08-22 MED ORDER — CHLORHEXIDINE GLUCONATE 0.12 % MT SOLN
15.0000 mL | OROMUCOSAL | Status: AC
  Administered 2020-08-22: 15 mL via OROMUCOSAL

## 2020-08-22 MED ORDER — PHENYLEPHRINE HCL-NACL 20-0.9 MG/250ML-% IV SOLN
0.0000 ug/min | INTRAVENOUS | Status: DC
  Administered 2020-08-23: 50 ug/min via INTRAVENOUS
  Filled 2020-08-22: qty 250

## 2020-08-22 MED ORDER — PROTAMINE SULFATE 10 MG/ML IV SOLN
INTRAVENOUS | Status: AC
  Filled 2020-08-22: qty 25

## 2020-08-22 MED ORDER — SODIUM CHLORIDE 0.45 % IV SOLN: INTRAVENOUS | Status: DC | PRN

## 2020-08-22 MED ORDER — PLASMA-LYTE 148 IV SOLN
INTRAVENOUS | Status: DC | PRN
  Administered 2020-08-22: 500 mL via INTRAVASCULAR

## 2020-08-22 MED ORDER — HEMOSTATIC AGENTS (NO CHARGE) OPTIME
TOPICAL | Status: DC | PRN
  Administered 2020-08-22 (×2): 1 via TOPICAL

## 2020-08-22 MED ORDER — METOPROLOL TARTRATE 12.5 MG HALF TABLET
12.5000 mg | ORAL_TABLET | Freq: Two times a day (BID) | ORAL | Status: DC
  Administered 2020-08-23 – 2020-08-26 (×7): 12.5 mg via ORAL
  Filled 2020-08-22 (×7): qty 1

## 2020-08-22 MED ORDER — LACTATED RINGERS IV SOLN: INTRAVENOUS | Status: DC | PRN

## 2020-08-22 MED ORDER — MIDAZOLAM HCL 2 MG/2ML IJ SOLN
INTRAMUSCULAR | Status: AC
  Filled 2020-08-22: qty 2

## 2020-08-22 MED ORDER — CHLORHEXIDINE GLUCONATE CLOTH 2 % EX PADS
6.0000 | MEDICATED_PAD | Freq: Every day | CUTANEOUS | Status: DC
  Administered 2020-08-22: 6 via TOPICAL

## 2020-08-22 MED ORDER — SODIUM CHLORIDE 0.9% FLUSH
10.0000 mL | Freq: Two times a day (BID) | INTRAVENOUS | Status: DC
  Administered 2020-08-22 – 2020-08-25 (×5): 10 mL

## 2020-08-22 MED ORDER — ROCURONIUM BROMIDE 10 MG/ML (PF) SYRINGE
PREFILLED_SYRINGE | INTRAVENOUS | Status: AC
  Filled 2020-08-22: qty 10

## 2020-08-22 MED ORDER — PHENYLEPHRINE 40 MCG/ML (10ML) SYRINGE FOR IV PUSH (FOR BLOOD PRESSURE SUPPORT)
PREFILLED_SYRINGE | INTRAVENOUS | Status: DC | PRN
  Administered 2020-08-22 (×6): 40 ug via INTRAVENOUS

## 2020-08-22 MED ORDER — DOPAMINE-DEXTROSE 3.2-5 MG/ML-% IV SOLN
INTRAVENOUS | Status: DC | PRN
  Administered 2020-08-22: 3 ug/kg/min via INTRAVENOUS

## 2020-08-22 MED ORDER — DEXMEDETOMIDINE HCL IN NACL 400 MCG/100ML IV SOLN
0.4000 ug/kg/h | INTRAVENOUS | Status: DC
  Filled 2020-08-22: qty 100

## 2020-08-22 MED ORDER — ACETAMINOPHEN 160 MG/5ML PO SOLN: 650.0000 mg | Freq: Once | ORAL | Status: AC

## 2020-08-22 MED ORDER — SODIUM CHLORIDE 0.9% FLUSH
3.0000 mL | Freq: Two times a day (BID) | INTRAVENOUS | Status: DC
  Administered 2020-08-23 – 2020-08-25 (×5): 3 mL via INTRAVENOUS

## 2020-08-22 MED ORDER — VANCOMYCIN HCL IN DEXTROSE 1-5 GM/200ML-% IV SOLN
1000.0000 mg | Freq: Once | INTRAVENOUS | Status: AC
  Administered 2020-08-22: 1000 mg via INTRAVENOUS
  Filled 2020-08-22: qty 200

## 2020-08-22 MED ORDER — ASPIRIN EC 325 MG PO TBEC
325.0000 mg | DELAYED_RELEASE_TABLET | Freq: Every day | ORAL | Status: DC
  Administered 2020-08-23: 325 mg via ORAL
  Filled 2020-08-22: qty 1

## 2020-08-22 MED ORDER — DOPAMINE-DEXTROSE 3.2-5 MG/ML-% IV SOLN: 0.0000 ug/kg/min | INTRAVENOUS | Status: DC

## 2020-08-22 MED ORDER — LACTATED RINGERS IV SOLN
INTRAVENOUS | Status: DC
  Administered 2020-08-23: 20 mL/h via INTRAVENOUS

## 2020-08-22 MED ORDER — DOCUSATE SODIUM 100 MG PO CAPS
200.0000 mg | ORAL_CAPSULE | Freq: Every day | ORAL | Status: DC
  Administered 2020-08-23 – 2020-08-25 (×3): 200 mg via ORAL
  Filled 2020-08-22 (×4): qty 2

## 2020-08-22 MED ORDER — SODIUM CHLORIDE (PF) 0.9 % IJ SOLN: OROMUCOSAL | Status: DC | PRN

## 2020-08-22 MED ORDER — DEXTROSE 50 % IV SOLN: 0.0000 mL | INTRAVENOUS | Status: DC | PRN

## 2020-08-22 MED ORDER — METOPROLOL TARTRATE 5 MG/5ML IV SOLN
2.5000 mg | INTRAVENOUS | Status: DC | PRN
  Administered 2020-08-23 – 2020-08-26 (×2): 5 mg via INTRAVENOUS
  Filled 2020-08-22 (×2): qty 5

## 2020-08-22 MED ORDER — FAMOTIDINE IN NACL 20-0.9 MG/50ML-% IV SOLN
20.0000 mg | Freq: Two times a day (BID) | INTRAVENOUS | Status: AC
  Administered 2020-08-22: 20 mg via INTRAVENOUS
  Filled 2020-08-22 (×2): qty 50

## 2020-08-22 MED ORDER — ONDANSETRON HCL 4 MG/2ML IJ SOLN: 4.0000 mg | Freq: Four times a day (QID) | INTRAMUSCULAR | Status: DC | PRN

## 2020-08-22 MED ORDER — PANTOPRAZOLE SODIUM 40 MG PO TBEC
40.0000 mg | DELAYED_RELEASE_TABLET | Freq: Every day | ORAL | Status: DC
  Administered 2020-08-24 – 2020-08-26 (×3): 40 mg via ORAL
  Filled 2020-08-22 (×3): qty 1

## 2020-08-22 MED ORDER — METOPROLOL TARTRATE 25 MG/10 ML ORAL SUSPENSION
12.5000 mg | Freq: Two times a day (BID) | ORAL | Status: DC
  Administered 2020-08-22: 12.5 mg
  Filled 2020-08-22: qty 5

## 2020-08-22 MED ORDER — SODIUM CHLORIDE 0.9% FLUSH: 3.0000 mL | INTRAVENOUS | Status: DC | PRN

## 2020-08-22 MED ORDER — LACTATED RINGERS IV SOLN: INTRAVENOUS | Status: DC

## 2020-08-22 MED ORDER — MAGNESIUM SULFATE 4 GM/100ML IV SOLN
4.0000 g | Freq: Once | INTRAVENOUS | Status: AC
  Administered 2020-08-22: 4 g via INTRAVENOUS

## 2020-08-22 MED ORDER — FENTANYL CITRATE (PF) 250 MCG/5ML IJ SOLN
INTRAMUSCULAR | Status: AC
  Filled 2020-08-22: qty 25

## 2020-08-22 MED ORDER — SODIUM CHLORIDE 0.9 % IV SOLN
INTRAVENOUS | Status: DC | PRN
Start: 2020-08-22 — End: 2020-08-22

## 2020-08-22 MED ORDER — TRAMADOL HCL 50 MG PO TABS
50.0000 mg | ORAL_TABLET | ORAL | Status: DC | PRN
  Administered 2020-08-22 – 2020-08-23 (×2): 50 mg via ORAL
  Filled 2020-08-22 (×2): qty 1

## 2020-08-22 MED ORDER — ACETAMINOPHEN 160 MG/5ML PO SOLN: 1000.0000 mg | Freq: Four times a day (QID) | ORAL | Status: DC

## 2020-08-22 MED ORDER — ROCURONIUM BROMIDE 10 MG/ML (PF) SYRINGE
PREFILLED_SYRINGE | INTRAVENOUS | Status: DC | PRN
  Administered 2020-08-22: 20 mg via INTRAVENOUS
  Administered 2020-08-22: 30 mg via INTRAVENOUS
  Administered 2020-08-22: 20 mg via INTRAVENOUS
  Administered 2020-08-22: 50 mg via INTRAVENOUS
  Administered 2020-08-22: 40 mg via INTRAVENOUS
  Administered 2020-08-22: 60 mg via INTRAVENOUS
  Administered 2020-08-22: 50 mg via INTRAVENOUS

## 2020-08-22 MED ORDER — ACETAMINOPHEN 500 MG PO TABS
1000.0000 mg | ORAL_TABLET | Freq: Four times a day (QID) | ORAL | Status: DC
  Administered 2020-08-22 – 2020-08-26 (×15): 1000 mg via ORAL
  Filled 2020-08-22 (×14): qty 2

## 2020-08-22 MED ORDER — SODIUM CHLORIDE 0.9 % IV SOLN: 250.0000 mL | INTRAVENOUS | Status: DC

## 2020-08-22 MED ORDER — MILRINONE LACTATE IN DEXTROSE 20-5 MG/100ML-% IV SOLN
INTRAVENOUS | Status: DC | PRN
  Administered 2020-08-22: 5065 ug via INTRAVENOUS

## 2020-08-22 MED ORDER — ACETAMINOPHEN 650 MG RE SUPP
650.0000 mg | Freq: Once | RECTAL | Status: AC
  Administered 2020-08-22: 650 mg via RECTAL

## 2020-08-22 MED ORDER — MIDAZOLAM HCL (PF) 10 MG/2ML IJ SOLN
INTRAMUSCULAR | Status: AC
  Filled 2020-08-22: qty 2

## 2020-08-22 MED ORDER — MILRINONE LACTATE IN DEXTROSE 20-5 MG/100ML-% IV SOLN
0.1250 ug/kg/min | INTRAVENOUS | Status: DC
  Administered 2020-08-22 – 2020-08-23 (×3): 0.25 ug/kg/min via INTRAVENOUS
  Administered 2020-08-24 – 2020-08-25 (×2): 0.125 ug/kg/min via INTRAVENOUS
  Filled 2020-08-22 (×5): qty 100

## 2020-08-22 MED ORDER — BISACODYL 10 MG RE SUPP: 10.0000 mg | Freq: Every day | RECTAL | Status: DC

## 2020-08-22 MED ORDER — NITROGLYCERIN IN D5W 200-5 MCG/ML-% IV SOLN: 7.0000 ug/min | INTRAVENOUS | Status: DC

## 2020-08-22 MED ORDER — PROTAMINE SULFATE 10 MG/ML IV SOLN
INTRAVENOUS | Status: DC | PRN
  Administered 2020-08-22: 25 mg via INTRAVENOUS
  Administered 2020-08-22: 325 mg via INTRAVENOUS

## 2020-08-22 MED ORDER — 0.9 % SODIUM CHLORIDE (POUR BTL) OPTIME
TOPICAL | Status: DC | PRN
  Administered 2020-08-22: 2000 mL
  Administered 2020-08-22: 3000 mL

## 2020-08-22 MED ORDER — DEXMEDETOMIDINE HCL IN NACL 400 MCG/100ML IV SOLN: 0.0000 ug/kg/h | INTRAVENOUS | Status: DC

## 2020-08-22 MED ORDER — MORPHINE SULFATE (PF) 2 MG/ML IV SOLN
1.0000 mg | INTRAVENOUS | Status: DC | PRN
  Administered 2020-08-22: 4 mg via INTRAVENOUS
  Administered 2020-08-22 – 2020-08-23 (×2): 2 mg via INTRAVENOUS
  Filled 2020-08-22 (×2): qty 1
  Filled 2020-08-22 (×2): qty 2

## 2020-08-22 MED ORDER — ARTIFICIAL TEARS OPHTHALMIC OINT
TOPICAL_OINTMENT | OPHTHALMIC | Status: AC
  Filled 2020-08-22: qty 3.5

## 2020-08-22 MED ORDER — ARTIFICIAL TEARS OPHTHALMIC OINT
TOPICAL_OINTMENT | OPHTHALMIC | Status: DC | PRN
  Administered 2020-08-22: 1 via OPHTHALMIC

## 2020-08-22 MED ORDER — HEPARIN SODIUM (PORCINE) 1000 UNIT/ML IJ SOLN
INTRAMUSCULAR | Status: DC | PRN
  Administered 2020-08-22: 36000 [IU] via INTRAVENOUS

## 2020-08-22 MED ORDER — FENTANYL CITRATE (PF) 250 MCG/5ML IJ SOLN
INTRAMUSCULAR | Status: DC | PRN
  Administered 2020-08-22 (×2): 50 ug via INTRAVENOUS
  Administered 2020-08-22 (×2): 150 ug via INTRAVENOUS
  Administered 2020-08-22: 50 ug via INTRAVENOUS
  Administered 2020-08-22: 150 ug via INTRAVENOUS
  Administered 2020-08-22 (×2): 50 ug via INTRAVENOUS
  Administered 2020-08-22: 100 ug via INTRAVENOUS
  Administered 2020-08-22: 50 ug via INTRAVENOUS

## 2020-08-22 MED ORDER — ALBUMIN HUMAN 5 % IV SOLN
INTRAVENOUS | Status: DC | PRN
Start: 2020-08-22 — End: 2020-08-22

## 2020-08-22 SURGICAL SUPPLY — 129 items
ADAPTER CARDIO PERF ANTE/RETRO (ADAPTER) ×5 IMPLANT
ADH SKN CLS APL DERMABOND .7 (GAUZE/BANDAGES/DRESSINGS) ×4
ADPR PRFSN 84XANTGRD RTRGD (ADAPTER) ×4
AGENT HMST KT MTR STRL THRMB (HEMOSTASIS) ×4
APL SWBSTK 6 STRL LF DISP (MISCELLANEOUS)
APPLICATOR COTTON TIP 6 STRL (MISCELLANEOUS) IMPLANT
APPLICATOR COTTON TIP 6IN STRL (MISCELLANEOUS)
APPLIER CLIP 9.375 SM OPEN (CLIP)
APR CLP SM 9.3 20 MLT OPN (CLIP)
ATRICLIP EXCLUSION VLAA SYSTEM (Miscellaneous) ×5 IMPLANT
BAG DECANTER FOR FLEXI CONT (MISCELLANEOUS) ×5 IMPLANT
BLADE CLIPPER SURG (BLADE) ×5 IMPLANT
BLADE STERNUM SYSTEM 6 (BLADE) ×5 IMPLANT
BLADE SURG 11 STRL SS (BLADE) ×2 IMPLANT
BLADE SURG 15 STRL LF DISP TIS (BLADE) ×5 IMPLANT
BLADE SURG 15 STRL SS (BLADE) ×10
BNDG ELASTIC 4X5.8 VLCR STR LF (GAUZE/BANDAGES/DRESSINGS) ×7 IMPLANT
BNDG ELASTIC 6X5.8 VLCR STR LF (GAUZE/BANDAGES/DRESSINGS) ×5 IMPLANT
BNDG GAUZE ELAST 4 BULKY (GAUZE/BANDAGES/DRESSINGS) ×7 IMPLANT
BOOT SUTURE AID YELLOW STND (SUTURE) IMPLANT
CANISTER SUCT 3000ML PPV (MISCELLANEOUS) ×3 IMPLANT
CANNULA AORTIC ROOT 9FR (CANNULA) ×2 IMPLANT
CANNULA GUNDRY RCSP 15FR (MISCELLANEOUS) ×5 IMPLANT
CATH CPB KIT GERHARDT (MISCELLANEOUS) ×5 IMPLANT
CATH HEART VENT LEFT (CATHETERS) ×4 IMPLANT
CATH THORACIC 28FR (CATHETERS) ×5 IMPLANT
CATH/SQUID NICHOLS JEHLE COR (CATHETERS) IMPLANT
CLIP APPLIE 9.375 SM OPEN (CLIP) IMPLANT
CLIP VESOCCLUDE MED 24/CT (CLIP) IMPLANT
CLIP VESOCCLUDE SM WIDE 24/CT (CLIP) ×2 IMPLANT
COVER MAYO STAND STRL (DRAPES) ×2 IMPLANT
CUFF TOURN SGL QUICK 18X4 (TOURNIQUET CUFF) IMPLANT
CUFF TOURN SGL QUICK 24 (TOURNIQUET CUFF)
CUFF TRNQT CYL 24X4X16.5-23 (TOURNIQUET CUFF) IMPLANT
DERMABOND ADVANCED (GAUZE/BANDAGES/DRESSINGS) ×1
DERMABOND ADVANCED .7 DNX12 (GAUZE/BANDAGES/DRESSINGS) ×1 IMPLANT
DRAIN CHANNEL 28F RND 3/8 FF (WOUND CARE) ×5 IMPLANT
DRAPE CARDIOVASCULAR INCISE (DRAPES) ×5
DRAPE EXTREMITY T 121X128X90 (DISPOSABLE) ×5 IMPLANT
DRAPE HALF SHEET 40X57 (DRAPES) ×2 IMPLANT
DRAPE SLUSH MACHINE 52X66 (DRAPES) ×2 IMPLANT
DRAPE SLUSH/WARMER DISC (DRAPES) ×5 IMPLANT
DRAPE SRG 135X102X78XABS (DRAPES) ×4 IMPLANT
DRSG AQUACEL AG ADV 3.5X14 (GAUZE/BANDAGES/DRESSINGS) ×5 IMPLANT
ELECT BLADE 4.0 EZ CLEAN MEGAD (MISCELLANEOUS) ×5
ELECT CAUTERY BLADE 6.4 (BLADE) ×5 IMPLANT
ELECT REM PT RETURN 9FT ADLT (ELECTROSURGICAL) ×10
ELECTRODE BLDE 4.0 EZ CLN MEGD (MISCELLANEOUS) ×4 IMPLANT
ELECTRODE REM PT RTRN 9FT ADLT (ELECTROSURGICAL) ×8 IMPLANT
FELT TEFLON 1X6 (MISCELLANEOUS) ×10 IMPLANT
FILTER SMOKE EVAC ULPA (FILTER) ×5 IMPLANT
GAUZE SPONGE 4X4 12PLY STRL (GAUZE/BANDAGES/DRESSINGS) ×12 IMPLANT
GAUZE SPONGE 4X4 12PLY STRL LF (GAUZE/BANDAGES/DRESSINGS) ×6 IMPLANT
GEL ULTRASOUND 20GR AQUASONIC (MISCELLANEOUS) IMPLANT
GLOVE BIO SURGEON STRL SZ 6.5 (GLOVE) ×27 IMPLANT
GLOVE BIO SURGEON STRL SZ7 (GLOVE) ×2 IMPLANT
GLOVE BIOGEL PI IND STRL 7.5 (GLOVE) ×3 IMPLANT
GLOVE BIOGEL PI IND STRL 8.5 (GLOVE) ×1 IMPLANT
GLOVE BIOGEL PI IND STRL 9 (GLOVE) ×1 IMPLANT
GLOVE BIOGEL PI INDICATOR 7.5 (GLOVE) ×3
GLOVE BIOGEL PI INDICATOR 8.5 (GLOVE) ×1
GLOVE BIOGEL PI INDICATOR 9 (GLOVE) ×1
GLOVE SURG SS PI 7.0 STRL IVOR (GLOVE) ×2 IMPLANT
GLOVE SURG UNDER POLY LF SZ6.5 (GLOVE) ×6 IMPLANT
GOWN STRL REUS W/ TWL LRG LVL3 (GOWN DISPOSABLE) ×22 IMPLANT
GOWN STRL REUS W/TWL LRG LVL3 (GOWN DISPOSABLE) ×50
HEMOSTAT POWDER SURGIFOAM 1G (HEMOSTASIS) ×15 IMPLANT
HEMOSTAT SURGICEL 2X14 (HEMOSTASIS) ×5 IMPLANT
INSERT FOGARTY XLG (MISCELLANEOUS) IMPLANT
KIT BASIN OR (CUSTOM PROCEDURE TRAY) ×5 IMPLANT
KIT CATH SUCT 8FR (CATHETERS) ×5 IMPLANT
KIT SUCTION CATH 14FR (SUCTIONS) ×10 IMPLANT
KIT TURNOVER KIT B (KITS) ×5 IMPLANT
KIT VASOVIEW HEMOPRO 2 VH 4000 (KITS) ×5 IMPLANT
LEAD PACING MYOCARDI (MISCELLANEOUS) ×5 IMPLANT
LINE VENT (MISCELLANEOUS) ×2 IMPLANT
MARKER GRAFT CORONARY BYPASS (MISCELLANEOUS) ×15 IMPLANT
NS IRRIG 1000ML POUR BTL (IV SOLUTION) ×25 IMPLANT
PACK E OPEN HEART (SUTURE) ×5 IMPLANT
PACK OPEN HEART (CUSTOM PROCEDURE TRAY) ×5 IMPLANT
PAD ARMBOARD 7.5X6 YLW CONV (MISCELLANEOUS) ×10 IMPLANT
PAD ELECT DEFIB RADIOL ZOLL (MISCELLANEOUS) ×5 IMPLANT
PENCIL BUTTON HOLSTER BLD 10FT (ELECTRODE) ×5 IMPLANT
PENCIL SMOKE EVACUATOR (MISCELLANEOUS) ×5 IMPLANT
POSITIONER HEAD DONUT 9IN (MISCELLANEOUS) ×5 IMPLANT
PUNCH AORTIC ROTATE 4.0MM (MISCELLANEOUS) ×2 IMPLANT
SET CARDIOPLEGIA MPS 5001102 (MISCELLANEOUS) ×2 IMPLANT
SHEARS HARMONIC 9CM CVD (BLADE) ×5 IMPLANT
SLEEVE SUCTION 125 (MISCELLANEOUS) ×5 IMPLANT
SOL PREP POV-IOD 4OZ 10% (MISCELLANEOUS) ×2 IMPLANT
SOL PREP PROV IODINE SCRUB 4OZ (MISCELLANEOUS) ×2 IMPLANT
SPONGE LAP 18X18 RF (DISPOSABLE) ×4 IMPLANT
SURGIFLO W/THROMBIN 8M KIT (HEMOSTASIS) ×2 IMPLANT
SUT BONE WAX W31G (SUTURE) ×5 IMPLANT
SUT EB EXC GRN/WHT 2-0 V-5 (SUTURE) ×10 IMPLANT
SUT MNCRL AB 4-0 PS2 18 (SUTURE) ×2 IMPLANT
SUT PROLENE 3 0 RB 1 (SUTURE) ×5 IMPLANT
SUT PROLENE 3 0 SH DA (SUTURE) ×4 IMPLANT
SUT PROLENE 3 0 SH1 36 (SUTURE) ×5 IMPLANT
SUT PROLENE 4 0 RB 1 (SUTURE) ×20
SUT PROLENE 4 0 TF (SUTURE) ×10 IMPLANT
SUT PROLENE 4-0 RB1 .5 CRCL 36 (SUTURE) ×4 IMPLANT
SUT PROLENE 6 0 C 1 24 (SUTURE) ×4 IMPLANT
SUT PROLENE 6 0 CC (SUTURE) ×10 IMPLANT
SUT PROLENE 7 0 BV1 MDA (SUTURE) ×7 IMPLANT
SUT PROLENE 7.0 RB 3 (SUTURE) ×4 IMPLANT
SUT PROLENE 8 0 BV175 6 (SUTURE) ×8 IMPLANT
SUT SILK 2 0 SH CR/8 (SUTURE) ×2 IMPLANT
SUT STEEL 6MS V (SUTURE) ×5 IMPLANT
SUT STEEL SZ 6 DBL 3X14 BALL (SUTURE) ×5 IMPLANT
SUT VIC AB 1 CTX 18 (SUTURE) ×10 IMPLANT
SUT VIC AB 2-0 CT1 27 (SUTURE) ×10
SUT VIC AB 2-0 CT1 TAPERPNT 27 (SUTURE) ×2 IMPLANT
SUT VIC AB 3-0 SH 27 (SUTURE)
SUT VIC AB 3-0 SH 27X BRD (SUTURE) IMPLANT
SUT VIC AB 3-0 X1 27 (SUTURE) ×2 IMPLANT
SYR 50ML SLIP (SYRINGE) IMPLANT
SYSTEM EXCLUSION ATRICLIP VLAA (Miscellaneous) ×1 IMPLANT
SYSTEM SAHARA CHEST DRAIN ATS (WOUND CARE) ×5 IMPLANT
TAPE CLOTH SURG 4X10 WHT LF (GAUZE/BANDAGES/DRESSINGS) ×2 IMPLANT
TAPE PAPER 2X10 WHT MICROPORE (GAUZE/BANDAGES/DRESSINGS) ×2 IMPLANT
TOWEL GREEN STERILE (TOWEL DISPOSABLE) ×5 IMPLANT
TOWEL GREEN STERILE FF (TOWEL DISPOSABLE) ×7 IMPLANT
TRAP FLUID SMOKE EVACUATOR (MISCELLANEOUS) ×5 IMPLANT
TRAY FOLEY SLVR 16FR TEMP STAT (SET/KITS/TRAYS/PACK) ×5 IMPLANT
TUBING LAP HI FLOW INSUFFLATIO (TUBING) ×5 IMPLANT
UNDERPAD 30X36 HEAVY ABSORB (UNDERPADS AND DIAPERS) ×5 IMPLANT
VENT LEFT HEART 12002 (CATHETERS) ×5
WATER STERILE IRR 1000ML POUR (IV SOLUTION) ×10 IMPLANT

## 2020-08-22 NOTE — Anesthesia Procedure Notes (Signed)
Central Venous Catheter Insertion Performed by: Atilano Median, DO, anesthesiologist Start/End12/22/2021 7:06 AM, 08/22/2020 7:07 AM Patient location: Pre-op. Preanesthetic checklist: patient identified, IV checked, site marked, risks and benefits discussed, surgical consent, monitors and equipment checked, pre-op evaluation, timeout performed and anesthesia consent Position: supine Hand hygiene performed  and maximum sterile barriers used  PA cath was placed.Swan type:thermodilution PA Cath depth:40 Procedure performed without using ultrasound guided technique. Attempts: 1 Patient tolerated the procedure well with no immediate complications.

## 2020-08-22 NOTE — Procedures (Signed)
Extubation Procedure Note  Patient Details:   Name: Johnny Nichols DOB: Aug 07, 1966 MRN: [id number]   Airway Documentation:  Airway 8 mm (Active)  Secured at (cm) 23 cm 08/22/20 2205  Measured From Lips 08/22/20 2205  Secured Location Right 08/22/20 2205  Secured By Pink Tape 08/22/20 2205  Prone position No 08/22/20 2205  Cuff Pressure (cm H2O) 28 cm H2O 08/22/20 1931  Site Condition Dry 08/22/20 2205   Vent end date: (not recorded) Vent end time: (not recorded)   Evaluation  O2 sats: stable throughout Complications: No apparent complications Patient did tolerate procedure well. Bilateral Breath Sounds: Clear,Diminished   Yes  Augustina Mood 08/22/2020, 10:34 PM   Pt extubated per rapid wean protocol. Pt able to perform VC of and NIF of -35. Pt had positive cuff leak and is resting comfortably on 4L [place].

## 2020-08-22 NOTE — Anesthesia Procedure Notes (Signed)
Arterial Line Insertion Start/End12/22/2021 7:00 AM Performed by: Audie Pinto, CRNA, CRNA  Patient location: Pre-op. Preanesthetic checklist: patient identified, IV checked, risks and benefits discussed and pre-op evaluation Lidocaine 1% used for infiltration and patient sedated Right, radial was placed Catheter size: 20 G Hand hygiene performed  and maximum sterile barriers used  Allen's test indicative of satisfactory collateral circulation Attempts: 1 Procedure performed without using ultrasound guided technique. Following insertion, dressing applied and Biopatch. Post procedure assessment: normal  Patient tolerated the procedure well with no immediate complications.

## 2020-08-22 NOTE — Progress Notes (Signed)
EVENING ROUNDS NOTE :     [address]       [name] [address]             [phone]                 Day of Surgery Procedure(s) (LRB): CORONARY ARTERY BYPASS GRAFTING (CABG) TIMES FOUR, USING LEFT INTERNAL MAMMARY ARTERY, LEFT RADIAL ARTERY, AND RIGHT LEG GREATER SAPHENOUS VEIN HARVESTED ENDOSCOPICALLY (N/A) CLIPPING OF ATRIAL APPENDAGE USING ATRICURE ATRICLIP (N/A) TRANSESOPHAGEAL ECHOCARDIOGRAM (TEE) (N/A) LEFT RADIAL ARTERY HARVEST (Left) ENDOVEIN HARVEST OF GREATER SAPHENOUS VEIN (Right)   Total Length of Stay:  LOS: 4 days  Events:   Desat, on 100% FiO2 Following commands Minimal CT output Will wean O2 as tolerated before extubation    BP 108/71 (BP Location: Right Arm)   Pulse 85   Temp (!) 96.98 F (36.1 C)   Resp 16   Ht 5\' 9"  (1.753 m)   Wt 101.3 kg   SpO2 97%   BMI 32.98 kg/m   PAP: (15-32)/(0-16) 19/13 CO:  [5.3 L/min] 5.3 L/min CI:  [2.4 L/min/m2] 2.4 L/min/m2  Vent Mode: SIMV;PRVC;PSV FiO2 (%):  [100 %] 100 % Set Rate:  [12 bmp] 12 bmp Vt Set:  [560 mL] 560 mL PEEP:  [8 cmH20] 8 cmH20 Pressure Support:  [10 cmH20] 10 cmH20 Plateau Pressure:  [17 cmH20] 17 cmH20  . sodium chloride Stopped (08/22/20 1543)  . [START ON 08/23/2020] sodium chloride    . sodium chloride    . albumin human 25 g (08/22/20 1425)  . cefUROXime (ZINACEF)  IV    . dexmedetomidine (PRECEDEX) IV infusion 0.1 mcg/kg/hr (08/22/20 1700)  . DOPamine    . famotidine (PEPCID) IV Stopped (08/22/20 1538)  . insulin    . lactated ringers    . lactated ringers    . lactated ringers 20 mL/hr at 08/22/20 1700  . magnesium sulfate 20 mL/hr at 08/22/20 1700  . milrinone 0.25 mcg/kg/min (08/22/20 1700)  . nitroGLYCERIN 10 mcg/min (08/22/20 1700)  . phenylephrine (NEO-SYNEPHRINE) Adult infusion 7 mcg/min (08/22/20 1700)  . potassium chloride 50 mL/hr at 08/22/20 1700  . vancomycin      I/O last 3 completed shifts: In: 2630.4 [P.O.:2020; I.V.:610.4] Out:  4950 [Urine:4950]   CBC Latest Ref Rng & Units 08/22/2020 08/22/2020 08/22/2020  WBC 4.0 - 10.5 K/uL 16.2(H) - -  Hemoglobin 13.0 - 17.0 g/dL 10.9(L) 10.9(L) 10.9(L)  Hematocrit 39.0 - 52.0 % 32.3(L) 32.0(L) 32.0(L)  Platelets 150 - 400 K/uL 148(L) - -    BMP Latest Ref Rng & Units 08/22/2020 08/22/2020 08/22/2020  Glucose 70 - 99 mg/dL 08/24/2020) - 542(H)  BUN 6 - 20 mg/dL 14 - 14  Creatinine 062(B - 1.24 mg/dL 7.62 - 8.31  BUN/Creat Ratio 9 - 20 - - -  Sodium 135 - 145 mmol/L 135 136 134(L)  Potassium 3.5 - 5.1 mmol/L 3.7 3.7 4.0  Chloride 98 - 111 mmol/L 98 - 97(L)  CO2 22 - 32 mmol/L - - -  Calcium 8.9 - 10.3 mg/dL - - -    ABG    Component Value Date/Time   PHART 7.342 (L) 08/22/2020 1259   PCO2ART 48.6 (H) 08/22/2020 1259   PO2ART 91 08/22/2020 1259   HCO3 26.4 08/22/2020 1259   TCO2 23 08/22/2020 1302   ACIDBASEDEF 25.0 (H) 03/15/2013 2110   O2SAT 96.0 08/22/2020 1259       08/24/2020, MD 08/22/2020 5:35 PM

## 2020-08-22 NOTE — Progress Notes (Signed)
      [address]       [place] [address]             [phone]     Pre Procedure note for inpatients:   Johnny Nichols has been scheduled for Procedure(s): CORONARY ARTERY BYPASS GRAFTING (CABG) (N/A) AORTIC VALVE REPLACEMENT (AVR) (N/A) CLIPPING OF ATRIAL APPENDAGE (N/A) TRANSESOPHAGEAL ECHOCARDIOGRAM (TEE) (N/A) POSSIBLE LEFT RADIAL ARTERY HARVEST (N/A) today. The various methods of treatment have been discussed with the patient. After consideration of the risks, benefits and treatment options the patient has consented to the planned procedure.   The patient has been seen and labs reviewed. There are no changes in the patient's condition to prevent proceeding with the planned procedure today.  Recent labs:  Lab Results  Component Value Date   WBC 11.1 (H) 08/22/2020   HGB 16.1 08/22/2020   HCT 47.0 08/22/2020   PLT 252 08/22/2020   GLUCOSE 182 (H) 08/22/2020   CHOL 163 08/18/2020   TRIG 63 08/18/2020   HDL 40 (L) 08/18/2020   LDLCALC 110 (H) 08/18/2020   ALT 29 08/21/2020   AST 24 08/21/2020   NA 132 (L) 08/22/2020   K 4.0 08/22/2020   CL 96 (L) 08/22/2020   CREATININE 0.97 08/22/2020   BUN 11 08/22/2020   CO2 24 08/22/2020   TSH 0.906 08/18/2020   INR 1.0 08/18/2020   HGBA1C 7.4 (H) 08/18/2020   MICROALBUR 1.4 10/11/2015    Delight Ovens, MD 08/22/2020 7:08 AM

## 2020-08-22 NOTE — Anesthesia Procedure Notes (Signed)
Central Venous Catheter Insertion Performed by: Darral Dash, DO, anesthesiologist Start/End12/22/2021 6:55 AM, 08/22/2020 7:05 AM Patient location: Pre-op. Preanesthetic checklist: patient identified, IV checked, site marked, risks and benefits discussed, surgical consent, monitors and equipment checked, pre-op evaluation, timeout performed and anesthesia consent Lidocaine 1% used for infiltration and patient sedated Hand hygiene performed  and maximum sterile barriers used  Catheter size: 9 Fr Central line was placed.MAC introducer Procedure performed using ultrasound guided technique. Ultrasound Notes:anatomy identified, needle tip was noted to be adjacent to the nerve/plexus identified, no ultrasound evidence of intravascular and/or intraneural injection and image(s) printed for medical record Attempts: 1 Following insertion, line sutured, dressing applied and Biopatch. Post procedure assessment: blood return through all ports, free fluid flow and no air  Patient tolerated the procedure well with no immediate complications.

## 2020-08-22 NOTE — Transfer of Care (Signed)
Immediate Anesthesia Transfer of Care Note  Patient: Johnny Nichols  Procedure(s) Performed: CORONARY ARTERY BYPASS GRAFTING (CABG) TIMES FOUR, USING LEFT INTERNAL MAMMARY ARTERY, LEFT RADIAL ARTERY, AND RIGHT LEG GREATER SAPHENOUS VEIN HARVESTED ENDOSCOPICALLY (N/A Chest) CLIPPING OF ATRIAL APPENDAGE USING ATRICURE ATRICLIP (N/A Chest) TRANSESOPHAGEAL ECHOCARDIOGRAM (TEE) (N/A Chest) LEFT RADIAL ARTERY HARVEST (Left Arm Lower) ENDOVEIN HARVEST OF GREATER SAPHENOUS VEIN (Right Leg Upper)  Patient Location: ICU  Anesthesia Type:General  Level of Consciousness: Patient remains intubated per anesthesia plan  Airway & Oxygen Therapy: Patient placed on Ventilator (see vital sign flow sheet for setting)  Post-op Assessment: Report given to RN and Post -op Vital signs reviewed and stable  Post vital signs: Reviewed  Last Vitals:  Vitals Value Taken Time  BP    Temp    Pulse 87 08/22/20 1428  Resp 16 08/22/20 1428  SpO2 96 % 08/22/20 1428  Vitals shown include unvalidated device data.  Last Pain:  Vitals:   08/22/20 0409  TempSrc: Oral  PainSc: 0-No pain         Complications: No complications documented.

## 2020-08-22 NOTE — Brief Op Note (Addendum)
      [address]       [name] [address]             [phone]     08/22/2020  11:52 AM  PATIENT:  Johnny Nichols  54 y.o. male  PRE-OPERATIVE DIAGNOSIS:  1.Coronary Artery Disease 2. Mild/moderate  Aortic Stenosis 3. Atrial fibrillation= single episode  POST-OPERATIVE DIAGNOSIS:1.Coronary Artery Disease 2. Mild/moderate  Aortic Stenosis 3. Atrial fibrillation    PROCEDURE: TRANSESOPHAGEAL ECHOCARDIOGRAM, CORONARY ARTERY BYPASS GRAFTING (CABG) TIMES FOUR, (LIMA to LAD, LEFT RADIAL ARTERY to DIAGONAL, SVG to CIRCUMFLEX, SVG to PDA) USING LEFT INTERNAL MAMMARY ARTERY, LEFT RADIAL ARTERY, AND RIGHT LEG GREATER SAPHENOUS VEIN HARVESTED ENDOSCOPICALLY and CLIPPING OF ATRIAL APPENDAGE USING ATRICURE ATRICLIP   LEFT RADIAL ARTERY HARVEST TIME: 35 minutes LEFT RADIAL ARTERY PREP TIME: 10 minues RIGHT GREATER SAPHENOUS VEIN EVH HARVEST TIME: 37 minutes ; RIGHT GREATER SAPHENOUS VEIN PREP TIME: 15 minutes  SURGEON:  Surgeon(s) and Role:     Delight Ovens, MD - Primary  PHYSICIAN ASSISTANT: [name] PA-C  ASSISTANTS: Norton Pastel RNF  ANESTHESIA:   general  EBL:  Per anesthesia, perfusion record  DRAINS: Chest tubes placed in the mediastinal and pleural spaces   COUNTS CORRECT:  YES  DICTATION: .Dragon Dictation  PLAN OF CARE: Admit to inpatient   PATIENT DISPOSITION:  ICU - intubated and hemodynamically stable.   Delay start of Pharmacological VTE agent (>24hrs) due to surgical blood loss or risk of bleeding: yes  BASELINE WEIGHT: 101.3 kg

## 2020-08-22 NOTE — Progress Notes (Signed)
  Echocardiogram Echocardiogram Transesophageal has been performed.  Johnny Nichols M 08/22/2020, 8:27 AM

## 2020-08-22 NOTE — Anesthesia Procedure Notes (Signed)
Procedure Name: Intubation Date/Time: 08/22/2020 7:49 AM Performed by: Janene Harvey, CRNA Pre-anesthesia Checklist: Patient identified, Emergency Drugs available, Suction available and Patient being monitored Patient Re-evaluated:Patient Re-evaluated prior to induction Oxygen Delivery Method: Circle system utilized Preoxygenation: Pre-oxygenation with 100% oxygen Induction Type: IV induction Ventilation: Mask ventilation without difficulty and Oral airway inserted - appropriate to patient size Laryngoscope Size: Mac and 4 Grade View: Grade III Tube type: Oral Tube size: 8.0 mm Number of attempts: 1 Airway Equipment and Method: Stylet and Oral airway Placement Confirmation: ETT inserted through vocal cords under direct vision,  positive ETCO2 and breath sounds checked- equal and bilateral Secured at: 23 cm Tube secured with: Tape Dental Injury: Teeth and Oropharynx as per pre-operative assessment

## 2020-08-23 ENCOUNTER — Inpatient Hospital Stay (HOSPITAL_COMMUNITY): Payer: Self-pay

## 2020-08-23 ENCOUNTER — Encounter (HOSPITAL_COMMUNITY): Payer: Self-pay | Admitting: Cardiothoracic Surgery

## 2020-08-23 DIAGNOSIS — Z951 Presence of aortocoronary bypass graft: Secondary | ICD-10-CM

## 2020-08-23 LAB — BASIC METABOLIC PANEL
Anion gap: 9 (ref 5–15)
BUN: 10 mg/dL (ref 6–20)
CO2: 19 mmol/L — ABNORMAL LOW (ref 22–32)
Calcium: 7.6 mg/dL — ABNORMAL LOW (ref 8.9–10.3)
Chloride: 105 mmol/L (ref 98–111)
Creatinine, Ser: 0.84 mg/dL (ref 0.61–1.24)
GFR, Estimated: >60 mL/min (ref >60)
Glucose, Bld: 145 mg/dL — ABNORMAL HIGH (ref 70–99)
Potassium: 4.2 mmol/L (ref 3.5–5.1)
Sodium: 133 mmol/L — ABNORMAL LOW (ref 135–145)

## 2020-08-23 LAB — GLUCOSE, CAPILLARY
Glucose-Capillary: 116 mg/dL — ABNORMAL HIGH (ref 70–99)
Glucose-Capillary: 123 mg/dL — ABNORMAL HIGH (ref 70–99)
Glucose-Capillary: 150 mg/dL — ABNORMAL HIGH (ref 70–99)
Glucose-Capillary: 116 mg/dL — ABNORMAL HIGH (ref 70–99)
Glucose-Capillary: 156 mg/dL — ABNORMAL HIGH (ref 70–99)
Glucose-Capillary: 137 mg/dL — ABNORMAL HIGH (ref 70–99)
Glucose-Capillary: 198 mg/dL — ABNORMAL HIGH (ref 70–99)
Glucose-Capillary: 221 mg/dL — ABNORMAL HIGH (ref 70–99)
Glucose-Capillary: 224 mg/dL — ABNORMAL HIGH (ref 70–99)
Glucose-Capillary: 237 mg/dL — ABNORMAL HIGH (ref 70–99)
Glucose-Capillary: 245 mg/dL — ABNORMAL HIGH (ref 70–99)
Glucose-Capillary: 250 mg/dL — ABNORMAL HIGH (ref 70–99)

## 2020-08-23 LAB — POCT I-STAT 7, (LYTES, BLD GAS, ICA,H+H)
Acid-base deficit: 2 mmol/L (ref 0.0–2.0)
Acid-base deficit: 2 mmol/L (ref 0.0–2.0)
Bicarbonate: 21.6 mmol/L (ref 20.0–28.0)
Bicarbonate: 22.5 mmol/L (ref 20.0–28.0)
Calcium, Ion: 1.15 mmol/L (ref 1.15–1.40)
Calcium, Ion: 1.16 mmol/L (ref 1.15–1.40)
HCT: 30 % — ABNORMAL LOW (ref 39.0–52.0)
HCT: 32 % — ABNORMAL LOW (ref 39.0–52.0)
Hemoglobin: 10.2 g/dL — ABNORMAL LOW (ref 13.0–17.0)
Hemoglobin: 10.9 g/dL — ABNORMAL LOW (ref 13.0–17.0)
O2 Saturation: 94 %
O2 Saturation: 95 %
Patient temperature: 37.7
Patient temperature: 37.9
Potassium: 4.2 mmol/L (ref 3.5–5.1)
Potassium: 4.2 mmol/L (ref 3.5–5.1)
Sodium: 137 mmol/L (ref 135–145)
Sodium: 137 mmol/L (ref 135–145)
TCO2: 23 mmol/L (ref 22–32)
TCO2: 24 mmol/L (ref 22–32)
pCO2 arterial: 34.2 mmHg (ref 32.0–48.0)
pCO2 arterial: 36.5 mmHg (ref 32.0–48.0)
pH, Arterial: 7.401 (ref 7.350–7.450)
pH, Arterial: 7.411 (ref 7.350–7.450)
pO2, Arterial: 74 mmHg — ABNORMAL LOW (ref 83.0–108.0)
pO2, Arterial: 79 mmHg — ABNORMAL LOW (ref 83.0–108.0)

## 2020-08-23 LAB — CULTURE, BLOOD (ROUTINE X 2)
Culture: NO GROWTH
Culture: NO GROWTH
Special Requests: ADEQUATE
Special Requests: ADEQUATE

## 2020-08-23 LAB — MAGNESIUM
Magnesium: 2.4 mg/dL (ref 1.7–2.4)
Magnesium: 2.3 mg/dL (ref 1.7–2.4)

## 2020-08-23 LAB — CBC
HCT: 30.5 % — ABNORMAL LOW (ref 39.0–52.0)
HCT: 33.1 % — ABNORMAL LOW (ref 39.0–52.0)
Hemoglobin: 10.6 g/dL — ABNORMAL LOW (ref 13.0–17.0)
Hemoglobin: 11.3 g/dL — ABNORMAL LOW (ref 13.0–17.0)
MCH: 29.8 pg (ref 26.0–34.0)
MCH: 29.3 pg (ref 26.0–34.0)
MCHC: 34.8 g/dL (ref 30.0–36.0)
MCHC: 34.1 g/dL (ref 30.0–36.0)
MCV: 85.7 fL (ref 80.0–100.0)
MCV: 85.8 fL (ref 80.0–100.0)
Platelets: 149 10*3/uL — ABNORMAL LOW (ref 150–400)
Platelets: 176 10*3/uL (ref 150–400)
RBC: 3.56 MIL/uL — ABNORMAL LOW (ref 4.22–5.81)
RBC: 3.86 MIL/uL — ABNORMAL LOW (ref 4.22–5.81)
RDW: 13 % (ref 11.5–15.5)
RDW: 13.2 % (ref 11.5–15.5)
WBC: 13.8 10*3/uL — ABNORMAL HIGH (ref 4.0–10.5)
WBC: 17.1 10*3/uL — ABNORMAL HIGH (ref 4.0–10.5)
nRBC: 0 % (ref 0.0–0.2)
nRBC: 0 % (ref 0.0–0.2)

## 2020-08-23 MED ORDER — FUROSEMIDE 10 MG/ML IJ SOLN
40.0000 mg | Freq: Once | INTRAMUSCULAR | Status: AC
  Administered 2020-08-23: 40 mg via INTRAVENOUS
  Filled 2020-08-23: qty 4

## 2020-08-23 MED ORDER — INSULIN DETEMIR 100 UNIT/ML ~~LOC~~ SOLN: 23.0000 [IU] | Freq: Every day | SUBCUTANEOUS | Status: DC

## 2020-08-23 MED ORDER — INSULIN ASPART 100 UNIT/ML ~~LOC~~ SOLN
0.0000 [IU] | SUBCUTANEOUS | Status: DC
  Administered 2020-08-23: 8 [IU] via SUBCUTANEOUS

## 2020-08-23 MED ORDER — ISOSORBIDE MONONITRATE ER 30 MG PO TB24
15.0000 mg | ORAL_TABLET | Freq: Every day | ORAL | Status: DC
  Administered 2020-08-23 – 2020-08-28 (×6): 15 mg via ORAL
  Filled 2020-08-23 (×6): qty 1

## 2020-08-23 MED ORDER — ENOXAPARIN SODIUM 40 MG/0.4ML ~~LOC~~ SOLN: 40.0000 mg | Freq: Every day | SUBCUTANEOUS | Status: DC

## 2020-08-23 MED ORDER — INSULIN DETEMIR 100 UNIT/ML ~~LOC~~ SOLN
23.0000 [IU] | Freq: Every day | SUBCUTANEOUS | Status: DC
  Administered 2020-08-23: 23 [IU] via SUBCUTANEOUS
  Filled 2020-08-23 (×3): qty 0.23

## 2020-08-23 NOTE — Assessment & Plan Note (Signed)
S/p LA clip (size 40)

## 2020-08-23 NOTE — Progress Notes (Signed)
EVENING ROUNDS NOTE :     [address]       [place] [address]             [phone]                 1 Day Post-Op Procedure(s) (LRB): CORONARY ARTERY BYPASS GRAFTING (CABG) TIMES FOUR, USING LEFT INTERNAL MAMMARY ARTERY, LEFT RADIAL ARTERY, AND RIGHT LEG GREATER SAPHENOUS VEIN HARVESTED ENDOSCOPICALLY (N/A) CLIPPING OF ATRIAL APPENDAGE USING ATRICURE ATRICLIP (N/A) TRANSESOPHAGEAL ECHOCARDIOGRAM (TEE) (N/A) LEFT RADIAL ARTERY HARVEST (Left) ENDOVEIN HARVEST OF GREATER SAPHENOUS VEIN (Right)   Total Length of Stay:  LOS: 5 days  Events:   No events Up to chair Doing well    BP 127/74   Pulse 100   Temp 98.2 F (36.8 C) (Oral)   Resp (!) 24   Ht 5\' 9"  (1.753 m)   Wt 107.3 kg   SpO2 92%   BMI 34.93 kg/m   PAP: (16-41)/(3-25) 19/11 CO:  [4.3 L/min-8.3 L/min] 7.3 L/min CI:  [2 L/min/m2-4 L/min/m2] 3.4 L/min/m2  Vent Mode: PSV;CPAP FiO2 (%):  [40 %-80 %] 40 % Set Rate:  [4 bmp-12 bmp] 4 bmp Vt Set:  [560 mL] 560 mL PEEP:  [5 cmH20-8 cmH20] 5 cmH20 Pressure Support:  [10 cmH20] 10 cmH20 Plateau Pressure:  [18 cmH20-22 cmH20] 20 cmH20  . sodium chloride Stopped (08/22/20 1543)  . sodium chloride    . sodium chloride    . cefUROXime (ZINACEF)  IV Stopped (08/23/20 08/25/20)  . dexmedetomidine (PRECEDEX) IV infusion Stopped (08/22/20 1727)  . lactated ringers    . lactated ringers    . lactated ringers 20 mL/hr at 08/23/20 1500  . milrinone 0.25 mcg/kg/min (08/23/20 1500)  . phenylephrine (NEO-SYNEPHRINE) Adult infusion Stopped (08/23/20 0516)    I/O last 3 completed shifts: In: 6947.8 [P.O.:720; I.V.:4477.5; Blood:300; IV Piggyback:1450.4] Out: 4465 [Urine:3515; Blood:650; Chest Tube:300]   CBC Latest Ref Rng & Units 08/23/2020 08/22/2020 08/22/2020  WBC 4.0 - 10.5 K/uL 13.8(H) - -  Hemoglobin 13.0 - 17.0 g/dL 10.6(L) 10.9(L) 10.2(L)  Hematocrit 39.0 - 52.0 % 30.5(L) 32.0(L) 30.0(L)  Platelets 150 - 400 K/uL 149(L) - -    BMP Latest  Ref Rng & Units 08/23/2020 08/22/2020 08/22/2020  Glucose 70 - 99 mg/dL 08/24/2020) - -  BUN 6 - 20 mg/dL 10 - -  Creatinine 010(X - 1.24 mg/dL 3.23 - -  BUN/Creat Ratio 9 - 20 - - -  Sodium 135 - 145 mmol/L 133(L) 137 137  Potassium 3.5 - 5.1 mmol/L 4.2 4.2 4.2  Chloride 98 - 111 mmol/L 105 - -  CO2 22 - 32 mmol/L 19(L) - -  Calcium 8.9 - 10.3 mg/dL 7.6(L) - -    ABG    Component Value Date/Time   PHART 7.411 08/22/2020 2335   PCO2ART 34.2 08/22/2020 2335   PO2ART 79 (L) 08/22/2020 2335   HCO3 21.6 08/22/2020 2335   TCO2 23 08/22/2020 2335   ACIDBASEDEF 2.0 08/22/2020 2335   O2SAT 95.0 08/22/2020 2335       08/24/2020, MD 08/23/2020 4:28 PM

## 2020-08-23 NOTE — Anesthesia Postprocedure Evaluation (Signed)
Anesthesia Post Note  Patient: Johnny Nichols  Procedure(s) Performed: CORONARY ARTERY BYPASS GRAFTING (CABG) TIMES FOUR, USING LEFT INTERNAL MAMMARY ARTERY, LEFT RADIAL ARTERY, AND RIGHT LEG GREATER SAPHENOUS VEIN HARVESTED ENDOSCOPICALLY (N/A Chest) CLIPPING OF ATRIAL APPENDAGE USING ATRICURE ATRICLIP (N/A Chest) TRANSESOPHAGEAL ECHOCARDIOGRAM (TEE) (N/A Chest) LEFT RADIAL ARTERY HARVEST (Left Arm Lower) ENDOVEIN HARVEST OF GREATER SAPHENOUS VEIN (Right Leg Upper)     Patient location during evaluation: SICU Anesthesia Type: General Level of consciousness: sedated Pain management: pain level controlled Vital Signs Assessment: post-procedure vital signs reviewed and stable Respiratory status: patient remains intubated per anesthesia plan Cardiovascular status: stable Postop Assessment: no apparent nausea or vomiting Anesthetic complications: no   No complications documented.  Last Vitals:  Vitals:   08/23/20 0600 08/23/20 0700  BP: 105/64   Pulse: 90 96  Resp: (!) 25 19  Temp: 37.7 C 37.3 C  SpO2: 93% 94%    Last Pain:  Vitals:   08/23/20 0720  TempSrc:   PainSc: 0-No pain                 Earl Lites P Cheyne [place]

## 2020-08-23 NOTE — Progress Notes (Addendum)
Patient ID: Johnny Nichols, male   DOB: 1966/04/30, 54 y.o.   MRN: [id number]  TCTS DAILY ICU PROGRESS NOTE                   [address]            [place] [address]          [phone]   1 Day Post-Op Procedure(s) (LRB): CORONARY ARTERY BYPASS GRAFTING (CABG) TIMES FOUR, USING LEFT INTERNAL MAMMARY ARTERY, LEFT RADIAL ARTERY, AND RIGHT LEG GREATER SAPHENOUS VEIN HARVESTED ENDOSCOPICALLY (N/A) CLIPPING OF ATRIAL APPENDAGE USING ATRICURE ATRICLIP (N/A) TRANSESOPHAGEAL ECHOCARDIOGRAM (TEE) (N/A) LEFT RADIAL ARTERY HARVEST (Left) ENDOVEIN HARVEST OF GREATER SAPHENOUS VEIN (Right)  Total Length of Stay:  LOS: 5 days   Subjective: Patient awake alert neurologically intact extubated last night  Objective: Vital signs in last 24 hours: Temp:  [96.98 F (36.1 C)-100.4 F (38 C)] 99.14 F (37.3 C) (12/23 0700) Pulse Rate:  [84-104] 96 (12/23 0700) Cardiac Rhythm: Normal sinus rhythm (12/23 0400) Resp:  [14-26] 19 (12/23 0700) BP: (93-124)/(52-71) 105/64 (12/23 0600) SpO2:  [93 %-100 %] 94 % (12/23 0700) Arterial Line BP: (95-140)/(40-70) 114/52 (12/23 0700) FiO2 (%):  [40 %-100 %] 40 % (12/22 2205) Weight:  [107.3 kg] 107.3 kg (12/23 0500)  Filed Weights   08/18/20 0619 08/22/20 0608 08/23/20 0500  Weight: 105.8 kg 101.3 kg 107.3 kg    Weight change: 6 kg   Hemodynamic parameters for last 24 hours: PAP: (16-41)/(3-25) 20/14 CO:  [4.3 L/min-8.3 L/min] 7.3 L/min CI:  [2 L/min/m2-4 L/min/m2] 3.4 L/min/m2  Intake/Output from previous day: 12/22 0701 - 12/23 0700 In: 6243.7 [P.O.:240; I.V.:4253.3; Blood:300; IV Piggyback:1450.4] Out: 3015 [Urine:2065; Blood:650; Chest Tube:300]  Intake/Output this shift: No intake/output data recorded.  Current Meds: Scheduled Meds: . acetaminophen  1,000 mg Oral Q6H   Or  . acetaminophen (TYLENOL) oral liquid 160 mg/5 mL  1,000 mg Per Tube Q6H  . aspirin EC  325 mg Oral Daily   Or  . aspirin  324 mg Per  Tube Daily  . atorvastatin  80 mg Oral Daily  . bisacodyl  10 mg Oral Daily   Or  . bisacodyl  10 mg Rectal Daily  . Chlorhexidine Gluconate Cloth  6 each Topical Daily  . Chlorhexidine Gluconate Cloth  6 each Topical Daily  . docusate sodium  200 mg Oral Daily  . metoprolol tartrate  12.5 mg Oral BID   Or  . metoprolol tartrate  12.5 mg Per Tube BID  . [START ON 08/24/2020] pantoprazole  40 mg Oral Daily  . sodium chloride flush  10-40 mL Intracatheter Q12H  . sodium chloride flush  3 mL Intravenous Q12H   Continuous Infusions: . sodium chloride Stopped (08/22/20 1543)  . sodium chloride    . sodium chloride    . albumin human 25 g (08/22/20 1425)  . cefUROXime (ZINACEF)  IV Stopped (08/23/20 3299)  . dexmedetomidine (PRECEDEX) IV infusion Stopped (08/22/20 1727)  . DOPamine    . famotidine (PEPCID) IV Stopped (08/22/20 1538)  . insulin 0.7 mL/hr at 08/23/20 0700  . lactated ringers    . lactated ringers    . lactated ringers 20 mL/hr at 08/23/20 0700  . milrinone 0.25 mcg/kg/min (08/23/20 0700)  . nitroGLYCERIN 25 mcg/min (08/23/20 0700)  . phenylephrine (NEO-SYNEPHRINE) Adult infusion Stopped (08/23/20 0516)   PRN Meds:.sodium chloride, albumin human, dextrose, lactated ringers, metoprolol tartrate, midazolam, morphine injection, ondansetron (ZOFRAN) IV, oxyCODONE,  sodium chloride flush, sodium chloride flush, traMADol  General appearance: alert and cooperative Neurologic: intact Heart: regular rate and rhythm, S1, S2 normal, no murmur, click, rub or gallop Lungs: diminished breath sounds bibasilar Abdomen: soft, non-tender; bowel sounds normal; no masses,  no organomegaly Extremities: extremities normal, atraumatic, no cyanosis or edema and Homans sign is negative, no sign of DVT Wound: Sternum intact dressing intact  Lab Results: CBC: Recent Labs    08/22/20 2021 08/22/20 2229 08/22/20 2335 08/23/20 0351  WBC 15.8*  --   --  13.8*  HGB 11.2*   < > 10.9* 10.6*   HCT 33.7*   < > 32.0* 30.5*  PLT 159  --   --  149*   < > = values in this interval not displayed.   BMET:  Recent Labs    08/22/20 2021 08/22/20 2229 08/22/20 2335 08/23/20 0351  NA 135   < > 137 133*  K 4.7   < > 4.2 4.2  CL 106  --   --  105  CO2 19*  --   --  19*  GLUCOSE 144*  --   --  145*  BUN 13  --   --  10  CREATININE 0.93  --   --  0.84  CALCIUM 7.8*  --   --  7.6*   < > = values in this interval not displayed.    CMET: Lab Results  Component Value Date   WBC 13.8 (H) 08/23/2020   HGB 10.6 (L) 08/23/2020   HCT 30.5 (L) 08/23/2020   PLT 149 (L) 08/23/2020   GLUCOSE 145 (H) 08/23/2020   CHOL 163 08/18/2020   TRIG 63 08/18/2020   HDL 40 (L) 08/18/2020   LDLCALC 110 (H) 08/18/2020   ALT 29 08/21/2020   AST 24 08/21/2020   NA 133 (L) 08/23/2020   K 4.2 08/23/2020   CL 105 08/23/2020   CREATININE 0.84 08/23/2020   BUN 10 08/23/2020   CO2 19 (L) 08/23/2020   TSH 0.906 08/18/2020   INR 1.3 (H) 08/22/2020   HGBA1C 7.4 (H) 08/18/2020   MICROALBUR 1.4 10/11/2015      PT/INR:  Recent Labs    08/22/20 1441  LABPROT 15.8*  INR 1.3*   Radiology: DG CHEST PORT 1 VIEW  Result Date: 08/22/2020 CLINICAL DATA:  NG tube placement. EXAM: PORTABLE CHEST 1 VIEW COMPARISON:  Similar 20 second, 2021. FINDINGS: Status post CABG and atrial clipping with median sternotomy. Similar positioning of the endotracheal tube, Swan-Ganz catheter, left-sided chest tube, mediastinal drain. A gastric tube courses below the diaphragm with the tip projecting expected region the stomach. Similar low lung volumes with left basilar atelectasis. No visible pleural effusions or pneumothorax. Similar enlarged cardiac silhouette, which is accentuated by AP portable technique. IMPRESSION: 1. Gastric tube courses below the diaphragm with the tip projecting in expected region of the stomach. Additional support devices are stable. 2. Similar low lung volumes with left basilar atelectasis.  Electronically Signed   By: Feliberto Harts MD   On: 08/22/2020 15:25   DG Chest Port 1 View  Result Date: 08/22/2020 CLINICAL DATA:  Pneumothorax, follow-up evaluation EXAM: PORTABLE CHEST 1 VIEW COMPARISON:  December twenty-first of 2021 FINDINGS: RIGHT-sided vascular sheath with Swan-Ganz catheter in place, tip in the main pulmonary artery. Endotracheal tube approximately 3.2 cm above the carina. Chest support tube projecting over the mediastinum following median sternotomy for atrial clipping and CABG. This enters via inferior approach terminating over the mediastinum  near the second sternal wire, second from the top. Gastric tube in place may be coiled within the hypopharynx, tip off the field of the radiograph. LEFT-sided chest tube in place.  No visible pneumothorax. Patchy areas of linear airspace disease and predominantly linear opacities at the lung bases greatest in the RIGHT mid chest. Cardiomediastinal contours mildly enlarged post median sternotomy. The mild fullness of RIGHT and LEFT hilum. On limited assessment no acute skeletal process. IMPRESSION: 1. Interval sternotomy for LEFT atrial clipping and CABG with mediastinal drain and chest tube in place, no visible pneumothorax on supine radiograph. 2. Gastric tube which is likely coiled in the hypopharynx. Tube tracks inferiorly otherwise as expected passing below the LEFT hemidiaphragm. 3. Swan-Ganz catheter in the main pulmonary artery. 4. Patchy areas of airspace disease favored to represent atelectasis. Attention on follow-up. These results will be called to the ordering clinician or representative by the Radiologist Assistant, and communication documented in the PACS or [employer]. Electronically Signed   By: Donzetta Kohut M.D.   On: 08/22/2020 14:49     Assessment/Plan: S/P Procedure(s) (LRB): CORONARY ARTERY BYPASS GRAFTING (CABG) TIMES FOUR, USING LEFT INTERNAL MAMMARY ARTERY, LEFT RADIAL ARTERY, AND RIGHT LEG GREATER  SAPHENOUS VEIN HARVESTED ENDOSCOPICALLY (N/A) CLIPPING OF ATRIAL APPENDAGE USING ATRICURE ATRICLIP (N/A) TRANSESOPHAGEAL ECHOCARDIOGRAM (TEE) (N/A) LEFT RADIAL ARTERY HARVEST (Left) ENDOVEIN HARVEST OF GREATER SAPHENOUS VEIN (Right) Mobilize Diuresis d/c tubes/lines Continue foley due to diuresing patient Expected Acute  Blood - loss Anemia- continue to monitor  Holding sinus rhythm Continue low-dose nitrates for radial harvest Renal function stable Continue diabetes care-adjust insulin drip and sliding scale Expected Acute  Blood - loss Anemia- continue to monitor  With presentation of STIMI will go home on plavix and asa 81 mg    Delight Ovens 08/23/2020 7:39 AM

## 2020-08-24 ENCOUNTER — Inpatient Hospital Stay (HOSPITAL_COMMUNITY): Payer: Self-pay

## 2020-08-24 LAB — COMPREHENSIVE METABOLIC PANEL
ALT: 19 U/L (ref 0–44)
AST: 22 U/L (ref 15–41)
Albumin: 2.9 g/dL — ABNORMAL LOW (ref 3.5–5.0)
Alkaline Phosphatase: 46 U/L (ref 38–126)
Anion gap: 8 (ref 5–15)
BUN: 8 mg/dL (ref 6–20)
CO2: 24 mmol/L (ref 22–32)
Calcium: 8.3 mg/dL — ABNORMAL LOW (ref 8.9–10.3)
Chloride: 98 mmol/L (ref 98–111)
Creatinine, Ser: 0.92 mg/dL (ref 0.61–1.24)
GFR, Estimated: >60 mL/min (ref >60)
Glucose, Bld: 223 mg/dL — ABNORMAL HIGH (ref 70–99)
Potassium: 4.1 mmol/L (ref 3.5–5.1)
Sodium: 130 mmol/L — ABNORMAL LOW (ref 135–145)
Total Bilirubin: 1.2 mg/dL (ref 0.3–1.2)
Total Protein: 5.8 g/dL — ABNORMAL LOW (ref 6.5–8.1)

## 2020-08-24 LAB — CBC
HCT: 33.1 % — ABNORMAL LOW (ref 39.0–52.0)
Hemoglobin: 10.8 g/dL — ABNORMAL LOW (ref 13.0–17.0)
MCH: 28.4 pg (ref 26.0–34.0)
MCHC: 32.6 g/dL (ref 30.0–36.0)
MCV: 87.1 fL (ref 80.0–100.0)
Platelets: 149 10*3/uL — ABNORMAL LOW (ref 150–400)
RBC: 3.8 MIL/uL — ABNORMAL LOW (ref 4.22–5.81)
RDW: 13.2 % (ref 11.5–15.5)
WBC: 17.2 10*3/uL — ABNORMAL HIGH (ref 4.0–10.5)
nRBC: 0 % (ref 0.0–0.2)

## 2020-08-24 LAB — GLUCOSE, CAPILLARY
Glucose-Capillary: 210 mg/dL — ABNORMAL HIGH (ref 70–99)
Glucose-Capillary: 275 mg/dL — ABNORMAL HIGH (ref 70–99)
Glucose-Capillary: 349 mg/dL — ABNORMAL HIGH (ref 70–99)
Glucose-Capillary: 287 mg/dL — ABNORMAL HIGH (ref 70–99)
Glucose-Capillary: 347 mg/dL — ABNORMAL HIGH (ref 70–99)

## 2020-08-24 MED ORDER — CLOPIDOGREL BISULFATE 75 MG PO TABS
75.0000 mg | ORAL_TABLET | Freq: Every day | ORAL | Status: DC
  Administered 2020-08-24 – 2020-08-28 (×5): 75 mg via ORAL
  Filled 2020-08-24 (×5): qty 1

## 2020-08-24 MED ORDER — INSULIN ASPART 100 UNIT/ML ~~LOC~~ SOLN
0.0000 [IU] | Freq: Three times a day (TID) | SUBCUTANEOUS | Status: DC
  Administered 2020-08-24 (×2): 12 [IU] via SUBCUTANEOUS
  Administered 2020-08-24 (×2): 16 [IU] via SUBCUTANEOUS
  Administered 2020-08-25: 8 [IU] via SUBCUTANEOUS
  Administered 2020-08-25 (×2): 16 [IU] via SUBCUTANEOUS
  Administered 2020-08-25: 4 [IU] via SUBCUTANEOUS
  Administered 2020-08-26 (×3): 8 [IU] via SUBCUTANEOUS
  Administered 2020-08-26: 2 [IU] via SUBCUTANEOUS
  Administered 2020-08-27: 4 [IU] via SUBCUTANEOUS
  Administered 2020-08-27: 8 [IU] via SUBCUTANEOUS
  Administered 2020-08-27: 4 [IU] via SUBCUTANEOUS
  Administered 2020-08-28: 2 [IU] via SUBCUTANEOUS

## 2020-08-24 MED ORDER — ASPIRIN 81 MG PO CHEW
81.0000 mg | CHEWABLE_TABLET | Freq: Every day | ORAL | Status: DC
  Filled 2020-08-24: qty 1

## 2020-08-24 MED ORDER — INSULIN DETEMIR 100 UNIT/ML ~~LOC~~ SOLN
30.0000 [IU] | Freq: Two times a day (BID) | SUBCUTANEOUS | Status: DC
  Administered 2020-08-24 – 2020-08-25 (×2): 30 [IU] via SUBCUTANEOUS
  Filled 2020-08-24 (×3): qty 0.3

## 2020-08-24 MED ORDER — INSULIN DETEMIR 100 UNIT/ML ~~LOC~~ SOLN
30.0000 [IU] | Freq: Every day | SUBCUTANEOUS | Status: DC
  Administered 2020-08-24: 30 [IU] via SUBCUTANEOUS
  Filled 2020-08-24: qty 0.3

## 2020-08-24 MED ORDER — FUROSEMIDE 10 MG/ML IJ SOLN
40.0000 mg | Freq: Two times a day (BID) | INTRAMUSCULAR | Status: AC
  Administered 2020-08-24 – 2020-08-25 (×4): 40 mg via INTRAVENOUS
  Filled 2020-08-24 (×4): qty 4

## 2020-08-24 MED ORDER — ASPIRIN EC 81 MG PO TBEC
81.0000 mg | DELAYED_RELEASE_TABLET | Freq: Every day | ORAL | Status: DC
  Administered 2020-08-24 – 2020-08-26 (×3): 81 mg via ORAL
  Filled 2020-08-24 (×3): qty 1

## 2020-08-24 MED ORDER — NITROGLYCERIN 1 MG/10 ML FOR IR/CATH LAB
INTRA_ARTERIAL | Status: AC
  Filled 2020-08-24: qty 10

## 2020-08-24 MED FILL — Magnesium Sulfate Inj 50%: INTRAMUSCULAR | Qty: 10 | Status: AC

## 2020-08-24 MED FILL — Heparin Sodium (Porcine) Inj 1000 Unit/ML: INTRAMUSCULAR | Qty: 30 | Status: AC

## 2020-08-24 MED FILL — Potassium Chloride Inj 2 mEq/ML: INTRAVENOUS | Qty: 40 | Status: AC

## 2020-08-24 NOTE — Progress Notes (Signed)
Patient ID: Johnny Nichols, male   DOB: 12/23/1965, 54 y.o.   MRN: [id number] TCTS   Hemodynamically stable in sinus rhythm 108 on milrinone 0.125.  sats 94% on 4L   Diuresing well.  Glucose has been high 200's to 300's. Will increase Levemir to 30 bid and continue SSI.

## 2020-08-24 NOTE — Progress Notes (Signed)
Cardiology Progress Note  Patient ID: Johnny Nichols MRN: [id number] DOB: 08/05/1966 Date of Encounter: 08/24/2020  Primary Cardiologist: Reatha Harps, MD  Subjective   Chief Complaint: Shortness of breath/chest pain  HPI: Still has chest tube in place.  Reports irritation at bedside.  No shortness of breath.  Chest x-ray with left-sided pleural effusion.  ROS:  All other ROS reviewed and negative. Pertinent positives noted in the HPI.     Inpatient Medications  Scheduled Meds: . acetaminophen  1,000 mg Oral Q6H   Or  . acetaminophen (TYLENOL) oral liquid 160 mg/5 mL  1,000 mg Per Tube Q6H  . aspirin EC  325 mg Oral Daily   Or  . aspirin  324 mg Per Tube Daily  . atorvastatin  80 mg Oral Daily  . bisacodyl  10 mg Oral Daily   Or  . bisacodyl  10 mg Rectal Daily  . Chlorhexidine Gluconate Cloth  6 each Topical Daily  . Chlorhexidine Gluconate Cloth  6 each Topical Daily  . docusate sodium  200 mg Oral Daily  . insulin aspart  0-24 Units Subcutaneous TID AC & HS  . insulin detemir  23 Units Subcutaneous Daily  . isosorbide mononitrate  15 mg Oral Daily  . metoprolol tartrate  12.5 mg Oral BID   Or  . metoprolol tartrate  12.5 mg Per Tube BID  . pantoprazole  40 mg Oral Daily  . sodium chloride flush  10-40 mL Intracatheter Q12H  . sodium chloride flush  3 mL Intravenous Q12H   Continuous Infusions: . sodium chloride Stopped (08/22/20 1543)  . sodium chloride    . sodium chloride    . dexmedetomidine (PRECEDEX) IV infusion Stopped (08/22/20 1727)  . lactated ringers    . lactated ringers    . lactated ringers 20 mL/hr at 08/24/20 0600  . milrinone 0.25 mcg/kg/min (08/24/20 0600)  . phenylephrine (NEO-SYNEPHRINE) Adult infusion Stopped (08/23/20 0516)   PRN Meds: sodium chloride, dextrose, lactated ringers, metoprolol tartrate, midazolam, ondansetron (ZOFRAN) IV, oxyCODONE, sodium chloride flush, sodium chloride flush, traMADol   Vital Signs   Vitals:    08/24/20 0500 08/24/20 0520 08/24/20 0600 08/24/20 0724  BP: 123/71  138/79   Pulse: 95 100 (!) 104   Resp: (!) 23 (!) 31 (!) 30   Temp:    99.7 F (37.6 C)  TempSrc:    Axillary  SpO2: 93% 93% 93%   Weight: 107 kg     Height:        Intake/Output Summary (Last 24 hours) at 08/24/2020 0729 Last data filed at 08/24/2020 0600 Gross per 24 hour  Intake 1059.15 ml  Output 4500 ml  Net -3440.85 ml   Last 3 Weights 08/24/2020 08/23/2020 08/22/2020  Weight (lbs) 235 lb 14.3 oz 236 lb 8.9 oz 223 lb 5.2 oz  Weight (kg) 107 kg 107.3 kg 101.3 kg      Telemetry  Overnight telemetry shows sinus tachycardia with heart rate in the low 100s, which I personally reviewed.   ECG  The most recent ECG shows sinus rhythm, heart rate 94, nonspecific ST-T changes, which I personally reviewed.   Physical Exam   Vitals:   08/24/20 0500 08/24/20 0520 08/24/20 0600 08/24/20 0724  BP: 123/71  138/79   Pulse: 95 100 (!) 104   Resp: (!) 23 (!) 31 (!) 30   Temp:    99.7 F (37.6 C)  TempSrc:    Axillary  SpO2: 93% 93% 93%   Weight:  107 kg     Height:         Intake/Output Summary (Last 24 hours) at 08/24/2020 0729 Last data filed at 08/24/2020 0600 Gross per 24 hour  Intake 1059.15 ml  Output 4500 ml  Net -3440.85 ml    Last 3 Weights 08/24/2020 08/23/2020 08/22/2020  Weight (lbs) 235 lb 14.3 oz 236 lb 8.9 oz 223 lb 5.2 oz  Weight (kg) 107 kg 107.3 kg 101.3 kg    Body mass index is 34.84 kg/m.   General: Well nourished, well developed, in no acute distress, left chest tube in place Head: Atraumatic, normal size  Eyes: PEERLA, EOMI  Neck: Supple, no JVD Endocrine: No thryomegaly Cardiac: Normal S1, S2; RRR; 2 out of 6 systolic ejection murmur Lungs: Diminished breath sounds at the lung bases, right greater than left Abd: Soft, nontender, no hepatomegaly  Ext: Trace edema Musculoskeletal: No deformities, BUE and BLE strength normal and equal Skin: Warm and dry, no rashes   Neuro:  Alert and oriented to person, place, time, and situation, CNII-XII grossly intact, no focal deficits  Psych: Normal mood and affect   Labs  High Sensitivity Troponin:   Recent Labs  Lab 08/18/20 0100 08/18/20 0339 08/18/20 0636 08/18/20 1050  TROPONINIHS 1,826* 5,960* 8,667* 16,847*     Cardiac EnzymesNo results for input(s): TROPONINI in the last 168 hours. No results for input(s): TROPIPOC in the last 168 hours.  Chemistry Recent Labs  Lab 08/18/20 0100 08/18/20 0636 08/21/20 2012 08/22/20 0508 08/22/20 2021 08/22/20 2229 08/22/20 2335 08/23/20 0351 08/24/20 0514  NA 138   < > 128*   < > 135   < > 137 133* 130*  K 3.7   < > 3.8   < > 4.7   < > 4.2 4.2 4.1  CL 105   < > 91*   < > 106  --   --  105 98  CO2 20*   < > 25   < > 19*  --   --  19* 24  GLUCOSE 185*   < > 229*   < > 144*  --   --  145* 223*  BUN 19   < > 15   < > 13  --   --  10 8  CREATININE 1.04   < > 0.97   < > 0.93  --   --  0.84 0.92  CALCIUM 8.9   < > 9.3   < > 7.8*  --   --  7.6* 8.3*  PROT 6.9  --  7.5  --   --   --   --   --  5.8*  ALBUMIN 3.4*  --  3.7  --   --   --   --   --  2.9*  AST 38  --  24  --   --   --   --   --  22  ALT 24  --  29  --   --   --   --   --  19  ALKPHOS 49  --  65  --   --   --   --   --  46  BILITOT 0.6  --  1.0  --   --   --   --   --  1.2  GFRNONAA >60   < > >60   < > >60  --   --  >60 >60  ANIONGAP 13   < >  12   < > 10  --   --  9 8   < > = values in this interval not displayed.    Hematology Recent Labs  Lab 08/23/20 0351 08/23/20 1608 08/24/20 0514  WBC 13.8* 17.1* 17.2*  RBC 3.56* 3.86* 3.80*  HGB 10.6* 11.3* 10.8*  HCT 30.5* 33.1* 33.1*  MCV 85.7 85.8 87.1  MCH 29.8 29.3 28.4  MCHC 34.8 34.1 32.6  RDW 13.0 13.2 13.2  PLT 149* 176 149*   BNP Recent Labs  Lab 08/18/20 0100  BNP 85.3    DDimer No results for input(s): DDIMER in the last 168 hours.   Radiology  DG Chest Port 1 View  Result Date: 08/23/2020 CLINICAL DATA:  Post cardiac surgery EXAM:  PORTABLE CHEST 1 VIEW COMPARISON:  08/22/2020 FINDINGS: Right IJ Swan-Ganz catheter tip overlies the distal main pulmonary artery. Mediastinal drain and left-sided chest tube are present. Endotracheal tube and enteric tube are no longer present. No pneumothorax. No significant pleural effusion. Left basilar atelectasis. Stable cardiomediastinal contours. IMPRESSION: Lines and tubes as above. No pneumothorax. Left basilar atelectasis. Electronically Signed   By: Guadlupe SpanishPraneil  Patel M.D.   On: 08/23/2020 07:57   DG CHEST PORT 1 VIEW  Result Date: 08/22/2020 CLINICAL DATA:  NG tube placement. EXAM: PORTABLE CHEST 1 VIEW COMPARISON:  Similar 20 second, 2021. FINDINGS: Status post CABG and atrial clipping with median sternotomy. Similar positioning of the endotracheal tube, Swan-Ganz catheter, left-sided chest tube, mediastinal drain. A gastric tube courses below the diaphragm with the tip projecting expected region the stomach. Similar low lung volumes with left basilar atelectasis. No visible pleural effusions or pneumothorax. Similar enlarged cardiac silhouette, which is accentuated by AP portable technique. IMPRESSION: 1. Gastric tube courses below the diaphragm with the tip projecting in expected region of the stomach. Additional support devices are stable. 2. Similar low lung volumes with left basilar atelectasis. Electronically Signed   By: Feliberto HartsFrederick S Jones MD   On: 08/22/2020 15:25   DG Chest Port 1 View  Result Date: 08/22/2020 CLINICAL DATA:  Pneumothorax, follow-up evaluation EXAM: PORTABLE CHEST 1 VIEW COMPARISON:  December twenty-first of 2021 FINDINGS: RIGHT-sided vascular sheath with Swan-Ganz catheter in place, tip in the main pulmonary artery. Endotracheal tube approximately 3.2 cm above the carina. Chest support tube projecting over the mediastinum following median sternotomy for atrial clipping and CABG. This enters via inferior approach terminating over the mediastinum near the second sternal  wire, second from the top. Gastric tube in place may be coiled within the hypopharynx, tip off the field of the radiograph. LEFT-sided chest tube in place.  No visible pneumothorax. Patchy areas of linear airspace disease and predominantly linear opacities at the lung bases greatest in the RIGHT mid chest. Cardiomediastinal contours mildly enlarged post median sternotomy. The mild fullness of RIGHT and LEFT hilum. On limited assessment no acute skeletal process. IMPRESSION: 1. Interval sternotomy for LEFT atrial clipping and CABG with mediastinal drain and chest tube in place, no visible pneumothorax on supine radiograph. 2. Gastric tube which is likely coiled in the hypopharynx. Tube tracks inferiorly otherwise as expected passing below the LEFT hemidiaphragm. 3. Swan-Ganz catheter in the main pulmonary artery. 4. Patchy areas of airspace disease favored to represent atelectasis. Attention on follow-up. These results will be called to the ordering clinician or representative by the Radiologist Assistant, and communication documented in the PACS or [employer] Dashboard. Electronically Signed   By: Donzetta KohutGeoffrey  Wile M.D.   On: 08/22/2020 14:49  Cardiac Studies  LHC 08/18/2020   Dist LM to Ost LAD lesion is 80% stenosed.  Mid Cx lesion is 80% stenosed.  Ost Cx lesion is 75% stenosed.  Prox RCA lesion is 80% stenosed.  LV end diastolic pressure is moderately elevated. LVEDP 24 mm Hg.  There is no aortic valve stenosis.  Tortuosity of the right subclavian artery noted.   Severe left main disease.  Plan for surgical consult.    IV Lasix for increased LVEDP.   Aggressive secondary prevention.  TTE 08/18/2020 1. Left ventricular ejection fraction, by estimation, is 45 to 50%. The  left ventricle has mildly decreased function. The basal-to-mid lateral  wall appears moderately hypokinetic based on limited views. The rest of  the LV segments appear mildly  hypokinetic.There is mild concentric  left ventricular hypertrophy. Left  ventricular diastolic parameters are consistent with Grade II diastolic  dysfunction (pseudonormalization).  2. Right ventricular systolic function is normal. The right ventricular  size is normal.  3. The mitral valve is degenerative. Mild to moderate mitral valve  regurgitation. Moderate mitral annular calcification.  4. The aortic valve is tricuspid. There is moderate calcification of the  aortic valve. There is moderate thickening of the aortic valve. Aortic  valve regurgitation is not visualized. There is moderate aortic stenosis  with AVA 1.0cm2, mean gradient  , peak velocity 2.8m/s, DOI 0.34. LVOT VTI 18.  5. The inferior vena cava is normal in size with greater than 50%  respiratory variability, suggesting right atrial pressure of 3 mmHg.     Patient Profile  Bradie Lacock a 54 y.o.malewith diabetes, hypertension, OSA who was admitted on 08/18/2020 with A. fib with RVR and non-STEMI.   Status post CABG on 08/22/2020 with left atrial appendage clipping.  Assessment & Plan   1. NSTEMI/3v CAD -Admitted 08/18/2020 with A. fib with RVR, non-STEMI and CHF.  Found to have three-vessel CAD. -CABG x4 on 08/22/2020.  LIMA to LAD, left radial artery to diagonal, vein graft to circumflex, vein graft to PDA -Continue care postoperatively per surgery. -Aspirin 81 mg a day.  Will need to go out on Plavix 75 mg a day due to non-STEMI. -Wean inotropes as you are able. -Diuresis per CT surgery  2.  A. fib with RVR status post left atrial appendage clipping -Secondary to non-STEMI.  Status post left atrial appendage clipping.  This appears to be secondary A. Fib. -CHADSVASC=3 (HTN, CHF, DM). -Given that he has had left atrial appendage clipping, I think he can forego long-term anticoagulation.  This is secondary atrial fibrillation the setting of non-STEMI.  3.  Ischemic cardiomyopathy, EF 40-45% -Would recommend to get back on a  beta-blocker, ARB and Aldactone once out of the ICU. -He will also benefit from an SGLT2 inhibitor at discharge  4.  Mild to moderate aortic stenosis -Aortic valve replacement deferred.  This will need follow-up in the outpatient setting.  For questions or updates, please contact [facility] Please consult [email or web address] for contact info under   Time Spent with Patient: I have spent a total of 25 minutes with patient reviewing hospital notes, telemetry, EKGs, labs and examining the patient as well as establishing an assessment and plan that was discussed with the patient.  > 50% of time was spent in direct patient care.    Signed, Lenna Gilford. Flora Lipps, MD [facility]  [facility]  08/24/2020 7:29 AM

## 2020-08-24 NOTE — Plan of Care (Signed)
  Problem: Education: Goal: Knowledge of disease or condition will improve Outcome: Progressing   Problem: Coping: Goal: Will verbalize positive feelings about self Outcome: Progressing   Problem: Activity: Goal: Risk for activity intolerance will decrease Outcome: Progressing

## 2020-08-24 NOTE — Progress Notes (Signed)
Patient ID: Johnny Nichols, male   DOB: 01-06-1966, 54 y.o.   MRN: [id number] TCTS DAILY ICU PROGRESS NOTE                   [address]            [place] [address]          [phone]   2 Days Post-Op Procedure(s) (LRB): CORONARY ARTERY BYPASS GRAFTING (CABG) TIMES FOUR, USING LEFT INTERNAL MAMMARY ARTERY, LEFT RADIAL ARTERY, AND RIGHT LEG GREATER SAPHENOUS VEIN HARVESTED ENDOSCOPICALLY (N/A) CLIPPING OF ATRIAL APPENDAGE USING ATRICURE ATRICLIP (N/A) TRANSESOPHAGEAL ECHOCARDIOGRAM (TEE) (N/A) LEFT RADIAL ARTERY HARVEST (Left) ENDOVEIN HARVEST OF GREATER SAPHENOUS VEIN (Right)  Total Length of Stay:  LOS: 6 days   Subjective: Patient awake alert neurologically intact walked in the unit this morning Remains on milrinone 0.25 Sats low normal 89-91 on nasal cannula  Objective: Vital signs in last 24 hours: Temp:  [98.2 F (36.8 C)-100 F (37.8 C)] 99.7 F (37.6 C) (12/24 0724) Pulse Rate:  [91-106] 104 (12/24 0600) Cardiac Rhythm: Normal sinus rhythm (12/24 0400) Resp:  [18-31] 30 (12/24 0600) BP: (118-144)/(67-85) 138/79 (12/24 0600) SpO2:  [87 %-94 %] 93 % (12/24 0600) Arterial Line BP: (114)/(84) 114/84 (12/23 0900) Weight:  [159 kg] 107 kg (12/24 0500)  Filed Weights   08/22/20 0608 08/23/20 0500 08/24/20 0500  Weight: 101.3 kg 107.3 kg 107 kg    Weight change: -0.3 kg   Hemodynamic parameters for last 24 hours:    Intake/Output from previous day: 12/23 0701 - 12/24 0700 In: 1059.2 [P.O.:200; I.V.:659.2; IV Piggyback:200] Out: 4500 [Urine:4340; Chest Tube:160]  Intake/Output this shift: No intake/output data recorded.  Current Meds: Scheduled Meds: . acetaminophen  1,000 mg Oral Q6H   Or  . acetaminophen (TYLENOL) oral liquid 160 mg/5 mL  1,000 mg Per Tube Q6H  . aspirin EC  325 mg Oral Daily   Or  . aspirin  324 mg Per Tube Daily  . atorvastatin  80 mg Oral Daily  . bisacodyl  10 mg Oral Daily   Or  . bisacodyl  10 mg  Rectal Daily  . Chlorhexidine Gluconate Cloth  6 each Topical Daily  . Chlorhexidine Gluconate Cloth  6 each Topical Daily  . docusate sodium  200 mg Oral Daily  . insulin aspart  0-24 Units Subcutaneous TID AC & HS  . insulin detemir  23 Units Subcutaneous Daily  . isosorbide mononitrate  15 mg Oral Daily  . metoprolol tartrate  12.5 mg Oral BID   Or  . metoprolol tartrate  12.5 mg Per Tube BID  . pantoprazole  40 mg Oral Daily  . sodium chloride flush  10-40 mL Intracatheter Q12H  . sodium chloride flush  3 mL Intravenous Q12H   Continuous Infusions: . sodium chloride Stopped (08/22/20 1543)  . sodium chloride    . sodium chloride    . dexmedetomidine (PRECEDEX) IV infusion Stopped (08/22/20 1727)  . lactated ringers    . lactated ringers    . lactated ringers 20 mL/hr at 08/24/20 0600  . milrinone 0.25 mcg/kg/min (08/24/20 0600)  . phenylephrine (NEO-SYNEPHRINE) Adult infusion Stopped (08/23/20 0516)   PRN Meds:.sodium chloride, dextrose, lactated ringers, metoprolol tartrate, midazolam, ondansetron (ZOFRAN) IV, oxyCODONE, sodium chloride flush, sodium chloride flush, traMADol  General appearance: alert and cooperative Neurologic: intact Heart: systolic murmur: early systolic 2/6, crescendo at 2nd left intercostal space Lungs: diminished breath sounds bibasilar Abdomen: soft, non-tender; bowel  sounds normal; no masses,  no organomegaly Extremities: extremities normal, atraumatic, no cyanosis or edema and Homans sign is negative, no sign of DVT Wound: Dressing intact sternum stable  Lab Results: CBC: Recent Labs    08/23/20 1608 08/24/20 0514  WBC 17.1* 17.2*  HGB 11.3* 10.8*  HCT 33.1* 33.1*  PLT 176 149*   BMET:  Recent Labs    08/23/20 0351 08/24/20 0514  NA 133* 130*  K 4.2 4.1  CL 105 98  CO2 19* 24  GLUCOSE 145* 223*  BUN 10 8  CREATININE 0.84 0.92  CALCIUM 7.6* 8.3*    CMET: Lab Results  Component Value Date   WBC 17.2 (H) 08/24/2020   HGB 10.8  (L) 08/24/2020   HCT 33.1 (L) 08/24/2020   PLT 149 (L) 08/24/2020   GLUCOSE 223 (H) 08/24/2020   CHOL 163 08/18/2020   TRIG 63 08/18/2020   HDL 40 (L) 08/18/2020   LDLCALC 110 (H) 08/18/2020   ALT 19 08/24/2020   AST 22 08/24/2020   NA 130 (L) 08/24/2020   K 4.1 08/24/2020   CL 98 08/24/2020   CREATININE 0.92 08/24/2020   BUN 8 08/24/2020   CO2 24 08/24/2020   TSH 0.906 08/18/2020   INR 1.3 (H) 08/22/2020   HGBA1C 7.4 (H) 08/18/2020   MICROALBUR 1.4 10/11/2015      PT/INR:  Recent Labs    08/22/20 1441  LABPROT 15.8*  INR 1.3*   Radiology: [facility] Chest Port 1 View  Result Date: 08/24/2020 CLINICAL DATA:  Status post open heart surgery.  Chest tube. EXAM: PORTABLE CHEST 1 VIEW COMPARISON:  08/23/2020 FINDINGS: 0517 hours. Right IJ pulmonary artery catheter has been removed in the interval. Right IJ sheath remains in place. Midline mediastinal/pericardial drain has been pole. Left chest tube remains with probable tiny left apical pneumothorax. The cardio pericardial silhouette is enlarged. Bibasilar atelectasis again noted, left greater than right. Telemetry leads overlie the chest. IMPRESSION: 1. Interval removal of pulmonary artery catheter. 2. Left chest tube with probable tiny left apical pneumothorax. 3. Cardiomegaly with bibasilar atelectasis. Electronically Signed   By: Kennith Center M.D.   On: 08/24/2020 07:46     Assessment/Plan: S/P Procedure(s) (LRB): CORONARY ARTERY BYPASS GRAFTING (CABG) TIMES FOUR, USING LEFT INTERNAL MAMMARY ARTERY, LEFT RADIAL ARTERY, AND RIGHT LEG GREATER SAPHENOUS VEIN HARVESTED ENDOSCOPICALLY (N/A) CLIPPING OF ATRIAL APPENDAGE USING ATRICURE ATRICLIP (N/A) TRANSESOPHAGEAL ECHOCARDIOGRAM (TEE) (N/A) LEFT RADIAL ARTERY HARVEST (Left) ENDOVEIN HARVEST OF GREATER SAPHENOUS VEIN (Right) Mobilize Diuresis d/c tubes/lines Getting milrinone Expected Acute  Blood - loss Anemia- continue to monitor  Plavix and aspirin for presentation with  STEMI-will not fully anticoagulate for single episode of atrial fib at presentation Continue adjusting diabetic medications Renal function remains stable-transition back to home diabetic medications   Delight Ovens 08/24/2020 8:00 AM

## 2020-08-25 ENCOUNTER — Inpatient Hospital Stay (HOSPITAL_COMMUNITY): Payer: Self-pay

## 2020-08-25 LAB — CBC
HCT: 37.2 % — ABNORMAL LOW (ref 39.0–52.0)
Hemoglobin: 12.9 g/dL — ABNORMAL LOW (ref 13.0–17.0)
MCH: 29.3 pg (ref 26.0–34.0)
MCHC: 34.7 g/dL (ref 30.0–36.0)
MCV: 84.5 fL (ref 80.0–100.0)
Platelets: 148 10*3/uL — ABNORMAL LOW (ref 150–400)
RBC: 4.4 MIL/uL (ref 4.22–5.81)
RDW: 13.2 % (ref 11.5–15.5)
WBC: 11.5 10*3/uL — ABNORMAL HIGH (ref 4.0–10.5)
nRBC: 0 % (ref 0.0–0.2)

## 2020-08-25 LAB — BASIC METABOLIC PANEL
Anion gap: 8 (ref 5–15)
BUN: 8 mg/dL (ref 6–20)
CO2: 28 mmol/L (ref 22–32)
Calcium: 8.4 mg/dL — ABNORMAL LOW (ref 8.9–10.3)
Chloride: 99 mmol/L (ref 98–111)
Creatinine, Ser: 0.79 mg/dL (ref 0.61–1.24)
GFR, Estimated: >60 mL/min (ref >60)
Glucose, Bld: 181 mg/dL — ABNORMAL HIGH (ref 70–99)
Potassium: 3.6 mmol/L (ref 3.5–5.1)
Sodium: 135 mmol/L (ref 135–145)

## 2020-08-25 LAB — COOXEMETRY PANEL
Carboxyhemoglobin: 0.9 % (ref 0.5–1.5)
Carboxyhemoglobin: 1 % (ref 0.5–1.5)
Methemoglobin: 1 % (ref 0.0–1.5)
Methemoglobin: 1.1 % (ref 0.0–1.5)
O2 Saturation: 63.6 %
O2 Saturation: 66 %
Total hemoglobin: 11.5 g/dL — ABNORMAL LOW (ref 12.0–16.0)
Total hemoglobin: 11.3 g/dL — ABNORMAL LOW (ref 12.0–16.0)

## 2020-08-25 LAB — GLUCOSE, CAPILLARY
Glucose-Capillary: 168 mg/dL — ABNORMAL HIGH (ref 70–99)
Glucose-Capillary: 233 mg/dL — ABNORMAL HIGH (ref 70–99)
Glucose-Capillary: 312 mg/dL — ABNORMAL HIGH (ref 70–99)
Glucose-Capillary: 308 mg/dL — ABNORMAL HIGH (ref 70–99)

## 2020-08-25 MED ORDER — POTASSIUM CHLORIDE CRYS ER 20 MEQ PO TBCR
30.0000 meq | EXTENDED_RELEASE_TABLET | Freq: Three times a day (TID) | ORAL | Status: AC
  Administered 2020-08-25 – 2020-08-26 (×3): 30 meq via ORAL
  Filled 2020-08-25 (×3): qty 1

## 2020-08-25 MED ORDER — INSULIN DETEMIR 100 UNIT/ML ~~LOC~~ SOLN
35.0000 [IU] | Freq: Two times a day (BID) | SUBCUTANEOUS | Status: DC
  Administered 2020-08-25 – 2020-08-28 (×6): 35 [IU] via SUBCUTANEOUS
  Filled 2020-08-25 (×8): qty 0.35

## 2020-08-25 NOTE — Op Note (Signed)
NAMECEASAR, Nichols MEDICAL RECORD ZO:[id number] ACCOUNT [id number] DATE OF BIRTH:[birth year] FACILITY: [facility] LOCATION: [facility] PHYSICIAN:Chelcee Korpi BTyrone Sage, MD  OPERATIVE REPORT  DATE OF PROCEDURE:  08/22/2020  PREOPERATIVE DIAGNOSIS:  Recent acute myocardial infarction with left main obstruction, mild to moderate aortic stenosis.  POSTOPERATIVE DIAGNOSIS:  Recent acute myocardial infarction with left main obstruction, mild to moderate aortic stenosis with presentation with acute atrial fibrillation.  PROCEDURE PERFORMED:  Coronary artery bypass grafting x4 with the left internal mammary to the left anterior descending coronary artery, reverse saphenous vein graft to the diagonal coronary artery with left radial artery, reverse saphenous vein graft to  the circumflex coronary artery, reverse vein graft to the posterior descending coronary artery arising from the distal circumflex with left radial artery harvest and endoscopic vein harvesting, right thigh greater saphenous vein and placement of a 40 mm  AtriClip and TEE.  SURGEON:  [name], MD  FIRST ASSISTANT:  [name], PA.  SECOND ASSISTANT:  [name], PA.  BRIEF HISTORY:  The patient is a 54 year old severely diabetic male who 7 years previously presented in coma with diabetic ketoacidosis.  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  He had been seen by  EMS on several occasions, but did not seek care in the emergency room.  At the time of admission, he became increasingly short of breath, coughing up pink frothy foam and was brought by EMS directly to the emergency room in severe acute heart failure and  rapid atrial fibrillation.  He was seen by cardiology, stabilized medically and then underwent cardiac catheterization by [name].  Cardiac catheterization showed evidence of a small nondominant right coronary system with 80% distal left main into  the  dominant left circumflex and into the LAD.  Ventriculogram was not done at the time of catheterization due to significantly elevated left end-diastolic pressures.  Report notes no aortic stenosis.  CT scan of the chest showed bilateral diffuse pulmonary  infiltrates, thought to be related to pulmonary edema, but also could be atypical pulmonary infection including COVID.  This COVID test was negative.  He was stabilized medically, diuresed and overall his symptoms improved and cardiac surgery was  consulted.  Echocardiogram showed 40% ejection fraction suggested mild to moderate aortic stenosis.  With the patient's history a second COVID test was performed after admission, which was negative.  Chest x-ray cleared with 2 days of diuresis and with  his critical anatomy, we recommended proceeding with coronary artery bypass grafting.  In addition, we discussed the possibility of aortic valve replacement depending on findings in TEE.  We discussed types of aortic valves.  The patient adamantly did  not wish to take Coumadin.  He was agreeable and signed informed consent.  DESCRIPTION OF PROCEDURE:  The patient underwent general endotracheal anesthesia without incident.  Preoperative Dopplers on the left arm had showed preserved arterial supply to the hand with occlusion of the radial artery.  We prepped the left arm out,  chest and legs were prepped with Betadine, draped in a sterile manner.  Appropriate timeout was performed and we proceeded with open harvest of the left radial artery.  The patient's forearm was slightly short and this supplied approximately 3/4 of a  scissor length graft, but of good quality.  Additional vein was harvested from the right greater saphenous vein endoscopically and was of good quality and caliber.  Median sternotomy was performed.  The left internal mammary artery was dissected down  as  a pedicle graft.  The distal artery was divided and had good free flow.  The vessel was  hydrostatically dilated.  After intubation, a TEE probe was placed and careful interrogation of the heart was done.  The patient had mild to moderate central mitral  regurgitation, no aortic regurgitation.  He had a trileaflet aortic valve that did show areas of calcification.  Telemetry, the aortic valve area was 1.3.  The velocity across the outflow tract, the valve was 2.7, peak gradient at 31, mean at 18.  With  these findings we decided that especially since the patient did not want a mechanical valve and with more than mild, but no more than moderate aortic stenosis that we would not replace the valve with a tissue valve at this time, but consider for a TAVR  valve in the future as the need dictates.  The patient was systemically heparinized.  Ascending aorta was cannulated.  Right atrium was cannulated.  An aortic root vent cardioplegia needle was introduced into the ascending aorta.  The patient was placed  on cardiopulmonary bypass, 2.4 liters per minute per meter square.  The patient's body temperature was cooled to 32 degrees.  Aortic crossclamp was applied; 500 mL of cold blood potassium cardioplegia was administered with diastolic arrest of the heart.   Myocardial septal temperatures monitored throughout the crossclamp.  Attention was turned first to the posterior descending as it arose from the distal circumflex.  This vessel was relatively small opening, but admitted 1.5 mm probe distally and using a  running 7-0 Prolene distal anastomosis was performed with a segment of reverse saphenous vein graft.  Heart was then elevated and the large circumflex branch supplying the lateral wall was identified, opened and admitted 1.5 mm probe distally.  Using a  running 7-0 Prolene, distal anastomosis was performed with a segment of reverse saphenous vein graft.  The radial artery was not long enough graft to place to the circumflex.  We then turned our attention to the diagonal coronary artery, which was  opened  and admitted 1.5 mm probe.  Using a running 8-0 Prolene, the segment of left radial artery was anastomosed to the diagonal.  We then turned our attention to the left anterior descending coronary artery between the mid and distal third of the vessel was  opened and admitted 1.5 mm probe proximally and 1 mm probe distally.  Using a running 8-0 Prolene, the left internal mammary artery was anastomosed to the left anterior descending coronary artery.  With release of the bulldog on the mammary artery, there  was prompt rise in myocardial septal temperature.  Additional cold blood cardioplegia was administered down the vein grafts.  The bulldog was placed back on the mammary artery, elevated the heart and had previously measured the atrial appendage for 40  mm AtriCure atrial clip.  This was placed at the base of the left atrial appendage and seated well.  We then turned our attention to the proximal anastomosis.  Two punch aortotomies were done in the ascending aorta.  Each of the 2 vein grafts were  anastomosed to the ascending aorta.  An opening was made in the hood of the vein graft to the distal circumflex vessel.  At this point, using a running 7-0 Prolene, the radial artery graft was sewed to the hood of the vein graft to the circumflex.  The  heart was allowed to passively deair and the anastomosis completed.  Aortic crossclamp was removed with total  crossclamp time of 107 minutes.  The patient spontaneously converted to a sinus rhythm.  Sites of anastomosis were inspected and were free of  bleeding.  The patient's body temperature was rewarmed to 37 degrees.  He had been started on low dose dopamine and milrinone infusion.  We then separated easily from cardiopulmonary bypass after been on ventilation and remained hemodynamically stable.   Cardiac output was measured as high as 10 L with him getting slightly tachycardic with dopamine.  This was discontinued.  He was decannulated in the usual  fashion.  Protamine sulfate was administered.  Graft markers were applied.  Atrial and ventricular  pacing wires were applied.  A left pleural tube and Blake mediastinal drain were left in place.  Pericardium was loosely reapproximated.  Sternum closed with #6 stainless steel wire.  Fascia closed with interrupted 0 Vicryl, running 3-0 Vicryl  subcutaneous tissue, 3-0 subcuticular stitches in the skin edges.  Dry dressings were applied.  Sponge and needle count was reported as correct at completion of procedure.  After closure of the chest, cardiac index was 2.5 on low dose milrinone only.  He  remained in sinus rhythm.  Total pump time was 141 minutes.  The patient did not require any blood bank, blood products during the operative procedure.  He was then transferred to the surgical intensive care unit for further postoperative care.  HN/NUANCE  D:08/24/2020 T:08/25/2020 JOB:[id number]

## 2020-08-25 NOTE — Progress Notes (Signed)
3 Days Post-Op Procedure(s) (LRB): CORONARY ARTERY BYPASS GRAFTING (CABG) TIMES FOUR, USING LEFT INTERNAL MAMMARY ARTERY, LEFT RADIAL ARTERY, AND RIGHT LEG GREATER SAPHENOUS VEIN HARVESTED ENDOSCOPICALLY (N/A) CLIPPING OF ATRIAL APPENDAGE USING ATRICURE ATRICLIP (N/A) TRANSESOPHAGEAL ECHOCARDIOGRAM (TEE) (N/A) LEFT RADIAL ARTERY HARVEST (Left) ENDOVEIN HARVEST OF GREATER SAPHENOUS VEIN (Right) Subjective: No complaints  Objective: Vital signs in last 24 hours: Temp:  [98.1 F (36.7 C)-100.4 F (38 C)] 98.1 F (36.7 C) (12/25 1100) Pulse Rate:  [92-109] 96 (12/25 1300) Cardiac Rhythm: Normal sinus rhythm;Sinus tachycardia (12/25 0800) Resp:  [17-32] 18 (12/25 1300) BP: (111-164)/(55-103) 131/83 (12/25 1300) SpO2:  [89 %-98 %] 97 % (12/25 1300) FiO2 (%):  [40 %] 40 % (12/25 0800) Weight:  [102.9 kg] 102.9 kg (12/25 0600)  Hemodynamic parameters for last 24 hours:    Intake/Output from previous day: 12/24 0701 - 12/25 0700 In: 561.8 [I.V.:561.8] Out: 4300 [Urine:4300] Intake/Output this shift: Total I/O In: 142.3 [I.V.:142.3] Out: 1325 [Urine:1325]  General appearance: alert and cooperative Neurologic: intact Heart: regular rate and rhythm, S1, S2 normal, no murmur Lungs: clear to auscultation bilaterally Extremities: edema mild Wound: incision ok  Lab Results: Recent Labs    08/24/20 0514 08/25/20 0446  WBC 17.2* 11.5*  HGB 10.8* 12.9*  HCT 33.1* 37.2*  PLT 149* 148*   BMET:  Recent Labs    08/24/20 0514 08/25/20 0446  NA 130* 135  K 4.1 3.6  CL 98 99  CO2 24 28  GLUCOSE 223* 181*  BUN 8 8  CREATININE 0.92 0.79  CALCIUM 8.3* 8.4*    PT/INR: No results for input(s): LABPROT, INR in the last 72 hours. ABG    Component Value Date/Time   PHART 7.411 08/22/2020 2335   HCO3 21.6 08/22/2020 2335   TCO2 23 08/22/2020 2335   ACIDBASEDEF 2.0 08/22/2020 2335   O2SAT 66.0 08/25/2020 0516   CBG (last 3)  Recent Labs    08/24/20 2141 08/25/20 0641  08/25/20 1137  GLUCAP 347* 168* 233*   CXR: mild bibasilar atelectasis  Assessment/Plan: S/P Procedure(s) (LRB): CORONARY ARTERY BYPASS GRAFTING (CABG) TIMES FOUR, USING LEFT INTERNAL MAMMARY ARTERY, LEFT RADIAL ARTERY, AND RIGHT LEG GREATER SAPHENOUS VEIN HARVESTED ENDOSCOPICALLY (N/A) CLIPPING OF ATRIAL APPENDAGE USING ATRICURE ATRICLIP (N/A) TRANSESOPHAGEAL ECHOCARDIOGRAM (TEE) (N/A) LEFT RADIAL ARTERY HARVEST (Left) ENDOVEIN HARVEST OF GREATER SAPHENOUS VEIN (Right)  POD 3  Hemodynamically stable on milrinone 0.125 with Co-ox 66%.  DC milrinone and remove sleeve.  Postop atrial fib with RVR: converted with amio. Continue amio and Lopressor.  DM: glucose under better control today but 233 at lunch. Will increase Levemir to 35 bid and continue SSI.  Volume excess: wt is only a few lbs over preop. Will continue lasix today. Replace K+  IS, ambulation.   LOS: 7 days    Alleen Borne 08/25/2020

## 2020-08-26 LAB — BASIC METABOLIC PANEL
Anion gap: 11 (ref 5–15)
BUN: 10 mg/dL (ref 6–20)
CO2: 25 mmol/L (ref 22–32)
Calcium: 8.6 mg/dL — ABNORMAL LOW (ref 8.9–10.3)
Chloride: 99 mmol/L (ref 98–111)
Creatinine, Ser: 0.83 mg/dL (ref 0.61–1.24)
GFR, Estimated: >60 mL/min (ref >60)
Glucose, Bld: 99 mg/dL (ref 70–99)
Potassium: 3.6 mmol/L (ref 3.5–5.1)
Sodium: 135 mmol/L (ref 135–145)

## 2020-08-26 LAB — CBC
HCT: 32.8 % — ABNORMAL LOW (ref 39.0–52.0)
Hemoglobin: 11.3 g/dL — ABNORMAL LOW (ref 13.0–17.0)
MCH: 29.1 pg (ref 26.0–34.0)
MCHC: 34.5 g/dL (ref 30.0–36.0)
MCV: 84.5 fL (ref 80.0–100.0)
Platelets: 255 10*3/uL (ref 150–400)
RBC: 3.88 MIL/uL — ABNORMAL LOW (ref 4.22–5.81)
RDW: 13.2 % (ref 11.5–15.5)
WBC: 11.7 10*3/uL — ABNORMAL HIGH (ref 4.0–10.5)
nRBC: 0 % (ref 0.0–0.2)

## 2020-08-26 LAB — GLUCOSE, CAPILLARY
Glucose-Capillary: 152 mg/dL — ABNORMAL HIGH (ref 70–99)
Glucose-Capillary: 239 mg/dL — ABNORMAL HIGH (ref 70–99)
Glucose-Capillary: 238 mg/dL — ABNORMAL HIGH (ref 70–99)
Glucose-Capillary: 222 mg/dL — ABNORMAL HIGH (ref 70–99)

## 2020-08-26 MED ORDER — PANTOPRAZOLE SODIUM 40 MG PO TBEC
40.0000 mg | DELAYED_RELEASE_TABLET | Freq: Every day | ORAL | Status: DC
  Administered 2020-08-27 – 2020-08-28 (×2): 40 mg via ORAL
  Filled 2020-08-26 (×3): qty 1

## 2020-08-26 MED ORDER — METOPROLOL TARTRATE 25 MG PO TABS: 25.0000 mg | ORAL_TABLET | Freq: Two times a day (BID) | ORAL | Status: DC

## 2020-08-26 MED ORDER — SODIUM CHLORIDE 0.9% FLUSH: 10.0000 mL | INTRAVENOUS | Status: DC | PRN

## 2020-08-26 MED ORDER — SODIUM CHLORIDE 0.9% FLUSH
10.0000 mL | Freq: Two times a day (BID) | INTRAVENOUS | Status: DC
  Administered 2020-08-26 – 2020-08-28 (×4): 10 mL

## 2020-08-26 MED ORDER — SODIUM CHLORIDE 0.9% FLUSH: 3.0000 mL | INTRAVENOUS | Status: DC | PRN

## 2020-08-26 MED ORDER — AMIODARONE IV BOLUS ONLY 150 MG/100ML
150.0000 mg | Freq: Once | INTRAVENOUS | Status: AC
  Administered 2020-08-26: 150 mg via INTRAVENOUS
  Filled 2020-08-26: qty 100

## 2020-08-26 MED ORDER — SODIUM CHLORIDE 0.9% FLUSH
3.0000 mL | Freq: Two times a day (BID) | INTRAVENOUS | Status: DC
  Administered 2020-08-26 – 2020-08-28 (×3): 3 mL via INTRAVENOUS

## 2020-08-26 MED ORDER — SENNOSIDES-DOCUSATE SODIUM 8.6-50 MG PO TABS: 1.0000 | ORAL_TABLET | Freq: Two times a day (BID) | ORAL | Status: DC | PRN

## 2020-08-26 MED ORDER — CEPHALEXIN 500 MG PO CAPS
500.0000 mg | ORAL_CAPSULE | Freq: Four times a day (QID) | ORAL | Status: DC
  Administered 2020-08-26 – 2020-08-28 (×9): 500 mg via ORAL
  Filled 2020-08-26 (×10): qty 1

## 2020-08-26 MED ORDER — METOPROLOL TARTRATE 25 MG PO TABS
25.0000 mg | ORAL_TABLET | Freq: Two times a day (BID) | ORAL | Status: DC
  Administered 2020-08-26: 25 mg via ORAL
  Filled 2020-08-26: qty 1

## 2020-08-26 MED ORDER — SODIUM CHLORIDE 0.9 % IV SOLN: 250.0000 mL | INTRAVENOUS | Status: DC | PRN

## 2020-08-26 MED ORDER — ONDANSETRON HCL 4 MG PO TABS: 4.0000 mg | ORAL_TABLET | Freq: Four times a day (QID) | ORAL | Status: DC | PRN

## 2020-08-26 MED ORDER — ACETAMINOPHEN 325 MG PO TABS
650.0000 mg | ORAL_TABLET | Freq: Four times a day (QID) | ORAL | Status: DC | PRN
  Administered 2020-08-26 – 2020-08-28 (×3): 650 mg via ORAL
  Filled 2020-08-26 (×4): qty 2

## 2020-08-26 MED ORDER — ~~LOC~~ CARDIAC SURGERY, PATIENT & FAMILY EDUCATION
Freq: Once | Status: DC
  Filled 2020-08-26: qty 1

## 2020-08-26 MED ORDER — METOPROLOL TARTRATE 25 MG/10 ML ORAL SUSPENSION: 25.0000 mg | Freq: Two times a day (BID) | ORAL | Status: DC

## 2020-08-26 MED ORDER — METFORMIN HCL 500 MG PO TABS
1000.0000 mg | ORAL_TABLET | Freq: Two times a day (BID) | ORAL | Status: DC
  Administered 2020-08-26 – 2020-08-28 (×4): 1000 mg via ORAL
  Filled 2020-08-26 (×4): qty 2

## 2020-08-26 MED ORDER — ONDANSETRON HCL 4 MG/2ML IJ SOLN: 4.0000 mg | Freq: Four times a day (QID) | INTRAMUSCULAR | Status: DC | PRN

## 2020-08-26 MED ORDER — ASPIRIN EC 81 MG PO TBEC
81.0000 mg | DELAYED_RELEASE_TABLET | Freq: Every day | ORAL | Status: DC
  Administered 2020-08-27 – 2020-08-28 (×2): 81 mg via ORAL
  Filled 2020-08-26 (×2): qty 1

## 2020-08-26 MED ORDER — POTASSIUM CHLORIDE CRYS ER 20 MEQ PO TBCR
20.0000 meq | EXTENDED_RELEASE_TABLET | ORAL | Status: AC
  Administered 2020-08-26 (×3): 20 meq via ORAL
  Filled 2020-08-26 (×3): qty 1

## 2020-08-26 NOTE — Progress Notes (Signed)
4 Days Post-Op Procedure(s) (LRB): CORONARY ARTERY BYPASS GRAFTING (CABG) TIMES FOUR, USING LEFT INTERNAL MAMMARY ARTERY, LEFT RADIAL ARTERY, AND RIGHT LEG GREATER SAPHENOUS VEIN HARVESTED ENDOSCOPICALLY (N/A) CLIPPING OF ATRIAL APPENDAGE USING ATRICURE ATRICLIP (N/A) TRANSESOPHAGEAL ECHOCARDIOGRAM (TEE) (N/A) LEFT RADIAL ARTERY HARVEST (Left) ENDOVEIN HARVEST OF GREATER SAPHENOUS VEIN (Right) Subjective: No complaints  Objective: Vital signs in last 24 hours: Temp:  [98.1 F (36.7 C)-99 F (37.2 C)] 99 F (37.2 C) (12/26 0810) Pulse Rate:  [42-122] 89 (12/26 1000) Cardiac Rhythm: Normal sinus rhythm (12/26 0745) Resp:  [16-32] 24 (12/26 1000) BP: (93-139)/(66-110) 127/79 (12/26 1000) SpO2:  [91 %-100 %] 94 % (12/26 1000) Weight:  [101.7 kg] 101.7 kg (12/26 0500)  Hemodynamic parameters for last 24 hours:    Intake/Output from previous day: 12/25 0701 - 12/26 0700 In: 265.8 [I.V.:265.8] Out: 2815 [Urine:2815] Intake/Output this shift: Total I/O In: 240 [P.O.:240] Out: -   General appearance: alert and cooperative Neurologic: intact Heart: regular rate and rhythm, S1, S2 normal, no murmur, click, rub or gallop Lungs: clear to auscultation bilaterally Extremities: extremities normal, atraumatic, no cyanosis or edema Wound: some drainage from lower end of sternal incision. It is intact and no erythema. Looks like fat necrosis. Sternum feels stable.  Lab Results: Recent Labs    08/25/20 0446 08/26/20 0217  WBC 11.5* 11.7*  HGB 12.9* 11.3*  HCT 37.2* 32.8*  PLT 148* 255   BMET:  Recent Labs    08/25/20 0446 08/26/20 0217  NA 135 135  K 3.6 3.6  CL 99 99  CO2 28 25  GLUCOSE 181* 99  BUN 8 10  CREATININE 0.79 0.83  CALCIUM 8.4* 8.6*    PT/INR: No results for input(s): LABPROT, INR in the last 72 hours. ABG    Component Value Date/Time   PHART 7.411 08/22/2020 2335   HCO3 21.6 08/22/2020 2335   TCO2 23 08/22/2020 2335   ACIDBASEDEF 2.0 08/22/2020  2335   O2SAT 66.0 08/25/2020 0516   CBG (last 3)  Recent Labs    08/25/20 1517 08/25/20 2117 08/26/20 0624  GLUCAP 312* 308* 152*    Assessment/Plan: S/P Procedure(s) (LRB): CORONARY ARTERY BYPASS GRAFTING (CABG) TIMES FOUR, USING LEFT INTERNAL MAMMARY ARTERY, LEFT RADIAL ARTERY, AND RIGHT LEG GREATER SAPHENOUS VEIN HARVESTED ENDOSCOPICALLY (N/A) CLIPPING OF ATRIAL APPENDAGE USING ATRICURE ATRICLIP (N/A) TRANSESOPHAGEAL ECHOCARDIOGRAM (TEE) (N/A) LEFT RADIAL ARTERY HARVEST (Left) ENDOVEIN HARVEST OF GREATER SAPHENOUS VEIN (Right)  POD 4 Hemodynamically stable in sinus rhythm this am.   Postop atrial fib with RVR. Had some recurrent AF yesterday converted with a bolus of amio. Continue po amio and Lopressor. No Coumadin per EBG due to medication non-compliance. Has LAA clip.  DM: labile glucose. It was 312 yesterday afternoon, 99 this am on lab draw. I think highs are due to dietary sugary foods. Continue levemir and SSI. Resume Metformin.  Volume excess resolved.  Continue IS, ambulation.  Transfer to 4E.   LOS: 8 days    Alleen Borne 08/26/2020

## 2020-08-26 NOTE — Progress Notes (Signed)
Patient received from [facility] in to 4E13. Skin assessment and CHG completed. Vital signs taken and charted. No noted complications and patient states no complaints. Will continue to monitor.   -Estella Husk, RN

## 2020-08-27 ENCOUNTER — Telehealth: Payer: Self-pay | Admitting: Physician Assistant

## 2020-08-27 ENCOUNTER — Inpatient Hospital Stay (HOSPITAL_COMMUNITY): Payer: Self-pay

## 2020-08-27 DIAGNOSIS — E669 Obesity, unspecified: Secondary | ICD-10-CM

## 2020-08-27 DIAGNOSIS — E1169 Type 2 diabetes mellitus with other specified complication: Secondary | ICD-10-CM

## 2020-08-27 LAB — CBC
HCT: 35.5 % — ABNORMAL LOW (ref 39.0–52.0)
Hemoglobin: 12.1 g/dL — ABNORMAL LOW (ref 13.0–17.0)
MCH: 29.3 pg (ref 26.0–34.0)
MCHC: 34.1 g/dL (ref 30.0–36.0)
MCV: 86 fL (ref 80.0–100.0)
Platelets: 330 10*3/uL (ref 150–400)
RBC: 4.13 MIL/uL — ABNORMAL LOW (ref 4.22–5.81)
RDW: 13.2 % (ref 11.5–15.5)
WBC: 10.6 10*3/uL — ABNORMAL HIGH (ref 4.0–10.5)
nRBC: 0 % (ref 0.0–0.2)

## 2020-08-27 LAB — BASIC METABOLIC PANEL
Anion gap: 16 — ABNORMAL HIGH (ref 5–15)
BUN: 11 mg/dL (ref 6–20)
CO2: 22 mmol/L (ref 22–32)
Calcium: 9.6 mg/dL (ref 8.9–10.3)
Chloride: 101 mmol/L (ref 98–111)
Creatinine, Ser: 0.95 mg/dL (ref 0.61–1.24)
GFR, Estimated: >60 mL/min (ref >60)
Glucose, Bld: 78 mg/dL (ref 70–99)
Potassium: 4.1 mmol/L (ref 3.5–5.1)
Sodium: 139 mmol/L (ref 135–145)

## 2020-08-27 LAB — GLUCOSE, CAPILLARY
Glucose-Capillary: 81 mg/dL (ref 70–99)
Glucose-Capillary: 185 mg/dL — ABNORMAL HIGH (ref 70–99)
Glucose-Capillary: 192 mg/dL — ABNORMAL HIGH (ref 70–99)
Glucose-Capillary: 237 mg/dL — ABNORMAL HIGH (ref 70–99)

## 2020-08-27 MED ORDER — AMIODARONE HCL 200 MG PO TABS
400.0000 mg | ORAL_TABLET | Freq: Two times a day (BID) | ORAL | Status: DC
  Administered 2020-08-27 – 2020-08-28 (×3): 400 mg via ORAL
  Filled 2020-08-27 (×3): qty 2

## 2020-08-27 MED ORDER — ASPIRIN 81 MG PO TBEC: 81.0000 mg | DELAYED_RELEASE_TABLET | Freq: Every day | ORAL | 11 refills | Status: AC

## 2020-08-27 MED ORDER — POTASSIUM CHLORIDE CRYS ER 20 MEQ PO TBCR
20.0000 meq | EXTENDED_RELEASE_TABLET | Freq: Every day | ORAL | Status: DC
  Administered 2020-08-27 – 2020-08-28 (×2): 20 meq via ORAL
  Filled 2020-08-27 (×2): qty 1

## 2020-08-27 MED ORDER — FUROSEMIDE 40 MG PO TABS
40.0000 mg | ORAL_TABLET | Freq: Every day | ORAL | Status: DC
  Administered 2020-08-27 – 2020-08-28 (×2): 40 mg via ORAL
  Filled 2020-08-27 (×2): qty 1

## 2020-08-27 MED ORDER — METOPROLOL TARTRATE 25 MG PO TABS
37.5000 mg | ORAL_TABLET | Freq: Two times a day (BID) | ORAL | Status: DC
  Administered 2020-08-27 – 2020-08-28 (×3): 37.5 mg via ORAL
  Filled 2020-08-27 (×3): qty 1

## 2020-08-27 NOTE — Telephone Encounter (Signed)
    Attention TOC pool,  This patient will need a TOC phone call after discharge. They are being discharged soon, date TBD Follow-up appointment has already been arranged with: Edd Fabian 09/08/19 They are a patient of Reatha Harps, MD.  Thank you! Laurann Montana, PA-C

## 2020-08-27 NOTE — Progress Notes (Addendum)
      [address]       [place] [address]             [phone]        5 Days Post-Op Procedure(s) (LRB): CORONARY ARTERY BYPASS GRAFTING (CABG) TIMES FOUR, USING LEFT INTERNAL MAMMARY ARTERY, LEFT RADIAL ARTERY, AND RIGHT LEG GREATER SAPHENOUS VEIN HARVESTED ENDOSCOPICALLY (N/A) CLIPPING OF ATRIAL APPENDAGE USING ATRICURE ATRICLIP (N/A) TRANSESOPHAGEAL ECHOCARDIOGRAM (TEE) (N/A) LEFT RADIAL ARTERY HARVEST (Left) ENDOVEIN HARVEST OF GREATER SAPHENOUS VEIN (Right)  Subjective: Patient had bowel movement. He is able to cough up sputum  Objective: Vital signs in last 24 hours: Temp:  [98.2 F (36.8 C)-99.7 F (37.6 C)] 98.6 F (37 C) (12/27 0338) Pulse Rate:  [83-96] 89 (12/27 0338) Cardiac Rhythm: Atrial fibrillation (12/26 2304) Resp:  [16-28] 18 (12/27 0338) BP: (107-141)/(69-85) 133/69 (12/27 0338) SpO2:  [92 %-96 %] 94 % (12/27 0338) Weight:  [101.9 kg] 101.9 kg (12/27 0500)  Pre op weight 101.3 kg Current Weight  08/27/20 101.9 kg       Intake/Output from previous day: 12/26 0701 - 12/27 0700 In: 920 [P.O.:920] Out: 1100 [Urine:1100]   Physical Exam:  Cardiovascular: RRR, +++ murmur heard best along left sternal border Pulmonary: Slightly diminished bibasilar breath sounds Abdomen: Soft, non tender, bowel sounds present. Extremities: Mild bilateral lower extremity edema. LUE motor sensory intact;small area of minor numbness along lateral incision Wounds: RLE wound is clean and dry.  No erythema or signs of infection. Serous drainage from lower sternal wound.  Lab Results: CBC: Recent Labs    08/25/20 0446 08/26/20 0217  WBC 11.5* 11.7*  HGB 12.9* 11.3*  HCT 37.2* 32.8*  PLT 148* 255   BMET:  Recent Labs    08/25/20 0446 08/26/20 0217  NA 135 135  K 3.6 3.6  CL 99 99  CO2 28 25  GLUCOSE 181* 99  BUN 8 10  CREATININE 0.79 0.83  CALCIUM 8.4* 8.6*    PT/INR:  Lab Results  Component Value Date   INR 1.3 (H)  08/22/2020   INR 1.0 08/18/2020   ABG:  INR: Will add last result for INR, ABG once components are confirmed Will add last 4 CBG results once components are confirmed  Assessment/Plan:  1. CV - S/p STEMI. Brief intermittent episodes of a fib. S/p LA clip. No Coumadin per EBG secondary to him being non compliant. On Lopressor 25 mg bid, Imdur 15 mg daily, Plavix 75 mg daily. Will increase Lopressor to 37.5 mg bid and restart oral Amiodarone as not on this am's MAR 2.  Pulmonary - On room air. PA/LAT CXR this am appears to show cardiomegaly, small bilateral pleural effusions. Encourage incentive spirometer 3. Volume Overload - Will give Lasix 40 mg daily 4.  Expected post op acute blood loss anemia - Await H and H 5. DM-CBGs 238/222/81. On Metformin 1000 mg bid. Pre op HGA1C 7.4 6. ID-on Keflex. He does have minor serous drainage from lower sternal wound;may be from fat necrosis. 7. Remove EPW  Donielle M ZimmermanPA-C 08/27/2020,7:33 AM Patient seen and examined, agree with above.   Salvatore Decent Dorris Fetch, MD [facility] [phone]

## 2020-08-27 NOTE — Progress Notes (Signed)
CARDIAC REHAB PHASE I   PRE:  Rate/Rhythm: 97 SR  BP:  Supine:   Sitting: 139/83  Standing:    SaO2: 93%RA  MODE:  Ambulation: 1240 ft   POST:  Rate/Rhythm: 105 ST  BP:  Supine:   Sitting: 152/89  Standing:    SaO2: 94%RA 0829-0910 Pt walked 1240 ft on RA with rolling walker with steady gait. Tolerated well. Back to recliner after walk. C/o a little discomfort under left shoulder. Remained in NSR with walk.    Luetta Nutting, RN BSN  08/27/2020 9:07 AM

## 2020-08-27 NOTE — Progress Notes (Addendum)
Progress Note  Patient Name: Johnny Nichols Date of Encounter: 08/27/2020  Primary Cardiologist: Reatha Harps, MD  Subjective   Some chest discomfort on inspiration but feeling better day by day. DOE improving. Did feel afib last night (about 15 mins on tele).  Inpatient Medications    Scheduled Meds: . amiodarone  400 mg Oral BID  . aspirin EC  81 mg Oral Daily  . atorvastatin  80 mg Oral Daily  . cephALEXin  500 mg Oral Q6H  . clopidogrel  75 mg Oral Daily  . [facility] Education   Does not apply Once  . furosemide  40 mg Oral Daily  . insulin aspart  0-24 Units Subcutaneous TID AC & HS  . insulin detemir  35 Units Subcutaneous BID  . isosorbide mononitrate  15 mg Oral Daily  . metFORMIN  1,000 mg Oral BID WC  . metoprolol tartrate  37.5 mg Oral BID  . pantoprazole  40 mg Oral QAC breakfast  . potassium chloride  20 mEq Oral Daily  . sodium chloride flush  10-40 mL Intracatheter Q12H  . sodium chloride flush  3 mL Intravenous Q12H   Continuous Infusions: . sodium chloride     PRN Meds: sodium chloride, acetaminophen, ondansetron **OR** ondansetron (ZOFRAN) IV, senna-docusate, sodium chloride flush, sodium chloride flush, traMADol   Vital Signs    Vitals:   08/27/20 0034 08/27/20 0338 08/27/20 0500 08/27/20 0750  BP:  133/69  139/83  Pulse:  89  100  Resp: 16 18  20   Temp: 98.7 F (37.1 C) 98.6 F (37 C)  98.7 F (37.1 C)  TempSrc: Oral Oral  Oral  SpO2:  94%  93%  Weight:   101.9 kg   Height:        Intake/Output Summary (Last 24 hours) at 08/27/2020 0924 Last data filed at 08/27/2020 0600 Gross per 24 hour  Intake 680 ml  Output 1100 ml  Net -420 ml   Last 3 Weights 08/27/2020 08/26/2020 08/25/2020  Weight (lbs) 224 lb 10.4 oz 224 lb 3.3 oz 226 lb 12.8 oz  Weight (kg) 101.9 kg 101.7 kg 102.876 kg     Telemetry    Largely NSR but had recurrent PAF last night around 1:50am-2:05am - Personally  Reviewed  Physical Exam   GEN: No acute distress.  HEENT: Normocephalic, atraumatic, sclera non-icteric. Neck: No JVD or bruits. Cardiac: RRRwith 2/6 SEM RUSB, no rubs or gallops.  Radials/DP/PT 1+ and equal bilaterally.  Respiratory: Decreased BS at bases otherwise no rales, wheezes or rhonchi. Breathing is unlabored. GI: Soft, nontender, non-distended, BS +x 4. MS: s/p sternal incision with dressing, LE incisions c/d/i Extremities: No clubbing or cyanosis. Mild soft BLE edema. Distal pedal pulses are 2+ and equal bilaterally. Neuro:  AAOx3. Follows commands. Psych:  Responds to questions appropriately with a normal affect.  Labs    High Sensitivity Troponin:   Recent Labs  Lab 08/18/20 0100 08/18/20 0339 08/18/20 0636 08/18/20 1050  TROPONINIHS 1,826* 5,960* 8,667* 08/20/20*      Cardiac EnzymesNo results for input(s): TROPONINI in the last 168 hours. No results for input(s): TROPIPOC in the last 168 hours.   Chemistry Recent Labs  Lab 08/21/20 2012 08/22/20 0508 08/24/20 0514 08/25/20 0446 08/26/20 0217 08/27/20 0639  NA 128*   < > 130* 135 135 139  K 3.8   < > 4.1 3.6 3.6 4.1  CL 91*   < > 98 99 99 101  CO2 25   < > 24 28 25 22   GLUCOSE 229*   < > 223* 181* 99 78  BUN 15   < > 8 8 10 11   CREATININE 0.97   < > 0.92 0.79 0.83 0.95  CALCIUM 9.3   < > 8.3* 8.4* 8.6* 9.6  PROT 7.5  --  5.8*  --   --   --   ALBUMIN 3.7  --  2.9*  --   --   --   AST 24  --  22  --   --   --   ALT 29  --  19  --   --   --   ALKPHOS 65  --  46  --   --   --   BILITOT 1.0  --  1.2  --   --   --   GFRNONAA >60   < > >60 >60 >60 >60  ANIONGAP 12   < > 8 8 11  16*   < > = values in this interval not displayed.     Hematology Recent Labs  Lab 08/24/20 0514 08/25/20 0446 08/26/20 0217  WBC 17.2* 11.5* 11.7*  RBC 3.80* 4.40 3.88*  HGB 10.8* 12.9* 11.3*  HCT 33.1* 37.2* 32.8*  MCV 87.1 84.5 84.5  MCH 28.4 29.3 29.1  MCHC 32.6 34.7 34.5  RDW 13.2 13.2 13.2  PLT 149* 148* 255     BNPNo results for input(s): BNP, PROBNP in the last 168 hours.   DDimer No results for input(s): DDIMER in the last 168 hours.   Radiology    DG Chest 2 View  Result Date: 08/27/2020 CLINICAL DATA:  History: History of CABG. Additional history provided by technologist: Postop CABG, cough. Provided EXAM: CHEST - 2 VIEW COMPARISON:  Prior chest radiographs 08/25/2020 and earlier FINDINGS: Shallow inspiration radiograph. Prior median sternotomy/CABG. Stable cardiopericardial silhouette. Ill-defined opacities within the perihilar lungs and lung bases, similar to the prior exam. Suspected trace bilateral pleural effusions. No appreciable pneumothorax. No evidence of acute bony abnormality. IMPRESSION: Shallow inspiration radiograph. No significant interval change as compared to the chest radiograph of 08/25/2020. Ill-defined opacities within the perihilar lungs and lung bases, which may reflect atelectasis or pneumonia. Suspected trace bilateral pleural effusions. Electronically Signed   By: Jackey LogeKyle  Golden DO   On: 08/27/2020 07:59    Cardiac Studies   Intra-op TEE 08/22/20 POST-OP IMPRESSIONS  - Left Ventricle: The left ventricle is unchanged from pre-bypass.  - Aorta: The aorta appears unchanged from pre-bypass.  - Left Atrial Appendage: A Atricure 40mm clip was placed.  - Aortic Valve: The aortic valve appears unchanged from pre-bypass.  - Mitral Valve: The mitral valve appears unchanged from pre-bypass.  - Tricuspid Valve: The tricuspid valve appears unchanged from pre-bypass.  - Interatrial Septum: The interatrial septum appears unchanged from  pre-bypass.  - Pericardium: The pericardium appears unchanged from pre-bypass.  S/P CABG X 4. Emergence on low dose phenylephrine, dopamine, milrinone gtt  which  were titrated off appropriately with only phenylephrine at time of ICU  arrival.  No new or worsening wall motion or valvular abnormalities. Post procedure  LV  hyperdynamic with  preserved global function. Known moderate AS unchanged  from  pre-procedure.  PRE-OP FINDINGS  Left Ventricle: The left ventricle has mildly reduced systolic function,  with an ejection fraction of 45-50%. The cavity size was normal. There is  mild concentric left ventricular hypertrophy. Left ventrical global  hypokinesis without regional wall  motion abnormalities. No evidence of left ventricular regional wall motion  abnormalities. The average left ventricular global longitudinal strain is  -8.2 %.  Right Ventricle: The right ventricle has normal systolic function. The  cavity was normal. There is no increase in right ventricular wall  thickness. Right ventricular systolic pressure is normal.   Left Atrium: Left atrial size was normal in size. The left atrial  appendage is well visualized and there is no evidence of thrombus present.  Left atrial appendage velocity is normal at greater than 40 cm/s.   Right Atrium: Right atrial size was normal in size.   Interatrial Septum: No atrial level shunt detected by color flow Doppler.  The interatrial septum appears to be lipomatous.   Pericardium: There is no evidence of pericardial effusion. There is no  pleural effusion.   Mitral Valve: The mitral valve is degenerative in appearance. Moderate  thickening of the mitral valve leaflet. Moderate calcification of the  mitral valve leaflet. Mitral valve regurgitation is mild to moderate by  color flow Doppler. The MR jet is  centrally-directed. There is no evidence of mitral valve vegetation.  Pulmonary venous flow is normal. There is moderate mitral valve  regurgitation by PISA measuring 1.01. There is No evidence of mitral  stenosis.   Tricuspid Valve: The tricuspid valve was normal in structure. Tricuspid  valve regurgitation was not visualized by color flow Doppler. There is no  evidence of tricuspid valve vegetation.   Aortic Valve: The aortic valve is tricuspid There is  moderate thickening  of the aortic valve and There is moderate calcification of the aortic  valve Aortic valve regurgitation is trivial by color flow Doppler. There  is moderate stenosis of the aortic  valve, with a calculated valve area of 1.33 cm. There is no evidence of a  vegetation on the aortic valve.   Pulmonic Valve: The pulmonic valve was normal in structure No evidence of  pumonic stenosis.  Pulmonic valve regurgitation is not visualized by color flow Doppler.    Aorta: The aortic root and ascending aorta are normal in size and  structure.   Pulmonary Artery: Theone Murdoch catheter present on the right. The pulmonary  artery is of normal size.   Venous: The inferior vena cava is normal in size with greater than 50%  respiratory variability, suggesting right atrial pressure of 3 mmHg.   Shunts: No residual ventricular septal defect is detected by 2D or color  Doppler.   2D echo 08/18/20 1. Left ventricular ejection fraction, by estimation, is 45 to 50%. The  left ventricle has mildly decreased function. The basal-to-mid lateral  wall appears moderately hypokinetic based on limited views. The rest of  the LV segments appear mildly  hypokinetic.There is mild concentric left ventricular hypertrophy. Left  ventricular diastolic parameters are consistent with Grade II diastolic  dysfunction (pseudonormalization).  2. Right ventricular systolic function is normal. The right ventricular  size is normal.  3. The mitral valve is degenerative. Mild to moderate mitral valve  regurgitation. Moderate mitral annular calcification.  4. The aortic valve is tricuspid. There is moderate calcification of the  aortic valve. There is moderate thickening of the aortic valve. Aortic  valve regurgitation is not visualized. There is moderate aortic stenosis  with AVA 1.0cm2, mean gradient  , peak velocity 2.5m/s, DOI 0.34. LVOT VTI 18.  5. The inferior vena cava is normal in size  with greater than 50%  respiratory variability, suggesting right atrial pressure of 3 mmHg  Cardiac Cath 08/18/20  Dist LM to Ost LAD lesion is 80% stenosed.  Mid Cx lesion is 80% stenosed.  Ost Cx lesion is 75% stenosed.  Prox RCA lesion is 80% stenosed.  LV end diastolic pressure is moderately elevated. LVEDP 24 mm Hg.  There is no aortic valve stenosis.  Tortuosity of the right subclavian artery noted.   Severe left main disease.  Plan for surgical consult.    IV Lasix for increased LVEDP.   Aggressive secondary prevention.   Patient Profile     54 y.o. male with diabetes, hypertension, OSA who was admitted on 08/18/2020 with A. fib with RVR andnon-STEMI. 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  LIMA to LAD, left radial artery to diagonal, vein graft to circumflex, vein graft to PDA.  Assessment & Plan    1. NSTEMI/3V CAD - s/p CABG x4 on 08/22/2020 with LIMA to LAD, left radial artery to diagonal, vein graft to circumflex, vein graft to PDA - on ASA, Plavix (given NSTEMI), metoprolol 37.5mg  BID, atorvastatin 80mg  daily, Imdur 15mg  (radial graft), Lasix 40mg  + KCl daily - also on Keflex for serous drainage from lower sternal wound  2. Atrial fib with RVR s/p LAA clipping - felt ischemic in nature, possibly secondary to NSTEMI, s/p LAA clipping - CHADSVASC 3 however per prior cardiology notes, given that he has had left atrial appendage clipping, Dr. felt he could forgo long term OAC - continue to monitor clinically in OP setting - on amiodarone currently, await MD recs regarding DC dosing - metoprolol increased to 37.5mg  BID this AM  3. Ischemic cardiomyopathy with acute systolic CHF this admission  - LVEDP moderately elevated 08/18/20 s/p IV Lasix - started on oral Lasix/KCl today - consider addition of ARB either at discharge or as OP if BP remains steady - discussed daily weights with pt  4. Moderate aortic stenosis - AVR deferred,  follow clinically as OP - will need serial echocardiograms in the future  5. HTN - BP OK  6. HLD goal LDL <70 - LDL 110, statin titrated  (AST/ALT wnl this admission) - if the patient is tolerating statin at time of follow-up appointment, would consider rechecking liver function/lipid panel in 6  Tentatively arranged TOC f/u 09/08/19.  For questions or updates, please contact [facility] Please consult [email or web address] for contact info under Cardiology/STEMI.  Signed, Flora Lipps, PA-C 08/27/2020, 9:24 AM    History and all data above reviewed.  Patient examined.  I agree with the findings as above.  The patient exam reveals COR:RRR, systolic murmur early peaking  ,  Lungs: Decreased breath sounds at both bases.    ,  Abd: Positive bowel sounds, no rebound no guarding, Ext No edema  .  All available labs, radiology testing, previous records reviewed. Agree with documented assessment and plan.   Atrial fib:  PAF.  Discussed with patient.  I would send home one one week of 400 mg bid amio and then 400 daily until seen in follow up.  Agree that given ASA/Plavix and clipping that DOAC is not needed at this point.   11/06/19 Trayvion Embleton  10:20 AM  08/27/2020

## 2020-08-27 NOTE — Progress Notes (Signed)
Mobility Specialist - Progress Note   08/27/20 1134  Mobility  Activity Ambulated in hall  Level of Assistance Modified independent, requires aide device or extra time  Assistive Device Front wheel walker  Distance Ambulated (ft) 1500 ft  Mobility Response Tolerated well  Mobility performed by Mobility specialist  $Mobility charge 1 Mobility    Pre-mobility: 93 HR During mobility: 99 HR Post-mobility: 94 HR  Asx throughout ambulation. To recliner after walk to eat lunch.   Mamie Levers Mobility Specialist Mobility Specialist Phone: [phone]

## 2020-08-27 NOTE — Telephone Encounter (Signed)
Patient currently admitted

## 2020-08-28 ENCOUNTER — Other Ambulatory Visit: Payer: Self-pay | Admitting: Physician Assistant

## 2020-08-28 LAB — BASIC METABOLIC PANEL
Anion gap: 9 (ref 5–15)
BUN: 12 mg/dL (ref 6–20)
CO2: 24 mmol/L (ref 22–32)
Calcium: 9.3 mg/dL (ref 8.9–10.3)
Chloride: 102 mmol/L (ref 98–111)
Creatinine, Ser: 1.03 mg/dL (ref 0.61–1.24)
GFR, Estimated: >60 mL/min (ref >60)
Glucose, Bld: 145 mg/dL — ABNORMAL HIGH (ref 70–99)
Potassium: 4.2 mmol/L (ref 3.5–5.1)
Sodium: 135 mmol/L (ref 135–145)

## 2020-08-28 LAB — GLUCOSE, CAPILLARY
Glucose-Capillary: 80 mg/dL (ref 70–99)
Glucose-Capillary: 132 mg/dL — ABNORMAL HIGH (ref 70–99)

## 2020-08-28 MED ORDER — CLOPIDOGREL BISULFATE 75 MG PO TABS: 75.0000 mg | ORAL_TABLET | Freq: Every day | ORAL | 1 refills | Status: DC

## 2020-08-28 MED ORDER — METOPROLOL TARTRATE 25 MG PO TABS: 37.5000 mg | ORAL_TABLET | Freq: Two times a day (BID) | ORAL | 1 refills | Status: DC

## 2020-08-28 MED ORDER — METOPROLOL TARTRATE 50 MG PO TABS: 50.0000 mg | ORAL_TABLET | Freq: Two times a day (BID) | ORAL | 1 refills | Status: DC

## 2020-08-28 MED ORDER — CEPHALEXIN 500 MG PO CAPS: 500.0000 mg | ORAL_CAPSULE | Freq: Four times a day (QID) | ORAL | 0 refills | Status: AC

## 2020-08-28 MED ORDER — ENTRESTO 24-26 MG PO TABS
1.0000 | ORAL_TABLET | Freq: Two times a day (BID) | ORAL | 1 refills | Status: DC
Start: 2020-08-28 — End: 2021-01-30

## 2020-08-28 MED ORDER — AMIODARONE HCL 200 MG PO TABS: ORAL_TABLET | ORAL | 1 refills | Status: DC

## 2020-08-28 MED ORDER — ATORVASTATIN CALCIUM 80 MG PO TABS: 80.0000 mg | ORAL_TABLET | Freq: Every day | ORAL | 1 refills | Status: DC

## 2020-08-28 MED ORDER — ISOSORBIDE MONONITRATE ER 30 MG PO TB24: 15.0000 mg | ORAL_TABLET | Freq: Every day | ORAL | 0 refills | Status: DC

## 2020-08-28 MED ORDER — ACETAMINOPHEN 325 MG PO TABS: 650.0000 mg | ORAL_TABLET | Freq: Four times a day (QID) | ORAL | Status: AC | PRN

## 2020-08-28 MED ORDER — TRAMADOL HCL 50 MG PO TABS: 50.0000 mg | ORAL_TABLET | Freq: Four times a day (QID) | ORAL | 0 refills | Status: DC | PRN

## 2020-08-28 MED FILL — ISOSORBIDE MN ER 30 MG TAB: 30 | 30 days supply | Qty: 15 | Fill #0

## 2020-08-28 MED FILL — ATORVASTATIN CALCIUM 80 MG: 80 | 30 days supply | Qty: 30 | Fill #0

## 2020-08-28 MED FILL — ?CEPHALEXIN 500 MG CAPSULE: 500 | 5 days supply | Qty: 20 | Fill #0

## 2020-08-28 MED FILL — traMADol HCL 50 MG TABS: 50 | 7 days supply | Qty: 30 | Fill #0

## 2020-08-28 MED FILL — ?METOPROLOL TARTRATE 25MG T: 25 | 30 days supply | Qty: 90 | Fill #0

## 2020-08-28 MED FILL — ?CLOPIDOGREL 75MG TA: 75 | 30 days supply | Qty: 30 | Fill #0

## 2020-08-28 MED FILL — AMIODARONE HCL 200 MG TAB: 200 | 30 days supply | Qty: 74 | Fill #0

## 2020-08-28 MED FILL — ENTRESTO 24 MG-26 MG TABLET: 24-26 | 30 days supply | Qty: 60 | Fill #0

## 2020-08-28 MED FILL — Lidocaine HCl Local Soln Prefilled Syringe 100 MG/5ML (2%): INTRAMUSCULAR | Qty: 5 | Status: AC

## 2020-08-28 MED FILL — Sodium Chloride IV Soln 0.9%: INTRAVENOUS | Qty: 2000 | Status: AC

## 2020-08-28 MED FILL — Electrolyte-R (PH 7.4) Solution: INTRAVENOUS | Qty: 4000 | Status: AC

## 2020-08-28 MED FILL — Heparin Sodium (Porcine) Inj 1000 Unit/ML: INTRAMUSCULAR | Qty: 30 | Status: AC

## 2020-08-28 MED FILL — Sodium Bicarbonate IV Soln 8.4%: INTRAVENOUS | Qty: 50 | Status: AC

## 2020-08-28 MED FILL — Mannitol IV Soln 20%: INTRAVENOUS | Qty: 500 | Status: AC

## 2020-08-28 NOTE — Discharge Instructions (Signed)
Discharge Instructions:  1. You may shower, please wash incisions daily with soap and water and keep dry.  If you wish to cover wounds with dressing you may do so but please keep clean and change daily.  No tub baths or swimming until incisions have completely healed.  If your incisions become red or develop any drainage please call our office at [phone]  2. No Driving until cleared by Dr. Dennie Maizes office and you are no longer using narcotic pain medications  3. Monitor your weight daily.. Please use the same scale and weigh at same time... If you gain 3-5 lbs in 48 hours with associated lower extremity swelling, please contact our office at [phone]  4. Fever of 101.5 for at least 24 hours with no source, please contact our office at [phone]  5. Activity- up as tolerated, please walk at least 3 times per day.  Avoid strenuous activity, no lifting, pushing, or pulling with your arms over 8-10 lbs for a minimum of 6 weeks  6. If any questions or concerns arise, please do not hesitate to contact our office at [phone]

## 2020-08-28 NOTE — Telephone Encounter (Signed)
Pt still currently admitted.

## 2020-08-28 NOTE — Progress Notes (Signed)
Pt received discharge instructions and does not have any additional questions or concerns at his time. Vss are stable. Pt is ready for discharge.

## 2020-08-28 NOTE — Progress Notes (Signed)
Chest tube sutures removed with bilateral incisions clean and dry. Sternal chest incision cleaned with betadine with incisions clean, dry and intact. Patient tolerated well.   Kenard Gower, RN

## 2020-08-28 NOTE — Progress Notes (Signed)
[id number] Education completed with pt who voiced understanding. Stressed importance of sternal precautions and staying in the tube. Reviewed walking for ex, gave heart healthy and diabetic diets, and encouraged IS. Discussed CRP 2 and referral to [place] made. Interested in virtual if he does not get insurance cover in future. Pt is interested in participating in Virtual Cardiac and Pulmonary Rehab. Pt advised that Virtual Cardiac and Pulmonary Rehab is provided at no cost to the patient.  Checklist:  1. Pt has smart device  ie smartphone and/or ipad for downloading an app  Yes 2. Reliable internet/wifi service    Yes 3. Understands how to use their smartphone and navigate within an app.  Yes   Pt verbalized understanding and is in agreement.

## 2020-08-28 NOTE — Progress Notes (Signed)
Plan of care reviewed. Pt's been progressing. He's hemodynamically stable. Remained afebrile. NSR on monitor, HR 80s. Ambulated 540 feet in hallway with well tolerated, denied shortness of breath. Sternal dressing changed and cleaned with Betadine and dry dressing applied. Scant serous drainage presented on lower mid sternal incision. Pain well controlled. No acute distress. We continue to monitor.  Filiberto Pinks, RN

## 2020-08-28 NOTE — Progress Notes (Addendum)
      [address]       [place] [address]             [phone]      6 Days Post-Op Procedure(s) (LRB): CORONARY ARTERY BYPASS GRAFTING (CABG) TIMES FOUR, USING LEFT INTERNAL MAMMARY ARTERY, LEFT RADIAL ARTERY, AND RIGHT LEG GREATER SAPHENOUS VEIN HARVESTED ENDOSCOPICALLY (N/A) CLIPPING OF ATRIAL APPENDAGE USING ATRICURE ATRICLIP (N/A) TRANSESOPHAGEAL ECHOCARDIOGRAM (TEE) (N/A) LEFT RADIAL ARTERY HARVEST (Left) ENDOVEIN HARVEST OF GREATER SAPHENOUS VEIN (Right) Subjective: Doing well today. No complaints.   Objective: Vital signs in last 24 hours: Temp:  [98 F (36.7 C)-98.7 F (37.1 C)] 98.1 F (36.7 C) (12/28 0417) Pulse Rate:  [71-100] 83 (12/28 0417) Cardiac Rhythm: Normal sinus rhythm (12/28 0340) Resp:  [18-25] 22 (12/28 0417) BP: (124-153)/(76-89) 139/84 (12/28 0417) SpO2:  [93 %-100 %] 95 % (12/28 0417) Weight:  [100.7 kg] 100.7 kg (12/28 0500)     Intake/Output from previous day: 12/27 0701 - 12/28 0700 In: 1250 [P.O.:1250] Out: 1735 [Urine:1735] Intake/Output this shift: No intake/output data recorded.  General appearance: alert, cooperative and no distress Heart: regular rate and rhythm, S1, S2 normal, no murmur, click, rub or gallop Lungs: clear to auscultation bilaterally Abdomen: soft, non-tender; bowel sounds normal; no masses,  no organomegaly Extremities: extremities normal, atraumatic, no cyanosis or edema Wound: clean and dry with small amt of drainage on distal sternum  Lab Results: Recent Labs    08/26/20 0217 08/27/20 0935  WBC 11.7* 10.6*  HGB 11.3* 12.1*  HCT 32.8* 35.5*  PLT 255 330   BMET:  Recent Labs    08/26/20 0217 08/27/20 0639  NA 135 139  K 3.6 4.1  CL 99 101  CO2 25 22  GLUCOSE 99 78  BUN 10 11  CREATININE 0.83 0.95  CALCIUM 8.6* 9.6    PT/INR: No results for input(s): LABPROT, INR in the last 72 hours. ABG    Component Value Date/Time   PHART 7.411 08/22/2020 2335   HCO3 21.6  08/22/2020 2335   TCO2 23 08/22/2020 2335   ACIDBASEDEF 2.0 08/22/2020 2335   O2SAT 66.0 08/25/2020 0516   CBG (last 3)  Recent Labs    08/27/20 1621 08/27/20 2057 08/28/20 0615  GLUCAP 192* 237* 80    Assessment/Plan: S/P Procedure(s) (LRB): CORONARY ARTERY BYPASS GRAFTING (CABG) TIMES FOUR, USING LEFT INTERNAL MAMMARY ARTERY, LEFT RADIAL ARTERY, AND RIGHT LEG GREATER SAPHENOUS VEIN HARVESTED ENDOSCOPICALLY (N/A) CLIPPING OF ATRIAL APPENDAGE USING ATRICURE ATRICLIP (N/A) TRANSESOPHAGEAL ECHOCARDIOGRAM (TEE) (N/A) LEFT RADIAL ARTERY HARVEST (Left) ENDOVEIN HARVEST OF GREATER SAPHENOUS VEIN (Right)  1. CV-BP well controlled. HR remains in the 90s. No afib recently. Continue statin, BB, statin and asa.  2. Pulm-tolerating room air with good oxygen saturation 3. Renal-creatinine 0.95, potassium 4.1 4. H and H 12.1/35.5 5. Endo-blood glucose mostly controlled. 6. On keflex for sternal drainage. Will plan for 5 days.  Plan: Will plan on doing Amio 400mg  BID x 7 days then 400mg  daily thereafter. Plan for discharge today.    LOS: 10 days    08/28/2020

## 2020-08-28 NOTE — Progress Notes (Addendum)
Progress Note  Patient Name: Johnny Nichols Date of Encounter: 08/28/2020  Primary Cardiologist: Reatha HarpsWesley T O'Neal, MD  Subjective   Feeling well today without complaints. No CP or SOB.  Inpatient Medications    Scheduled Meds: . amiodarone  400 mg Oral BID  . aspirin EC  81 mg Oral Daily  . atorvastatin  80 mg Oral Daily  . cephALEXin  500 mg Oral Q6H  . clopidogrel  75 mg Oral Daily  . [facility]   Does not apply Once  . furosemide  40 mg Oral Daily  . insulin aspart  0-24 Units Subcutaneous TID AC & HS  . insulin detemir  35 Units Subcutaneous BID  . isosorbide mononitrate  15 mg Oral Daily  . metFORMIN  1,000 mg Oral BID WC  . metoprolol tartrate  37.5 mg Oral BID  . pantoprazole  40 mg Oral QAC breakfast  . potassium chloride  20 mEq Oral Daily  . sodium chloride flush  10-40 mL Intracatheter Q12H  . sodium chloride flush  3 mL Intravenous Q12H   Continuous Infusions: . sodium chloride     PRN Meds: sodium chloride, acetaminophen, ondansetron **OR** ondansetron (ZOFRAN) IV, senna-docusate, sodium chloride flush, sodium chloride flush, traMADol   Vital Signs    Vitals:   08/27/20 2314 08/28/20 0417 08/28/20 0500 08/28/20 0810  BP: 135/83 139/84  140/80  Pulse: 71 83  93  Resp: (!) 21 (!) 22  20  Temp: 98.6 F (37 C) 98.1 F (36.7 C)  97.9 F (36.6 C)  TempSrc: Oral Oral  Oral  SpO2: 100% 95%  95%  Weight:   100.7 kg   Height:        Intake/Output Summary (Last 24 hours) at 08/28/2020 0842 Last data filed at 08/28/2020 0811 Gross per 24 hour  Intake 1250 ml  Output 1935 ml  Net -685 ml   Last 3 Weights 08/28/2020 08/27/2020 08/26/2020  Weight (lbs) 222 lb 0.1 oz 224 lb 10.4 oz 224 lb 3.3 oz  Weight (kg) 100.7 kg 101.9 kg 101.7 kg     Telemetry    NSR - Personally Reviewed  Physical Exam   GEN: No acute distress.  HEENT: Normocephalic, atraumatic, sclera non-icteric. Neck: No JVD or  bruits. Cardiac: RRR with 2/6 SEM RUSB, no rubs or gallops.  Radials/DP/PT 1+ and equal bilaterally.  Respiratory: Diminished BS at bases, L>R. No wheezes, rales, or rhonchi. Breathing is unlabored. GI: Soft, nontender, non-distended, BS +x 4. MS: sternal wound c/d/i Extremities: No clubbing or cyanosis. Trace soft BLE edema. Distal pedal pulses are 2+ and equal bilaterally. Neuro:  AAOx3. Follows commands. Psych:  Responds to questions appropriately with a normal affect.  Labs    High Sensitivity Troponin:   Recent Labs  Lab 08/18/20 0100 08/18/20 0339 08/18/20 0636 08/18/20 1050  TROPONINIHS 1,826* 5,960* 8,667* 16,10916,847*      Cardiac EnzymesNo results for input(s): TROPONINI in the last 168 hours. No results for input(s): TROPIPOC in the last 168 hours.   Chemistry Recent Labs  Lab 08/21/20 2012 08/22/20 0508 08/24/20 0514 08/25/20 0446 08/26/20 0217 08/27/20 0639  NA 128*   < > 130* 135 135 139  K 3.8   < > 4.1 3.6 3.6 4.1  CL 91*   < > 98 99 99 101  CO2 25   < > 24 28 25 22   GLUCOSE 229*   < > 223* 181* 99 78  BUN 15   < >  8 8 10 11   CREATININE 0.97   < > 0.92 0.79 0.83 0.95  CALCIUM 9.3   < > 8.3* 8.4* 8.6* 9.6  PROT 7.5  --  5.8*  --   --   --   ALBUMIN 3.7  --  2.9*  --   --   --   AST 24  --  22  --   --   --   ALT 29  --  19  --   --   --   ALKPHOS 65  --  46  --   --   --   BILITOT 1.0  --  1.2  --   --   --   GFRNONAA >60   < > >60 >60 >60 >60  ANIONGAP 12   < > 8 8 11  16*   < > = values in this interval not displayed.     Hematology Recent Labs  Lab 08/25/20 0446 08/26/20 0217 08/27/20 0935  WBC 11.5* 11.7* 10.6*  RBC 4.40 3.88* 4.13*  HGB 12.9* 11.3* 12.1*  HCT 37.2* 32.8* 35.5*  MCV 84.5 84.5 86.0  MCH 29.3 29.1 29.3  MCHC 34.7 34.5 34.1  RDW 13.2 13.2 13.2  PLT 148* 255 330    BNPNo results for input(s): BNP, PROBNP in the last 168 hours.   DDimer No results for input(s): DDIMER in the last 168 hours.   Radiology    DG Chest 2  View  Result Date: 08/27/2020 CLINICAL DATA:  History: History of CABG. Additional history provided by technologist: Postop CABG, cough. Provided EXAM: CHEST - 2 VIEW COMPARISON:  Prior chest radiographs 08/25/2020 and earlier FINDINGS: Shallow inspiration radiograph. Prior median sternotomy/CABG. Stable cardiopericardial silhouette. Ill-defined opacities within the perihilar lungs and lung bases, similar to the prior exam. Suspected trace bilateral pleural effusions. No appreciable pneumothorax. No evidence of acute bony abnormality. IMPRESSION: Shallow inspiration radiograph. No significant interval change as compared to the chest radiograph of 08/25/2020. Ill-defined opacities within the perihilar lungs and lung bases, which may reflect atelectasis or pneumonia. Suspected trace bilateral pleural effusions. Electronically Signed   By: 08/27/2020 DO   On: 08/27/2020 07:59    Cardiac Studies    Intra-op TEE 08/22/20 POST-OP IMPRESSIONS  - Left Ventricle: The left ventricle is unchanged from pre-bypass.  - Aorta: The aorta appears unchanged from pre-bypass.  - Left Atrial Appendage: A Atricure 82mm clip was placed.  - Aortic Valve: The aortic valve appears unchanged from pre-bypass.  - Mitral Valve: The mitral valve appears unchanged from pre-bypass.  - Tricuspid Valve: The tricuspid valve appears unchanged from pre-bypass.  - Interatrial Septum: The interatrial septum appears unchanged from  pre-bypass.  - Pericardium: The pericardium appears unchanged from pre-bypass.  S/P CABG X 4. Emergence on low dose phenylephrine, dopamine, milrinone gtt  which  were titrated off appropriately with only phenylephrine at time of ICU  arrival.  No new or worsening wall motion or valvular abnormalities. Post procedure  LV  hyperdynamic with preserved global function. Known moderate AS unchanged  from  pre-procedure.  PRE-OP FINDINGS  Left Ventricle: The left ventricle has mildly reduced systolic  function,  with an ejection fraction of 45-50%. The cavity size was normal. There is  mild concentric left ventricular hypertrophy. Left ventrical global  hypokinesis without regional wall  motion abnormalities. No evidence of left ventricular regional wall motion  abnormalities. The average left ventricular global longitudinal strain is  -8.2 %.  Right Ventricle: The  right ventricle has normal systolic function. The  cavity was normal. There is no increase in right ventricular wall  thickness. Right ventricular systolic pressure is normal.   Left Atrium: Left atrial size was normal in size. The left atrial  appendage is well visualized and there is no evidence of thrombus present.  Left atrial appendage velocity is normal at greater than 40 cm/s.   Right Atrium: Right atrial size was normal in size.   Interatrial Septum: No atrial level shunt detected by color flow Doppler.  The interatrial septum appears to be lipomatous.   Pericardium: There is no evidence of pericardial effusion. There is no  pleural effusion.   Mitral Valve: The mitral valve is degenerative in appearance. Moderate  thickening of the mitral valve leaflet. Moderate calcification of the  mitral valve leaflet. Mitral valve regurgitation is mild to moderate by  color flow Doppler. The MR jet is  centrally-directed. There is no evidence of mitral valve vegetation.  Pulmonary venous flow is normal. There is moderate mitral valve  regurgitation by PISA measuring 1.01. There is No evidence of mitral  stenosis.   Tricuspid Valve: The tricuspid valve was normal in structure. Tricuspid  valve regurgitation was not visualized by color flow Doppler. There is no  evidence of tricuspid valve vegetation.   Aortic Valve: The aortic valve is tricuspid There is moderate thickening  of the aortic valve and There is moderate calcification of the aortic  valve Aortic valve regurgitation is trivial by color flow Doppler. There   is moderate stenosis of the aortic  valve, with a calculated valve area of 1.33 cm. There is no evidence of a  vegetation on the aortic valve.   Pulmonic Valve: The pulmonic valve was normal in structure No evidence of  pumonic stenosis.  Pulmonic valve regurgitation is not visualized by color flow Doppler.    Aorta: The aortic root and ascending aorta are normal in size and  structure.   Pulmonary Artery: Theone Murdoch catheter present on the right. The pulmonary  artery is of normal size.   Venous: The inferior vena cava is normal in size with greater than 50%  respiratory variability, suggesting right atrial pressure of 3 mmHg.   Shunts: No residual ventricular septal defect is detected by 2D or color  Doppler.   2D echo 08/18/20 1. Left ventricular ejection fraction, by estimation, is 45 to 50%. The  left ventricle has mildly decreased function. The basal-to-mid lateral  wall appears moderately hypokinetic based on limited views. The rest of  the LV segments appear mildly  hypokinetic.There is mild concentric left ventricular hypertrophy. Left  ventricular diastolic parameters are consistent with Grade II diastolic  dysfunction (pseudonormalization).  2. Right ventricular systolic function is normal. The right ventricular  size is normal.  3. The mitral valve is degenerative. Mild to moderate mitral valve  regurgitation. Moderate mitral annular calcification.  4. The aortic valve is tricuspid. There is moderate calcification of the  aortic valve. There is moderate thickening of the aortic valve. Aortic  valve regurgitation is not visualized. There is moderate aortic stenosis  with AVA 1.0cm2, mean gradient  , peak velocity 2.33m/s, DOI 0.34. LVOT VTI 18.  5. The inferior vena cava is normal in size with greater than 50%  respiratory variability, suggesting right atrial pressure of 3 mmHg  Cardiac Cath 08/18/20  Dist LM to Ost LAD lesion is 80%  stenosed.  Mid Cx lesion is 80% stenosed.  Ost Cx lesion is 75%  stenosed.  Prox RCA lesion is 80% stenosed.  LV end diastolic pressure is moderately elevated. LVEDP 24 mm Hg.  There is no aortic valve stenosis.  Tortuosity of the right subclavian artery noted.  Severe left main disease. Plan for surgical consult.   IV Lasix for increased LVEDP.   Aggressive secondary prevention.  Patient Profile     54 y.o. male with diabetes, hypertension, OSA who was admitted on 08/18/2020 with A. fib with RVR andnon-STEMI. 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  Assessment & Plan    1. NSTEMI/3V CAD - s/p CABG x4 on 08/22/2020 withLIMA to LAD, left radial artery to diagonal, vein graft to circumflex, vein graft to PDA - on ASA, Plavix (given NSTEMI), metoprolol 37.5mg  BID, atorvastatin 80mg  daily, Imdur 15mg  (radial graft), Lasix 40mg  + KCl daily - also on Keflex for serous drainage from lower sternal wound - progressing well  2. Atrial fib with RVR s/p LAA clipping - felt ischemic in nature, possibly secondary to NSTEMI, s/p LAA clipping - CHADSVASC 3 however per prior cardiology notes, given that he has had left atrial appendage clipping, Dr. felt he could forgo long term OAC - continue to monitor clinically in OP setting - TSH wnl - metoprolol increased to 37.5mg  bid - can perhaps go up further to 50mg  BID to simplify regimen - Dr. recommends sending home with 400mg  BID x 1 week then 400mg  daily until seen in follow-up - FYI to discharging APP, would try to rx 200mg  tablets as the 400mg  tablets are often not carried by pharmacies  3. Ischemic cardiomyopathy with acute systolic CHF this admission  - LVEDP moderately elevated 08/18/20 s/p IV Lasix - started on oral Lasix/KCl 08/27/20 and tolerating well - would get BMET this AM and consider readdition of  ACEi/ARB given uptrending BP if Cr is stable (vs titrate BB as above - was on lisinopril as OP) - discussed daily weights with pt  4. Moderate aortic stenosis - AVR deferred, follow clinically as OP - will need serial echocardiograms in the future  5. HTN - BP trending up, see above  6. HLD goal LDL <70 - LDL 110, statin titrated  (AST/ALT wnl this admission) - if the patient is tolerating statin at time of follow-up appointment, would consider rechecking liver function/lipid panel in 6 weeks  TOC f/u arranged 09/08/19, appt placed on AVS. Anticipate sign-off note today from MD.  For questions or updates, please contact [facility] Please consult [email or web address] for contact info under Cardiology/STEMI.  Signed, Antoine Poche, PA-C 08/28/2020, 8:42 AM     History and all data above reviewed.  Patient examined.  I agree with the findings as above.  Some chest soreness.   The patient exam reveals COR:RRR, systolic murmur unchanged  ,  Lungs: Decreased breath sounds at the bases  ,  Abd: Positive bowel sounds, no rebound no guarding, Ext Trace right greater than left leg edema  .  All available labs, radiology testing, previous records reviewed. Agree with documented assessment and plan.     CARDIOLOGY RECOMMENDATIONS:  Discharge is anticipated in the next 48 hours. Recommendations for medications and follow up:  Discharge Medications: Continue medications as they are currently listed in the [facility]. Exceptions to the above:  He has an indication for empagliflozin but will defer until we see his A1c repeat after discharge.  I would suggest continuing current dose of beta blocker and adding Entresto 24/26 bid at discharge.   Follow Up: The patient's Primary Cardiologist is Reatha Harps, MD   Follow up in the office as above.  Signed,  Rollene Rotunda, MD  9:55 AM 08/28/2020  [facility]

## 2020-08-29 ENCOUNTER — Telehealth: Payer: Self-pay

## 2020-08-29 MED FILL — ?GLIPIZIDE 5MG TABLET: 5 | 30 days supply | Qty: 60 | Fill #0

## 2020-08-29 MED FILL — METFORMIN HCL 1000 MG TABS: 1000 | 30 days supply | Qty: 60 | Fill #0

## 2020-08-29 NOTE — Telephone Encounter (Signed)
Patient contacted regarding discharge from [facility] on 12/28.  Patient understands to follow up with provider Edd Fabian, NP on 09/07/2020 at 11:15 AM at [facility]. Patient understands discharge instructions? Yes Patient understands medications and regiment? Yes Patient understands to bring all medications to this visit? Yes

## 2020-08-29 NOTE — Telephone Encounter (Signed)
Transition Care Management Follow-up Telephone Call  Date of discharge and from where: 08/28/2020, [facility]   How have you been since you were released from the hospital? He stated he is doing well.   Any questions or concerns? No, not at this time.  He said he is monitoring a small open area, about the size of a dime,  on his chest incision, that is draining clear fluid.  He said this is not new, it was monitored in the hospital and he keeps a dressing on it.  Reviewed signs of infection and when to notify the surgeon and he said he understood.  He also reported his temperature today: 98.2 and BP: 137/86.   Items Reviewed:  Did the pt receive and understand the discharge instructions provided? Yes   Medications obtained and verified? Yes he said that he has all medications except he needs to pick up refills of metformin and glipizide. No questions about the medications at this time other than wondering if he will need to be on all of  these medications the rest of his life. He will discuss with cardiologist.   Other? No   Any new allergies since your discharge? No   Do you have support at home? Yes , he is currently staying with his sister. He usually lives alone  Home Care and Equipment/Supplies: Were home health services ordered? no If so, what is the name of the agency? n/a Has the agency set up a time to come to the patient's home?  n/a Were any new equipment or medical supplies ordered?  No What is the name of the medical supply agency? n/a Were you able to get the supplies/equipment? n/a Do you have any questions related to the use of the equipment or supplies? No, n/a  He already has a glucometer but did not check his blood sugar this morning.  Still needs to obtain a scale to monitor weight daily  Functional Questionnaire: (I = Independent and D = Dependent) ADLs: independent  Follow up appointments reviewed:   PCP Hospital f/u appt confirmed? Yes  - Dr  Laural Benes, 09/27/2020.   Specialist Hospital f/u appt confirmed? Yes  - cardiology - 09/07/2020; CTS 09/27/2020.   Are transportation arrangements needed? he is relying on his sister to drive until he is cleared for driving again.   May need to have transportation arranged  through [facility]  If their condition worsens, is the pt aware to call PCP or go to the Emergency Dept.?yes  Was the patient provided with contact information for the PCP's office or ED?  He has the phone number for [facility]  Was to pt encouraged to call back with questions or concerns?  yes

## 2020-08-29 NOTE — Telephone Encounter (Signed)
Patient also states that he does not have enough of all his medications to last him until his appointment.  Do we want to go ahead and refill meds, or have him wait until he is seen in office? Thanks!

## 2020-08-30 NOTE — Telephone Encounter (Signed)
Please refill all of his meds.  Gerri Spore T. Flora Lipps, MD [facility]  [address] [place], [place] [address] [phone]  2:27 PM

## 2020-08-30 NOTE — Telephone Encounter (Signed)
Called patient, he was able to get his medications from his pharmacy.  He has enough until he is seen next week.

## 2020-09-04 NOTE — Progress Notes (Signed)
Cardiology Clinic Note   Patient Name: Johnny Nichols Date of Encounter: 09/07/2020  Primary Care Provider:  [name], MD Primary Cardiologist:  Evalina Field, MD  Patient Profile    [name]. [name] 55 year old male presents the clinic today for follow-up evaluation after his NSTEMI and CABG times 4 on 08/22/2020.  Past Medical History    Past Medical History:  Diagnosis Date  . Diabetes mellitus without complication ([place])   . Hypertension   . Sleep apnea    Past Surgical History:  Procedure Laterality Date  . CLIPPING OF ATRIAL APPENDAGE N/A 08/22/2020   Procedure: CLIPPING OF ATRIAL APPENDAGE USING ATRICURE 40MM ATRICLIP;  Surgeon: Grace Isaac, MD;  Location: [facility];  Service: Open Heart Surgery;  Laterality: N/A;  . CORONARY ARTERY BYPASS GRAFT N/A 08/22/2020   Procedure: CORONARY ARTERY BYPASS GRAFTING (CABG) TIMES FOUR, USING LEFT INTERNAL MAMMARY ARTERY, LEFT RADIAL ARTERY, AND RIGHT LEG GREATER SAPHENOUS VEIN HARVESTED ENDOSCOPICALLY;  Surgeon: Grace Isaac, MD;  Location: [facility];  Service: Open Heart Surgery;  Laterality: N/A;  . ENDOVEIN HARVEST OF GREATER SAPHENOUS VEIN Right 08/22/2020   Procedure: ENDOVEIN HARVEST OF GREATER SAPHENOUS VEIN;  Surgeon: Grace Isaac, MD;  Location: [facility];  Service: Open Heart Surgery;  Laterality: Right;  . LEFT HEART CATH AND CORONARY ANGIOGRAPHY N/A 08/18/2020   Procedure: LEFT HEART CATH AND CORONARY ANGIOGRAPHY;  Surgeon: Jettie Booze, MD;  Location: [facility];  Service: Cardiovascular;  Laterality: N/A;  . RADIAL ARTERY HARVEST Left 08/22/2020   Procedure: LEFT RADIAL ARTERY HARVEST;  Surgeon: Grace Isaac, MD;  Location: [facility];  Service: Open Heart Surgery;  Laterality: Left;  . TEE WITHOUT CARDIOVERSION N/A 08/22/2020   Procedure: TRANSESOPHAGEAL ECHOCARDIOGRAM (TEE);  Surgeon: Grace Isaac, MD;  Location: [facility];  Service: Open Heart Surgery;  Laterality: N/A;     Allergies  No Known Allergies  History of Present Illness    [name] has a PMH of coronary artery disease status post CABG x4 on 08/22/2020, HTN, atrial fibrillation with RVR, OSA, PNA, HLD, and obesity.  He was admitted to the hospital on 08/18/2020 with A. fib RVR and NSTEMI.  He underwent cardiac catheterization was found to have three-vessel coronary artery disease.  On 08/22/2020 he underwent CABG x4 with LIMA-LAD, RIMA-diagonal, SVG-circumflex, SVG-PDA.  He also underwent left atrial appendage clipping.  It was felt that he did not need long-term [place].  TSH normal.  He was sent home with amiodarone 400 mg twice daily x1 week with instructions to then decrease to 400 mg daily.  He was also noted to have ischemic cardiomyopathy with acute systolic CHF.  His LVEDP was moderately elevated at and he was diuresed.  He was also started on 24-26 Entresto.  He progressed well and was discharged on 08/28/2020.  He presents the clinic today for follow-up evaluation states he feels well.  He continues to heal well after his open heart surgery.  He has been increasing his walking.  He reports that today he walked in the hall parking lot and the whole lobby trying to find the office.  He did request a handicap placard.  However, I informed him that I would like him to continue to walk rather than park right next to stores.  He expressed understanding.  He reports compliance with his medications.  He did have some confusion with his amiodarone initially.  I will transition him to 200 mg daily  at this time.  He has noticed some numbness around his sternal incision in his left radial incision.  He denies bleeding issues.  He has been taking his blood pressures with a wrist cuff prior to taking his blood pressure medication.  In the clinic today his blood pressure is well controlled at 122/78.  I will give him the salty 6 diet sheet, have him continue with sternal precautions and continue to increase his  physical activity, and follow-up with Dr. Audie Box in 3 months.  Today he denies chest pain, shortness of breath, lower extremity edema, increased fatigue, palpitations, melena, hematuria, hemoptysis, diaphoresis, weakness, presyncope, syncope, orthopnea, and PND.   Home Medications    Prior to Admission medications   Medication Sig Start Date End Date Taking? Authorizing Provider  acetaminophen (TYLENOL) 325 MG tablet Take 2 tablets (650 mg total) by mouth every 6 (six) hours as needed for mild pain. 08/28/20   Elgie Collard, PA-C  amiodarone (PACERONE) 200 MG tablet Continue Amio 476m (2 tabs)  BID for 7 days then, Amio 20103m(1 tab)  BID until we see you in the office. 08/28/20   CoElgie CollardPA-C  Ascorbic Acid (VITAMIN C) 1000 MG tablet Take 1,000 mg by mouth daily.    [provider]  aspirin EC 81 MG EC tablet Take 1 tablet (81 mg total) by mouth daily. Swallow whole. 08/28/20   ZiNani SkillernPA-C  atorvastatin (LIPITOR) 80 MG tablet Take 1 tablet (80 mg total) by mouth daily. 08/29/20   CoElgie CollardPA-C  Blood Glucose Monitoring Suppl (TRUE METRIX METER) w/Device KIT Use to check blood sugar as instructed. 05/14/20   StCamillia HerterNP  Cholecalciferol (VITAMIN D3 PO) Take 1 capsule by mouth daily.    [provider]  clopidogrel (PLAVIX) 75 MG tablet Take 1 tablet (75 mg total) by mouth daily. 08/29/20   CoElgie CollardPA-C  Dulaglutide (TRULICITY) 1.5 MGZO/1.0RUOPN Inject 1.5 mg into the skin once a week. Patient taking differently: Inject 1.5 mg into the skin every Sunday. 08/13/20   JoLadell PierMD  glipiZIDE (GLUCOTROL) 5 MG tablet TAKE 1 TABLET BY MOUTH 2 (TWO) TIMES DAILY BEFORE A MEAL. Patient taking differently: Take 5 mg by mouth 2 (two) times daily before a meal. 08/13/20   JoLadell PierMD  glucose blood (TRUE METRIX BLOOD GLUCOSE TEST) test strip Use as instructed to check blood sugar daily. 07/02/18   JoLadell PierMD   isosorbide mononitrate (IMDUR) 30 MG 24 hr tablet Take 0.5 tablets (15 mg total) by mouth daily. 08/29/20   CoElgie CollardPA-C  metFORMIN (GLUCOPHAGE) 1000 MG tablet Take 1 tablet (1,000 mg total) by mouth 2 (two) times daily with a meal. 08/13/20   JoLadell PierMD  metoprolol tartrate (LOPRESSOR) 25 MG tablet Take 1.5 tablets (37.5 mg total) by mouth 2 (two) times daily. 08/28/20   CoElgie CollardPA-C  sacubitril-valsartan (ENTRESTO) 24-26 MG Take 1 tablet by mouth 2 (two) times daily. 08/28/20   CoElgie CollardPA-C  traMADol (ULTRAM) 50 MG tablet Take 1 tablet (50 mg total) by mouth every 6 (six) hours as needed for moderate pain. 08/28/20   CoElgie CollardPA-C  TRUEPLUS LANCETS 28G MISC Use as instructed to check blood sugar daily. 07/02/18   JoLadell PierMD    Family History    Family History  Problem Relation Age of Onset  . Heart disease  Mother   . Cancer Father   . Hypertension Other   . Obesity Other    He indicated that his mother is alive. He indicated that his father is deceased. He indicated that the status of his other is unknown.  Social History    Social History   Socioeconomic History  . Marital status: Divorced    Spouse name: Not on file  . Number of children: Not on file  . Years of education: Not on file  . Highest education level: Not on file  Occupational History  . Occupation: works as Technical brewer and a Barista  . Smoking status: Never Smoker  . Smokeless tobacco: Never Used  Substance and Sexual Activity  . Alcohol use: No    Comment: no drink x 3 weeks  . Drug use: No  . Sexual activity: Not on file  Other Topics Concern  . Not on file  Social History Narrative  . Not on file   Social Determinants of Health   Financial Resource Strain: Not on file  Food Insecurity: Not on file  Transportation Needs: Not on file  Physical Activity: Not on file  Stress: Not on file  Social Connections: Not on file  Intimate Partner  Violence: Not on file     Review of Systems    General:  No chills, fever, night sweats or weight changes.  Cardiovascular:  No chest pain, dyspnea on exertion, edema, orthopnea, palpitations, paroxysmal nocturnal dyspnea. Dermatological: No rash, lesions/masses Respiratory: No cough, dyspnea Urologic: No hematuria, dysuria Abdominal:   No nausea, vomiting, diarrhea, bright red blood per rectum, melena, or hematemesis Neurologic:  No visual changes, wkns, changes in mental status. All other systems reviewed and are otherwise negative except as noted above.  Physical Exam    VS:  BP 122/78 (BP Location: Left Arm, Patient Position: Sitting, Cuff Size: Normal)   Pulse 73   Wt 224 lb (101.6 kg)   SpO2 95%   BMI 33.08 kg/m  , BMI Body mass index is 33.08 kg/m. GEN: Well nourished, well developed, in no acute distress. HEENT: normal. Neck: Supple, no JVD, carotid bruits, or masses. Cardiac: RRR, no murmurs, rubs, or gallops. No clubbing, cyanosis, edema.  Radials/DP/PT 2+ and equal bilaterally.  Respiratory:  Respirations regular and unlabored, clear to auscultation bilaterally. GI: Soft, nontender, nondistended, BS + x 4. MS: no deformity or atrophy. Skin: warm and dry, no rash.  Sternal incision, chest tube sites, and left radial incision are healing well.  Clean dry intact no drainage. Neuro:  Strength and sensation are intact. Psych: Normal affect.  Accessory Clinical Findings    Recent Labs: 08/18/2020: B Natriuretic Peptide 85.3; TSH 0.906 08/23/2020: Magnesium 2.3 08/24/2020: ALT 19 08/27/2020: Hemoglobin 12.1; Platelets 330 08/28/2020: BUN 12; Creatinine, Ser 1.03; Potassium 4.2; Sodium 135   Recent Lipid Panel    Component Value Date/Time   CHOL 163 08/18/2020 0631   CHOL 190 10/12/2019 0925   TRIG 63 08/18/2020 0631   HDL 40 (L) 08/18/2020 0631   HDL 36 (L) 10/12/2019 0925   CHOLHDL 4.1 08/18/2020 0631   VLDL 13 08/18/2020 0631   LDLCALC 110 (H) 08/18/2020  0631   LDLCALC 137 (H) 10/12/2019 0925    ECG personally reviewed by me today-none today.  EKG 08/27/2020 Atrial fibrillation with RVR 137 bpm  Echocardiogram 08/18/2020 IMPRESSIONS    1. Left ventricular ejection fraction, by estimation, is 45 to 50%. The  left ventricle has mildly decreased function. The  basal-to-mid lateral  wall appears moderately hypokinetic based on limited views. The rest of  the LV segments appear mildly  hypokinetic.There is mild concentric left ventricular hypertrophy. Left  ventricular diastolic parameters are consistent with Grade II diastolic  dysfunction (pseudonormalization).  2. Right ventricular systolic function is normal. The right ventricular  size is normal.  3. The mitral valve is degenerative. Mild to moderate mitral valve  regurgitation. Moderate mitral annular calcification.  4. The aortic valve is tricuspid. There is moderate calcification of the  aortic valve. There is moderate thickening of the aortic valve. Aortic  valve regurgitation is not visualized. There is moderate aortic stenosis  with AVA 1.0cm2, mean gradient  26mHg, peak velocity 2.752m, DOI 0.34. LVOT VTI 18.  5. The inferior vena cava is normal in size with greater than 50%  respiratory variability, suggesting right atrial pressure of 3 mmHg.   Assessment & Plan   1.  NSTEMI/three-vessel coronary artery disease -no chest pain today.  On 08/22/2020 underwent CABG x4 (LIMA-LAD left radial-diagonal, SVG-circumflex, SVG-PDA). Continue aspirin, Plavix, metoprolol, atorvastatin, Imdur, Lasix, potassium Heart healthy low-sodium diet-salty 6 given  Increase physical activity following sternal precautions  Atrial fibrillation with RVR status post left atrial appendage clipping-no further episodes of fast heart rate or skipped beats.  Was felt to be secondary to ischemia.  CHA2DS2-VASc score 3.  [place] not need [place] TSH normal Continue metoprolol, amiodarone Heart  healthy low-sodium diet-salty 6 given Increase physical activity   Ischemic cardiomyopathy-no increased work of breathing or activity intolerance today.  EF 45-50%, G2 DD Continue metoprolol, Entresto, amiodarone Heart healthy low-sodium diet-salty 6 given Increase physical activity  Repeat echocardiogram when medication optimized  Aortic stenosis-moderate aortic stenosis noted on echocardiogram.  Plan to follow outpatient. Need to be followed at least annually with echocardiogram.  Essential hypertension-BP today 122/78.  Well-controlled at home. Continue metoprolol, Entresto Heart healthy low-sodium diet-salty 6 given  Hyperlipidemia-08/18/2020: Cholesterol 163; HDL 40; LDL Cholesterol 110; Triglycerides 63; VLDL 13 Continue atorvastatin, aspirin,  Heart healthy low-sodium diet-salty 6 given Increase physical activity as tolerated   Disposition: Follow-up with Dr. O'Audie Boxn 3 months.   JeJossie NgCleaver NP-C    09/07/2020, 11:54 AM [address] Office ([phone]: This dictation was prepared with Dragon dictation along with smaller phrase technology. Any transcriptional errors that result from this process are unintentional and may not be corrected upon review.

## 2020-09-07 ENCOUNTER — Ambulatory Visit (INDEPENDENT_AMBULATORY_CARE_PROVIDER_SITE_OTHER): Payer: Self-pay | Admitting: General Practice

## 2020-09-07 ENCOUNTER — Encounter: Payer: Self-pay | Admitting: General Practice

## 2020-09-07 ENCOUNTER — Other Ambulatory Visit: Payer: Self-pay

## 2020-09-07 VITALS — BP 122/78 | HR 73 | Wt 224.0 lb

## 2020-09-07 DIAGNOSIS — I350 Nonrheumatic aortic (valve) stenosis: Secondary | ICD-10-CM

## 2020-09-07 DIAGNOSIS — I255 Ischemic cardiomyopathy: Secondary | ICD-10-CM

## 2020-09-07 DIAGNOSIS — I214 Non-ST elevation (NSTEMI) myocardial infarction: Secondary | ICD-10-CM

## 2020-09-07 DIAGNOSIS — I10 Essential (primary) hypertension: Secondary | ICD-10-CM

## 2020-09-07 DIAGNOSIS — E782 Mixed hyperlipidemia: Secondary | ICD-10-CM

## 2020-09-07 DIAGNOSIS — I4891 Unspecified atrial fibrillation: Secondary | ICD-10-CM

## 2020-09-07 NOTE — Patient Instructions (Signed)
Medication Instructions:  TAKE AMIODARONE 200MG  TONIGHT; THEN 200MG  DAILY *If you need a refill on your cardiac medications before your next appointment, please call your pharmacy*  Lab Work:   Testing/Procedures:  NONE    NONE  Special Instructions CONTINUE STERNAL PRECAUTIONS  PLEASE READ AND FOLLOW SALTY 6-ATTACHED-1,800mg  daily  Follow-Up: Your next appointment:  3 month(s) In Person with , MD OR IF UNAVAILABLE JESSE CLEAVER, FNP-C  At [facility], you and your health needs are our priority.  As part of our continuing mission to provide you with exceptional heart care, we have created designated Provider Care Teams.  These Care Teams include your primary Cardiologist (physician) and Advanced Practice Providers (APPs -  Physician Assistants and Nurse Practitioners) who all work together to provide you with the care you need, when you need it.            6 SALTY THINGS TO AVOID     1,800MG  DAILY

## 2020-09-10 ENCOUNTER — Telehealth (HOSPITAL_COMMUNITY): Payer: Self-pay

## 2020-09-10 NOTE — Telephone Encounter (Signed)
Called patient to see if he is interested in the Cardiac Rehab Program. Patient expressed interest. Explained scheduling process, patient verbalized understanding. Will contact patient for scheduling once f/u has been completed.  

## 2020-09-10 NOTE — Telephone Encounter (Signed)
Pt does not have insurance coverage at this time.  °

## 2020-09-13 ENCOUNTER — Telehealth: Payer: Self-pay

## 2020-09-13 NOTE — Telephone Encounter (Signed)
Returned pt call pt states he has 2 appts the same day. Pt would like to switch his appt with pcp virtual. Pt has been switched

## 2020-09-13 NOTE — Telephone Encounter (Signed)
Copied from CRM #[id number]. Topic: General - Other >> Sep 12, 2020  3:03 PM Nichols, Johnny E wrote: Reason for CRM: Pt has an appt on 1.27.22 at 2:30 with Dr. Johnson and an appt at 10am with cardiology on the same day/ due to surgery pt is not able to drive and will dependant upon transportation and wanted to see if Dr. Johnson can move his appt to 11 or 11:30 / Pt will check with cardiology to see if they can possibly move appt later to be close to the time of his appt at this office/ both offices are in the vicinity of each other / please advise 

## 2020-09-13 NOTE — Telephone Encounter (Signed)
Copied from CRM [id number]. Topic: General - Other >> Sep 12, 2020  3:03 PM Wyonia Hough E wrote: Reason for CRM: Pt has an appt on 1.27.22 at 2:30 with Dr. Laural Benes and an appt at 10am with cardiology on the same day/ due to surgery pt is not able to drive and will dependant upon transportation and wanted to see if Dr. Laural Benes can move his appt to 11 or 11:30 / Pt will check with cardiology to see if they can possibly move appt later to be close to the time of his appt at this office/ both offices are in the vicinity of each other / please advise

## 2020-09-14 ENCOUNTER — Ambulatory Visit: Payer: Self-pay

## 2020-09-14 NOTE — Telephone Encounter (Signed)
Patient called and asks if he can take an antihistamine since he had open heart surgery. He says he was discharged from the hospital and he's living with his sister who has a cat and that is causing his allergies to flare up, runny nose and sinus congestion. I advised the office is closed at this time and someone will call back with Dr. Henriette Combs recommendation on Monday or Tuesday. I advised he could call the pharmacy to speak to the pharmacist who should be able to answer his questions. He verbalized understanding.  Reason for Disposition . [1] Caller requesting NON-URGENT health information AND [2] PCP's office is the best resource  Answer Assessment - Initial Assessment Questions 1. REASON FOR CALL or QUESTION: "What is your reason for calling today?" or "How can I best help you?" or "What question do you have that I can help answer?"     Am I able to take antihistamine with just having open heart surgery  Protocols used: INFORMATION ONLY CALL - NO TRIAGE-A-AH

## 2020-09-17 NOTE — Telephone Encounter (Signed)
Contacted pt and went over Dr. Johnson response pt doesn't have any questions or concerns  

## 2020-09-18 ENCOUNTER — Telehealth (HOSPITAL_COMMUNITY): Payer: Self-pay | Admitting: *Deleted

## 2020-09-18 NOTE — Telephone Encounter (Signed)
Error, patient not contacted.  [name], MS, ACSM CEP 09/18/2020 1035

## 2020-09-20 ENCOUNTER — Ambulatory Visit: Payer: Self-pay | Admitting: Cardiovascular Disease

## 2020-09-24 NOTE — Progress Notes (Signed)
[address]       [place] [address]             [phone]      Byron Gerard [facility] Medical Record #[id number] Date of Birth: 04/03/1966  Referring: [name], * Primary Care: [name], MD Primary Cardiologist: Evalina Field, MD   Chief Complaint:   POST OP FOLLOW UP OPERATIVE REPORT DATE OF PROCEDURE:  08/22/2020 PREOPERATIVE DIAGNOSIS:  Recent acute myocardial infarction with left main obstruction, mild to moderate aortic stenosis. POSTOPERATIVE DIAGNOSIS:  Recent acute myocardial infarction with left main obstruction, mild to moderate aortic stenosis with presentation with acute atrial fibrillation. PROCEDURE PERFORMED:  Coronary artery bypass grafting x4 with the left internal mammary to the left anterior descending coronary artery, reverse saphenous vein graft to the diagonal coronary artery with left radial artery, reverse saphenous vein graft to  the circumflex coronary artery, reverse vein graft to the posterior descending coronary artery arising from the distal circumflex with left radial artery harvest and endoscopic vein harvesting, right thigh greater saphenous vein and placement of a 40 mm  AtriClip and TEE. History of Present Illness:     Patient returns to the office today at a month postop visit following urgent coronary artery bypass grafting and placement of atrial clip.  Patient did have some degree of aortic stenosis TEE intraoperative-moderate at most and was not replaced.  The patient is doing well postoperatively increasing his physical activity appropriately.  To starting cardiac rehab next week       Past Medical History:  Diagnosis Date  . Diabetes mellitus without complication ([place])   . Hypertension   . Sleep apnea      Social History   Tobacco Use  Smoking Status Never Smoker  Smokeless Tobacco Never Used    Social History   Substance and Sexual Activity  Alcohol Use No    Comment: no drink x 3 weeks     No Known Allergies  Current Outpatient Medications  Medication Sig Dispense Refill  . acetaminophen (TYLENOL) 325 MG tablet Take 2 tablets (650 mg total) by mouth every 6 (six) hours as needed for mild pain.    . Ascorbic Acid (VITAMIN C) 1000 MG tablet Take 1,000 mg by mouth daily.    Marland Kitchen aspirin EC 81 MG EC tablet Take 1 tablet (81 mg total) by mouth daily. Swallow whole. 30 tablet 11  . atorvastatin (LIPITOR) 80 MG tablet Take 1 tablet (80 mg total) by mouth daily. 30 tablet 1  . Blood Glucose Monitoring Suppl (TRUE METRIX METER) w/Device KIT Use to check blood sugar as instructed. 1 kit 0  . Cholecalciferol (VITAMIN D3 PO) Take 1 capsule by mouth daily.    . clopidogrel (PLAVIX) 75 MG tablet Take 1 tablet (75 mg total) by mouth daily. 30 tablet 1  . Dulaglutide (TRULICITY) 1.5 DX/4.1OI SOPN Inject 1.5 mg into the skin once a week. (Patient taking differently: Inject 1.5 mg into the skin every Sunday.) 15 mL 2  . glipiZIDE (GLUCOTROL) 5 MG tablet TAKE 1 TABLET BY MOUTH 2 (TWO) TIMES DAILY BEFORE A MEAL. (Patient taking differently: Take 5 mg by mouth 2 (two) times daily before a meal.) 60 tablet 6  . glucose blood (TRUE METRIX BLOOD GLUCOSE TEST) test strip Use as instructed to check blood sugar daily. 100 each 11  . metFORMIN (GLUCOPHAGE) 1000 MG tablet Take 1 tablet (1,000 mg total) by mouth 2 (two)  times daily with a meal. 60 tablet 6  . metoprolol tartrate (LOPRESSOR) 25 MG tablet Take 1.5 tablets (37.5 mg total) by mouth 2 (two) times daily. 90 tablet 1  . sacubitril-valsartan (ENTRESTO) 24-26 MG Take 1 tablet by mouth 2 (two) times daily. 60 tablet 1  . traMADol (ULTRAM) 50 MG tablet Take 1 tablet (50 mg total) by mouth every 6 (six) hours as needed for moderate pain. 30 tablet 0  . TRUEPLUS LANCETS 28G MISC Use as instructed to check blood sugar daily. 100 each 11  . amiodarone (PACERONE) 200 MG tablet Take 1 tablet (200 mg total) by mouth daily. Needs  to be taking 200 mg once a day 60 tablet 0   No current facility-administered medications for this visit.       Physical Exam: BP (!) 146/87 (BP Location: Right Arm, Patient Position: Sitting, Cuff Size: Large)   Pulse 70   Temp 98.7 F (37.1 C) (Oral)   Resp 20   Ht '5\' 9"'  (1.753 m)   Wt 212 lb (96.2 kg)   SpO2 97% Comment: RA  BMI 31.31 kg/m   General appearance: alert and cooperative Neurologic: intact Heart: regular rate and rhythm and systolic murmur: early systolic 2/6, crescendo at 2nd left intercostal space Lungs: clear to auscultation bilaterally Abdomen: soft, non-tender; bowel sounds normal; no masses,  no organomegaly Extremities: extremities normal, atraumatic, no cyanosis or edema Wound: Sternum is stable and well-healed left arm radial harvest site is intact, the left hand is neurovascularly intact   Diagnostic Studies & Laboratory data:     Recent Radiology Findings:   DG Chest 2 View  Result Date: 09/27/2020 CLINICAL DATA:  Post CABG 08/22/2020 EXAM: CHEST - 2 VIEW COMPARISON:  08/27/2020 FINDINGS: Low lung volumes. No consolidation or edema. Persistent small left pleural effusion and adjacent atelectasis. No pneumothorax. Stable cardiomediastinal contours. No acute osseous abnormality. IMPRESSION: Persistent small left pleural effusion and adjacent atelectasis. Electronically Signed   By: Macy Mis M.D.   On: 09/27/2020 10:10    I have independently reviewed the above radiology studies  and reviewed the findings with the patient.    Recent Lab Findings: Lab Results  Component Value Date   WBC 10.6 (H) 08/27/2020   HGB 12.1 (L) 08/27/2020   HCT 35.5 (L) 08/27/2020   PLT 330 08/27/2020   GLUCOSE 145 (H) 08/28/2020   CHOL 163 08/18/2020   TRIG 63 08/18/2020   HDL 40 (L) 08/18/2020   LDLCALC 110 (H) 08/18/2020   ALT 19 08/24/2020   AST 22 08/24/2020   NA 135 08/28/2020   K 4.2 08/28/2020   CL 102 08/28/2020   CREATININE 1.03 08/28/2020   BUN  12 08/28/2020   CO2 24 08/28/2020   TSH 0.906 08/18/2020   INR 1.3 (H) 08/22/2020   HGBA1C 7.4 (H) 08/18/2020      Assessment / Plan:      #1 status post urgent coronary artery bypass grafting for presentation with left main disease and rapid atrial fibrillation.  The patient is making good progress postoperatively without evidence of congestive heart failure, -cautioned him about doing any lifting over 20 to 25 pounds for 3 months postop, was allowed to start driving #2 initial presentation with rapid atrial fibrillation-atrial clip placed and patient on amiodarone 200 mg once a day, the assumption with this would be continued for approximately 3 months postop and then discontinued #3 diabetes with complications of coronary artery disease-patient has been diligent about his glucose control  and diabetic medication since discharged.  We will plan to see back in 1 month  Medication Changes: Meds ordered this encounter  Medications  . amiodarone (PACERONE) 200 MG tablet    Sig: Take 1 tablet (200 mg total) by mouth daily. Needs to be taking 200 mg once a day    Dispense:  60 tablet    Refill:  0      Grace Isaac MD      [place] ,[place] [address] Office [phone]     09/27/2020 10:55 AM

## 2020-09-26 ENCOUNTER — Other Ambulatory Visit: Payer: Self-pay | Admitting: Cardiothoracic Surgery

## 2020-09-26 DIAGNOSIS — Z951 Presence of aortocoronary bypass graft: Secondary | ICD-10-CM

## 2020-09-27 ENCOUNTER — Encounter: Payer: Self-pay | Admitting: Internal Medicine

## 2020-09-27 ENCOUNTER — Ambulatory Visit (INDEPENDENT_AMBULATORY_CARE_PROVIDER_SITE_OTHER): Payer: Self-pay | Admitting: Cardiothoracic Surgery

## 2020-09-27 ENCOUNTER — Other Ambulatory Visit: Payer: Self-pay | Admitting: Cardiothoracic Surgery

## 2020-09-27 ENCOUNTER — Ambulatory Visit: Payer: Self-pay | Attending: Internal Medicine | Admitting: Internal Medicine

## 2020-09-27 ENCOUNTER — Ambulatory Visit
Admission: RE | Admit: 2020-09-27 | Discharge: 2020-09-27 | Disposition: A | Discharge status: Home / Self Care | Payer: Self-pay | Source: Ambulatory Visit | Attending: Cardiothoracic Surgery | Admitting: Cardiothoracic Surgery

## 2020-09-27 ENCOUNTER — Encounter: Payer: Self-pay | Admitting: Cardiothoracic Surgery

## 2020-09-27 ENCOUNTER — Other Ambulatory Visit: Payer: Self-pay

## 2020-09-27 VITALS — BP 146/87 | HR 70 | Temp 98.7°F | Resp 20 | Ht 69.0 in | Wt 212.0 lb

## 2020-09-27 VITALS — BP 122/77

## 2020-09-27 DIAGNOSIS — Z09 Encounter for follow-up examination after completed treatment for conditions other than malignant neoplasm: Secondary | ICD-10-CM

## 2020-09-27 DIAGNOSIS — Z951 Presence of aortocoronary bypass graft: Secondary | ICD-10-CM

## 2020-09-27 DIAGNOSIS — E1169 Type 2 diabetes mellitus with other specified complication: Secondary | ICD-10-CM

## 2020-09-27 DIAGNOSIS — I25810 Atherosclerosis of coronary artery bypass graft(s) without angina pectoris: Secondary | ICD-10-CM

## 2020-09-27 DIAGNOSIS — E669 Obesity, unspecified: Secondary | ICD-10-CM

## 2020-09-27 DIAGNOSIS — I10 Essential (primary) hypertension: Secondary | ICD-10-CM

## 2020-09-27 DIAGNOSIS — I480 Paroxysmal atrial fibrillation: Secondary | ICD-10-CM

## 2020-09-27 MED ORDER — AMIODARONE HCL 200 MG PO TABS
200.0000 mg | ORAL_TABLET | Freq: Every day | ORAL | 0 refills | Status: DC
Start: 2020-09-27 — End: 2021-01-30

## 2020-09-27 MED FILL — ?GLIPIZIDE 5MG TABLET: 5 | 30 days supply | Qty: 60 | Fill #1

## 2020-09-27 MED FILL — ?CLOPIDOGREL 75 MG TABL: 75 | 30 days supply | Qty: 30 | Fill #1

## 2020-09-27 MED FILL — AMIODARONE HCL 200 MG TAB: 200 | 30 days supply | Qty: 74 | Fill #1

## 2020-09-27 MED FILL — ENTRESTO 24 MG-26 MG TABLET: 24-26 | 30 days supply | Qty: 60 | Fill #1

## 2020-09-27 MED FILL — ?METOPROLOL TARTRATE 25MG T: 25 | 30 days supply | Qty: 90 | Fill #1

## 2020-09-27 MED FILL — METFORMIN HCL 1000 MG TABS: 1000 | 30 days supply | Qty: 60 | Fill #1

## 2020-09-27 MED FILL — ATORVASTATIN CALCIUM 80 MG: 80 | 30 days supply | Qty: 30 | Fill #1

## 2020-09-27 NOTE — Progress Notes (Signed)
Virtual Visit via Telephone Note  I connected with Johnny Nichols on 09/27/20 at  2:30 PM EST by telephone and verified that I am speaking with the correct person using two identifiers.  Location: Patient: home Provider: office   I discussed the limitations, risks, security and privacy concerns of performing an evaluation and management service by telephone and the availability of in person appointments. I also discussed with the patient that there may be a patient responsible charge related to this service. The patient expressed understanding and agreed to proceed.   History of Present Illness: DM, HTN, OSA, HL,obesity.  Last visit with me was February of this year.   CAD/HTN:  Since last visit with me, he had NSTEMI requiring hospitalization 12/18-20 04/2020.  He presented with chest pains and in rapid atrial fibrillation..  Cardiac catheterization showed severe left main disease; moderate aortic stenosis.  He underwent CABG x4 by Dr. Servando Snare He did well post surgery and was discharged home in a stable condition. Saw cardiologist, Dr. Kathalene Frames NP  09/07/2020 and Dr. Servando Snare today.  He is healing well.  He will be starting cardiac rehab next month.  States it will take about 3 mth before he can head back to work. Cleared to drive. Compliant with meds and salt restriction.  He is on amiodarone.  He states that the plan is to keep him on it for total of 3 months.   DM/Obesity Lab Results  Component Value Date   HGBA1C 7.4 (H) 08/18/2020  Reports compliance with Metformin, glipizide and Trulicity. BS every morning 87-115 Down to 211 lbs from 253 lbs in 05/2020.  Suppose to start cardiac rehab 10/11/2020 Doing better with eating habits.  Trying to stick to less than 80 carbs per meal   Outpatient Encounter Medications as of 09/27/2020  Medication Sig  . acetaminophen (TYLENOL) 325 MG tablet Take 2 tablets (650 mg total) by mouth every 6 (six) hours as needed for mild pain.  Marland Kitchen  amiodarone (PACERONE) 200 MG tablet Take 1 tablet (200 mg total) by mouth daily. Needs to be taking 200 mg once a day  . Ascorbic Acid (VITAMIN C) 1000 MG tablet Take 1,000 mg by mouth daily.  Marland Kitchen aspirin EC 81 MG EC tablet Take 1 tablet (81 mg total) by mouth daily. Swallow whole.  Marland Kitchen atorvastatin (LIPITOR) 80 MG tablet Take 1 tablet (80 mg total) by mouth daily.  . Blood Glucose Monitoring Suppl (TRUE METRIX METER) w/Device KIT Use to check blood sugar as instructed.  . Cholecalciferol (VITAMIN D3 PO) Take 1 capsule by mouth daily.  . clopidogrel (PLAVIX) 75 MG tablet Take 1 tablet (75 mg total) by mouth daily.  . Dulaglutide (TRULICITY) 1.5 TD/1.7OH SOPN Inject 1.5 mg into the skin once a week. (Patient taking differently: Inject 1.5 mg into the skin every Sunday.)  . glipiZIDE (GLUCOTROL) 5 MG tablet TAKE 1 TABLET BY MOUTH 2 (TWO) TIMES DAILY BEFORE A MEAL. (Patient taking differently: Take 5 mg by mouth 2 (two) times daily before a meal.)  . glucose blood (TRUE METRIX BLOOD GLUCOSE TEST) test strip Use as instructed to check blood sugar daily.  . metFORMIN (GLUCOPHAGE) 1000 MG tablet Take 1 tablet (1,000 mg total) by mouth 2 (two) times daily with a meal.  . metoprolol tartrate (LOPRESSOR) 25 MG tablet Take 1.5 tablets (37.5 mg total) by mouth 2 (two) times daily.  . sacubitril-valsartan (ENTRESTO) 24-26 MG Take 1 tablet by mouth 2 (two) times daily.  . traMADol Veatrice Bourbon)  50 MG tablet Take 1 tablet (50 mg total) by mouth every 6 (six) hours as needed for moderate pain.  . TRUEPLUS LANCETS 28G MISC Use as instructed to check blood sugar daily.   No facility-administered encounter medications on file as of 09/27/2020.      Observations/Objective: Lab Results  Component Value Date   HGBA1C 7.4 (H) 08/18/2020     Chemistry      Component Value Date/Time   NA 135 08/28/2020 0943   NA 139 05/14/2020 1506   K 4.2 08/28/2020 0943   CL 102 08/28/2020 0943   CO2 24 08/28/2020 0943   BUN 12  08/28/2020 0943   BUN 16 05/14/2020 1506   CREATININE 1.03 08/28/2020 0943   CREATININE 0.95 10/11/2015 1541      Component Value Date/Time   CALCIUM 9.3 08/28/2020 0943   ALKPHOS 46 08/24/2020 0514   AST 22 08/24/2020 0514   ALT 19 08/24/2020 0514   BILITOT 1.2 08/24/2020 0514   BILITOT 0.3 05/14/2020 1506     Lab Results  Component Value Date   WBC 10.6 (H) 08/27/2020   HGB 12.1 (L) 08/27/2020   HCT 35.5 (L) 08/27/2020   MCV 86.0 08/27/2020   PLT 330 08/27/2020      Assessment and Plan: 1. Hospital discharge follow-up  2. 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  He has already followed up with a cardiologist and the surgeon.  He will continue current medications as prescribed by them including metoprolol, Plavix, atorvastatin, aspirin, Entresto  3. Essential hypertension Patient reports his blood pressure has been doing good less than 130/80 at home.  Continue current medications and low-salt diet.  4. Type 2 diabetes mellitus with obesity ([place]) Blood sugars are at goal.  Continue Trulicity and oral medications.  5. Paroxysmal atrial fibrillation ([place]) Followed by cardiology.   Follow Up Instructions: 3 mths in-person  I discussed the assessment and treatment plan with the patient. The patient was provided an opportunity to ask questions and all were answered. The patient agreed with the plan and demonstrated an understanding of the instructions.   The patient was advised to call back or seek an in-person evaluation if the symptoms worsen or if the condition fails to improve as anticipated.  I provided 15 minutes of non-face-to-face time during this encounter.   Karle Plumber, MD

## 2020-10-06 ENCOUNTER — Telehealth (HOSPITAL_COMMUNITY): Payer: Self-pay

## 2020-10-06 NOTE — Telephone Encounter (Signed)
Cardiac Rehab Medication Review by a Pharmacist  Does the patient  feel that his/her medications are working for him/her?  yes  Has the patient been experiencing any side effects to the medications prescribed?  no  Does the patient measure his/her own blood pressure or blood glucose at home?  yes - the patient states that their blood glucose this morning was 106 and their blood pressure was 111/67 with a pulse of 62. The patient reports that this is on par with their usual blood pressure/glucose readings.  Does the patient have any problems obtaining medications due to transportation or finances?   no  Understanding of regimen: good Understanding of indications: good Potential of compliance: good   Pharmacist Intervention: The patient mentioned that one morning their fasting blood glucose was 68, but they did not have hypoglycemic symptoms. The patient and I discussed the signs and symptoms of hypoglycemia and how to treat it. The patient verbalized understanding and states that they watch their blood glucose closely by taking it every morning.    Sanda Klein, PharmD, RPh  PGY-1 Pharmacy Resident 10/06/2020 11:04 AM

## 2020-10-09 ENCOUNTER — Encounter (HOSPITAL_COMMUNITY)
Admission: RE | Admit: 2020-10-09 | Discharge: 2020-10-09 | Disposition: A | Discharge status: Home / Self Care | Payer: MEDICAID | Source: Ambulatory Visit | Attending: Cardiovascular Disease | Admitting: Cardiovascular Disease

## 2020-10-09 ENCOUNTER — Other Ambulatory Visit: Payer: Self-pay

## 2020-10-09 ENCOUNTER — Encounter (HOSPITAL_COMMUNITY): Payer: Self-pay

## 2020-10-09 VITALS — BP 118/72 | HR 73 | Ht 68.75 in | Wt 217.6 lb

## 2020-10-09 DIAGNOSIS — I214 Non-ST elevation (NSTEMI) myocardial infarction: Secondary | ICD-10-CM | POA: Insufficient documentation

## 2020-10-09 DIAGNOSIS — Z951 Presence of aortocoronary bypass graft: Secondary | ICD-10-CM | POA: Insufficient documentation

## 2020-10-09 HISTORY — DX: Hyperlipidemia, unspecified: E78.5

## 2020-10-09 HISTORY — DX: Atherosclerotic heart disease of native coronary artery without angina pectoris: I25.10

## 2020-10-09 NOTE — Progress Notes (Signed)
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  acetaminophen (TYLENOL) 325 MG tablet, Take 2 tablets (650 mg total) by mouth every 6 (six) hours as needed for mild pain., Disp: , Rfl:  .  amiodarone (PACERONE) 200 MG tablet, Take 1 tablet (200 mg total) by mouth daily. Needs to be taking 200 mg once a day, Disp: 60 tablet, Rfl: 0 .  Ascorbic Acid (VITAMIN C) 1000 MG tablet, Take 1,000 mg by mouth daily., Disp: , Rfl:  .  aspirin EC 81 MG EC tablet, Take 1 tablet (81 mg total) by mouth daily. Swallow whole., Disp: 30 tablet, Rfl: 11 .  atorvastatin (LIPITOR) 80 MG tablet, Take 1 tablet (80 mg total) by mouth daily., Disp: 30 tablet, Rfl: 1 .  Blood Glucose Monitoring Suppl (TRUE METRIX METER) w/Device KIT, Use to check blood sugar as instructed., Disp: 1 kit, Rfl: 0 .  Cholecalciferol (VITAMIN D3 PO), Take 1 capsule by mouth daily., Disp: , Rfl:  .  clopidogrel (PLAVIX) 75 MG tablet, Take 1 tablet (75 mg total) by mouth daily., Disp: 30 tablet, Rfl: 1 .  Dulaglutide (TRULICITY) 1.5 OP/9.2TW SOPN, Inject 1.5 mg into the skin once a week. (Patient taking differently: Inject 1.5 mg into the skin every Sunday.), Disp: 15 mL, Rfl: 2 .  glipiZIDE (GLUCOTROL) 5 MG tablet, TAKE 1 TABLET BY MOUTH 2 (TWO) TIMES DAILY BEFORE A MEAL. (Patient taking differently: Take 5 mg by mouth 2 (two) times daily before a  meal.), Disp: 60 tablet, Rfl: 6 .  glucose blood (TRUE METRIX BLOOD GLUCOSE TEST) test strip, Use as instructed to check blood sugar daily., Disp: 100 each, Rfl: 11 .  metFORMIN (GLUCOPHAGE) 1000 MG tablet, Take 1 tablet (1,000 mg total) by mouth 2 (two) times daily with a meal., Disp: 60 tablet, Rfl: 6 .  metoprolol tartrate (LOPRESSOR) 25 MG tablet, Take 1.5 tablets (37.5 mg total) by mouth 2 (two) times daily., Disp: 90 tablet, Rfl: 1 .  sacubitril-valsartan (ENTRESTO) 24-26 MG, Take 1 tablet by mouth 2 (two) times daily., Disp: 60 tablet, Rfl: 1 .  traMADol (ULTRAM) 50 MG tablet, Take 1 tablet (50 mg total) by mouth every 6 (six) hours as needed for moderate pain., Disp: 30 tablet, Rfl: 0 .  TRUEPLUS LANCETS 28G MISC, Use as instructed to check blood sugar daily., Disp: 100 each, Rfl: 11  Past Medical History: Past Medical History:  Diagnosis Date  . Coronary artery disease   . Diabetes mellitus without complication ([place])   . Hyperlipidemia   . Hypertension   . Sleep apnea     Tobacco Use: Social History   Tobacco Use  Smoking Status Never Smoker  Smokeless Tobacco Never Used    Labs: Recent Review Scientist, physiological    Labs for ITP Cardiac and Pulmonary Rehab Latest Ref Rng & Units 08/22/2020 08/22/2020 08/22/2020 08/25/2020 08/25/2020   Cholestrol 0 - 200 mg/dL - - - - -  LDLCALC 0 - 99 mg/dL - - - - -   HDL >40 mg/dL - - - - -   Trlycerides <150 mg/dL - - - - -   Hemoglobin A1c 4.8 - 5.6 % - - - - -   PHART 7.350 - 7.450 7.412 7.401 7.411 - -   PCO2ART 32.0 - 48.0 mmHg 39.8 36.5 34.2 - -   HCO3 20.0 - 28.0 mmol/L 25.5 22.5 21.6 - -   TCO2 22 - 32 mmol/L '27 24 23 ' - -   ACIDBASEDEF 0.0 - 2.0 mmol/L - 2.0 2.0 - -   O2SAT % 99.0 94.0 95.0 63.6 66.0      Capillary Blood Glucose: Lab Results  Component Value Date   GLUCAP 132 (H) 08/28/2020   GLUCAP 80 08/28/2020   GLUCAP 237 (H) 08/27/2020   GLUCAP 192 (H) 08/27/2020   GLUCAP 185 (H) 08/27/2020     Exercise Target  Goals: Exercise Program Goal: Individual exercise prescription set using results from initial 6 min walk test and THRR while considering  patient's activity barriers and safety.   Exercise Prescription Goal: Starting with aerobic activity 30 plus minutes a day, 3 days per week for initial exercise prescription. Provide home exercise prescription and guidelines that participant acknowledges understanding prior to discharge.  Activity Barriers & Risk Stratification:  Activity Barriers & Cardiac Risk Stratification - 10/09/20 0813      Activity Barriers & Cardiac Risk Stratification   Activity Barriers None    Cardiac Risk Stratification High           6 Minute Walk:  6 Minute Walk    Row Name 10/09/20 0828         6 Minute Walk   Phase Initial     Distance 1537 feet     Walk Time 6 minutes     # of Rest Breaks 0     MPH 2.91     METS 3.68     RPE 11     Perceived Dyspnea  0     VO2 Peak 12.89     Symptoms No     Resting HR 73 bpm     Resting BP 118/72     Resting Oxygen Saturation  99 %     Exercise Oxygen Saturation  during 6 min walk 99 %     Max Ex. HR 84 bpm     Max Ex. BP 118/76     2 Minute Post BP 108/72            Oxygen Initial Assessment:   Oxygen Re-Evaluation:   Oxygen Discharge (Final Oxygen Re-Evaluation):   Initial Exercise Prescription:  Initial Exercise Prescription - 10/09/20 0900      Date of Initial Exercise RX and Referring Provider   Date 10/09/20    Referring Provider O'Neal, Cassie Freer, MD    Expected Discharge Date 12/08/18      Track   Minutes 45      Prescription Details   Frequency (times per week) 5-7    Duration Progress to 50 minutes of aerobic without signs/symptoms of physical distress      Intensity   THRR 40-80% of Max Heartrate 66-133    Ratings of Perceived Exertion 11-13    Perceived Dyspnea 0-4      Progression   Progression Continue to progress workloads to maintain intensity without signs/symptoms  of physical distress.      Resistance Training   Training Prescription Yes  Weight 3-5 lbs    Reps 10-15           Perform Capillary Blood Glucose checks as needed.  Exercise Prescription Changes:   Exercise Comments:   Exercise Comments    Row Name 10/09/20 0810           Exercise Comments Reviewed home exercise guidelines with patient.              Exercise Goals and Review:   Exercise Goals    Row Name 10/09/20 [phone]             Exercise Goals   Increase Physical Activity Yes       Intervention Provide advice, education, support and counseling about physical activity/exercise needs.;Develop an individualized exercise prescription for aerobic and resistive training based on initial evaluation findings, risk stratification, comorbidities and participant's personal goals.       Expected Outcomes Short Term: Attend rehab on a regular basis to increase amount of physical activity.;Long Term: Exercising regularly at least 3-5 days a week.;Long Term: Add in home exercise to make exercise part of routine and to increase amount of physical activity.       Increase Strength and Stamina Yes       Intervention Provide advice, education, support and counseling about physical activity/exercise needs.;Develop an individualized exercise prescription for aerobic and resistive training based on initial evaluation findings, risk stratification, comorbidities and participant's personal goals.       Expected Outcomes Short Term: Increase workloads from initial exercise prescription for resistance, speed, and METs.;Short Term: Perform resistance training exercises routinely during rehab and add in resistance training at home;Long Term: Improve cardiorespiratory fitness, muscular endurance and strength as measured by increased METs and functional capacity (6MWT)       Able to understand and use rate of perceived exertion (RPE) scale Yes       Intervention Provide education and explanation  on how to use RPE scale       Expected Outcomes Short Term: Able to use RPE daily in rehab to express subjective intensity level;Long Term:  Able to use RPE to guide intensity level when exercising independently       Knowledge and understanding of Target Heart Rate Range (THRR) Yes       Intervention Provide education and explanation of THRR including how the numbers were predicted and where they are located for reference       Expected Outcomes Short Term: Able to state/look up THRR;Long Term: Able to use THRR to govern intensity when exercising independently;Short Term: Able to use daily as guideline for intensity in rehab       Able to check pulse independently Yes       Intervention Provide education and demonstration on how to check pulse in carotid and radial arteries.;Review the importance of being able to check your own pulse for safety during independent exercise       Expected Outcomes Short Term: Able to explain why pulse checking is important during independent exercise;Long Term: Able to check pulse independently and accurately       Understanding of Exercise Prescription Yes       Intervention Provide education, explanation, and written materials on patient's individual exercise prescription       Expected Outcomes Short Term: Able to explain program exercise prescription;Long Term: Able to explain home exercise prescription to exercise independently              Exercise Goals Re-Evaluation :  Discharge Exercise Prescription (Final Exercise Prescription Changes):   Nutrition:  Target Goals: Understanding of nutrition guidelines, daily intake of sodium <1560m, cholesterol <2027m calories 30% from fat and 7% or less from saturated fats, daily to have 5 or more servings of fruits and vegetables.  Biometrics:  Pre Biometrics - 10/09/20 0749      Pre Biometrics   Waist Circumference 43.25 inches    Hip Circumference 43.5 inches    Waist to Hip Ratio 0.99 %    Triceps  Skinfold 18 mm    % Body Fat 31 %    Grip Strength 34.5 kg    Flexibility 9.25 in    Single Leg Stand 30 seconds            Nutrition Therapy Plan and Nutrition Goals:   Nutrition Assessments:  MEDIFICTS Score Key:  ?70 Need to make dietary changes   40-70 Heart Healthy Diet  ? 40 Therapeutic Level Cholesterol Diet   Picture Your Plate Scores:  <4<01nhealthy dietary pattern with much room for improvement.  41-50 Dietary pattern unlikely to meet recommendations for good health and room for improvement.  51-60 More healthful dietary pattern, with some room for improvement.   >60 Healthy dietary pattern, although there may be some specific behaviors that could be improved.    Nutrition Goals Re-Evaluation:   Nutrition Goals Discharge (Final Nutrition Goals Re-Evaluation):   Psychosocial: Target Goals: Acknowledge presence or absence of significant depression and/or stress, maximize coping skills, provide positive support system. Participant is able to verbalize types and ability to use techniques and skills needed for reducing stress and depression.  Initial Review & Psychosocial Screening:  Initial Psych Review & Screening - 10/09/20 080071    Initial Review   Current issues with Current Stress Concerns    Source of Stress Concerns Occupation;Unable to participate in former interests or hobbies;Retirement/disability    Comments AnHipolitos not sure when he will be able to return to work this is a stressor for him      [place]   [place] currently living with his sister who he has for support     Barriers   Psychosocial barriers to participate in program The patient should benefit from training in stress management and relaxation.      Screening Interventions   Interventions Encouraged to exercise;To provide support and resources with identified psychosocial needs    Expected Outcomes Long Term Goal: Stressors or current  issues are controlled or eliminated.;Short Term goal: Utilizing psychosocial counselor, staff and physician to assist with identification of specific Stressors or current issues interfering with healing process. Setting desired goal for each stressor or current issue identified.           Quality of Life Scores:  Quality of Life - 10/09/20 0915      Quality of Life   Select Quality of Life      Quality of Life Scores   Health/Function Pre 22.4 %    Socioeconomic Pre 20.5 %    Psych/Spiritual Pre 24.29 %    Family Pre 25 %    GLOBAL Pre 22.48 %          Scores of 19 and below usually indicate a poorer quality of life in these areas.  A difference of  2-3 points is a clinically meaningful difference.  A difference of 2-3 points in the total score of the Quality of Life Index has been  associated with significant improvement in overall quality of life, self-image, physical symptoms, and general health in studies assessing change in quality of life.  PHQ-9: Recent Review Flowsheet Data    Depression screen [facility] 2/9 10/09/2020 08/13/2020 05/14/2020 10/04/2019 02/08/2018   Decreased Interest 0 0 0 0 0   Down, Depressed, Hopeless 0 0 0 0 0   PHQ - 2 Score 0 0 0 0 0   Altered sleeping - - 3 - -   Tired, decreased energy - - 0 - -   Change in appetite - - 0 - -   Feeling bad or failure about yourself  - - 0 - -   Trouble concentrating - - 0 - -   Moving slowly or fidgety/restless - - 0 - -   Suicidal thoughts - - 0 - -   PHQ-9 Score - - 3 - -   Difficult doing work/chores - - Not difficult at all - -     Interpretation of Total Score  Total Score Depression Severity:  1-4 = Minimal depression, 5-9 = Mild depression, 10-14 = Moderate depression, 15-19 = Moderately severe depression, 20-27 = Severe depression   Psychosocial Evaluation and Intervention:   Psychosocial Re-Evaluation:   Psychosocial Discharge (Final Psychosocial Re-Evaluation):   Vocational Rehabilitation: Provide  vocational rehab assistance to qualifying candidates.   Vocational Rehab Evaluation & Intervention:  Vocational Rehab - 10/09/20 0826      Initial Vocational Rehab Evaluation & Intervention   Assessment shows need for Vocational Rehabilitation Yes    Vocational Rehab Packet given to patient 10/09/20      Vocational Rehab Re-Evaulation   Comments Marquavion will review packet and discuss with his cardiologist. Patient to make deciscion about proceeding with vocational rehab           Education: Education Goals: Education classes will be provided on a weekly basis, covering required topics. Participant will state understanding/return demonstration of topics presented.  Learning Barriers/Preferences:  Learning Barriers/Preferences - 10/09/20 1129      Learning Barriers/Preferences   Learning Barriers Sight   wears contacts   Learning Preferences Verbal Instruction;Skilled Demonstration;Pictoral;Individual Instruction;Audio           Education Topics: Hypertension, Hypertension Reduction -Define heart disease and high blood pressure. Discus how high blood pressure affects the body and ways to reduce high blood pressure.   Exercise and Your Heart -Discuss why it is important to exercise, the FITT principles of exercise, normal and abnormal responses to exercise, and how to exercise safely.   Angina -Discuss definition of angina, causes of angina, treatment of angina, and how to decrease risk of having angina.   Cardiac Medications -Review what the following cardiac medications are used for, how they affect the body, and side effects that may occur when taking the medications.  Medications include Aspirin, Beta blockers, calcium channel blockers, ACE Inhibitors, angiotensin receptor blockers, diuretics, digoxin, and antihyperlipidemics.   Congestive Heart Failure -Discuss the definition of CHF, how to live with CHF, the signs and symptoms of CHF, and how keep track of weight  and sodium intake.   Heart Disease and Intimacy -Discus the effect sexual activity has on the heart, how changes occur during intimacy as we age, and safety during sexual activity.   Smoking Cessation / COPD -Discuss different methods to quit smoking, the health benefits of quitting smoking, and the definition of COPD.   Nutrition I: Fats -Discuss the types of cholesterol, what cholesterol does to the heart, and how  cholesterol levels can be controlled.   Nutrition II: Labels -Discuss the different components of food labels and how to read food label   Heart Parts/Heart Disease and PAD -Discuss the anatomy of the heart, the pathway of blood circulation through the heart, and these are affected by heart disease.   Stress I: Signs and Symptoms -Discuss the causes of stress, how stress may lead to anxiety and depression, and ways to limit stress.   Stress II: Relaxation -Discuss different types of relaxation techniques to limit stress.   Warning Signs of Stroke / TIA -Discuss definition of a stroke, what the signs and symptoms are of a stroke, and how to identify when someone is having stroke.   Knowledge Questionnaire Score:  Knowledge Questionnaire Score - 10/09/20 0915      Knowledge Questionnaire Score   Pre Score 21/24           Core Components/Risk Factors/Patient Goals at Admission:  Personal Goals and Risk Factors at Admission - 10/09/20 0850      Core Components/Risk Factors/Patient Goals on Admission    Weight Management Yes;Obesity;Weight Loss    Intervention Weight Management/Obesity: Establish reasonable short term and long term weight goals.;Obesity: Provide education and appropriate resources to help participant work on and attain dietary goals.    Admit Weight 217 lb 9.5 oz (98.7 kg)    Goal Weight: Short Term 197 lb (89.4 kg)    Goal Weight: Long Term 180 lb (81.6 kg)    Expected Outcomes Short Term: Continue to assess and modify interventions until  short term weight is achieved;Long Term: Adherence to nutrition and physical activity/exercise program aimed toward attainment of established weight goal;Weight Loss: Understanding of general recommendations for a balanced deficit meal plan, which promotes 1-2 lb weight loss per week and includes a negative energy balance of [phone] kcal/d    Diabetes Yes    Intervention Provide education about signs/symptoms and action to take for hypo/hyperglycemia.;Provide education about proper nutrition, including hydration, and aerobic/resistive exercise prescription along with prescribed medications to achieve blood glucose in normal ranges: Fasting glucose 65-99 mg/dL    Expected Outcomes Short Term: Participant verbalizes understanding of the signs/symptoms and immediate care of hyper/hypoglycemia, proper foot care and importance of medication, aerobic/resistive exercise and nutrition plan for blood glucose control.;Long Term: Attainment of HbA1C < 7%.    Hypertension Yes    Intervention Provide education on lifestyle modifcations including regular physical activity/exercise, weight management, moderate sodium restriction and increased consumption of fresh fruit, vegetables, and low fat dairy, alcohol moderation, and smoking cessation.;Monitor prescription use compliance.    Expected Outcomes Short Term: Continued assessment and intervention until BP is < 140/39m HG in hypertensive participants. < 130/860mHG in hypertensive participants with diabetes, heart failure or chronic kidney disease.;Long Term: Maintenance of blood pressure at goal levels.    Lipids Yes    Intervention Provide education and support for participant on nutrition & aerobic/resistive exercise along with prescribed medications to achieve LDL <7017mHDL >5m51m  Expected Outcomes Short Term: Participant states understanding of desired cholesterol values and is compliant with medications prescribed. Participant is following exercise  prescription and nutrition guidelines.;Long Term: Cholesterol controlled with medications as prescribed, with individualized exercise RX and with personalized nutrition plan. Value goals: LDL < 70mg70mL > 40 mg.           Core Components/Risk Factors/Patient Goals Review:    Core Components/Risk Factors/Patient Goals at Discharge (Final Review):    ITP Comments:  ITP Comments    Row Name 10/09/20 0749 10/09/20 3543         ITP Comments Medical Director- Dr. Fransico Him, MD Dr. Fransico Him MD, Medical Director             Comments:Derryck attended orientation on 10/09/2020 to review rules and guidelines for program.  Completed 6 minute walk test, Intitial ITP, and exercise prescription.  VSS. Telemetry-Sinus Rhythm.  Asymptomatic. Safety measures and social distancing in place per CDC guidelines. 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

## 2020-10-09 NOTE — Progress Notes (Signed)
Patient is participating in the virtual cardiac rehab program via the [facility] app. Reviewed home exercise guidelines with patient including endpoints, temperature precautions, target heart rate and rate of perceived exertion. Patient is currently walking 60 minutes, 3-4 days/week as his mode of home exercise. Patient has access to hand weights that he can use for his resistance training. We discussed avoiding stretches that cause sternal pain or discomfort. Patient voices understanding of instructions given.  [name], MS, ACSM CEP

## 2020-10-09 NOTE — Progress Notes (Signed)
Pt is interested in participating in Virtual Cardiac and Pulmonary Rehab. Pt advised that Virtual Cardiac and Pulmonary Rehab is provided at no cost to the patient.  Checklist:  1. Pt has smart device  ie smartphone and/or ipad for downloading an app  Yes 2. Reliable internet/wifi service    Yes 3. Understands how to use their smartphone and navigate within an app.  Yes   Pt verbalized understanding and is in agreement.            Confirm Consent - In the setting of the current Covid19 crisis, you are scheduled for a phone visit with your Cardiac or Pulmonary team member.  Just as we do with many in-gym visits, in order for you to participate in this visit, we must obtain consent.  If you'd like, I can send this to your mychart (if signed up) or email for you to review.  Otherwise, I can obtain your verbal consent now.  By agreeing to a telephone visit, we'd like you to understand that the technology does not allow for your Cardiac or Pulmonary Rehab team member to perform a physical assessment, and thus may limit their ability to fully assess your ability to perform exercise programs. If your provider identifies any concerns that need to be evaluated in person, we will make arrangements to do so.  Finally, though the technology is pretty good, we cannot assure that it will always work on either your or our end and we cannot ensure that we have a secure connection.  Cardiac and Pulmonary Rehab Telehealth visits and "At Home" cardiac and pulmonary rehab are provided at no cost to you.        Are you willing to proceed?"        STAFF: Did the patient verbally acknowledge consent to telehealth visit? Document YES/NO here: Yes     Gladstone Lighter RN  Cardiac and Pulmonary Rehab Staff        Date 02/08    @ Time [phone]

## 2020-10-10 ENCOUNTER — Other Ambulatory Visit: Payer: Self-pay | Admitting: Internal Medicine

## 2020-10-11 MED FILL — TRUE METRIX GLUCOSE TEST ST: 90 days supply | Qty: 100 | Fill #0

## 2020-10-11 MED FILL — ?TRULICITY 1.5 MG/0.5 ML PE: 1.5 | 28 days supply | Qty: 2 | Fill #1

## 2020-10-12 ENCOUNTER — Encounter (HOSPITAL_COMMUNITY)
Admission: RE | Admit: 2020-10-12 | Discharge: 2020-10-12 | Disposition: A | Discharge status: Home / Self Care | Payer: MEDICAID | Source: Ambulatory Visit | Attending: Cardiovascular Disease | Admitting: Cardiovascular Disease

## 2020-10-12 ENCOUNTER — Other Ambulatory Visit: Payer: Self-pay

## 2020-10-12 NOTE — Progress Notes (Signed)
Johnny Nichols 55 y.o. 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 Coronary artery disease   . Diabetes mellitus without complication ([place])   . Hyperlipidemia   . Hypertension   . Sleep apnea      Medications reviewed.   Current Outpatient Medications:  .  acetaminophen (TYLENOL) 325 MG tablet, Take 2 tablets (650 mg total) by mouth every 6 (six) hours as needed for mild pain., Disp: , Rfl:  .  amiodarone (PACERONE) 200 MG tablet, Take 1 tablet (200 mg total) by mouth daily. Needs to be taking 200 mg once a day, Disp: 60 tablet, Rfl: 0 .  Ascorbic Acid (VITAMIN C) 1000 MG tablet, Take 1,000 mg by mouth daily., Disp: , Rfl:  .  aspirin EC 81 MG EC tablet, Take 1 tablet (81 mg total) by mouth daily. Swallow whole., Disp: 30 tablet, Rfl: 11 .  atorvastatin (LIPITOR) 80 MG tablet, Take 1 tablet (80 mg total) by mouth daily., Disp: 30 tablet, Rfl: 1 .  Blood Glucose Monitoring Suppl (TRUE METRIX METER) w/Device KIT, Use to check blood sugar as instructed., Disp: 1 kit, Rfl: 0 .  Cholecalciferol (VITAMIN D3 PO), Take 1 capsule by mouth daily., Disp: , Rfl:  .  clopidogrel (PLAVIX) 75 MG tablet, Take 1 tablet (75 mg total) by mouth daily., Disp: 30 tablet, Rfl: 1 .  Dulaglutide (TRULICITY) 1.5 TZ/0.0FV SOPN, Inject 1.5 mg into the skin once a week. (Patient taking differently: Inject 1.5 mg into the skin every Sunday.), Disp: 15 mL, Rfl: 2 .  glipiZIDE (GLUCOTROL) 5 MG tablet, TAKE 1 TABLET BY MOUTH 2 (TWO) TIMES DAILY BEFORE A MEAL. (Patient taking differently: Take 5 mg by mouth 2 (two) times daily before a meal.), Disp: 60 tablet, Rfl: 6 .  glucose blood test strip, Use as instructed to check blood sugar daily., Disp: 100 each, Rfl: 2 .  metFORMIN (GLUCOPHAGE) 1000 MG tablet, Take 1 tablet (1,000 mg total) by mouth 2 (two) times daily with a meal., Disp: 60 tablet, Rfl: 6 .  metoprolol tartrate (LOPRESSOR) 25 MG  tablet, Take 1.5 tablets (37.5 mg total) by mouth 2 (two) times daily., Disp: 90 tablet, Rfl: 1 .  sacubitril-valsartan (ENTRESTO) 24-26 MG, Take 1 tablet by mouth 2 (two) times daily., Disp: 60 tablet, Rfl: 1 .  traMADol (ULTRAM) 50 MG tablet, Take 1 tablet (50 mg total) by mouth every 6 (six) hours as needed for moderate pain., Disp: 30 tablet, Rfl: 0 .  TRUEPLUS LANCETS 28G MISC, Use as instructed to check blood sugar daily., Disp: 100 each, Rfl: 11   Ht Readings from Last 1 Encounters:  10/09/20 5' 8.75" (1.746 m)     Wt Readings from Last 3 Encounters:  10/09/20 217 lb 9.5 oz (98.7 kg)  09/27/20 212 lb (96.2 kg)  09/07/20 224 lb (101.6 kg)     There is no height or weight on file to calculate BMI.   Social History   Tobacco Use  Smoking Status Never Smoker  Smokeless Tobacco Never Used     Lab Results  Component Value Date   CHOL 163 08/18/2020   Lab Results  Component Value Date   HDL 40 (L) 08/18/2020   Lab Results  Component Value Date   LDLCALC 110 (H) 08/18/2020   Lab Results  Component Value Date   TRIG 63 08/18/2020     Lab Results  Component Value Date   HGBA1C 7.4 (  H) 08/18/2020     CBG (last 3)  No results for input(s): GLUCAP in the last 72 hours.   Nutrition Note  Spoke with pt. Nutrition Plan and Nutrition Survey goals reviewed with pt. Pt is following a Heart Healthy diet. Pt wants to lose wt. Pt has been trying to lose wt by exercising, healthier food choices, and lower carbs. He has lost 40 lbs already. Goal to lost 15-20 lbs more.  Pt has Type 2 Diabetes. Last A1C above pt stated glycemic target. He wants to get it back into the 6's. He says he typically is between 5-6%.  Pt checks CBG's 1 times a day. Fasting CBG's reportedly 65-110 mg/dL. Pt states CBGs below 70 mg/dl 5-6 times in the past few months. He has hypo awareness which he has felt in the past when taking insulin. He has not felt symptomatic when his CBGs were 65-70 mg/dl in the  morning. He was unaware of 65 being level 1 hypoglycemia. We reviewed hypoglycemia protocol. Reviewed hypoglycemia as side effect from glipizide. Reviewed eating every 4 hours. Reviewed night time snack. Encouraged pt to discuss with PCP if his CBGs continue to run <70 mg/dl in the morning. He is eating <80 g carbs per meal. Typically stays closer to 60 g per meal.  We reviewed pre and post exercise CBG testing and protocol of 110 mg/dl prior to exercise.  He also takes BP at home. He reported his home readings from the past 4 days.  Per discussion, pt does not use canned/convenience foods often. Pt does not add salt to food. Pt does not eat out frequently.  He is living with his sister who is supportive of his diet changes.   Pt expressed understanding of the information reviewed.    Nutrition Diagnosis ? Food-and nutrition-related knowledge deficit related to lack of exposure to information as related to diagnosis of: ? CVD ? Type 2 Diabetes ? Obese  I = 30-34.9 related to excessive energy intake as evidenced by a BMI 32.37 kg/m2  Nutrition Intervention ? Pt's individual nutrition plan reviewed with pt. ? Benefits of adopting Heart Healthy diet discussed when Picture Your Plate reviewed.   ? Continue client-centered nutrition education by RD, as part of interdisciplinary care.  Goal(s) ? Pt to identify food quantities necessary to achieve weight loss of 6-24 lb at graduation from cardiac rehab.  ? Pt to build a healthy plate including vegetables, fruits, whole grains, and low-fat dairy products in a heart healthy meal plan. ? CBG concentrations in the normal range or as close to normal as is safely possible (eliminate CBGs <80 mg/dl).  Plan:   Will provide client-centered nutrition education as part of interdisciplinary care  Monitor and evaluate progress toward nutrition goal with team.   Michaele Offer, MS, RDN, LDN

## 2020-10-15 DIAGNOSIS — Z736 Limitation of activities due to disability: Secondary | ICD-10-CM

## 2020-10-16 ENCOUNTER — Encounter (HOSPITAL_COMMUNITY)
Admission: RE | Admit: 2020-10-16 | Discharge: 2020-10-16 | Disposition: A | Discharge status: Home / Self Care | Payer: MEDICAID | Source: Ambulatory Visit | Attending: Cardiovascular Disease | Admitting: Cardiovascular Disease

## 2020-10-16 NOTE — Progress Notes (Signed)
Cardiac Rehab: Virtual Visit  Patient participates in the virtual cardiac rehab program via the [facility] app. Contacted patient to check progress with exercise and ask about shortness of breath logged with exercise. Patient is doing well with exercise and states he's trying to go out walking in his neighborhood every day. Patient has increased his walking distance from 1.5 miles to 2.5 miles and is trying to push to go faster each time. Patient's neighborhood is up and down hill, and the occasions that he logged SOB he states it was colder days and with going uphill. Patient states that he's not getting as winded now as he had been. Patient states that his calves get a little tight after his walk. Although he does some light stretching prior to ambulating, he hasn't been stretching after. I recommended that patient incorporate the cool-down stretches after his walk especially for the legs to help reduce tightness after exercise, and patient is amenable to this. Patient has 1-2 lbs. hand weights, and weight plates that he can use for his resistance training. I recommended that patient start with 2-3 days per week doing the hand weight exercises and always have a rest day between the same muscle group. Patient asked about when he can progress his exercise. Patient has a Photographer, and I advised that he should be fine to resume exercise at the gym, just start at low levels and increase as tolerated. Reviewed target heart rate, rate of perceived exertion, and endpoints for exercise. Patient voices understanding of instructions given. Will follow-up in 1-2 weeks.   [name], MS, ACSM CEP 10/16/2020 1420

## 2020-10-23 ENCOUNTER — Other Ambulatory Visit: Payer: Self-pay | Admitting: Physician Assistant

## 2020-10-23 ENCOUNTER — Telehealth: Payer: Self-pay | Admitting: Physician Assistant

## 2020-10-23 ENCOUNTER — Telehealth: Payer: Self-pay | Admitting: Internal Medicine

## 2020-10-23 DIAGNOSIS — U071 COVID-19: Secondary | ICD-10-CM

## 2020-10-23 MED ORDER — GUAIFENESIN ER 600 MG PO TB12: 600.0000 mg | ORAL_TABLET | Freq: Two times a day (BID) | ORAL | 2 refills | Status: DC

## 2020-10-23 NOTE — Telephone Encounter (Signed)
Patient received virtual appointment today and was advised by provider for concerns.

## 2020-10-23 NOTE — Telephone Encounter (Signed)
Copied from CRM [id number]. Topic: General - Other >> Oct 23, 2020 10:38 AM Wyonia Hough E wrote: Reason for CRM: this past Friday Pt tested positive from covid / pt asked for advise on if he could take musinex or guaifenesin to eliviate mucus/ please advise

## 2020-10-23 NOTE — Patient Instructions (Addendum)
I sent Mucinex to your pharmacy  I hope that you feel better soon   Roney Jaffe, PA-C Physician Assistant [facility] Medicine [email or web address]    Cough, Adult Coughing is a reflex that clears your throat and your airways (respiratory system). Coughing helps to heal and protect your lungs. It is normal to cough occasionally, but a cough that happens with other symptoms or lasts a long time may be a sign of a condition that needs treatment. An acute cough may only last 2-3 weeks, while a chronic cough may last 8 or more weeks. Coughing is commonly caused by:  Infection of the respiratory systemby viruses or bacteria.  Breathing in substances that irritate your lungs.  Allergies.  Asthma.  Mucus that runs down the back of your throat (postnasal drip).  Smoking.  Acid backing up from the stomach into the esophagus (gastroesophageal reflux).  Certain medicines.  Chronic lung problems.  Other medical conditions such as heart failure or a blood clot in the lung (pulmonary embolism). Follow these instructions at home: Medicines  Take over-the-counter and prescription medicines only as told by your health care provider.  Talk with your health care provider before you take a cough suppressant medicine. Lifestyle  Avoid cigarette smoke. Do not use any products that contain nicotine or tobacco, such as cigarettes, e-cigarettes, and chewing tobacco. If you need help quitting, ask your health care provider.  Drink enough fluid to keep your urine pale yellow.  Avoid caffeine.  Do not drink alcohol if your health care provider tells you not to drink.   General instructions  Pay close attention to changes in your cough. Tell your health care provider about them.  Always cover your mouth when you cough.  Avoid things that make you cough, such as perfume, candles, cleaning products, or campfire or tobacco smoke.  If the air is  dry, use a cool mist vaporizer or humidifier in your bedroom or your home to help loosen secretions.  If your cough is worse at night, try to sleep in a semi-upright position.  Rest as needed.  Keep all follow-up visits as told by your health care provider. This is important.   Contact a health care provider if you:  Have new symptoms.  Cough up pus.  Have a cough that does not get better after 2-3 weeks or gets worse.  Cannot control your cough with cough suppressant medicines and you are losing sleep.  Have pain that gets worse or pain that is not helped with medicine.  Have a fever.  Have unexplained weight loss.  Have night sweats. Get help right away if:  You cough up blood.  You have difficulty breathing.  Your heartbeat is very fast. These symptoms may represent a serious problem that is an emergency. Do not wait to see if the symptoms will go away. Get medical help right away. Call your local emergency services (911 in the [place] Do not drive yourself to the hospital. Summary  Coughing is a reflex that clears your throat and your airways. It is normal to cough occasionally, but a cough that happens with other symptoms or lasts a long time may be a sign of a condition that needs treatment.  Take over-the-counter and prescription medicines only as told by your health care provider.  Always cover your mouth when you cough.  Contact a health care provider if you have new symptoms or a cough that does not get better after 2-3 weeks or gets worse.  This information is not intended to replace advice given to you by your health care provider. Make sure you discuss any questions you have with your health care provider. Document Revised: 09/06/2018 Document Reviewed: 09/06/2018 Elsevier Patient Education  2021 [employer].

## 2020-10-23 NOTE — Progress Notes (Signed)
Patient tested positive for COVID on this pat fridy.  Patient was admitted in December for new onset of A-fib. Patient would like advice on taking cough or cold medication to help relieve the constant mucus.

## 2020-10-23 NOTE — Progress Notes (Signed)
Established Patient Office Visit  Subjective:  Patient ID: Johnny Nichols, male    DOB: March 31, 1966  Age: 55 y.o. MRN: [id number]  CC:  Chief Complaint  Patient presents with  . Covid Positive   Virtual Visit via Telephone Note  I connected with Johnny Nichols on 10/23/20 at 11:20 AM EST by telephone and verified that I am speaking with the correct person using two identifiers.  Location: Patient: Home  Provider: [facility] Medicine Unit    I discussed the limitations, risks, security and privacy concerns of performing an evaluation and management service by telephone and the availability of in person appointments. I also discussed with the patient that there may be a patient responsible charge related to this service. The patient expressed understanding and agreed to proceed.   History of Present Illness: Reports that he started having a productive cough with light yellow sputum on October 14, 2020.  States that he is currently living with his sister who was positive for Covid.  Reports that he tested positive on Friday October 19, 2020.  Has not tried anything for relief   Denies any other URI sxs   Observations/Objective: Medical history and current medications reviewed, no physical exam completed    Past Medical History:  Diagnosis Date  . Coronary artery disease   . Diabetes mellitus without complication ([place])   . Hyperlipidemia   . Hypertension   . Sleep apnea     Past Surgical History:  Procedure Laterality Date  . CARDIAC CATHETERIZATION    . CLIPPING OF ATRIAL APPENDAGE N/A 08/22/2020   Procedure: CLIPPING OF ATRIAL APPENDAGE USING ATRICURE 40MM ATRICLIP;  Surgeon: Grace Isaac, MD;  Location: [place];  Service: Open Heart Surgery;  Laterality: N/A;  . CORONARY ARTERY BYPASS GRAFT N/A 08/22/2020   Procedure: CORONARY ARTERY BYPASS GRAFTING (CABG) TIMES FOUR, USING LEFT INTERNAL MAMMARY ARTERY, LEFT RADIAL ARTERY, AND RIGHT  LEG GREATER SAPHENOUS VEIN HARVESTED ENDOSCOPICALLY;  Surgeon: Grace Isaac, MD;  Location: [place];  Service: Open Heart Surgery;  Laterality: N/A;  . ENDOVEIN HARVEST OF GREATER SAPHENOUS VEIN Right 08/22/2020   Procedure: ENDOVEIN HARVEST OF GREATER SAPHENOUS VEIN;  Surgeon: Grace Isaac, MD;  Location: [place];  Service: Open Heart Surgery;  Laterality: Right;  . LEFT HEART CATH AND CORONARY ANGIOGRAPHY N/A 08/18/2020   Procedure: LEFT HEART CATH AND CORONARY ANGIOGRAPHY;  Surgeon: Jettie Booze, MD;  Location: [place] CV LAB;  Service: Cardiovascular;  Laterality: N/A;  . RADIAL ARTERY HARVEST Left 08/22/2020   Procedure: LEFT RADIAL ARTERY HARVEST;  Surgeon: Grace Isaac, MD;  Location: [place];  Service: Open Heart Surgery;  Laterality: Left;  . TEE WITHOUT CARDIOVERSION N/A 08/22/2020   Procedure: TRANSESOPHAGEAL ECHOCARDIOGRAM (TEE);  Surgeon: Grace Isaac, MD;  Location: [place];  Service: Open Heart Surgery;  Laterality: N/A;    Family History  Problem Relation Age of Onset  . Heart disease Mother   . Cancer Father   . Hypertension Other   . Obesity Other     Social History   Socioeconomic History  . Marital status: Divorced    Spouse name: Not on file  . Number of children: Not on file  . Years of education: 71  . Highest education level: Not on file  Occupational History  . Occupation: works as Technical brewer and a Barista  . Smoking status: Never Smoker  . Smokeless tobacco: Never Used  Vaping Use  .  Vaping Use: Not on file  Substance and Sexual Activity  . Alcohol use: Yes    Alcohol/week: 1.0 standard drink    Types: 1 Cans of beer per week    Comment: socially  . Drug use: No  . Sexual activity: Not on file  Other Topics Concern  . Not on file  Social History Narrative  . Not on file   Social Determinants of Health   Financial Resource Strain: Not on file  Food Insecurity: Not on file  Transportation Needs: Not on file   Physical Activity: Not on file  Stress: Not on file  Social Connections: Not on file  Intimate Partner Violence: Not on file    Outpatient Medications Prior to Visit  Medication Sig Dispense Refill  . acetaminophen (TYLENOL) 325 MG tablet Take 2 tablets (650 mg total) by mouth every 6 (six) hours as needed for mild pain.    Marland Kitchen amiodarone (PACERONE) 200 MG tablet Take 1 tablet (200 mg total) by mouth daily. Needs to be taking 200 mg once a day 60 tablet 0  . Ascorbic Acid (VITAMIN C) 1000 MG tablet Take 1,000 mg by mouth daily.    Marland Kitchen aspirin EC 81 MG EC tablet Take 1 tablet (81 mg total) by mouth daily. Swallow whole. 30 tablet 11  . atorvastatin (LIPITOR) 80 MG tablet Take 1 tablet (80 mg total) by mouth daily. 30 tablet 1  . Blood Glucose Monitoring Suppl (TRUE METRIX METER) w/Device KIT Use to check blood sugar as instructed. 1 kit 0  . Cholecalciferol (VITAMIN D3 PO) Take 1 capsule by mouth daily.    . clopidogrel (PLAVIX) 75 MG tablet Take 1 tablet (75 mg total) by mouth daily. 30 tablet 1  . Dulaglutide (TRULICITY) 1.5 VA/9.1BT SOPN Inject 1.5 mg into the skin once a week. (Patient taking differently: Inject 1.5 mg into the skin every Sunday.) 15 mL 2  . glipiZIDE (GLUCOTROL) 5 MG tablet TAKE 1 TABLET BY MOUTH 2 (TWO) TIMES DAILY BEFORE A MEAL. (Patient taking differently: Take 5 mg by mouth 2 (two) times daily before a meal.) 60 tablet 6  . glucose blood test strip Use as instructed to check blood sugar daily. 100 each 2  . metFORMIN (GLUCOPHAGE) 1000 MG tablet Take 1 tablet (1,000 mg total) by mouth 2 (two) times daily with a meal. 60 tablet 6  . metoprolol tartrate (LOPRESSOR) 25 MG tablet Take 1.5 tablets (37.5 mg total) by mouth 2 (two) times daily. 90 tablet 1  . sacubitril-valsartan (ENTRESTO) 24-26 MG Take 1 tablet by mouth 2 (two) times daily. 60 tablet 1  . traMADol (ULTRAM) 50 MG tablet Take 1 tablet (50 mg total) by mouth every 6 (six) hours as needed for moderate pain. 30  tablet 0  . TRUEPLUS LANCETS 28G MISC Use as instructed to check blood sugar daily. 100 each 11   No facility-administered medications prior to visit.    No Known Allergies  ROS Review of Systems  Constitutional: Negative for chills, fatigue and fever.  HENT: Positive for congestion. Negative for ear pain, postnasal drip, sinus pressure, sore throat and trouble swallowing.   Eyes: Negative.   Respiratory: Positive for cough. Negative for shortness of breath and wheezing.   Cardiovascular: Negative.   Gastrointestinal: Negative for diarrhea, nausea and vomiting.  Endocrine: Negative.   Genitourinary: Negative.   Musculoskeletal: Negative for myalgias.  Skin: Negative.   Allergic/Immunologic: Negative.   Neurological: Negative for headaches.  Hematological: Negative.   Psychiatric/Behavioral: Negative.  Objective:     There were no vitals taken for this visit. Wt Readings from Last 3 Encounters:  10/09/20 217 lb 9.5 oz (98.7 kg)  09/27/20 212 lb (96.2 kg)  09/07/20 224 lb (101.6 kg)     Health Maintenance Due  Topic Date Due  . Hepatitis C Screening  Never done  . COVID-19 Vaccine (1) Never done  . FOOT EXAM  Never done  . OPHTHALMOLOGY EXAM  Never done  . INFLUENZA VACCINE  04/01/2020  . COLON CANCER SCREENING ANNUAL FOBT  10/03/2020    There are no preventive care reminders to display for this patient.  Lab Results  Component Value Date   TSH 0.906 08/18/2020   Lab Results  Component Value Date   WBC 10.6 (H) 08/27/2020   HGB 12.1 (L) 08/27/2020   HCT 35.5 (L) 08/27/2020   MCV 86.0 08/27/2020   PLT 330 08/27/2020   Lab Results  Component Value Date   NA 135 08/28/2020   K 4.2 08/28/2020   CO2 24 08/28/2020   GLUCOSE 145 (H) 08/28/2020   BUN 12 08/28/2020   CREATININE 1.03 08/28/2020   BILITOT 1.2 08/24/2020   ALKPHOS 46 08/24/2020   AST 22 08/24/2020   ALT 19 08/24/2020   PROT 5.8 (L) 08/24/2020   ALBUMIN 2.9 (L) 08/24/2020   CALCIUM  9.3 08/28/2020   ANIONGAP 9 08/28/2020   Lab Results  Component Value Date   CHOL 163 08/18/2020   Lab Results  Component Value Date   HDL 40 (L) 08/18/2020   Lab Results  Component Value Date   LDLCALC 110 (H) 08/18/2020   Lab Results  Component Value Date   TRIG 63 08/18/2020   Lab Results  Component Value Date   CHOLHDL 4.1 08/18/2020   Lab Results  Component Value Date   HGBA1C 7.4 (H) 08/18/2020      Assessment & Plan:   Problem List Items Addressed This Visit   None   Visit Diagnoses    COVID-19    -  Primary   Relevant Medications   guaiFENesin (MUCINEX) 600 MG 12 hr tablet      Meds ordered this encounter  Medications  . guaiFENesin (MUCINEX) 600 MG 12 hr tablet    Sig: Take 1 tablet (600 mg total) by mouth 2 (two) times daily.    Dispense:  60 tablet    Refill:  2    Order Specific Question:   Supervising Provider    Answer:   Asencion Noble E [[id number]]   Assessment and Plan:  1. COVID-19 Trial Mucinex, encourage plenty of increased hydration.  Red flags for prompt reevaluation - guaiFENesin (MUCINEX) 600 MG 12 hr tablet; Take 1 tablet (600 mg total) by mouth 2 (two) times daily.  Dispense: 60 tablet; Refill: 2  Follow Up Instructions:    I discussed the assessment and treatment plan with the patient. The patient was provided an opportunity to ask questions and all were answered. The patient agreed with the plan and demonstrated an understanding of the instructions.   The patient was advised to call back or seek an in-person evaluation if the symptoms worsen or if the condition fails to improve as anticipated.  I provided 12 minutes of non-face-to-face time during this encounter.     Follow-up: Return if symptoms worsen or fail to improve.    Loraine Grip Mayers, PA-C

## 2020-10-25 ENCOUNTER — Other Ambulatory Visit: Payer: Self-pay | Admitting: Physician Assistant

## 2020-10-25 ENCOUNTER — Ambulatory Visit: Payer: Self-pay | Admitting: Cardiothoracic Surgery

## 2020-10-25 MED FILL — METFORMIN HCL 1000 MG TABS: 1000 | 30 days supply | Qty: 60 | Fill #2

## 2020-10-25 MED FILL — ?GLIPIZIDE 5MG TABLET: 5 | 30 days supply | Qty: 60 | Fill #2

## 2020-10-26 ENCOUNTER — Other Ambulatory Visit: Payer: Self-pay | Admitting: Cardiovascular Disease

## 2020-10-26 MED FILL — ?METOPROLOL TARTRATE 25MG T: 25 | 30 days supply | Qty: 90 | Fill #0

## 2020-10-26 MED FILL — ATORVASTATIN CALCIUM 80 MG: 80 | 30 days supply | Qty: 30 | Fill #0

## 2020-10-26 MED FILL — ?CLOPIDOGREL 75MG TABL: 75 | 30 days supply | Qty: 30 | Fill #0

## 2020-10-30 ENCOUNTER — Telehealth (HOSPITAL_COMMUNITY): Payer: Self-pay | Admitting: *Deleted

## 2020-10-30 NOTE — Telephone Encounter (Signed)
Cardiac Rehab: Virtual Visit  Patient participates in the virtual cardiac rehab program via the [facility] app. Called patient to follow-up on exercise progression. Patient has been doing a lot of heavy housework (vacuuming, scrubbing floors) in preparation to move back into his apartment and wanted to know if he could log this as his exercise. I explained that while he doesn't need to log this as his aerobic exercise activity, it is good that he's been able to get back to these activities without issues. Patient will see his surgeon on Thursday, and I encouraged him to ask any questions he has regarding activities he can return to.   Patient feels he's making progress with his heart health and is happy with his weight loss.   Will continue to follow in the app.  [name], MS, ACSM [employer] 10/30/2020 [phone]

## 2020-11-01 ENCOUNTER — Ambulatory Visit (INDEPENDENT_AMBULATORY_CARE_PROVIDER_SITE_OTHER): Payer: Self-pay | Admitting: Cardiothoracic Surgery

## 2020-11-01 ENCOUNTER — Other Ambulatory Visit: Payer: Self-pay

## 2020-11-01 VITALS — BP 119/78 | HR 79 | Temp 97.7°F | Resp 20 | Ht 69.0 in | Wt 213.0 lb

## 2020-11-01 DIAGNOSIS — Z951 Presence of aortocoronary bypass graft: Secondary | ICD-10-CM

## 2020-11-01 NOTE — Progress Notes (Signed)
[address]       [place] [address]             [phone]      Johnny Nichols  Medical Record #[id number] Date of Birth: [birth year]  Referring: [name], * Primary Care: [name], MD Primary Cardiologist: Evalina Field, MD   Chief Complaint:   POST OP FOLLOW UP OPERATIVE REPORT DATE OF PROCEDURE:  08/22/2020 PREOPERATIVE DIAGNOSIS:  Recent acute myocardial infarction with left main obstruction, mild to moderate aortic stenosis. POSTOPERATIVE DIAGNOSIS:  Recent acute myocardial infarction with left main obstruction, mild to moderate aortic stenosis with presentation with acute atrial fibrillation. PROCEDURE PERFORMED:  Coronary artery bypass grafting x4 with the left internal mammary to the left anterior descending coronary artery, reverse saphenous vein graft to the diagonal coronary artery with left radial artery, reverse saphenous vein graft to  the circumflex coronary artery, reverse vein graft to the posterior descending coronary artery arising from the distal circumflex with left radial artery harvest and endoscopic vein harvesting, right thigh greater saphenous vein and placement of a 40 mm  AtriClip and TEE.  History of Present Illness:     Patient returns to the office today at a 2 months  postop visit following urgent coronary artery bypass grafting and placement of atrial clip.  Patient did have some degree of aortic stenosis TEE intraoperative-moderate at most and was not replaced.  The patient is doing well postoperatively increasing his physical activity appropriately.  He is currently enrolled  "virtual"cardiac rehab next week.  Patient notes that he is walking up to 3-1/2 miles a day without difficulty, denies chest pain or shortness of breath     Past Medical History:  Diagnosis Date  . Coronary artery disease   . Diabetes mellitus without complication ([facility])   . Hyperlipidemia   . Hypertension    . Sleep apnea      Social History   Tobacco Use  Smoking Status Never Smoker  Smokeless Tobacco Never Used    Social History   Substance and Sexual Activity  Alcohol Use Yes  . Alcohol/week: 1.0 standard drink  . Types: 1 Cans of beer per week   Comment: socially     No Known Allergies  Current Outpatient Medications  Medication Sig Dispense Refill  . acetaminophen (TYLENOL) 325 MG tablet Take 2 tablets (650 mg total) by mouth every 6 (six) hours as needed for mild pain.    Marland Kitchen amiodarone (PACERONE) 200 MG tablet Take 1 tablet (200 mg total) by mouth daily. Needs to be taking 200 mg once a day 60 tablet 0  . Ascorbic Acid (VITAMIN C) 1000 MG tablet Take 1,000 mg by mouth daily.    Marland Kitchen aspirin EC 81 MG EC tablet Take 1 tablet (81 mg total) by mouth daily. Swallow whole. 30 tablet 11  . atorvastatin (LIPITOR) 80 MG tablet TAKE 1 TABLET (80 MG TOTAL) BY MOUTH DAILY. 30 tablet 1  . Blood Glucose Monitoring Suppl (TRUE METRIX METER) w/Device KIT Use to check blood sugar as instructed. 1 kit 0  . Cholecalciferol (VITAMIN D3 PO) Take 1 capsule by mouth daily.    . clopidogrel (PLAVIX) 75 MG tablet TAKE 1 TABLET (75 MG TOTAL) BY MOUTH DAILY. 30 tablet 1  . Dulaglutide (TRULICITY) 1.5 BJ/6.2GB SOPN Inject 1.5 mg into the skin once a week. (Patient taking differently: Inject 1.5 mg into the skin every Sunday.) 15 mL 2  .  glipiZIDE (GLUCOTROL) 5 MG tablet TAKE 1 TABLET BY MOUTH 2 (TWO) TIMES DAILY BEFORE A MEAL. (Patient taking differently: Take 5 mg by mouth 2 (two) times daily before a meal.) 60 tablet 6  . glucose blood test strip Use as instructed to check blood sugar daily. 100 each 2  . metFORMIN (GLUCOPHAGE) 1000 MG tablet Take 1 tablet (1,000 mg total) by mouth 2 (two) times daily with a meal. 60 tablet 6  . metoprolol tartrate (LOPRESSOR) 25 MG tablet TAKE 1.5 TABLETS (37.5 MG TOTAL) BY MOUTH 2 (TWO) TIMES DAILY. 90 tablet 1  . sacubitril-valsartan (ENTRESTO) 24-26 MG Take 1 tablet  by mouth 2 (two) times daily. 60 tablet 1  . traMADol (ULTRAM) 50 MG tablet Take 1 tablet (50 mg total) by mouth every 6 (six) hours as needed for moderate pain. 30 tablet 0  . TRUEPLUS LANCETS 28G MISC Use as instructed to check blood sugar daily. 100 each 11  . guaiFENesin (MUCINEX) 600 MG 12 hr tablet Take 1 tablet (600 mg total) by mouth 2 (two) times daily. (Patient not taking: Reported on 11/01/2020) 60 tablet 2   No current facility-administered medications for this visit.       Physical Exam: BP 119/78   Pulse 79   Temp 97.7 F (36.5 C) (Skin)   Resp 20   Ht '5\' 9"'  (1.753 m)   Wt 213 lb (96.6 kg)   SpO2 95% Comment: RA  BMI 31.45 kg/m  General appearance: alert, cooperative, appears stated age and no distress Head: Normocephalic, without obvious abnormality, atraumatic Neck: no adenopathy, no carotid bruit, no JVD, supple, symmetrical, trachea midline and thyroid not enlarged, symmetric, no tenderness/mass/nodules Lymph nodes: Cervical, supraclavicular, and axillary nodes normal. Resp: clear to auscultation bilaterally Cardio: regular rate and rhythm and systolic murmur: early systolic 2/6, crescendo at lower left sternal border GI: soft, non-tender; bowel sounds normal; no masses,  no organomegaly Extremities: extremities normal, atraumatic, no cyanosis or edema and Homans sign is negative, no sign of DVT Neurologic: Grossly normal   Diagnostic Studies & Laboratory data:     Recent Radiology Findings:      Recent Lab Findings: Lab Results  Component Value Date   WBC 10.6 (H) 08/27/2020   HGB 12.1 (L) 08/27/2020   HCT 35.5 (L) 08/27/2020   PLT 330 08/27/2020   GLUCOSE 145 (H) 08/28/2020   CHOL 163 08/18/2020   TRIG 63 08/18/2020   HDL 40 (L) 08/18/2020   LDLCALC 110 (H) 08/18/2020   ALT 19 08/24/2020   AST 22 08/24/2020   NA 135 08/28/2020   K 4.2 08/28/2020   CL 102 08/28/2020   CREATININE 1.03 08/28/2020   BUN 12 08/28/2020   CO2 24 08/28/2020   TSH  0.906 08/18/2020   INR 1.3 (H) 08/22/2020   HGBA1C 7.4 (H) 08/18/2020      Assessment / Plan:      #1 status post urgent coronary artery bypass grafting for presentation with left main disease and rapid atrial fibrillation.  The patient is making good progress postoperatively without evidence of congestive heart failure,  #2 initial presentation with rapid atrial fibrillation-atrial clip placed and patient on amiodarone 200 mg once a day, the assumption with this would be continued for approximately 3 months postop and then discontinued  #3 diabetes with complications of coronary artery disease-patient has been diligent about his glucose control and diabetic medication since discharged.  Plan to see back in the surgery office as needed  Medication Changes:  No orders of the defined types were placed in this encounter.     Grace Isaac MD      [address] [place] [address] Office [phone]     11/01/2020 11:10 AM

## 2020-11-26 MED FILL — ?CLOPIDOGREL 75MG TABL: 75 | 30 days supply | Qty: 30 | Fill #1

## 2020-11-26 MED FILL — ATORVASTATIN CALCIUM 80 MG: 80 | 30 days supply | Qty: 30 | Fill #1

## 2020-11-26 MED FILL — glipiZIDE 5 MG TABS: 5 | 30 days supply | Qty: 60 | Fill #3

## 2020-11-26 MED FILL — ?METFORMIN HCL 1000 MG TAB: 1000 | 30 days supply | Qty: 60 | Fill #3

## 2020-11-26 MED FILL — ?METOPROLOL TART 25MG TABLE: 25 | 30 days supply | Qty: 90 | Fill #1

## 2020-11-26 MED FILL — AMIODARONE HCL 200 MG TAB: 200 | 30 days supply | Qty: 30 | Fill #0

## 2020-12-09 NOTE — Progress Notes (Deleted)
Cardiology Office Note:   Date:  12/09/2020  NAME:  Johnny Nichols    MRN: [id number] DOB:  04/22/1966   PCP:  Marcine Matar, MD  Cardiologist:  Reatha Harps, MD  Electrophysiologist:  None   Referring MD: Marcine Matar, MD   No chief complaint on file. ***  History of Present Illness:   [name] is a 55 y.o. male with a hx of DM, HTN, HLD, CAD s/p CABG, pAF who presents for follow-up.   Problem List 1. NSTEMI 08/2020 -3v CAD s/p CABG (LIMA-LAD, L radial to D1, SVG-LCX, SVG-PDA) 2. Paroxysmal Afib -s/p LAA clipping  3. Systolic HF, 45-50% 4. Mild to moderate AS 5. A1c 7.4  Past Medical History: Past Medical History:  Diagnosis Date  . Coronary artery disease   . Diabetes mellitus without complication ([facility])   . Hyperlipidemia   . Hypertension   . Sleep apnea     Past Surgical History: Past Surgical History:  Procedure Laterality Date  . CARDIAC CATHETERIZATION    . CLIPPING OF ATRIAL APPENDAGE N/A 08/22/2020   Procedure: CLIPPING OF ATRIAL APPENDAGE USING ATRICURE ATRICLIP;  Surgeon: Delight Ovens, MD;  Location: [facility] OR;  Service: Open Heart Surgery;  Laterality: N/A;  . CORONARY ARTERY BYPASS GRAFT N/A 08/22/2020   Procedure: CORONARY ARTERY BYPASS GRAFTING (CABG) TIMES FOUR, USING LEFT INTERNAL MAMMARY ARTERY, LEFT RADIAL ARTERY, AND RIGHT LEG GREATER SAPHENOUS VEIN HARVESTED ENDOSCOPICALLY;  Surgeon: Delight Ovens, MD;  Location: [facility] OR;  Service: Open Heart Surgery;  Laterality: N/A;  . ENDOVEIN HARVEST OF GREATER SAPHENOUS VEIN Right 08/22/2020   Procedure: ENDOVEIN HARVEST OF GREATER SAPHENOUS VEIN;  Surgeon: Delight Ovens, MD;  Location: [facility] OR;  Service: Open Heart Surgery;  Laterality: Right;  . LEFT HEART CATH AND CORONARY ANGIOGRAPHY N/A 08/18/2020   Procedure: LEFT HEART CATH AND CORONARY ANGIOGRAPHY;  Surgeon: Corky Crafts, MD;  Location: [facility] INVASIVE CV LAB;  Service: Cardiovascular;   Laterality: N/A;  . RADIAL ARTERY HARVEST Left 08/22/2020   Procedure: LEFT RADIAL ARTERY HARVEST;  Surgeon: Delight Ovens, MD;  Location: [facility] OR;  Service: Open Heart Surgery;  Laterality: Left;  . TEE WITHOUT CARDIOVERSION N/A 08/22/2020   Procedure: TRANSESOPHAGEAL ECHOCARDIOGRAM (TEE);  Surgeon: Delight Ovens, MD;  Location: [facility] OR;  Service: Open Heart Surgery;  Laterality: N/A;    Current Medications: No outpatient medications have been marked as taking for the 12/11/20 encounter (Appointment) with O'Neal, Ronnald Ramp, MD.     Allergies:    Patient has no known allergies.   Social History: Social History   Socioeconomic History  . Marital status: Divorced    Spouse name: Not on file  . Number of children: Not on file  . Years of education: 110  . Highest education level: Not on file  Occupational History  . Occupation: works as Firefighter and a Customer service manager  . Smoking status: Never Smoker  . Smokeless tobacco: Never Used  Vaping Use  . Vaping Use: Not on file  Substance and Sexual Activity  . Alcohol use: Yes    Alcohol/week: 1.0 standard drink    Types: 1 Cans of beer per week    Comment: socially  . Drug use: No  . Sexual activity: Not on file  Other Topics Concern  . Not on file  Social History Narrative  . Not on file   Social Determinants of Health   Financial Resource Strain: Not on  file  Food Insecurity: Not on file  Transportation Needs: Not on file  Physical Activity: Not on file  Stress: Not on file  Social Connections: Not on file     Family History: The patient's ***family history includes Cancer in his father; Heart disease in his mother; Hypertension in an other family member; Obesity in an other family member.  ROS:   All other ROS reviewed and negative. Pertinent positives noted in the HPI.     EKGs/Labs/Other Studies Reviewed:   The following studies were personally reviewed by me today:  EKG:  EKG is *** ordered today.   The ekg ordered today demonstrates ***, and was personally reviewed by me.   TTE 08/18/2020 1. Left ventricular ejection fraction, by estimation, is 45 to 50%. The  left ventricle has mildly decreased function. The basal-to-mid lateral  wall appears moderately hypokinetic based on limited views. The rest of  the LV segments appear mildly  hypokinetic.There is mild concentric left ventricular hypertrophy. Left  ventricular diastolic parameters are consistent with Grade II diastolic  dysfunction (pseudonormalization).  2. Right ventricular systolic function is normal. The right ventricular  size is normal.  3. The mitral valve is degenerative. Mild to moderate mitral valve  regurgitation. Moderate mitral annular calcification.  4. The aortic valve is tricuspid. There is moderate calcification of the  aortic valve. There is moderate thickening of the aortic valve. Aortic  valve regurgitation is not visualized. There is moderate aortic stenosis  with AVA 1.0cm2, mean gradient  , peak velocity 2.3m/s, DOI 0.34. LVOT VTI 18.  5. The inferior vena cava is normal in size with greater than 50%  respiratory variability, suggesting right atrial pressure of 3 mmHg.   Recent Labs: 08/18/2020: B Natriuretic Peptide 85.3; TSH 0.906 08/23/2020: Magnesium 2.3 08/24/2020: ALT 19 08/27/2020: Hemoglobin 12.1; Platelets 330 08/28/2020: BUN 12; Creatinine, Ser 1.03; Potassium 4.2; Sodium 135   Recent Lipid Panel    Component Value Date/Time   CHOL 163 08/18/2020 0631   CHOL 190 10/12/2019 0925   TRIG 63 08/18/2020 0631   HDL 40 (L) 08/18/2020 0631   HDL 36 (L) 10/12/2019 0925   CHOLHDL 4.1 08/18/2020 0631   VLDL 13 08/18/2020 0631   LDLCALC 110 (H) 08/18/2020 0631   LDLCALC 137 (H) 10/12/2019 1749    Physical Exam:   VS:  There were no vitals taken for this visit.   Wt Readings from Last 3 Encounters:  11/01/20 213 lb (96.6 kg)  10/09/20 217 lb 9.5 oz (98.7 kg)  09/27/20 212 lb  (96.2 kg)    General: Well nourished, well developed, in no acute distress Head: Atraumatic, normal size  Eyes: PEERLA, EOMI  Neck: Supple, no JVD Endocrine: No thryomegaly Cardiac: Normal S1, S2; RRR; no murmurs, rubs, or gallops Lungs: Clear to auscultation bilaterally, no wheezing, rhonchi or rales  Abd: Soft, nontender, no hepatomegaly  Ext: No edema, pulses 2+ Musculoskeletal: No deformities, BUE and BLE strength normal and equal Skin: Warm and dry, no rashes   Neuro: Alert and oriented to person, place, time, and situation, CNII-XII grossly intact, no focal deficits  Psych: Normal mood and affect   ASSESSMENT:   Clevland Cork is a 55 y.o. male who presents for the following: No diagnosis found.  PLAN:   There are no diagnoses linked to this encounter.  Disposition: No follow-ups on file.  Medication Adjustments/Labs and Tests Ordered: Current medicines are reviewed at length with the patient today.  Concerns regarding medicines are  outlined above.  No orders of the defined types were placed in this encounter.  No orders of the defined types were placed in this encounter.   There are no Patient Instructions on file for this visit.   Time Spent with Patient: I have spent a total of *** minutes with patient reviewing hospital notes, telemetry, EKGs, labs and examining the patient as well as establishing an assessment and plan that was discussed with the patient.  > 50% of time was spent in direct patient care.  Signed, Lenna Gilford. Flora Lipps, MD, [facility]  [facility]  [address] [place], [place] [address] [phone]  12/09/2020 6:15 PM

## 2020-12-11 ENCOUNTER — Ambulatory Visit: Payer: Self-pay | Admitting: Cardiovascular Disease

## 2020-12-11 DIAGNOSIS — I350 Nonrheumatic aortic (valve) stenosis: Secondary | ICD-10-CM

## 2020-12-11 DIAGNOSIS — E782 Mixed hyperlipidemia: Secondary | ICD-10-CM

## 2020-12-11 DIAGNOSIS — I25810 Atherosclerosis of coronary artery bypass graft(s) without angina pectoris: Secondary | ICD-10-CM

## 2020-12-11 DIAGNOSIS — I10 Essential (primary) hypertension: Secondary | ICD-10-CM

## 2020-12-11 DIAGNOSIS — I255 Ischemic cardiomyopathy: Secondary | ICD-10-CM

## 2020-12-25 MED FILL — Amiodarone HCl Tab 200 MG: ORAL | 30 days supply | Qty: 30 | Fill #0 | Status: AC

## 2020-12-25 MED FILL — Metformin HCl Tab 1000 MG: ORAL | 30 days supply | Qty: 60 | Fill #0 | Status: AC

## 2020-12-25 MED FILL — Glipizide Tab 5 MG: ORAL | 30 days supply | Qty: 60 | Fill #0 | Status: AC

## 2020-12-26 ENCOUNTER — Other Ambulatory Visit: Payer: Self-pay

## 2020-12-26 MED FILL — Metoprolol Tartrate Tab 25 MG: ORAL | 30 days supply | Qty: 90 | Fill #0 | Status: AC

## 2020-12-26 MED FILL — Clopidogrel Bisulfate Tab 75 MG (Base Equiv): ORAL | 30 days supply | Qty: 30 | Fill #0 | Status: AC

## 2020-12-27 ENCOUNTER — Other Ambulatory Visit: Payer: Self-pay

## 2020-12-27 ENCOUNTER — Ambulatory Visit: Payer: Self-pay | Admitting: Internal Medicine

## 2020-12-28 ENCOUNTER — Encounter (HOSPITAL_COMMUNITY)
Admission: RE | Admit: 2020-12-28 | Discharge: 2020-12-28 | Disposition: A | Discharge status: Home / Self Care | Payer: MEDICAID | Source: Ambulatory Visit | Attending: Cardiovascular Disease | Admitting: Cardiovascular Disease

## 2020-12-28 NOTE — Progress Notes (Signed)
Cardiac Rehab: Virtual Visit  Patient participates in the virtual cardiac rehab program via the [facility] app. Patient has been walking 70-90 minutes 3-4 days/week without any issues. For the past 2 weeks, patient has tried logging his exercise in the app, but the information is not being accepted. I contacted the [facility] support team regarding issue logging information and will follow-up once I hear back from them. I assured Johnny Nichols that the most important thing is that he is exercising consistently. Johnny Nichols asked if he should be trying other exercise activities, and I let him know that would be up to him, but if he doesn't have access to a gym or exercise equipment at home, walking is a great form of aerobic exercise, and he can continue his current routine. Patient's virtual cardiac rehab program is scheduled to end next month. Scheduled patient for post walk test and measurements for 01/16/21 at 0930.  [name], MS, ACSM CEP 12/28/20 [phone]

## 2020-12-31 ENCOUNTER — Ambulatory Visit: Payer: Self-pay | Attending: Internal Medicine | Admitting: Family Medicine

## 2020-12-31 ENCOUNTER — Other Ambulatory Visit: Payer: Self-pay

## 2020-12-31 ENCOUNTER — Encounter: Payer: Self-pay | Admitting: Family Medicine

## 2020-12-31 VITALS — BP 111/72 | HR 74 | Ht 69.0 in | Wt 214.2 lb

## 2020-12-31 DIAGNOSIS — I25810 Atherosclerosis of coronary artery bypass graft(s) without angina pectoris: Secondary | ICD-10-CM

## 2020-12-31 DIAGNOSIS — I4891 Unspecified atrial fibrillation: Secondary | ICD-10-CM

## 2020-12-31 DIAGNOSIS — I350 Nonrheumatic aortic (valve) stenosis: Secondary | ICD-10-CM

## 2020-12-31 DIAGNOSIS — E1159 Type 2 diabetes mellitus with other circulatory complications: Secondary | ICD-10-CM

## 2020-12-31 LAB — POCT GLYCOSYLATED HEMOGLOBIN (HGB A1C): HbA1c, POC (controlled diabetic range): 5.9 % (ref 0.0–7.0)

## 2020-12-31 LAB — GLUCOSE, POCT (MANUAL RESULT ENTRY): POC Glucose: 123 mg/dl — AB (ref 70–99)

## 2020-12-31 MED ORDER — ATORVASTATIN CALCIUM 80 MG PO TABS
80.0000 mg | ORAL_TABLET | Freq: Every day | ORAL | 6 refills | Status: DC
  Filled 2020-12-31: qty 30, 30d supply, fill #0
  Filled 2021-01-31: qty 30, 30d supply, fill #1
  Filled 2021-03-06: qty 30, 30d supply, fill #2
  Filled 2021-04-05: qty 30, 30d supply, fill #3
  Filled 2021-05-03: qty 30, 30d supply, fill #4
  Filled 2021-06-10: qty 30, 30d supply, fill #5
  Filled 2021-07-11: qty 30, 30d supply, fill #6

## 2020-12-31 MED FILL — Glucose Blood Test Strip: 25 days supply | Qty: 100 | Fill #0 | Status: AC

## 2020-12-31 NOTE — Progress Notes (Signed)
Subjective:  Patient ID: Johnny Nichols, male    DOB: 1966-01-21  Age: 55 y.o. MRN: [id number]  CC: Diabetes   HPI Johnny Nichols is a 55 year old male patient of Dr. Durenda Age with history of type 2 diabetes mellitus (A1c 5.9 ), coronary artery disease status post CABG x4 (in 08/2020).  He had presented with left main disease, mild to moderate aortic stenosis, new onset acute atrial fibrillation status post atrial clip and subsequently placement on amiodarone. He also underwent CABG x4 during that hospitalization. He has since followed up with Cardiology Last seen by cardiothoracic surgery in 10/2020.   Blood sugars range 89-104 fasting with a few hypoglycemic values.  He endorses compliance with his medication and attributes improvement in his A1c to his weight loss as his A1c is at 5.9 down from 7.4 previously. He has run out of atorvastatin and is requesting refills. Doing well on his other medications which he has been compliant with.  He has no additional concerns today.   Past Medical History:  Diagnosis Date  . Coronary artery disease   . Diabetes mellitus without complication ([place])   . Hyperlipidemia   . Hypertension   . Sleep apnea     Past Surgical History:  Procedure Laterality Date  . CARDIAC CATHETERIZATION    . CLIPPING OF ATRIAL APPENDAGE N/A 08/22/2020   Procedure: CLIPPING OF ATRIAL APPENDAGE USING ATRICURE 40MM ATRICLIP;  Surgeon: Grace Isaac, MD;  Location: [place];  Service: Open Heart Surgery;  Laterality: N/A;  . CORONARY ARTERY BYPASS GRAFT N/A 08/22/2020   Procedure: CORONARY ARTERY BYPASS GRAFTING (CABG) TIMES FOUR, USING LEFT INTERNAL MAMMARY ARTERY, LEFT RADIAL ARTERY, AND RIGHT LEG GREATER SAPHENOUS VEIN HARVESTED ENDOSCOPICALLY;  Surgeon: Grace Isaac, MD;  Location: [place];  Service: Open Heart Surgery;  Laterality: N/A;  . ENDOVEIN HARVEST OF GREATER SAPHENOUS VEIN Right 08/22/2020   Procedure: ENDOVEIN HARVEST OF GREATER  SAPHENOUS VEIN;  Surgeon: Grace Isaac, MD;  Location: [facility];  Service: Open Heart Surgery;  Laterality: Right;  . LEFT HEART CATH AND CORONARY ANGIOGRAPHY N/A 08/18/2020   Procedure: LEFT HEART CATH AND CORONARY ANGIOGRAPHY;  Surgeon: Jettie Booze, MD;  Location: [facility];  Service: Cardiovascular;  Laterality: N/A;  . RADIAL ARTERY HARVEST Left 08/22/2020   Procedure: LEFT RADIAL ARTERY HARVEST;  Surgeon: Grace Isaac, MD;  Location: [facility];  Service: Open Heart Surgery;  Laterality: Left;  . TEE WITHOUT CARDIOVERSION N/A 08/22/2020   Procedure: TRANSESOPHAGEAL ECHOCARDIOGRAM (TEE);  Surgeon: Grace Isaac, MD;  Location: [facility];  Service: Open Heart Surgery;  Laterality: N/A;    Family History  Problem Relation Age of Onset  . Heart disease Mother   . Cancer Father   . Hypertension Other   . Obesity Other     No Known Allergies  Outpatient Medications Prior to Visit  Medication Sig Dispense Refill  . acetaminophen (TYLENOL) 325 MG tablet Take 2 tablets (650 mg total) by mouth every 6 (six) hours as needed for mild pain.    Marland Kitchen amiodarone (PACERONE) 200 MG tablet Take 1 tablet (200 mg total) by mouth daily. Needs to be taking 200 mg once a day 60 tablet 0  . amiodarone (PACERONE) 200 MG tablet TAKE 1 TABLET (200 MG TOTAL) BY MOUTH DAILY. NEEDS TO BE TAKING 200 MG ONCE A DAY 60 tablet 0  . amiodarone (PACERONE) 200 MG tablet CONTINUE AMIO 400MG (2 TABS) 2 TIMES DAILY FOR 7 DAYS  THEN, AMIO 200MG (1 TAB) 2 TIMES DAILY UNTIL WE SEE YOU IN THE OFFICE. 90 tablet 1  . amLODipine (NORVASC) 5 MG tablet TAKE 1 TABLET (5 MG TOTAL) BY MOUTH DAILY. 30 tablet 2  . Ascorbic Acid (VITAMIN C) 1000 MG tablet Take 1,000 mg by mouth daily.    Marland Kitchen aspirin EC 81 MG EC tablet Take 1 tablet (81 mg total) by mouth daily. Swallow whole. 30 tablet 11  . atorvastatin (LIPITOR) 80 MG tablet TAKE 1 TABLET (80 MG TOTAL) BY MOUTH DAILY. 30 tablet 1  . atorvastatin (LIPITOR) 80 MG tablet  TAKE 1 TABLET (80 MG TOTAL) BY MOUTH DAILY. 30 tablet 1  . Blood Glucose Monitoring Suppl (TRUE METRIX METER) w/Device KIT Use to check blood sugar as instructed. 1 kit 0  . Cholecalciferol (VITAMIN D3 PO) Take 1 capsule by mouth daily.    . clopidogrel (PLAVIX) 75 MG tablet TAKE 1 TABLET (75 MG TOTAL) BY MOUTH DAILY. 30 tablet 1  . Dulaglutide (TRULICITY) 1.5 KP/5.3ZS SOPN Inject 1.5 mg into the skin once a week. (Patient taking differently: Inject 1.5 mg into the skin every Sunday.) 15 mL 2  . Dulaglutide 1.5 MG/0.5ML SOPN INJECT 1.5 MG INTO THE SKIN ONCE A WEEK. 7.5 mL 2  . glipiZIDE (GLUCOTROL) 5 MG tablet TAKE 1 TABLET BY MOUTH 2 (TWO) TIMES DAILY BEFORE A MEAL. (Patient taking differently: Take 5 mg by mouth 2 (two) times daily before a meal.) 60 tablet 6  . glipiZIDE (GLUCOTROL) 5 MG tablet TAKE 1 TABLET BY MOUTH 2 (TWO) TIMES DAILY BEFORE A MEAL. 60 tablet 6  . glipiZIDE (GLUCOTROL) 5 MG tablet TAKE 1 TABLET BY MOUTH 2 (TWO) TIMES DAILY BEFORE A MEAL. 60 tablet 2  . glucose blood test strip Use as instructed to check blood sugar daily. 100 each 2  . glucose blood test strip USE AS INSTRUCTED TO CHECK BLOOD SUGAR DAILY. 100 strip 2  . guaiFENesin (MUCINEX) 600 MG 12 hr tablet Take 1 tablet (600 mg total) by mouth 2 (two) times daily. (Patient not taking: Reported on 11/01/2020) 60 tablet 2  . guaiFENesin (MUCINEX) 600 MG 12 hr tablet TAKE 1 TABLET (600 MG TOTAL) BY MOUTH 2 (TWO) TIMES DAILY. 60 tablet 2  . isosorbide mononitrate (IMDUR) 30 MG 24 hr tablet TAKE 0.5 TABLETS (15 MG TOTAL) BY MOUTH DAILY. 15 tablet 0  . metFORMIN (GLUCOPHAGE) 1000 MG tablet Take 1 tablet (1,000 mg total) by mouth 2 (two) times daily with a meal. 60 tablet 6  . metFORMIN (GLUCOPHAGE) 1000 MG tablet TAKE 1 TABLET (1,000 MG TOTAL) BY MOUTH 2 (TWO) TIMES DAILY WITH A MEAL. 180 tablet 0  . metFORMIN (GLUCOPHAGE) 1000 MG tablet TAKE 1 TABLET (1,000 MG TOTAL) BY MOUTH 2 (TWO) TIMES DAILY WITH A MEAL. 60 tablet 6  .  metoprolol tartrate (LOPRESSOR) 25 MG tablet TAKE 1.5 TABLETS (37.5 MG TOTAL) BY MOUTH 2 (TWO) TIMES DAILY. 90 tablet 1  . metoprolol tartrate (LOPRESSOR) 25 MG tablet TAKE 1.5 TABLETS (37.5 MG TOTAL) BY MOUTH 2 (TWO) TIMES DAILY. 90 tablet 1  . sacubitril-valsartan (ENTRESTO) 24-26 MG Take 1 tablet by mouth 2 (two) times daily. 60 tablet 1  . sacubitril-valsartan (ENTRESTO) 24-26 MG TAKE 1 TABLET BY MOUTH 2 (TWO) TIMES DAILY. 60 tablet 1  . sildenafil (VIAGRA) 100 MG tablet TAKE 0.5-1 TABLETS (50-100 MG TOTAL) BY MOUTH DAILY AS NEEDED FOR ERECTILE DYSFUNCTION. 5 tablet 3  . sildenafil (VIAGRA) 100 MG tablet TAKE 0.5-1 TABLETS (  50-100 MG TOTAL) BY MOUTH DAILY AS NEEDED FOR ERECTILE DYSFUNCTION. 5 tablet 2  . traMADol (ULTRAM) 50 MG tablet Take 1 tablet (50 mg total) by mouth every 6 (six) hours as needed for moderate pain. 30 tablet 0  . traMADol (ULTRAM) 50 MG tablet TAKE 1 TABLET (50 MG TOTAL) BY MOUTH EVERY 6 (SIX) HOURS AS NEEDED FOR MODERATE PAIN. 30 tablet 0  . TRUEPLUS LANCETS 28G MISC Use as instructed to check blood sugar daily. 100 each 11  . atorvastatin (LIPITOR) 20 MG tablet TAKE 1 TABLET (20 MG TOTAL) BY MOUTH DAILY. STOP PRAVASTATIN 30 tablet 2  . atorvastatin (LIPITOR) 80 MG tablet TAKE 1 TABLET (80 MG TOTAL) BY MOUTH DAILY. 30 tablet 1   No facility-administered medications prior to visit.     ROS Review of Systems  Constitutional: Negative for activity change and appetite change.  HENT: Negative for sinus pressure and sore throat.   Eyes: Negative for visual disturbance.  Respiratory: Negative for cough, chest tightness and shortness of breath.   Cardiovascular: Negative for chest pain and leg swelling.  Gastrointestinal: Negative for abdominal distention, abdominal pain, constipation and diarrhea.  Endocrine: Negative.   Genitourinary: Negative for dysuria.  Musculoskeletal: Negative for joint swelling and myalgias.  Skin: Negative for rash.  Allergic/Immunologic:  Negative.   Neurological: Negative for weakness, light-headedness and numbness.  Psychiatric/Behavioral: Negative for dysphoric mood and suicidal ideas.    Objective:  BP 111/72   Pulse 74   Ht '5\' 9"'  (1.753 m)   Wt 214 lb 3.2 oz (97.2 kg)   SpO2 98%   BMI 31.63 kg/m   BP/Weight 12/31/2020 09/06/1094 0/12/5407  Systolic BP 811 914 782  Diastolic BP 72 78 72  Wt. (Lbs) 214.2 213 217.59  BMI 31.63 31.45 32.37      Physical Exam Constitutional:      Appearance: He is well-developed.  Neck:     Vascular: No JVD.  Cardiovascular:     Rate and Rhythm: Normal rate.     Heart sounds: Murmur (3/6 systolic) heard.    Pulmonary:     Effort: Pulmonary effort is normal.     Breath sounds: Normal breath sounds. No wheezing or rales.  Chest:     Chest wall: No tenderness.  Abdominal:     General: Bowel sounds are normal. There is no distension.     Palpations: Abdomen is soft. There is no mass.     Tenderness: There is no abdominal tenderness.  Musculoskeletal:        General: Normal range of motion.     Right lower leg: No edema.     Left lower leg: No edema.  Neurological:     Mental Status: He is alert and oriented to person, place, and time.  Psychiatric:        Mood and Affect: Mood normal.     CMP Latest Ref Rng & Units 08/28/2020 08/27/2020 08/26/2020  Glucose 70 - 99 mg/dL 145(H) 78 99  BUN 6 - 20 mg/dL '12 11 10  ' Creatinine 0.61 - 1.24 mg/dL 1.03 0.95 0.83  Sodium 135 - 145 mmol/L 135 139 135  Potassium 3.5 - 5.1 mmol/L 4.2 4.1 3.6  Chloride 98 - 111 mmol/L 102 101 99  CO2 22 - 32 mmol/L '24 22 25  ' Calcium 8.9 - 10.3 mg/dL 9.3 9.6 8.6(L)  Total Protein 6.5 - 8.1 g/dL - - -  Total Bilirubin 0.3 - 1.2 mg/dL - - -  Alkaline Phos 38 -  126 U/L - - -  AST 15 - 41 U/L - - -  ALT 0 - 44 U/L - - -    Lipid Panel     Component Value Date/Time   CHOL 163 08/18/2020 0631   CHOL 190 10/12/2019 0925   TRIG 63 08/18/2020 0631   HDL 40 (L) 08/18/2020 0631   HDL 36 (L)  10/12/2019 0925   CHOLHDL 4.1 08/18/2020 0631   VLDL 13 08/18/2020 0631   LDLCALC 110 (H) 08/18/2020 0631   LDLCALC 137 (H) 10/12/2019 0925    CBC    Component Value Date/Time   WBC 10.6 (H) 08/27/2020 0935   RBC 4.13 (L) 08/27/2020 0935   HGB 12.1 (L) 08/27/2020 0935   HGB 13.8 06/04/2020 1440   HCT 35.5 (L) 08/27/2020 0935   HCT 42.9 06/04/2020 1440   PLT 330 08/27/2020 0935   PLT 285 05/14/2020 1506   MCV 86.0 08/27/2020 0935   MCV 87 06/04/2020 1440   MCH 29.3 08/27/2020 0935   MCHC 34.1 08/27/2020 0935   RDW 13.2 08/27/2020 0935   RDW 12.8 06/04/2020 1440   LYMPHSABS 1.7 08/18/2020 0100   LYMPHSABS 2.2 06/04/2020 1440   MONOABS 1.0 08/18/2020 0100   EOSABS 0.4 08/18/2020 0100   EOSABS 1.2 (H) 06/04/2020 1440   BASOSABS 0.1 08/18/2020 0100   BASOSABS 0.1 06/04/2020 1440    Lab Results  Component Value Date   HGBA1C 5.9 12/31/2020    Assessment & Plan:  1. Type 2 diabetes mellitus with other circulatory complication, without long-term current use of insulin (HCC) Controlled with A1c of 5.9 which is significant improvement down from 7.4; goal is less than 7.0 Advised that if he continues to have hypoglycemic episodes he will need to cut back on his glipizide to 2.5 mg twice daily and notify PCP Continue current regimen Counseled on Diabetic diet, my plate method, 707 minutes of moderate intensity exercise/week Blood sugar logs with fasting goals of 80-120 mg/dl, random of less than 180 and in the event of sugars less than 60 mg/dl or greater than 400 mg/dl encouraged to notify the clinic. Advised on the need for annual eye exams, annual foot exams, Pneumonia vaccine. - POCT glucose (manual entry) - POCT glycosylated hemoglobin (Hb A1C) - atorvastatin (LIPITOR) 80 MG tablet; Take 1 tablet (80 mg total) by mouth daily.  Dispense: 30 tablet; Refill: 6 - CMP14+EGFR; Future - Lipid panel; Future - Microalbumin / creatinine urine ratio; Future  2. CAD of autologous  artery bypass graft without angina Status post CABG x4 Asymptomatic Risk factor management Continue  3. Aortic stenosis, moderate He is asymptomatic Continue medications Follow-up with cardiology  4.  Atrial fibrillation with RVR Occurred in the setting of left main disease Currently in sinus rhythm  Continue on amiodarone  Meds ordered this encounter  Medications  . atorvastatin (LIPITOR) 80 MG tablet    Sig: Take 1 tablet (80 mg total) by mouth daily.    Dispense:  30 tablet    Refill:  6    Follow-up: Return in about 3 months (around 04/02/2021) for follow up of chronic medical conditions with PCP Dr. Wynetta Emery.       Charlott Rakes, MD, FAAFP. [facility] and [place], [place]   12/31/2020, 4:46 PM

## 2021-01-03 ENCOUNTER — Ambulatory Visit: Payer: Self-pay | Attending: Internal Medicine

## 2021-01-03 ENCOUNTER — Other Ambulatory Visit: Payer: Self-pay

## 2021-01-03 DIAGNOSIS — E669 Obesity, unspecified: Secondary | ICD-10-CM

## 2021-01-03 DIAGNOSIS — E1169 Type 2 diabetes mellitus with other specified complication: Secondary | ICD-10-CM

## 2021-01-03 DIAGNOSIS — E1159 Type 2 diabetes mellitus with other circulatory complications: Secondary | ICD-10-CM

## 2021-01-04 ENCOUNTER — Telehealth (HOSPITAL_COMMUNITY): Payer: Self-pay | Admitting: Pharmacist

## 2021-01-04 LAB — CMP14+EGFR
ALT: 21 IU/L (ref 0–44)
AST: 19 IU/L (ref 0–40)
Albumin/Globulin Ratio: 1.4 (ref 1.2–2.2)
Albumin: 4.3 g/dL (ref 3.8–4.9)
Alkaline Phosphatase: 74 IU/L (ref 44–121)
BUN/Creatinine Ratio: 12 (ref 9–20)
BUN: 14 mg/dL (ref 6–24)
Bilirubin Total: 0.3 mg/dL (ref 0.0–1.2)
CO2: 24 mmol/L (ref 20–29)
Calcium: 9.4 mg/dL (ref 8.7–10.2)
Chloride: 102 mmol/L (ref 96–106)
Creatinine, Ser: 1.18 mg/dL (ref 0.76–1.27)
Globulin, Total: 3.1 g/dL (ref 1.5–4.5)
Glucose: 93 mg/dL (ref 65–99)
Potassium: 4.6 mmol/L (ref 3.5–5.2)
Sodium: 141 mmol/L (ref 134–144)
Total Protein: 7.4 g/dL (ref 6.0–8.5)
eGFR: 73 mL/min/{1.73_m2} (ref >59)

## 2021-01-04 LAB — MICROALBUMIN / CREATININE URINE RATIO
Creatinine, Urine: 76.6 mg/dL
Microalb/Creat Ratio: <4 mg/g creat (ref 0–29)
Microalbumin, Urine: <3 ug/mL

## 2021-01-04 LAB — LIPID PANEL
Chol/HDL Ratio: 3.7 ratio (ref 0.0–5.0)
Cholesterol, Total: 146 mg/dL (ref 100–199)
HDL: 40 mg/dL (ref >39)
LDL Chol Calc (NIH): 94 mg/dL (ref 0–99)
Triglycerides: 58 mg/dL (ref 0–149)
VLDL Cholesterol Cal: 12 mg/dL (ref 5–40)

## 2021-01-04 LAB — HEMOGLOBIN A1C
Est. average glucose Bld gHb Est-mCnc: 126 mg/dL
Hgb A1c MFr Bld: 6 % — ABNORMAL HIGH (ref 4.8–5.6)

## 2021-01-04 NOTE — Telephone Encounter (Signed)
Cardiac Rehab Medication Review by a Pharmacist  Does the patient  feel that his/her medications are working for him/her?  yes  Has the patient been experiencing any side effects to the medications prescribed?  no  Does the patient measure his/her own blood pressure or blood glucose at home?  Sometimes   Does the patient have any problems obtaining medications due to transportation or finances?   no  Understanding of regimen: good Understanding of indications: good Potential of compliance: good    Pharmacist Intervention: Spoke with patient briefly and patient could recall all medications that he is taking. No issues report by patient and indicates that his BP has been "good" on current regimen but not know an exact reading.     Isaias Sakai, PharmD, MBA Pharmacy Resident [phone] 01/04/2021 12:23 PM

## 2021-01-08 ENCOUNTER — Other Ambulatory Visit: Payer: Self-pay

## 2021-01-08 ENCOUNTER — Encounter (HOSPITAL_COMMUNITY)
Admission: RE | Admit: 2021-01-08 | Discharge: 2021-01-08 | Disposition: A | Discharge status: Home / Self Care | Payer: MEDICAID | Source: Ambulatory Visit | Attending: Cardiovascular Disease | Admitting: Cardiovascular Disease

## 2021-01-08 VITALS — BP 130/82 | HR 69 | Ht 68.75 in | Wt 217.6 lb

## 2021-01-08 DIAGNOSIS — Z951 Presence of aortocoronary bypass graft: Secondary | ICD-10-CM | POA: Insufficient documentation

## 2021-01-08 DIAGNOSIS — I214 Non-ST elevation (NSTEMI) myocardial infarction: Secondary | ICD-10-CM | POA: Insufficient documentation

## 2021-01-08 NOTE — Progress Notes (Signed)
Johnny Nichols will complete the virtual cardiac rehab program at the end of May and returns today to complete his exit walk test and measurements.Patient states that it's been harder for him to be consistent with his walking since he returned to work. Patient works in [employer], occupational and stands 6-8 hours/day. Patient's goal is to return to walking 4-5 days/week. Patient's virtual cardiac rehab report will be scanned to media for review at the completion of the program.  [name], MS, ACSM CEP 01/08/2021 1049

## 2021-01-28 NOTE — Progress Notes (Signed)
Cardiology Office Note:   Date:  01/30/2021  NAME:  Johnny Nichols    MRN: [id number] DOB:  1966/05/25   PCP:  [name], MD  Cardiologist:  Evalina Field, MD  Electrophysiologist:  None   Referring MD: [name], MD   Chief Complaint  Patient presents with  . Follow-up   History of Present Illness:   Johnny Nichols is a 55 y.o. male with a hx of NSTEMI s/p CABG, HTN, DM, HLD who presents for follow-up.  He reports he is doing well.  EKG shows he is in sinus rhythm.  He reports he is walking 4 to 5 days/week up to 4 miles per session.  No chest pain or shortness of breath.  No symptoms of congestive heart failure.  His most recent LDL cholesterol is not at goal.  We discussed continuing his Lipitor and going to a PCSK9 inhibitor.  I think he would benefit mostly from this.  He is quite young with severe CAD.  He is interested.  He has had health insurance and we will work on this.  He still has a murmur of moderate aortic stenosis.  Will need a follow-up echocardiogram 1 year.  BP well controlled.  He can stop amiodarone as he is in sinus rhythm.  He is status post left atrial appendage clipping.  Problem List 1. NSTEMI 08/2020 -s/p CABG  -LIMA-LAD; L radial artery- Diag; SVG-LCX; SVG-PDA -LAA clipping  2. Moderate AS 08/18/2020 -V max 2.7 m/s, MG 17 mmHG, AVA 1.0 cm2, DI 0.34 3. Systolic HF -EF 00% 4. DM -A1c 6.0 5. HLD -T chol 146, LDL 94, HDL 40, TG 58 6. Persistent Afib -2/2 NSTEMI -Dx 08/18/2020-> spontaneous conversion back to NSR -CHADSVASC=4 (age, DM, HTN, CHF) -s/p LAA clipping   Past Medical History: Past Medical History:  Diagnosis Date  . Coronary artery disease   . Diabetes mellitus without complication ([place])   . Hyperlipidemia   . Hypertension   . Sleep apnea     Past Surgical History: Past Surgical History:  Procedure Laterality Date  . CARDIAC CATHETERIZATION    . CLIPPING OF ATRIAL APPENDAGE N/A 08/22/2020    Procedure: CLIPPING OF ATRIAL APPENDAGE USING ATRICURE 40MM ATRICLIP;  Surgeon: Grace Isaac, MD;  Location: [facility];  Service: Open Heart Surgery;  Laterality: N/A;  . CORONARY ARTERY BYPASS GRAFT N/A 08/22/2020   Procedure: CORONARY ARTERY BYPASS GRAFTING (CABG) TIMES FOUR, USING LEFT INTERNAL MAMMARY ARTERY, LEFT RADIAL ARTERY, AND RIGHT LEG GREATER SAPHENOUS VEIN HARVESTED ENDOSCOPICALLY;  Surgeon: Grace Isaac, MD;  Location: [facility];  Service: Open Heart Surgery;  Laterality: N/A;  . ENDOVEIN HARVEST OF GREATER SAPHENOUS VEIN Right 08/22/2020   Procedure: ENDOVEIN HARVEST OF GREATER SAPHENOUS VEIN;  Surgeon: Grace Isaac, MD;  Location: [facility];  Service: Open Heart Surgery;  Laterality: Right;  . LEFT HEART CATH AND CORONARY ANGIOGRAPHY N/A 08/18/2020   Procedure: LEFT HEART CATH AND CORONARY ANGIOGRAPHY;  Surgeon: Jettie Booze, MD;  Location: [facility];  Service: Cardiovascular;  Laterality: N/A;  . RADIAL ARTERY HARVEST Left 08/22/2020   Procedure: LEFT RADIAL ARTERY HARVEST;  Surgeon: Grace Isaac, MD;  Location: [facility];  Service: Open Heart Surgery;  Laterality: Left;  . TEE WITHOUT CARDIOVERSION N/A 08/22/2020   Procedure: TRANSESOPHAGEAL ECHOCARDIOGRAM (TEE);  Surgeon: Grace Isaac, MD;  Location: [facility];  Service: Open Heart Surgery;  Laterality: N/A;    Current Medications: Current Meds  Medication Sig  . acetaminophen (TYLENOL) 325 MG tablet Take 2 tablets (650 mg total) by mouth every 6 (six) hours as needed for mild pain.  . Ascorbic Acid (VITAMIN C) 1000 MG tablet Take 1,000 mg by mouth daily.  Marland Kitchen aspirin EC 81 MG EC tablet Take 1 tablet (81 mg total) by mouth daily. Swallow whole.  Marland Kitchen atorvastatin (LIPITOR) 80 MG tablet Take 1 tablet (80 mg total) by mouth daily.  . Blood Glucose Monitoring Suppl (TRUE METRIX METER) w/Device KIT Use to check blood sugar as instructed.  . Cholecalciferol (VITAMIN D3 PO) Take 1 capsule by mouth daily.  .  clopidogrel (PLAVIX) 75 MG tablet TAKE 1 TABLET (75 MG TOTAL) BY MOUTH DAILY.  . Dulaglutide (TRULICITY) 1.55 YO/3.7CH SOPN Inject 1.5 mg into the skin once a week. (Patient taking differently: Inject 1.5 mg into the skin every Sunday.)  . glipiZIDE (GLUCOTROL) 5 MG tablet TAKE 1 TABLET BY MOUTH 2 (TWO) TIMES DAILY BEFORE A MEAL.  Marland Kitchen glucose blood test strip Use as instructed to check blood sugar daily.  . isosorbide mononitrate (IMDUR) 30 MG 24 hr tablet TAKE 0.5 TABLETS (15 MG TOTAL) BY MOUTH DAILY.  . metFORMIN (GLUCOPHAGE) 1000 MG tablet TAKE 1 TABLET (1,000 MG TOTAL) BY MOUTH 2 (TWO) TIMES DAILY WITH A MEAL.  . metoprolol tartrate (LOPRESSOR) 25 MG tablet TAKE 1.5 TABLETS (37.5 MG TOTAL) BY MOUTH 2 (TWO) TIMES DAILY.  . sacubitril-valsartan (ENTRESTO) 24-26 MG TAKE 1 TABLET BY MOUTH 2 (TWO) TIMES DAILY.  Marland Kitchen TRUEPLUS LANCETS 28G MISC Use as instructed to check blood sugar daily.  . [DISCONTINUED] amiodarone (PACERONE) 200 MG tablet Take 1 tablet (200 mg total) by mouth daily. Needs to be taking 200 mg once a day     Allergies:    Patient has no known allergies.   Social History: Social History   Socioeconomic History  . Marital status: Divorced    Spouse name: Not on file  . Number of children: Not on file  . Years of education: 29  . Highest education level: Not on file  Occupational History  . Occupation: works as Technical brewer and a Barista  . Smoking status: Never Smoker  . Smokeless tobacco: Never Used  Vaping Use  . Vaping Use: Not on file  Substance and Sexual Activity  . Alcohol use: Yes    Alcohol/week: 1.0 standard drink    Types: 1 Cans of beer per week    Comment: socially  . Drug use: No  . Sexual activity: Not on file  Other Topics Concern  . Not on file  Social History Narrative  . Not on file   Social Determinants of Health   Financial Resource Strain: Not on file  Food Insecurity: Not on file  Transportation Needs: Not on file  Physical Activity:  Not on file  Stress: Not on file  Social Connections: Not on file     Family History: The patient's family history includes Cancer in his father; Heart disease in his mother; Hypertension in an other family member; Obesity in an other family member.  ROS:   All other ROS reviewed and negative. Pertinent positives noted in the HPI.     EKGs/Labs/Other Studies Reviewed:   The following studies were personally reviewed by me today:  EKG:  EKG is ordered today.  The ekg ordered today demonstrates normal sinus rhythm heart rate 78, no acute ischemic changes or evidence of infarction, and was personally reviewed by me.  Echo 08/18/2020 1. Left ventricular ejection fraction, by estimation, is 45 to 50%. The  left ventricle has mildly decreased function. The basal-to-mid lateral  wall appears moderately hypokinetic based on limited views. The rest of  the LV segments appear mildly  hypokinetic.There is mild concentric left ventricular hypertrophy. Left  ventricular diastolic parameters are consistent with Grade II diastolic  dysfunction (pseudonormalization).  2. Right ventricular systolic function is normal. The right ventricular  size is normal.  3. The mitral valve is degenerative. Mild to moderate mitral valve  regurgitation. Moderate mitral annular calcification.  4. The aortic valve is tricuspid. There is moderate calcification of the  aortic valve. There is moderate thickening of the aortic valve. Aortic  valve regurgitation is not visualized. There is moderate aortic stenosis  with AVA 1.0cm2, mean gradient  61mHg, peak velocity 2.757m, DOI 0.34. LVOT VTI 18.  5. The inferior vena cava is normal in size with greater than 50%  respiratory variability, suggesting right atrial pressure of 3 mmHg.   Recent Labs: 08/18/2020: B Natriuretic Peptide 85.3; TSH 0.906 08/23/2020: Magnesium 2.3 08/27/2020: Hemoglobin 12.1; Platelets 330 01/03/2021: ALT 21; BUN 14; Creatinine, Ser  1.18; Potassium 4.6; Sodium 141   Recent Lipid Panel    Component Value Date/Time   CHOL 146 01/03/2021 0912   TRIG 58 01/03/2021 0912   HDL 40 01/03/2021 0912   CHOLHDL 3.7 01/03/2021 0912   CHOLHDL 4.1 08/18/2020 0631   VLDL 13 08/18/2020 0631   LDLCALC 94 01/03/2021 0912    Physical Exam:   VS:  BP 132/82 (BP Location: Right Arm, Patient Position: Sitting)   Pulse 81   Ht _0  (1.727 m)   Wt 217 lb 3.2 oz (98.5 kg)   SpO2 98%   BMI 33.03 kg/m    Wt Readings from Last 3 Encounters:  01/30/21 217 lb 3.2 oz (98.5 kg)  01/08/21 217 lb 9.5 oz (98.7 kg)  12/31/20 214 lb 3.2 oz (97.2 kg)    General: Well nourished, well developed, in no acute distress Head: Atraumatic, normal size  Eyes: PEERLA, EOMI  Neck: Supple, no JVD Endocrine: No thryomegaly Cardiac: Normal S1, S2; RRR; 3/6 systolic ejection murmur, early peaking Lungs: Clear to auscultation bilaterally, no wheezing, rhonchi or rales  Abd: Soft, nontender, no hepatomegaly  Ext: No edema, pulses 2+ Musculoskeletal: No deformities, BUE and BLE strength normal and equal Skin: Warm and dry, no rashes   Neuro: Alert and oriented to person, place, time, and situation, CNII-XII grossly intact, no focal deficits  Psych: Normal mood and affect   ASSESSMENT:   AnTobiah Celestines a [age].o. male who presents for the following: 1. Coronary artery disease involving coronary bypass graft of native heart without angina pectoris   2. Mixed hyperlipidemia   3. Primary hypertension   4. Paroxysmal atrial fibrillation (HCC)   5. Aortic stenosis, moderate     PLAN:   1. Coronary artery disease involving coronary bypass graft of native heart without angina pectoris 2. Mixed hyperlipidemia -Admitted in December for non-STEMI.  Complicated by A. fib.  This A. fib resolved. -Found to have severe left main stenosis as well as three-vessel CAD.  He is status post LIMA to LAD.  Radial artery to diagonal branch.  Vein graft to  circumflex and vein graft to PDA. -EF was mildly reduced at 45%.  He was in congestive heart failure. -He is doing well.  BP well controlled.  He will continue aspirin and Plavix for 1  year.  He will continue aspirin indefinitely. -LDL cholesterol is not at goal despite Lipitor 80 mg daily.  I think he would benefit from PCSK9 inhibitor therapy.  I would like to get his LDL cholesterol less than 50.  Zetia will not do this. -He is on metoprolol, Entresto and Imdur.  He will need a repeat echocardiogram at some point.  Doing well.  No symptoms of congestive heart failure. -His A1c is at goal.  Overall doing well with that.  3. Primary hypertension -Well-controlled on current medications.  4. Paroxysmal atrial fibrillation (HCC) -History of paroxysmal A. fib during non-STEMI.  Found to have three-vessel CAD.  Maintaining sinus rhythm.  We will stop his amiodarone.  I consider this secondary A. fib.  He is awfully young. -Status post left atrial appendage clipping.  We will hold on anticoagulation for now.  If he has recurrence we can consider this.  5. Aortic stenosis, moderate -Moderate aortic stenosis on recent echocardiogram.  Replacement was not pursued at surgery.  We will have to repeat an echocardiogram in 1 year.  Disposition: No follow-ups on file.  Medication Adjustments/Labs and Tests Ordered: Current medicines are reviewed at length with the patient today.  Concerns regarding medicines are outlined above.  Orders Placed This Encounter  Procedures  . AMB Referral to [facility]   No orders of the defined types were placed in this encounter.   Patient Instructions  Medication Instructions:  Stop Amiodarone  The current medical regimen is effective;  continue present plan and medications.  *If you need a refill on your cardiac medications before your next appointment, please call your pharmacy*   Follow-Up: At [facility], you and your health needs  are our priority.  As part of our continuing mission to provide you with exceptional heart care, we have created designated Provider Care Teams.  These Care Teams include your primary Cardiologist (physician) and Advanced Practice Providers (APPs -  Physician Assistants and Nurse Practitioners) who all work together to provide you with the care you need, when you need it.  We recommend signing up for the patient portal called "MyChart".  Sign up information is provided on this After Visit Summary.  MyChart is used to connect with patients for Virtual Visits (Telemedicine).  Patients are able to view lab/test results, encounter notes, upcoming appointments, etc.  Non-urgent messages can be sent to your provider as well.   To learn more about what you can do with MyChart, go to [email or web address].    Your next appointment:   6 month(s)  The format for your next appointment:   In Person  Provider:   Eleonore Chiquito, MD   Other Instructions We will reach out to pharmacy in regards to the injectable medications. They will contact you to set up an appointment.      Time Spent with Patient: I have spent a total of 35 minutes with patient reviewing hospital notes, telemetry, EKGs, labs and examining the patient as well as establishing an assessment and plan that was discussed with the patient.  > 50% of time was spent in direct patient care.  Signed, Addison Naegeli. Audie Box, MD, [place]  [address], [place], [place] [address] [phone]  01/30/2021 9:41 AM

## 2021-01-30 ENCOUNTER — Ambulatory Visit (INDEPENDENT_AMBULATORY_CARE_PROVIDER_SITE_OTHER): Payer: Self-pay | Admitting: Cardiovascular Disease

## 2021-01-30 ENCOUNTER — Encounter: Payer: Self-pay | Admitting: Cardiovascular Disease

## 2021-01-30 ENCOUNTER — Other Ambulatory Visit: Payer: Self-pay

## 2021-01-30 VITALS — BP 132/82 | HR 81 | Ht 68.0 in | Wt 217.2 lb

## 2021-01-30 DIAGNOSIS — I25810 Atherosclerosis of coronary artery bypass graft(s) without angina pectoris: Secondary | ICD-10-CM

## 2021-01-30 DIAGNOSIS — I350 Nonrheumatic aortic (valve) stenosis: Secondary | ICD-10-CM

## 2021-01-30 DIAGNOSIS — I10 Essential (primary) hypertension: Secondary | ICD-10-CM

## 2021-01-30 DIAGNOSIS — E782 Mixed hyperlipidemia: Secondary | ICD-10-CM

## 2021-01-30 DIAGNOSIS — I480 Paroxysmal atrial fibrillation: Secondary | ICD-10-CM

## 2021-01-30 NOTE — Patient Instructions (Addendum)
Medication Instructions:  Stop Amiodarone  The current medical regimen is effective;  continue present plan and medications.  *If you need a refill on your cardiac medications before your next appointment, please call your pharmacy*   Follow-Up: At [facility], you and your health needs are our priority.  As part of our continuing mission to provide you with exceptional heart care, we have created designated Provider Care Teams.  These Care Teams include your primary Cardiologist (physician) and Advanced Practice Providers (APPs -  Physician Assistants and Nurse Practitioners) who all work together to provide you with the care you need, when you need it.  We recommend signing up for the patient portal called "MyChart".  Sign up information is provided on this After Visit Summary.  MyChart is used to connect with patients for Virtual Visits (Telemedicine).  Patients are able to view lab/test results, encounter notes, upcoming appointments, etc.  Non-urgent messages can be sent to your provider as well.   To learn more about what you can do with MyChart, go to [email or web address].    Your next appointment:   6 month(s)  The format for your next appointment:   In Person  Provider:   Lennie Odor, MD   Other Instructions We will reach out to pharmacy in regards to the injectable medications. They will contact you to set up an appointment.

## 2021-01-31 ENCOUNTER — Other Ambulatory Visit: Payer: Self-pay

## 2021-01-31 MED FILL — Metoprolol Tartrate Tab 25 MG: ORAL | 30 days supply | Qty: 90 | Fill #1 | Status: AC

## 2021-01-31 MED FILL — Metformin HCl Tab 1000 MG: ORAL | 30 days supply | Qty: 60 | Fill #1 | Status: AC

## 2021-01-31 MED FILL — Clopidogrel Bisulfate Tab 75 MG (Base Equiv): ORAL | 30 days supply | Qty: 30 | Fill #1 | Status: AC

## 2021-01-31 MED FILL — Glipizide Tab 5 MG: ORAL | 30 days supply | Qty: 60 | Fill #1 | Status: AC

## 2021-02-08 NOTE — Addendum Note (Signed)
Addended by: Derenda Fennel on: 02/08/2021 05:02 PM   Modules accepted: Orders

## 2021-02-26 NOTE — Addendum Note (Signed)
Encounter addended by: Raad Clayson M on: 02/26/2021 11:46 AM  Actions taken: Episode resolved

## 2021-03-06 ENCOUNTER — Other Ambulatory Visit: Payer: Self-pay | Admitting: Cardiovascular Disease

## 2021-03-06 ENCOUNTER — Other Ambulatory Visit: Payer: Self-pay

## 2021-03-06 MED FILL — Glipizide Tab 5 MG: ORAL | 30 days supply | Qty: 60 | Fill #2 | Status: AC

## 2021-03-06 MED FILL — Metformin HCl Tab 1000 MG: ORAL | 30 days supply | Qty: 60 | Fill #2 | Status: AC

## 2021-03-07 ENCOUNTER — Other Ambulatory Visit: Payer: Self-pay

## 2021-03-07 MED ORDER — CLOPIDOGREL BISULFATE 75 MG PO TABS
75.0000 mg | ORAL_TABLET | Freq: Every day | ORAL | 1 refills | Status: DC
  Filled 2021-03-07: qty 30, 30d supply, fill #0
  Filled 2021-04-05: qty 30, 30d supply, fill #1

## 2021-03-07 MED ORDER — METOPROLOL TARTRATE 25 MG PO TABS
37.5000 mg | ORAL_TABLET | Freq: Two times a day (BID) | ORAL | 1 refills | Status: DC
  Filled 2021-03-07: qty 90, 30d supply, fill #0
  Filled 2021-04-05: qty 90, 30d supply, fill #1

## 2021-03-08 ENCOUNTER — Other Ambulatory Visit: Payer: Self-pay

## 2021-03-18 ENCOUNTER — Ambulatory Visit (INDEPENDENT_AMBULATORY_CARE_PROVIDER_SITE_OTHER): Payer: Self-pay | Admitting: Pharmacist Clinician (PhC)/ Clinical Pharmacy Specialist

## 2021-03-18 ENCOUNTER — Other Ambulatory Visit: Payer: Self-pay

## 2021-03-18 DIAGNOSIS — E782 Mixed hyperlipidemia: Secondary | ICD-10-CM

## 2021-03-18 NOTE — Assessment & Plan Note (Signed)
Patient with history of ASCVD and hyperlipidemia.  LDL not at goal, most recently at 94.  He continues to take atorvastatin 80 mg daily without incident.  Reviewed options for lowering LDL cholesterol, including ezetimibe, PCSK-9 inhibitors, bempedoic acid and inclisiran.  Discussed mechanisms of action, dosing, side effects and potential decreases in LDL cholesterol.  Answered all patient questions.  Based on this information, patient would prefer to start a PCSK-9 inhibitor.  Because he is uninsured, we had him fill out patient assistance from [employer] before he left the office today.  Will need to repeat lipid labs after 3 months.

## 2021-03-18 NOTE — Progress Notes (Signed)
03/18/2021 Johnny Nichols 07-03-1966 [id number]   HPI:  Johnny Nichols is a 55 y.o. male patient of Dr Audie Box, who presents today for a lipid clinic evaluation.  See pertinent past medical history below.  He was admitted to [facility] in December 2021 with NSTEMI and had CABG x 4.  He has done well since then, trying to maintain a positive lifestyle.  He does note that he is currently uninsured, and gets his Delene Loll and Musician through [employer].    Past Medical History: ASCVD NSTEMI 12/21 - severe left main stenosis, CABG x 4   hypertension Well controlled with current medications  DM2 A1c 6.0 (was 14.4 at dx 8 years ago) - on metformin, glipizide, Trulicity  Atrial fibrillation Post CABG - resolved prior to discharge  OSA    Current Medications: atorvastatin 80  Cholesterol Goals: LDL < 55  Family history: father had MI at 33, mother with pacemaker, valve replacement; mgm had stroke around 33, one brother w/new onset diabetic, palpitations, another brother with AF  Diet: works for [employer] - able to take "leftovers" home - mostly veggies; eats lots of fresh veggies, high fiber, doing keto breads, but otherise avoids breads, or high carbs.    Exercise:  tries to walk 5 miles per day, but cut back with increase work load  Labs:  TC 146, TG 58, HDL 40, LDL 94   Current Outpatient Medications  Medication Sig Dispense Refill   acetaminophen (TYLENOL) 325 MG tablet Take 2 tablets (650 mg total) by mouth every 6 (six) hours as needed for mild pain.     Ascorbic Acid (VITAMIN C) 1000 MG tablet Take 1,000 mg by mouth daily.     aspirin EC 81 MG EC tablet Take 1 tablet (81 mg total) by mouth daily. Swallow whole. 30 tablet 11   atorvastatin (LIPITOR) 80 MG tablet Take 1 tablet (80 mg total) by mouth daily. 30 tablet 6   Blood Glucose Monitoring Suppl (TRUE METRIX METER) w/Device KIT Use to check blood sugar as instructed. 1 kit 0    Cholecalciferol (VITAMIN D3 PO) Take 1 capsule by mouth daily.     clopidogrel (PLAVIX) 75 MG tablet TAKE 1 TABLET (75 MG TOTAL) BY MOUTH DAILY. 30 tablet 1   Dulaglutide (TRULICITY) 1.5 UQ/3.3HL SOPN Inject 1.5 mg into the skin once a week. (Patient taking differently: Inject 1.5 mg into the skin every Sunday.) 15 mL 2   glipiZIDE (GLUCOTROL) 5 MG tablet TAKE 1 TABLET BY MOUTH 2 (TWO) TIMES DAILY BEFORE A MEAL. 60 tablet 6   glucose blood test strip USE AS INSTRUCTED TO CHECK BLOOD SUGAR DAILY. 100 strip 2   metFORMIN (GLUCOPHAGE) 1000 MG tablet TAKE 1 TABLET (1,000 MG TOTAL) BY MOUTH 2 (TWO) TIMES DAILY WITH A MEAL. 180 tablet 0   metoprolol tartrate (LOPRESSOR) 25 MG tablet TAKE 1.5 TABLETS (37.5 MG TOTAL) BY MOUTH 2 (TWO) TIMES DAILY. 90 tablet 1   sacubitril-valsartan (ENTRESTO) 24-26 MG TAKE 1 TABLET BY MOUTH 2 (TWO) TIMES DAILY. 60 tablet 1   traMADol (ULTRAM) 50 MG tablet Take 1 tablet (50 mg total) by mouth every 6 (six) hours as needed for moderate pain. 30 tablet 0   TRUEPLUS LANCETS 28G MISC Use as instructed to check blood sugar daily. 100 each 11   No current facility-administered medications for this visit.    No Known Allergies  Past Medical History:  Diagnosis Date   Coronary artery disease    Diabetes  mellitus without complication ([place])    Hyperlipidemia    Hypertension    Sleep apnea     Blood pressure 122/84, pulse 77, resp. rate 16, height _0  (1.753 m), weight 225 lb (102.1 kg), SpO2 96 %.   Hyperlipidemia Patient with history of ASCVD and hyperlipidemia.  LDL not at goal, most recently at 94.  He continues to take atorvastatin 80 mg daily without incident.  Reviewed options for lowering LDL cholesterol, including ezetimibe, PCSK-9 inhibitors, bempedoic acid and inclisiran.  Discussed mechanisms of action, dosing, side effects and potential decreases in LDL cholesterol.  Answered all patient questions.  Based on this information, patient would prefer to start a  PCSK-9 inhibitor.  Because he is uninsured, we had him fill out patient assistance from [employer] before he left the office today.  Will need to repeat lipid labs after 3 months.    Tommy Medal PharmD CPP [facility] [address] [place], [place] [address] [phone]

## 2021-03-18 NOTE — Patient Instructions (Signed)
Your Results:             Your most recent labs Goal  Total Cholesterol 146 < 200  Triglycerides 58 < 150  HDL (happy/good cholesterol) 40 > 40  LDL (lousy/bad cholesterol 94 < 55   Medication changes:  We will submit the paperwork to get Repatha at no charge from the manufacturer.  It can take 2-6 weeks to get that approved.  They will reach out to you for shipping once they approve it.    Lab orders:  You will need to repeat cholesterol labs about 2-3 months after starting injections.    Thank you for choosing [facility]

## 2021-03-28 ENCOUNTER — Telehealth: Payer: Self-pay

## 2021-03-28 DIAGNOSIS — E785 Hyperlipidemia, unspecified: Secondary | ICD-10-CM

## 2021-03-28 MED ORDER — REPATHA SURECLICK 140 MG/ML ~~LOC~~ SOAJ: 140.0000 mg | SUBCUTANEOUS | 0 refills | Status: DC

## 2021-03-28 NOTE — Telephone Encounter (Signed)
Called and spoke w/pt regarding the approval of the repatha and instructed them to call [phone] to schedule deliveries and to complete fasting labs post 4th dose and no appt was needed. Pt voiced understanding

## 2021-04-02 ENCOUNTER — Ambulatory Visit: Payer: Self-pay | Attending: Internal Medicine | Admitting: Internal Medicine

## 2021-04-02 ENCOUNTER — Encounter: Payer: Self-pay | Admitting: Internal Medicine

## 2021-04-02 ENCOUNTER — Other Ambulatory Visit: Payer: Self-pay

## 2021-04-02 VITALS — BP 116/83 | HR 62 | Resp 16 | Wt 220.8 lb

## 2021-04-02 DIAGNOSIS — E669 Obesity, unspecified: Secondary | ICD-10-CM

## 2021-04-02 DIAGNOSIS — E1169 Type 2 diabetes mellitus with other specified complication: Secondary | ICD-10-CM

## 2021-04-02 DIAGNOSIS — I350 Nonrheumatic aortic (valve) stenosis: Secondary | ICD-10-CM

## 2021-04-02 DIAGNOSIS — I25810 Atherosclerosis of coronary artery bypass graft(s) without angina pectoris: Secondary | ICD-10-CM

## 2021-04-02 DIAGNOSIS — Z2821 Immunization not carried out because of patient refusal: Secondary | ICD-10-CM

## 2021-04-02 DIAGNOSIS — Z1211 Encounter for screening for malignant neoplasm of colon: Secondary | ICD-10-CM

## 2021-04-02 DIAGNOSIS — E785 Hyperlipidemia, unspecified: Secondary | ICD-10-CM

## 2021-04-02 DIAGNOSIS — E1159 Type 2 diabetes mellitus with other circulatory complications: Secondary | ICD-10-CM

## 2021-04-02 DIAGNOSIS — I10 Essential (primary) hypertension: Secondary | ICD-10-CM

## 2021-04-02 LAB — GLUCOSE, POCT (MANUAL RESULT ENTRY): POC Glucose: 162 mg/dl — AB (ref 70–99)

## 2021-04-02 NOTE — Progress Notes (Signed)
Patient ID: Johnny Nichols, male    DOB: September 11, 1965  MRN: [id number]  CC: Diabetes   Subjective: Johnny Nichols is a 54 y.o. male who presents for chronic ds management His concerns today include: DM, HTN, OSA, HL,obesity, CAD s/p CABGx4, PAF  HYPERTENSION/CAD/ moderate Aortic stenosis Currently taking: see medication list.  Currently on aspirin, Plavix, atorvastatin, metoprolol, Repatha and Entresto.  Saw his cardiologist Dr. Davina Poke in June.  Amiodarone was discontinued.  His atrial fibrillation had occurred in the setting of acute coronary syndrome and he had left atrial appendage clipping. Med Adherence: _0  Yes    _1  No Medication side effects: _2  Yes    _3  No Adherence with salt restriction: _4  Yes    _5  No Home Monitoring?: _6  Yes    Monitoring Frequency: Daily Home BP results range: Recent blood pressure readings in his logbook are 128/77, 107/77, 119/73. SOB? _7  Yes    _8  No Chest Pain?: _9  Yes    _10  No Leg swelling?: _11  Yes    _12  No Headaches?: _13  Yes    _14  No Dizziness? _15  Yes    _16  No Comments: no bruising or bleeding on aspirin/Plavix.  According to the cardiologist note, the goal is to keep him on dual antiplatelet therapy for total of 1 year out from his CABG then after that he will continue with aspirin alone.  HL: LDL 94 on blood test done in May.  The cardiologist would like to get him less than 79.  Repatha was added to the Lipitor.  Patient reports that he is tolerating both medications without any significant side effects.  He denies any muscle cramps or aches.   DIABETES TYPE 2/obesity Last A1C:   Results for orders placed or performed in visit on 04/02/21  POCT glucose (manual entry)  Result Value Ref Range   POC Glucose 162 (A) 70 - 99 mg/dl   Lab Results  Component Value Date   HGBA1C 6.0 (H) 01/03/2021  Med Adherence:  _17  Yes -he is on Trulicity, metformin and Glucotrol Medication side effects:  _18  Yes    _19  No Home  Monitoring?  _20  Yes    _21  No Home glucose results range: Checking blood sugars daily in the mornings.  He has his blood sugar log with him.  Ranges 74-113. Numbers in 70s not often Diet Adherence: _22  Yes  - over all doing good.  Very little white carbs, eating fruits and veggies Exercise: _23  Yes  walking daily and does some lifting about 20 lbs.  Weight is down 5 pounds from last month. Hypoglycemic episodes?: _24  Yes -but not often.  Can tell when BS low Numbness of the feet? _25  Yes    _26  No Retinopathy hx? _27  Yes    _28  No Last eye exam:  03/25/21 Comments:   HM:  had [facility] exam [facility]/20[facility].  No retinopathy.  Declines COVID vaccines.   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 08/22/2020   NSTEMI (non-ST elevated myocardial infarction) ([place]) 08/18/2020   Pneumonia 08/18/2020   Atrial fibrillation with RVR ([place]) 08/18/2020   Obesity (BMI 30.0-34.9) 02/08/2018   Hyperlipidemia 10/25/2015   Essential hypertension 10/11/2015   Diabetes mellitus without complication ([place]) 40/98/1191   Dental caries 04/04/2013   OSA (obstructive sleep apnea) 03/16/2013  Current Outpatient Medications on File Prior to Visit  Medication Sig Dispense Refill   acetaminophen (TYLENOL) 325 MG tablet Take 2 tablets (650 mg total) by mouth every 6 (six) hours as needed for mild pain.     Ascorbic Acid (VITAMIN C) 1000 MG tablet Take 1,000 mg by mouth daily.     aspirin EC 81 MG EC tablet Take 1 tablet (81 mg total) by mouth daily. Swallow whole. 30 tablet 11   atorvastatin (LIPITOR) 80 MG tablet Take 1 tablet (80 mg total) by mouth daily. 30 tablet 6   Blood Glucose Monitoring Suppl (TRUE METRIX METER) w/Device KIT Use to check blood sugar as instructed. 1 kit 0   Cholecalciferol (VITAMIN D3 PO) Take 1 capsule by mouth  daily.     clopidogrel (PLAVIX) 75 MG tablet TAKE 1 TABLET (75 MG TOTAL) BY MOUTH DAILY. 30 tablet 1   Dulaglutide (TRULICITY) 1.5 TM/2.2QJ SOPN Inject 1.5 mg into the skin once a week. (Patient taking differently: Inject 1.5 mg into the skin every Sunday.) 15 mL 2   Evolocumab (REPATHA SURECLICK) 335 MG/ML SOAJ Inject 140 mg into the skin every 14 (fourteen) days. 1 mL 0   glipiZIDE (GLUCOTROL) 5 MG tablet TAKE 1 TABLET BY MOUTH 2 (TWO) TIMES DAILY BEFORE A MEAL. 60 tablet 6   glucose blood test strip USE AS INSTRUCTED TO CHECK BLOOD SUGAR DAILY. 100 strip 2   metFORMIN (GLUCOPHAGE) 1000 MG tablet TAKE 1 TABLET (1,000 MG TOTAL) BY MOUTH 2 (TWO) TIMES DAILY WITH A MEAL. 180 tablet 0   metoprolol tartrate (LOPRESSOR) 25 MG tablet TAKE 1.5 TABLETS (37.5 MG TOTAL) BY MOUTH 2 (TWO) TIMES DAILY. 90 tablet 1   sacubitril-valsartan (ENTRESTO) 24-26 MG TAKE 1 TABLET BY MOUTH 2 (TWO) TIMES DAILY. 60 tablet 1   traMADol (ULTRAM) 50 MG tablet Take 1 tablet (50 mg total) by mouth every 6 (six) hours as needed for moderate pain. 30 tablet 0   TRUEPLUS LANCETS 28G MISC Use as instructed to check blood sugar daily. 100 each 11   No current facility-administered medications on file prior to visit.    No Known Allergies  Social History   Socioeconomic History   Marital status: Divorced    Spouse name: Not on file   Number of children: Not on file   Years of education: 16   Highest education level: Not on file  Occupational History   Occupation: works as Technical brewer and a Writer  Tobacco Use   Smoking status: Never   Smokeless tobacco: Never  Scientific laboratory technician Use: Not on file  Substance and Sexual Activity   Alcohol use: Yes    Alcohol/week: 1.0 standard drink    Types: 1 Cans of beer per week    Comment: socially   Drug use: No   Sexual activity: Not on file  Other Topics Concern   Not on file  Social History Narrative   Not on file   Social Determinants of Health   Financial Resource  Strain: Not on file  Food Insecurity: Not on file  Transportation Needs: Not on file  Physical Activity: Not on file  Stress: Not on file  Social Connections: Not on file  Intimate Partner Violence: Not on file    Family History  Problem Relation Age of Onset   Heart disease Mother    Cancer Father    Hypertension Other    Obesity Other     Past Surgical History:  Procedure Laterality  Date   CARDIAC CATHETERIZATION     CLIPPING OF ATRIAL APPENDAGE N/A 08/22/2020   Procedure: CLIPPING OF ATRIAL APPENDAGE USING ATRICURE 40MM ATRICLIP;  Surgeon: Grace Isaac, MD;  Location: [place];  Service: Open Heart Surgery;  Laterality: N/A;   CORONARY ARTERY BYPASS GRAFT N/A 08/22/2020   Procedure: CORONARY ARTERY BYPASS GRAFTING (CABG) TIMES FOUR, USING LEFT INTERNAL MAMMARY ARTERY, LEFT RADIAL ARTERY, AND RIGHT LEG GREATER SAPHENOUS VEIN HARVESTED ENDOSCOPICALLY;  Surgeon: Grace Isaac, MD;  Location: [facility];  Service: Open Heart Surgery;  Laterality: N/A;   ENDOVEIN HARVEST OF GREATER SAPHENOUS VEIN Right 08/22/2020   Procedure: ENDOVEIN HARVEST OF GREATER SAPHENOUS VEIN;  Surgeon: Grace Isaac, MD;  Location: [place];  Service: Open Heart Surgery;  Laterality: Right;   LEFT HEART CATH AND CORONARY ANGIOGRAPHY N/A 08/18/2020   Procedure: LEFT HEART CATH AND CORONARY ANGIOGRAPHY;  Surgeon: Jettie Booze, MD;  Location: [facility];  Service: Cardiovascular;  Laterality: N/A;   RADIAL ARTERY HARVEST Left 08/22/2020   Procedure: LEFT RADIAL ARTERY HARVEST;  Surgeon: Grace Isaac, MD;  Location: [facility];  Service: Open Heart Surgery;  Laterality: Left;   TEE WITHOUT CARDIOVERSION N/A 08/22/2020   Procedure: TRANSESOPHAGEAL ECHOCARDIOGRAM (TEE);  Surgeon: Grace Isaac, MD;  Location: [facility];  Service: Open Heart Surgery;  Laterality: N/A;    ROS: Review of Systems Negative except as stated above  PHYSICAL EXAM: BP 116/83   Pulse 62   Resp 16   Wt 220 lb  12.8 oz (100.2 kg)   SpO2 98%   BMI 32.61 kg/m   Wt Readings from Last 3 Encounters:  04/02/21 220 lb 12.8 oz (100.2 kg)  03/18/21 225 lb (102.1 kg)  01/30/21 217 lb 3.2 oz (98.5 kg)    Physical Exam  General appearance - alert, well appearing, and in no distress Mental status - normal mood, behavior, speech, dress, motor activity, and thought processes Neck - supple, no significant adenopathy Chest - clear to auscultation, no wheezes, rales or rhonchi, symmetric air entry Heart - RRR, +SEM upper sternal border.  Radiates to LT carotid Extremities - peripheral pulses normal, no pedal edema, no clubbing or cyanosis  CMP Latest Ref Rng & Units 01/03/2021 08/28/2020 08/27/2020  Glucose 65 - 99 mg/dL 93 145(H) 78  BUN 6 - 24 mg/dL _0 Creatinine 0.76 - 1.27 mg/dL 1.18 1.03 0.95  Sodium 134 - 144 mmol/L 141 135 139  Potassium 3.5 - 5.2 mmol/L 4.6 4.2 4.1  Chloride 96 - 106 mmol/L 102 102 101  CO2 20 - 29 mmol/L _1 Calcium 8.7 - 10.2 mg/dL 9.4 9.3 9.6  Total Protein 6.0 - 8.5 g/dL 7.4 - -  Total Bilirubin 0.0 - 1.2 mg/dL 0.3 - -  Alkaline Phos 44 - 121 IU/L 74 - -  AST 0 - 40 IU/L 19 - -  ALT 0 - 44 IU/L 21 - -   Lipid Panel     Component Value Date/Time   CHOL 146 01/03/2021 0912   TRIG 58 01/03/2021 0912   HDL 40 01/03/2021 0912   CHOLHDL 3.7 01/03/2021 0912   CHOLHDL 4.1 08/18/2020 0631   VLDL 13 08/18/2020 0631   LDLCALC 94 01/03/2021 0912    CBC    Component Value Date/Time   WBC 10.6 (H) 08/27/2020 0935   RBC 4.13 (L) 08/27/2020 0935   HGB 12.1 (L) 08/27/2020 0935   HGB 13.8 06/04/2020 1440  HCT 35.5 (L) 08/27/2020 0935   HCT 42.9 06/04/2020 1440   PLT 330 08/27/2020 0935   PLT 285 05/14/2020 1506   MCV 86.0 08/27/2020 0935   MCV 87 06/04/2020 1440   MCH 29.3 08/27/2020 0935   MCHC 34.1 08/27/2020 0935   RDW 13.2 08/27/2020 0935   RDW 12.8 06/04/2020 1440   LYMPHSABS 1.7 08/18/2020 0100   LYMPHSABS 2.2 06/04/2020 1440   MONOABS 1.0  08/18/2020 0100   EOSABS 0.4 08/18/2020 0100   EOSABS 1.2 (H) 06/04/2020 1440   BASOSABS 0.1 08/18/2020 0100   BASOSABS 0.1 06/04/2020 1440   Lab Results  Component Value Date   HGBA1C 6.0 (H) 01/03/2021     ASSESSMENT AND PLAN: 1. Type 2 diabetes mellitus with other circulatory complication, without long-term current use of insulin (HCC) Blood sugars are at goal.  Encouraged him to continue healthy eating habits and regular exercise.  He will continue his current medications of metformin, Trulicity and Glucotrol.  However I have informed patient that if he starts getting frequent episodes of blood sugars falling below 80 he can cut back or stop the Glucotrol. - POCT glucose (manual entry)  2. Obesity (BMI 30.0-34.9) See #1 above  3. Essential hypertension At goal.  Continue current medications and low-salt diet.  4. Hyperlipidemia associated with type 2 diabetes mellitus ([place]) Patient will continue Lipitor and Repatha both of which she is tolerating.  Lipid profile to be rechecked on his next visit with the cardiologist.  5. Aortic stenosis, moderate Patient asymptomatic at this time.  Advised that if he develops any chest pains, shortness of breath or lower extremity edema he needs to let us know and follow-up with his cardiologist.  Cardiology plans to repeat the echo in about 1 year  6. 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  He will continue Lipitor, Repatha, Entresto, metoprolol, Plavix and aspirin.  7. COVID-19 vaccination declined Strongly advised that he get the vaccine series but patient declines  8. Screening for colon cancer - Fecal occult blood, imunochemical([facility])     Patient was given the opportunity to ask questions.  Patient verbalized understanding of the plan and was able to repeat key elements of the plan.   Orders Placed This Encounter  Procedures   Fecal occult blood,  imunochemical([facility])   POCT glucose (manual entry)     Requested Prescriptions    No prescriptions requested or ordered in this encounter    Return in about 4 months (around 08/02/2021).  Karle Plumber, MD, FACP

## 2021-04-05 ENCOUNTER — Other Ambulatory Visit: Payer: Self-pay | Admitting: Internal Medicine

## 2021-04-05 ENCOUNTER — Other Ambulatory Visit: Payer: Self-pay

## 2021-04-05 ENCOUNTER — Other Ambulatory Visit: Payer: Self-pay | Admitting: Family

## 2021-04-05 MED ORDER — GLIPIZIDE 5 MG PO TABS
ORAL_TABLET | ORAL | 6 refills | Status: DC
  Filled 2021-04-05: qty 60, 30d supply, fill #0
  Filled 2021-05-03: qty 60, 30d supply, fill #1
  Filled 2021-06-10: qty 60, 30d supply, fill #2
  Filled 2021-07-11: qty 60, 30d supply, fill #3
  Filled 2021-08-08: qty 60, 30d supply, fill #4
  Filled 2021-09-13: qty 60, 30d supply, fill #5
  Filled 2021-09-13: qty 60, 30d supply, fill #0
  Filled 2021-10-14: qty 60, 30d supply, fill #1

## 2021-04-05 MED FILL — Glucose Blood Test Strip: 25 days supply | Qty: 100 | Fill #1 | Status: AC

## 2021-04-08 ENCOUNTER — Other Ambulatory Visit: Payer: Self-pay

## 2021-04-08 MED ORDER — METFORMIN HCL 1000 MG PO TABS
ORAL_TABLET | Freq: Two times a day (BID) | ORAL | 0 refills | Status: DC
  Filled 2021-04-08: qty 60, 30d supply, fill #0
  Filled 2021-05-03: qty 60, 30d supply, fill #1
  Filled 2021-06-10: qty 60, 30d supply, fill #2

## 2021-04-12 ENCOUNTER — Other Ambulatory Visit: Payer: Self-pay

## 2021-04-22 DIAGNOSIS — Z736 Limitation of activities due to disability: Secondary | ICD-10-CM

## 2021-05-03 ENCOUNTER — Other Ambulatory Visit: Payer: Self-pay | Admitting: Internal Medicine

## 2021-05-03 ENCOUNTER — Other Ambulatory Visit: Payer: Self-pay

## 2021-05-03 MED ORDER — CLOPIDOGREL BISULFATE 75 MG PO TABS
75.0000 mg | ORAL_TABLET | Freq: Every day | ORAL | 2 refills | Status: DC
  Filled 2021-05-03: qty 30, 30d supply, fill #0
  Filled 2021-06-10: qty 30, 30d supply, fill #1
  Filled 2021-07-11: qty 30, 30d supply, fill #2

## 2021-05-03 MED ORDER — METOPROLOL TARTRATE 25 MG PO TABS
37.5000 mg | ORAL_TABLET | Freq: Two times a day (BID) | ORAL | 2 refills | Status: DC
  Filled 2021-05-03: qty 90, 30d supply, fill #0
  Filled 2021-06-10: qty 90, 30d supply, fill #1
  Filled 2021-07-11: qty 90, 30d supply, fill #2

## 2021-05-03 NOTE — Telephone Encounter (Signed)
Requested medication (s) are due for refill today - yes  Requested medication (s) are on the active medication list -yes  Future visit scheduled -yes  Last refill: 04/05/21  Notes to clinic: Request RF: Rx filled by outside provider- sent for review of request  Requested Prescriptions  Pending Prescriptions Disp Refills   clopidogrel (PLAVIX) 75 MG tablet 30 tablet 1    Sig: TAKE 1 TABLET (75 MG TOTAL) BY MOUTH DAILY.     There is no refill protocol information for this order     metoprolol tartrate (LOPRESSOR) 25 MG tablet 90 tablet 1    Sig: TAKE 1.5 TABLETS (37.5 MG TOTAL) BY MOUTH 2 (TWO) TIMES DAILY.     There is no refill protocol information for this order       Requested Prescriptions  Pending Prescriptions Disp Refills   clopidogrel (PLAVIX) 75 MG tablet 30 tablet 1    Sig: TAKE 1 TABLET (75 MG TOTAL) BY MOUTH DAILY.     There is no refill protocol information for this order     metoprolol tartrate (LOPRESSOR) 25 MG tablet 90 tablet 1    Sig: TAKE 1.5 TABLETS (37.5 MG TOTAL) BY MOUTH 2 (TWO) TIMES DAILY.     There is no refill protocol information for this order

## 2021-05-07 ENCOUNTER — Other Ambulatory Visit: Payer: Self-pay

## 2021-05-08 ENCOUNTER — Other Ambulatory Visit: Payer: Self-pay

## 2021-06-10 ENCOUNTER — Other Ambulatory Visit: Payer: Self-pay

## 2021-06-11 ENCOUNTER — Other Ambulatory Visit: Payer: Self-pay

## 2021-06-13 ENCOUNTER — Other Ambulatory Visit: Payer: Self-pay

## 2021-07-11 ENCOUNTER — Other Ambulatory Visit: Payer: Self-pay

## 2021-07-11 ENCOUNTER — Other Ambulatory Visit: Payer: Self-pay | Admitting: Internal Medicine

## 2021-07-11 MED ORDER — TRUE METRIX BLOOD GLUCOSE TEST VI STRP
ORAL_STRIP | 2 refills | Status: AC
  Filled 2021-07-11: qty 100, 25d supply, fill #0

## 2021-07-11 MED ORDER — METFORMIN HCL 1000 MG PO TABS
ORAL_TABLET | Freq: Two times a day (BID) | ORAL | 0 refills | Status: DC
  Filled 2021-07-11: qty 60, 30d supply, fill #0

## 2021-07-11 NOTE — Telephone Encounter (Signed)
Requested Prescriptions  Pending Prescriptions Disp Refills  . glucose blood (TRUE METRIX BLOOD GLUCOSE TEST) test strip 100 strip 2    Sig: USE AS INSTRUCTED TO CHECK BLOOD SUGAR DAILY.     Endocrinology: Diabetes - Testing Supplies Passed - 07/11/2021  9:13 AM      Passed - Valid encounter within last 12 months    Recent Outpatient Visits          3 months ago Type 2 diabetes mellitus with other circulatory complication, without long-term current use of insulin ([place])   [place], Neoma Laming B, MD   6 months ago Type 2 diabetes mellitus with other circulatory complication, without long-term current use of insulin ([place])   [place], En[place]ong, MD   9 months ago Hospital discharge follow-up   [place], Deborah B, MD   11 months ago Type 2 diabetes mellitus with obesity [facility])   [place], Deborah B, MD   1 year ago Controlled type 2 diabetes mellitus without complication, without long-term current use of insulin [facility])   [place], [place], NP      Future Appointments            In 3 weeks [name], MD [place]           . metFORMIN (GLUCOPHAGE) 1000 MG tablet 180 tablet 0    Sig: TAKE 1 TABLET (1,000 MG TOTAL) BY MOUTH 2 (TWO) TIMES DAILY WITH A MEAL.     Endocrinology:  Diabetes - Biguanides Failed - 07/11/2021  9:13 AM      Failed - HBA1C is between 0 and 7.9 and within 180 days    HbA1c, POC (controlled diabetic range)  Date Value Ref Range Status  12/31/2020 5.9 0.0 - 7.0 % Final   Hgb A1c MFr Bld  Date Value Ref Range Status  01/03/2021 6.0 (H) 4.8 - 5.6 % Final    Comment:             Prediabetes: 5.7 - 6.4          Diabetes: >6.4          Glycemic control for adults with diabetes: <7.0          Passed - Cr in normal range and  within 360 days    Creat  Date Value Ref Range Status  10/11/2015 0.95 0.60 - 1.35 mg/dL Final   Creatinine, Ser  Date Value Ref Range Status  01/03/2021 1.18 0.76 - 1.27 mg/dL Final   Creatinine, Urine  Date Value Ref Range Status  10/11/2015 198 20 - 370 mg/dL Final         Passed - eGFR in normal range and within 360 days    GFR, Est African American  Date Value Ref Range Status  10/11/2015 >89 >=60 mL/min Final   GFR calc Af Amer  Date Value Ref Range Status  05/14/2020 112 >59 mL/min/1.73 Final    Comment:    **[facility] currently reports eGFR in compliance with the current**   recommendations of the [employer]. [facility] will   update reporting as new guidelines are published from the NKF-ASN   Task force.    GFR, Est Non African American  Date Value Ref Range Status  10/11/2015 >89 >=60 mL/min Final    Comment:  The estimated GFR is a calculation valid for adults (>=45 years old) that uses the CKD-EPI algorithm to adjust for age and sex. It is   not to be used for children, pregnant women, hospitalized patients,    patients on dialysis, or with rapidly changing kidney function. According to the NKDEP, eGFR >89 is normal, 60-89 shows mild impairment, 30-59 shows moderate impairment, 15-29 shows severe impairment and <15 is ESRD.      GFR, Estimated  Date Value Ref Range Status  08/28/2020 >60 >60 mL/min Final    Comment:    (NOTE) Calculated using the CKD-EPI Creatinine Equation (2021)    eGFR  Date Value Ref Range Status  01/03/2021 73 >59 mL/min/1.73 Final         Passed - Valid encounter within last 6 months    Recent Outpatient Visits          3 months ago Type 2 diabetes mellitus with other circulatory complication, without long-term current use of insulin ([place])   [place], Neoma Laming B, MD   6 months ago Type 2 diabetes mellitus with other circulatory complication, without long-term current  use of insulin ([place])   [place], Enobong, MD   9 months ago Hospital discharge follow-up   [place], Deborah B, MD   11 months ago Type 2 diabetes mellitus with obesity [facility])   [place], Deborah B, MD   1 year ago Controlled type 2 diabetes mellitus without complication, without long-term current use of insulin [facility])   [place], [place], NP      Future Appointments            In 3 weeks [name], MD [place]

## 2021-07-15 ENCOUNTER — Other Ambulatory Visit: Payer: Self-pay

## 2021-08-05 ENCOUNTER — Encounter: Payer: Self-pay | Admitting: Internal Medicine

## 2021-08-05 ENCOUNTER — Ambulatory Visit: Payer: Self-pay | Attending: Internal Medicine | Admitting: Internal Medicine

## 2021-08-05 ENCOUNTER — Other Ambulatory Visit: Payer: Self-pay

## 2021-08-05 VITALS — BP 129/82 | HR 77 | Resp 16 | Wt 230.6 lb

## 2021-08-05 DIAGNOSIS — E1169 Type 2 diabetes mellitus with other specified complication: Secondary | ICD-10-CM

## 2021-08-05 DIAGNOSIS — Z23 Encounter for immunization: Secondary | ICD-10-CM

## 2021-08-05 DIAGNOSIS — I10 Essential (primary) hypertension: Secondary | ICD-10-CM

## 2021-08-05 DIAGNOSIS — Z1211 Encounter for screening for malignant neoplasm of colon: Secondary | ICD-10-CM

## 2021-08-05 DIAGNOSIS — I25810 Atherosclerosis of coronary artery bypass graft(s) without angina pectoris: Secondary | ICD-10-CM

## 2021-08-05 DIAGNOSIS — E669 Obesity, unspecified: Secondary | ICD-10-CM

## 2021-08-05 DIAGNOSIS — D649 Anemia, unspecified: Secondary | ICD-10-CM

## 2021-08-05 DIAGNOSIS — E785 Hyperlipidemia, unspecified: Secondary | ICD-10-CM

## 2021-08-05 DIAGNOSIS — Z2821 Immunization not carried out because of patient refusal: Secondary | ICD-10-CM

## 2021-08-05 DIAGNOSIS — E1159 Type 2 diabetes mellitus with other circulatory complications: Secondary | ICD-10-CM

## 2021-08-05 LAB — POCT GLYCOSYLATED HEMOGLOBIN (HGB A1C): HbA1c, POC (controlled diabetic range): 6.1 % (ref 0.0–7.0)

## 2021-08-05 LAB — GLUCOSE, POCT (MANUAL RESULT ENTRY): POC Glucose: 126 mg/dl — AB (ref 70–99)

## 2021-08-05 MED ORDER — SACUBITRIL-VALSARTAN 24-26 MG PO TABS
1.0000 | ORAL_TABLET | Freq: Two times a day (BID) | ORAL | 6 refills | Status: DC
  Filled 2021-08-05 – 2021-10-07 (×3): qty 60, 30d supply, fill #0
  Filled 2021-11-13 – 2021-11-15 (×2): qty 60, 30d supply, fill #1

## 2021-08-05 MED ORDER — METFORMIN HCL 1000 MG PO TABS
ORAL_TABLET | Freq: Two times a day (BID) | ORAL | 1 refills | Status: DC
  Filled 2021-08-05: qty 180, fill #0
  Filled 2021-08-08 – 2021-09-13 (×2): qty 60, 30d supply, fill #0
  Filled 2021-09-13 – 2021-10-14 (×2): qty 60, 30d supply, fill #1
  Filled 2021-11-13: qty 60, 30d supply, fill #2
  Filled 2021-12-18: qty 60, 30d supply, fill #3
  Filled 2022-01-17: qty 60, 30d supply, fill #4

## 2021-08-05 MED ORDER — CLOPIDOGREL BISULFATE 75 MG PO TABS
75.0000 mg | ORAL_TABLET | Freq: Every day | ORAL | 5 refills | Status: DC
  Filled 2021-08-05 – 2021-08-08 (×2): qty 30, 30d supply, fill #0
  Filled 2021-09-13: qty 30, 30d supply, fill #1
  Filled 2021-09-13: qty 30, 30d supply, fill #0
  Filled 2021-10-14: qty 30, 30d supply, fill #1
  Filled 2021-11-13: qty 30, 30d supply, fill #2
  Filled 2021-12-18: qty 30, 30d supply, fill #3
  Filled 2022-01-17: qty 30, 30d supply, fill #4

## 2021-08-05 MED ORDER — ATORVASTATIN CALCIUM 80 MG PO TABS
80.0000 mg | ORAL_TABLET | Freq: Every day | ORAL | 6 refills | Status: DC
  Filled 2021-08-05 – 2021-08-08 (×2): qty 30, 30d supply, fill #0
  Filled 2021-09-13: qty 30, 30d supply, fill #1
  Filled 2021-09-13: qty 30, 30d supply, fill #0
  Filled 2021-10-14: qty 30, 30d supply, fill #1
  Filled 2021-11-13: qty 30, 30d supply, fill #2
  Filled 2021-12-18: qty 30, 30d supply, fill #3
  Filled 2022-01-17: qty 30, 30d supply, fill #4
  Filled 2022-02-17: qty 30, 30d supply, fill #5

## 2021-08-05 MED ORDER — METOPROLOL TARTRATE 25 MG PO TABS
37.5000 mg | ORAL_TABLET | Freq: Two times a day (BID) | ORAL | 5 refills | Status: DC
  Filled 2021-08-05 – 2021-08-08 (×2): qty 90, 30d supply, fill #0
  Filled 2021-09-13: qty 90, 30d supply, fill #1
  Filled 2021-09-13: qty 90, 30d supply, fill #0
  Filled 2021-10-14: qty 90, 30d supply, fill #1
  Filled 2021-11-13: qty 90, 30d supply, fill #2
  Filled 2021-12-18: qty 90, 30d supply, fill #3
  Filled 2022-01-17: qty 90, 30d supply, fill #4

## 2021-08-05 NOTE — Patient Instructions (Signed)

## 2021-08-05 NOTE — Progress Notes (Signed)
Patient ID: Johnny Nichols, male    DOB: March 10, 1966  MRN: [id number]  CC: Diabetes   Subjective: Johnny Nichols is a 55 y.o. male who presents for chronic ds mnagement His concerns today include:  DM, HTN, OSA, HL,obesity, CAD s/p CABGx4, PAF, mod AS   DIABETES TYPE 2/Obesity Last A1C:   Results for orders placed or performed in visit on 08/05/21  POCT glucose (manual entry)  Result Value Ref Range   POC Glucose 126 (A) 70 - 99 mg/dl  POCT glycosylated hemoglobin (Hb A1C)  Result Value Ref Range   Hemoglobin A1C     HbA1c POC (<> result, manual entry)     HbA1c, POC (prediabetic range)     HbA1c, POC (controlled diabetic range) 6.1 0.0 - 7.0 %    Med Adherence:  [x] Yes -Trulicity, Metformin and Gluctrol   [] No Medication side effects:  [] Yes    [x] No Home Monitoring?  [x] Yes but not as much lately due to working up to 10-12 hrs    [] No Home glucose results range: Diet Adherence: [] Yes    [x] No - nibbles more at work.  Works for a [employer]. Exercise: [] Yes    [x] No - not in past 1 mth.  He was walking 5 miles a day.  Did walk for the 3 days when he was off for Thanksgiving.  Was up 15 lbs since 04/2021, loss 5 of it in past 2 wks Hypoglycemic episodes?: [] Yes    [] No Numbness of the feet? [] Yes    [x] No Retinopathy hx? [] Yes    [] No Last eye exam:  Comments: will be moving the end of this mth because rent went up $200/mth.  Has the end of this mth to find another place.  This plus working long hrs has been stressful  HTN/CAD/PAF/mod AS Currently taking: see medication list.  On metoprolol, atorvastatin, Plavix, Entresto, aspirin and supposed to be on Repatha. Med Adherence: [x] Yes  but out of Repatha x 1 mth.  He called the pharmacist at the cardiologist office but has not received a call back as yet.  They were getting this for him through patient assistance program. Medication side effects: [] Yes    [x] No Adherence with salt  restriction: [x] Yes    [] No Home Monitoring?: [] Yes    [] No Monitoring Frequency:  Home BP results range:  SOB? [] Yes    [] No Chest Pain?: [] Yes    [x] No Leg swelling?: [] Yes    [x] No Headaches?: [] Yes    [x] No Dizziness? [] Yes    [x] No Comments: Noted to have a normocytic anemia.  HM: Given FIT test on last visit.  Pt reports he mailed it in.  We have not received it. Declines flu shot.  Due for second Shingrix shot.  Reports having had Zostavax back in 2017 or 18. 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 08/22/2020   NSTEMI (non-ST elevated myocardial infarction) ([place]) 08/18/2020   Pneumonia 08/18/2020   Atrial fibrillation with RVR ([place]) 08/18/2020   Obesity (BMI 30.0-34.9) 02/08/2018  Hyperlipidemia 10/25/2015   Essential hypertension 10/11/2015   Diabetes mellitus without complication ([place]) 39/10/90   Dental caries 04/04/2013   OSA (obstructive sleep apnea) 03/16/2013     Current Outpatient Medications on File Prior to Visit  Medication Sig Dispense Refill   acetaminophen (TYLENOL) 325 MG tablet Take 2 tablets (650 mg total) by mouth every 6 (six) hours as needed for mild pain.     Ascorbic Acid (VITAMIN C) 1000 MG tablet Take 1,000 mg by mouth daily.     aspirin EC 81 MG EC tablet Take 1 tablet (81 mg total) by mouth daily. Swallow whole. 30 tablet 11   atorvastatin (LIPITOR) 80 MG tablet Take 1 tablet (80 mg total) by mouth daily. 30 tablet 6   Blood Glucose Monitoring Suppl (TRUE METRIX METER) w/Device KIT Use to check blood sugar as instructed. 1 kit 0   Cholecalciferol (VITAMIN D3 PO) Take 1 capsule by mouth daily.     clopidogrel (PLAVIX) 75 MG tablet TAKE 1 TABLET (75 MG TOTAL)  BY MOUTH DAILY. 30 tablet 2   Dulaglutide (TRULICITY) 1.5 ZR/0.0TM SOPN Inject 1.5 mg into the skin once a week. (Patient taking differently: Inject 1.5 mg into the skin every Sunday.) 15 mL 2   Evolocumab (REPATHA SURECLICK) 226 MG/ML SOAJ Inject 140 mg into the skin every 14 (fourteen) days. 1 mL 0   glipiZIDE (GLUCOTROL) 5 MG tablet TAKE 1 TABLET BY MOUTH 2 (TWO) TIMES DAILY BEFORE A MEAL. 60 tablet 6   glucose blood (TRUE METRIX BLOOD GLUCOSE TEST) test strip USE AS INSTRUCTED TO CHECK BLOOD SUGAR DAILY. 100 strip 2   metFORMIN (GLUCOPHAGE) 1000 MG tablet TAKE 1 TABLET (1,000 MG TOTAL) BY MOUTH 2 (TWO) TIMES DAILY WITH A MEAL. 180 tablet 0   metoprolol tartrate (LOPRESSOR) 25 MG tablet TAKE 1.5 TABLETS (37.5 MG TOTAL) BY MOUTH 2 (TWO) TIMES DAILY. 90 tablet 2   sacubitril-valsartan (ENTRESTO) 24-26 MG TAKE 1 TABLET BY MOUTH 2 (TWO) TIMES DAILY. 60 tablet 1   traMADol (ULTRAM) 50 MG tablet Take 1 tablet (50 mg total) by mouth every 6 (six) hours as needed for moderate pain. 30 tablet 0   TRUEPLUS LANCETS 28G MISC Use as instructed to check blood sugar daily. 100 each 11   No current facility-administered medications on file prior to visit.    No Known Allergies  Social History   Socioeconomic History   Marital status: Divorced    Spouse name: Not on file   Number of children: Not on file   Years of education: 16   Highest education level: Not on file  Occupational History   Occupation: works as Technical brewer and a Writer  Tobacco Use   Smoking status: Never   Smokeless tobacco: Never  Scientific laboratory technician Use: Not on file  Substance and Sexual Activity   Alcohol use: Yes    Alcohol/week: 1.0 standard drink    Types: 1 Cans of beer per week    Comment: socially   Drug use: No   Sexual activity: Not on file  Other Topics Concern   Not on file  Social History Narrative   Not on file   Social Determinants of Health   Financial Resource Strain: Not on file  Food Insecurity: Not on  file  Transportation Needs: Not on file  Physical Activity: Not on file  Stress: Not on file  Social Connections: Not on file  Intimate Partner Violence: Not on file    Family History  Problem Relation Age of Onset   Heart disease Mother    Cancer Father    Hypertension Other    Obesity Other     Past Surgical History:  Procedure Laterality Date   CARDIAC CATHETERIZATION     CLIPPING OF ATRIAL APPENDAGE N/A 08/22/2020   Procedure: CLIPPING OF ATRIAL APPENDAGE USING ATRICURE 40MM ATRICLIP;  Surgeon: Grace Isaac, MD;  Location: [facility];  Service: Open Heart Surgery;  Laterality: N/A;   CORONARY ARTERY BYPASS GRAFT N/A 08/22/2020   Procedure: CORONARY ARTERY BYPASS GRAFTING (CABG) TIMES FOUR, USING LEFT INTERNAL MAMMARY ARTERY, LEFT RADIAL ARTERY, AND RIGHT LEG GREATER SAPHENOUS VEIN HARVESTED ENDOSCOPICALLY;  Surgeon: Grace Isaac, MD;  Location: [facility];  Service: Open Heart Surgery;  Laterality: N/A;   ENDOVEIN HARVEST OF GREATER SAPHENOUS VEIN Right 08/22/2020   Procedure: ENDOVEIN HARVEST OF GREATER SAPHENOUS VEIN;  Surgeon: Grace Isaac, MD;  Location: [facility];  Service: Open Heart Surgery;  Laterality: Right;   LEFT HEART CATH AND CORONARY ANGIOGRAPHY N/A 08/18/2020   Procedure: LEFT HEART CATH AND CORONARY ANGIOGRAPHY;  Surgeon: Jettie Booze, MD;  Location: [facility];  Service: Cardiovascular;  Laterality: N/A;   RADIAL ARTERY HARVEST Left 08/22/2020   Procedure: LEFT RADIAL ARTERY HARVEST;  Surgeon: Grace Isaac, MD;  Location: [facility];  Service: Open Heart Surgery;  Laterality: Left;   TEE WITHOUT CARDIOVERSION N/A 08/22/2020   Procedure: TRANSESOPHAGEAL ECHOCARDIOGRAM (TEE);  Surgeon: Grace Isaac, MD;  Location: [facility];  Service: Open Heart Surgery;  Laterality: N/A;    ROS: Review of Systems Negative except as stated above  PHYSICAL EXAM: BP 129/82   Pulse 77   Resp 16   Wt 230 lb 9.6 oz (104.6 kg)   SpO2 97%   BMI 34.05  kg/m   Wt Readings from Last 3 Encounters:  08/05/21 230 lb 9.6 oz (104.6 kg)  04/02/21 220 lb 12.8 oz (100.2 kg)  03/18/21 225 lb (102.1 kg)    Physical Exam  General appearance - alert, well appearing, middle-age Caucasian male and in no distress Mental status - normal mood, behavior, speech, dress, motor activity, and thought processes Neck - supple, no significant adenopathy Chest - clear to auscultation, no wheezes, rales or rhonchi, symmetric air entry Heart - normal rate, regular rhythm, normal S1, S2, 2 out of 6 systolic ejection murmur heard along the left sternal border. Extremities - peripheral pulses normal, no pedal edema, no clubbing or cyanosis  CMP Latest Ref Rng & Units 01/03/2021 08/28/2020 08/27/2020  Glucose 65 - 99 mg/dL 93 145(H) 78  BUN 6 - 24 mg/dL _0 Creatinine 0.76 - 1.27 mg/dL 1.18 1.03 0.95  Sodium 134 - 144 mmol/L 141 135 139  Potassium 3.5 - 5.2 mmol/L 4.6 4.2 4.1  Chloride 96 - 106 mmol/L 102 102 101  CO2 20 - 29 mmol/L _1 Calcium 8.7 - 10.2 mg/dL 9.4 9.3 9.6  Total Protein 6.0 - 8.5 g/dL 7.4 - -  Total Bilirubin 0.0 - 1.2 mg/dL 0.3 - -  Alkaline Phos 44 - 121 IU/L 74 - -  AST 0 - 40 IU/L 19 - -  ALT 0 - 44 IU/L 21 - -   Lipid Panel     Component Value Date/Time   CHOL 146 01/03/2021 0912   TRIG 58 01/03/2021 0912   HDL 40 01/03/2021 0912   CHOLHDL 3.7 01/03/2021 0912   CHOLHDL 4.1 08/18/2020 0631  VLDL 13 08/18/2020 0631   LDLCALC 94 01/03/2021 0912    CBC    Component Value Date/Time   WBC 10.6 (H) 08/27/2020 0935   RBC 4.13 (L) 08/27/2020 0935   HGB 12.1 (L) 08/27/2020 0935   HGB 13.8 06/04/2020 1440   HCT 35.5 (L) 08/27/2020 0935   HCT 42.9 06/04/2020 1440   PLT 330 08/27/2020 0935   PLT 285 05/14/2020 1506   MCV 86.0 08/27/2020 0935   MCV 87 06/04/2020 1440   MCH 29.3 08/27/2020 0935   MCHC 34.1 08/27/2020 0935   RDW 13.2 08/27/2020 0935   RDW 12.8 06/04/2020 1440   LYMPHSABS 1.7 08/18/2020 0100   LYMPHSABS  2.2 06/04/2020 1440   MONOABS 1.0 08/18/2020 0100   EOSABS 0.4 08/18/2020 0100   EOSABS 1.2 (H) 06/04/2020 1440   BASOSABS 0.1 08/18/2020 0100   BASOSABS 0.1 06/04/2020 1440   Depression screen [facility] 2/9 08/05/2021 04/02/2021 10/09/2020  Decreased Interest 0 0 0  Down, Depressed, Hopeless 0 0 0  PHQ - 2 Score 0 0 0  Altered sleeping - - -  Tired, decreased energy - - -  Change in appetite - - -  Feeling bad or failure about yourself  - - -  Trouble concentrating - - -  Moving slowly or fidgety/restless - - -  Suicidal thoughts - - -  PHQ-9 Score - - -  Difficult doing work/chores - - -    ASSESSMENT AND PLAN: 1. Type 2 diabetes mellitus with obesity (HCC) At goal. Encouraged him to cut back on snacking on sugary foods.  He will work on trying to get the extra 10 pounds that he gained off.  Continue glipizide, metformin and Trulicity. - POCT glucose (manual entry) - POCT glycosylated hemoglobin (Hb A1C) - metFORMIN (GLUCOPHAGE) 1000 MG tablet; TAKE 1 TABLET (1,000 MG TOTAL) BY MOUTH 2 (TWO) TIMES DAILY WITH A MEAL.  Dispense: 180 tablet; Refill: 1  2. Essential hypertension Close to goal.  Continue current medications and low-salt diet. - metoprolol tartrate (LOPRESSOR) 25 MG tablet; TAKE 1.5 TABLETS (37.5 MG TOTAL) BY MOUTH 2 (TWO) TIMES DAILY.  Dispense: 90 tablet; Refill: 5 - sacubitril-valsartan (ENTRESTO) 24-26 MG; TAKE 1 TABLET BY MOUTH 2 (TWO) TIMES DAILY.  Dispense: 60 tablet; Refill: 6  3. Hyperlipidemia associated with type 2 diabetes mellitus [facility]) Patient will call his cardiologist again to let them know that he is out of the [place]. - atorvastatin (LIPITOR) 80 MG tablet; Take 1 tablet (80 mg total) by mouth daily.  Dispense: 30 tablet; Refill: 6  4. 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  Continue current medications - metoprolol tartrate (LOPRESSOR) 25 MG tablet; TAKE 1.5 TABLETS (37.5 MG TOTAL) BY MOUTH 2 (TWO) TIMES DAILY.  Dispense: 90 tablet;  Refill: 5 - atorvastatin (LIPITOR) 80 MG tablet; Take 1 tablet (80 mg total) by mouth daily.  Dispense: 30 tablet; Refill: 6 - clopidogrel (PLAVIX) 75 MG tablet; TAKE 1 TABLET (75 MG TOTAL) BY MOUTH DAILY.  Dispense: 30 tablet; Refill: 5 - sacubitril-valsartan (ENTRESTO) 24-26 MG; TAKE 1 TABLET BY MOUTH 2 (TWO) TIMES DAILY.  Dispense: 60 tablet; Refill: 6  5. Normocytic anemia - CBC  6. Screening for colon cancer We will give another fit test.  Advised patient to try to use it here today and turn it into the lab before he leaves.  Otherwise if he takes it home he should return it to the lab the same day that he uses it -  Fecal occult blood, imunochemical([facility])  7. Need for shingles vaccine Given second shot of Shingrix today.  8. Influenza vaccination declined   Patient was given the opportunity to ask questions.  Patient verbalized understanding of the plan and was able to repeat key elements of the plan.   Orders Placed This Encounter  Procedures   POCT glucose (manual entry)   POCT glycosylated hemoglobin (Hb A1C)     Requested Prescriptions    No prescriptions requested or ordered in this encounter    No follow-ups on file.  Karle Plumber, MD, FACP

## 2021-08-06 ENCOUNTER — Other Ambulatory Visit: Payer: Self-pay

## 2021-08-06 LAB — CBC
Hematocrit: 40.9 % (ref 37.5–51.0)
Hemoglobin: 14 g/dL (ref 13.0–17.7)
MCH: 29.4 pg (ref 26.6–33.0)
MCHC: 34.2 g/dL (ref 31.5–35.7)
MCV: 86 fL (ref 79–97)
Platelets: 312 10*3/uL (ref 150–450)
RBC: 4.76 x10E6/uL (ref 4.14–5.80)
RDW: 12.6 % (ref 11.6–15.4)
WBC: 8.8 10*3/uL (ref 3.4–10.8)

## 2021-08-08 ENCOUNTER — Other Ambulatory Visit: Payer: Self-pay

## 2021-08-12 ENCOUNTER — Other Ambulatory Visit: Payer: Self-pay

## 2021-09-13 ENCOUNTER — Other Ambulatory Visit: Payer: Self-pay

## 2021-09-27 ENCOUNTER — Telehealth: Payer: Self-pay | Admitting: Internal Medicine

## 2021-09-27 NOTE — Telephone Encounter (Signed)
Tresa Endo, has this patient reached out to you? Didn't know what our options were for him.

## 2021-09-27 NOTE — Telephone Encounter (Signed)
Pt is calling to report that his medication assistance through [employer] has expired for sacubitril-valsartan (ENTRESTO) 24-26 MG [[id number]]  Pt reports that he only has 8 more pills left- 8 days. Enrollment for the new year begins October 05, 2021.  Pt does not have enough medication to last until October 05, 2021.  Pt would like to know can he receive Samples to tied him over.  Please advise CB- [phone]

## 2021-09-27 NOTE — Telephone Encounter (Signed)
Pt calling to schedule appt with Johnny Nichols to recertify his orange card. Please advise CB- [phone]

## 2021-09-27 NOTE — Telephone Encounter (Signed)
Will forward to Carlos  

## 2021-09-30 ENCOUNTER — Other Ambulatory Visit: Payer: Self-pay

## 2021-10-01 ENCOUNTER — Telehealth: Payer: Self-pay | Admitting: Internal Medicine

## 2021-10-01 NOTE — Telephone Encounter (Signed)
I return Pt call, LVM to call back to schedule a financial appt 

## 2021-10-01 NOTE — Telephone Encounter (Signed)
Refills sent for this last week. I spoke to the pharmacy and we have a $10 fill ready for him. Forwarding this to our pharmacy to make sure that is still the case.

## 2021-10-01 NOTE — Telephone Encounter (Signed)
Patient called back about appt with Mikle Bosworth and he is out of [place], wants to know cn he get some until he is financials is renewed

## 2021-10-02 ENCOUNTER — Other Ambulatory Visit: Payer: Self-pay

## 2021-10-07 ENCOUNTER — Other Ambulatory Visit: Payer: Self-pay

## 2021-10-14 ENCOUNTER — Other Ambulatory Visit: Payer: Self-pay

## 2021-11-13 ENCOUNTER — Other Ambulatory Visit: Payer: Self-pay | Admitting: Internal Medicine

## 2021-11-13 ENCOUNTER — Other Ambulatory Visit: Payer: Self-pay

## 2021-11-13 DIAGNOSIS — E1169 Type 2 diabetes mellitus with other specified complication: Secondary | ICD-10-CM

## 2021-11-13 MED ORDER — GLIPIZIDE 5 MG PO TABS
ORAL_TABLET | ORAL | 0 refills | Status: DC
  Filled 2021-11-13: qty 60, 30d supply, fill #0

## 2021-11-13 MED ORDER — TRULICITY 1.5 MG/0.5ML ~~LOC~~ SOAJ
1.5000 mg | SUBCUTANEOUS | 0 refills | Status: DC
  Filled 2021-11-13: qty 2, 28d supply, fill #0

## 2021-11-15 ENCOUNTER — Other Ambulatory Visit: Payer: Self-pay

## 2021-11-18 ENCOUNTER — Other Ambulatory Visit: Payer: Self-pay

## 2021-11-22 ENCOUNTER — Other Ambulatory Visit: Payer: Self-pay | Admitting: Internal Medicine

## 2021-11-22 DIAGNOSIS — I10 Essential (primary) hypertension: Secondary | ICD-10-CM

## 2021-11-22 DIAGNOSIS — I25810 Atherosclerosis of coronary artery bypass graft(s) without angina pectoris: Secondary | ICD-10-CM

## 2021-11-22 MED ORDER — SACUBITRIL-VALSARTAN 24-26 MG PO TABS: 1.0000 | ORAL_TABLET | Freq: Two times a day (BID) | ORAL | 3 refills | Status: DC

## 2021-11-26 ENCOUNTER — Other Ambulatory Visit: Payer: Self-pay

## 2021-12-05 ENCOUNTER — Ambulatory Visit: Payer: Self-pay | Admitting: Internal Medicine

## 2021-12-17 ENCOUNTER — Other Ambulatory Visit: Payer: Self-pay

## 2021-12-18 ENCOUNTER — Other Ambulatory Visit: Payer: Self-pay | Admitting: Internal Medicine

## 2021-12-18 ENCOUNTER — Other Ambulatory Visit: Payer: Self-pay

## 2021-12-18 MED ORDER — GLIPIZIDE 5 MG PO TABS
ORAL_TABLET | ORAL | 2 refills | Status: DC
  Filled 2021-12-18: qty 60, 30d supply, fill #0
  Filled 2022-01-17: qty 60, 30d supply, fill #1
  Filled 2022-02-17: qty 60, 30d supply, fill #2

## 2021-12-18 NOTE — Telephone Encounter (Signed)
Requested medication (s) are due for refill today:   Yes ? ?Requested medication (s) are on the active medication list:   Yes ? ?Future visit scheduled:   Yes 03/11/2022 with Dr. Wynetta Emery.   Cancelled by pt appt on 12/05/2021 ? ? ?Last ordered: 11/13/2021 #60, 0 refills ? ?Returned because there is a note he must keep his upcoming appt for further refills.  It's in July.    ? ?Requested Prescriptions  ?Pending Prescriptions Disp Refills  ? glipiZIDE (GLUCOTROL) 5 MG tablet 60 tablet 0  ?  Sig: TAKE 1 TABLET BY MOUTH 2 (TWO) TIMES DAILY BEFORE A MEAL.  ?  ? Endocrinology:  Diabetes - Sulfonylureas Passed - 12/18/2021 10:32 AM  ?  ?  Passed - HBA1C is between 0 and 7.9 and within 180 days  ?  HbA1c, POC (controlled diabetic range)  ?Date Value Ref Range Status  ?08/05/2021 6.1 0.0 - 7.0 % Final  ?  ?  ?  ?  Passed - Cr in normal range and within 360 days  ?  Creat  ?Date Value Ref Range Status  ?10/11/2015 0.95 0.60 - 1.35 mg/dL Final  ? ?Creatinine, Ser  ?Date Value Ref Range Status  ?01/03/2021 1.18 0.76 - 1.27 mg/dL Final  ? ?Creatinine, Urine  ?Date Value Ref Range Status  ?10/11/2015 198 20 - 370 mg/dL Final  ?  ?  ?  ?  Passed - Valid encounter within last 6 months  ?  Recent Outpatient Visits   ? ?      ? 4 months ago Type 2 diabetes mellitus with obesity ([place])  ? [place] [name], MD  ? 8 months ago Type 2 diabetes mellitus with other circulatory complication, without long-term current use of insulin ([place])  ? [place] [name], MD  ? 11 months ago Type 2 diabetes mellitus with other circulatory complication, without long-term current use of insulin ([place])  ? [place] Charlott Rakes, MD  ? 1 year ago Hospital discharge follow-up  ? [place] Karle Plumber B, MD  ? 1 year ago Type 2 diabetes mellitus with obesity ([place])  ? [place]  [name], MD  ? ?  ?  ?Future Appointments   ? ?        ? In 2 months [name], MD [place]  ? ?  ? ? ?  ?  ?  ? ?

## 2022-01-14 IMAGING — CR DG CHEST 2V
2 series · 2 of 2 positions shown · non-contrast
Comparison: Chest radiograph dated 08/20/2020.

CLINICAL DATA: 54-year-old male with preop chest radiograph.

EXAM:
CHEST - 2 VIEW

[chest pa]
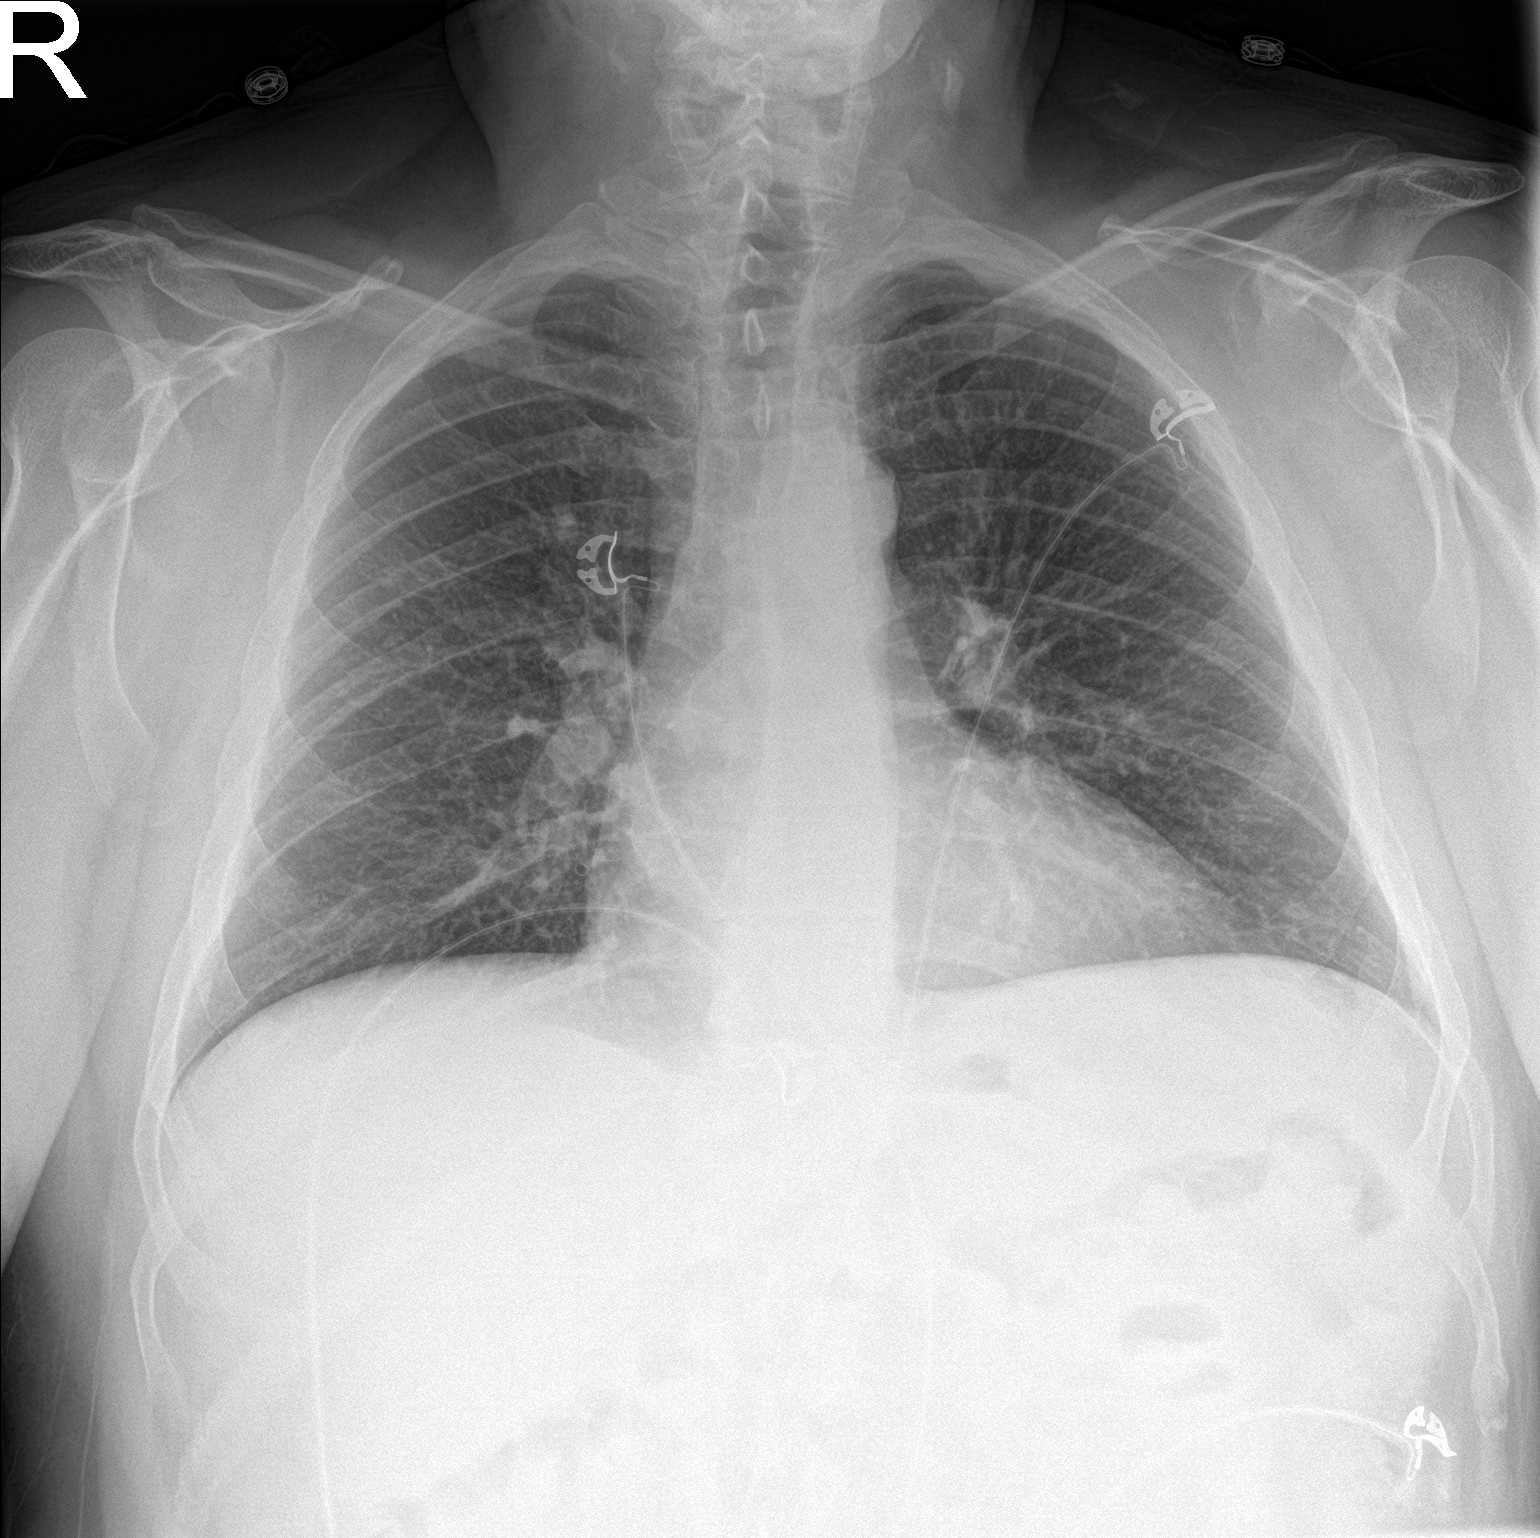

[chest lat]
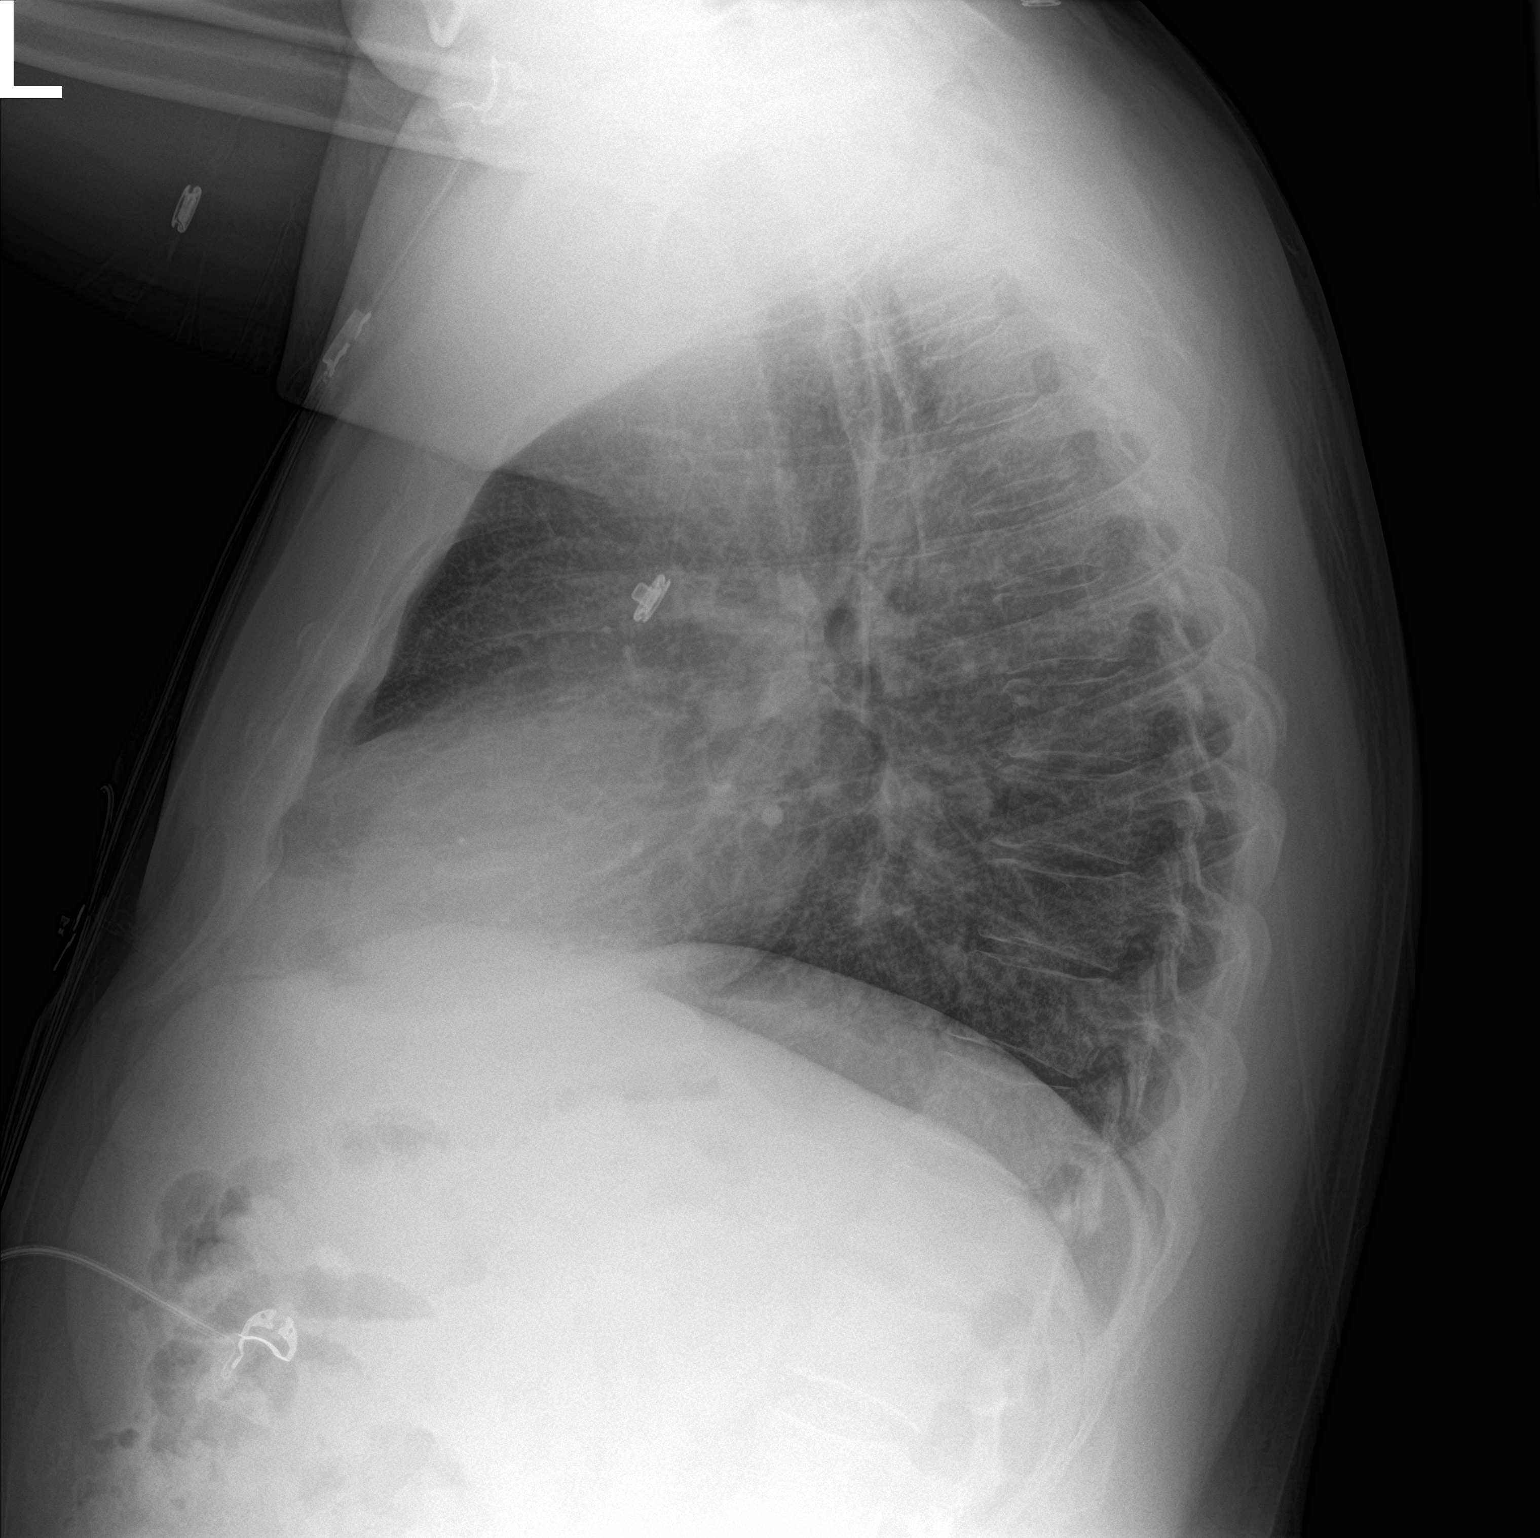

[2 of 2 positions shown; findings below may reference images not displayed]

FINDINGS: The lungs are clear. There is no pleural effusion pneumothorax. The
cardiac silhouette is within limits. No acute osseous pathology.
IMPRESSION: No active cardiopulmonary disease.

## 2022-01-16 IMAGING — DX DG CHEST 1V PORT
1 series · 1 of 1 positions shown · non-contrast
Comparison: 08/22/2020

CLINICAL DATA: Post cardiac surgery

EXAM:
PORTABLE CHEST 1 VIEW

[chest ap]
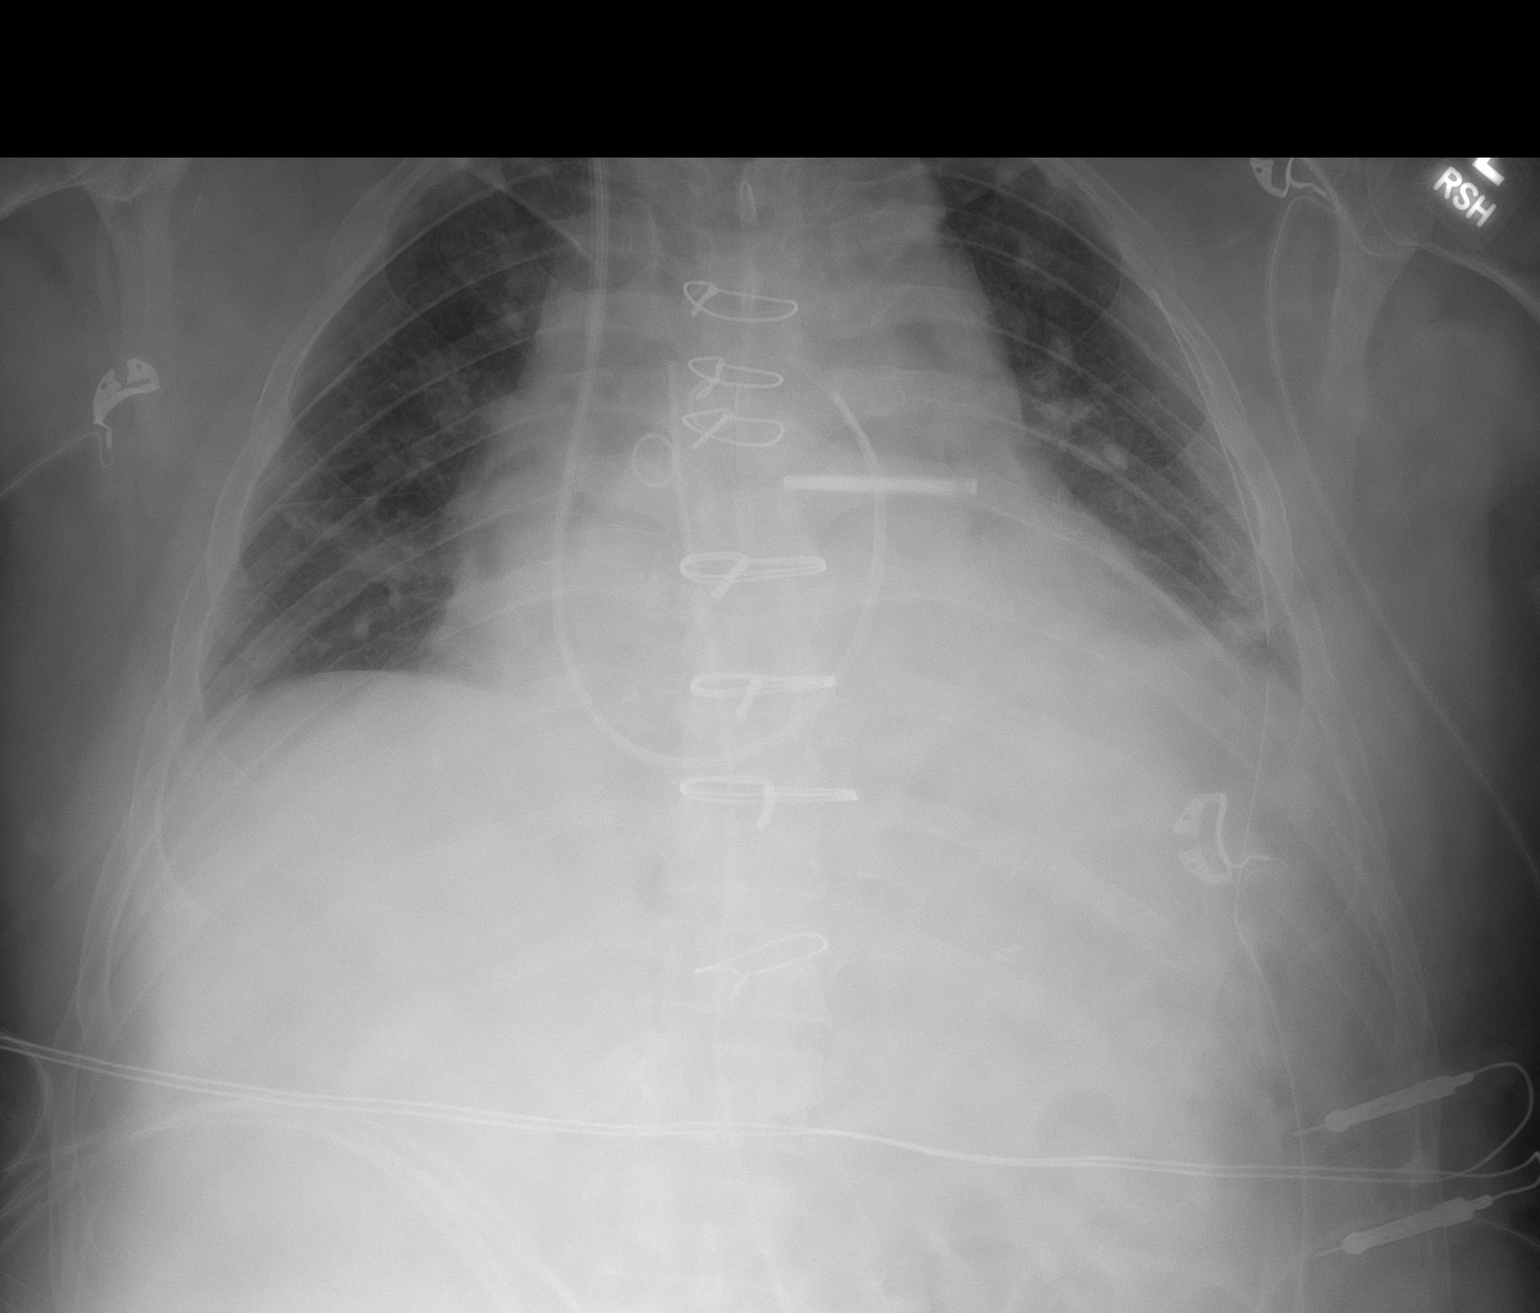

[1 of 1 positions shown; findings below may reference images not displayed]

FINDINGS: Right IJ Swan-Ganz catheter tip overlies the distal main pulmonary
artery. Mediastinal drain and left-sided chest tube are present.
Endotracheal tube and enteric tube are no longer present.

No pneumothorax. No significant pleural effusion. Left basilar
atelectasis. Stable cardiomediastinal contours.
IMPRESSION: Lines and tubes as above. No pneumothorax. Left basilar atelectasis.

## 2022-01-17 ENCOUNTER — Other Ambulatory Visit: Payer: Self-pay

## 2022-01-20 ENCOUNTER — Other Ambulatory Visit: Payer: Self-pay

## 2022-02-17 ENCOUNTER — Other Ambulatory Visit: Payer: Self-pay | Admitting: Internal Medicine

## 2022-02-17 ENCOUNTER — Other Ambulatory Visit: Payer: Self-pay

## 2022-02-17 ENCOUNTER — Other Ambulatory Visit: Payer: Self-pay | Admitting: Pharmacist Clinician (PhC)/ Clinical Pharmacy Specialist

## 2022-02-17 DIAGNOSIS — I25810 Atherosclerosis of coronary artery bypass graft(s) without angina pectoris: Secondary | ICD-10-CM

## 2022-02-17 DIAGNOSIS — E669 Obesity, unspecified: Secondary | ICD-10-CM

## 2022-02-17 DIAGNOSIS — I10 Essential (primary) hypertension: Secondary | ICD-10-CM

## 2022-02-17 MED ORDER — CLOPIDOGREL BISULFATE 75 MG PO TABS
75.0000 mg | ORAL_TABLET | Freq: Every day | ORAL | 0 refills | Status: DC
  Filled 2022-02-17: qty 30, 30d supply, fill #0

## 2022-02-17 MED ORDER — REPATHA SURECLICK 140 MG/ML ~~LOC~~ SOAJ
140.0000 mg | SUBCUTANEOUS | 11 refills | Status: DC
  Filled 2022-02-17: qty 2, fill #0

## 2022-02-17 MED ORDER — METOPROLOL TARTRATE 25 MG PO TABS
37.5000 mg | ORAL_TABLET | Freq: Two times a day (BID) | ORAL | 0 refills | Status: DC
  Filled 2022-02-17: qty 90, 30d supply, fill #0

## 2022-02-17 MED ORDER — METFORMIN HCL 1000 MG PO TABS
ORAL_TABLET | Freq: Two times a day (BID) | ORAL | 0 refills | Status: DC
  Filled 2022-02-17: qty 60, 30d supply, fill #0

## 2022-02-18 ENCOUNTER — Other Ambulatory Visit: Payer: Self-pay

## 2022-02-19 ENCOUNTER — Other Ambulatory Visit: Payer: Self-pay

## 2022-02-25 ENCOUNTER — Other Ambulatory Visit: Payer: Self-pay

## 2022-03-11 ENCOUNTER — Ambulatory Visit: Payer: MEDICAID | Attending: Internal Medicine | Admitting: Internal Medicine

## 2022-03-11 ENCOUNTER — Other Ambulatory Visit: Payer: Self-pay

## 2022-03-11 ENCOUNTER — Encounter: Payer: Self-pay | Admitting: Internal Medicine

## 2022-03-11 VITALS — BP 120/79 | HR 78 | Temp 99.1°F | Resp 16 | Wt 239.0 lb

## 2022-03-11 DIAGNOSIS — Z125 Encounter for screening for malignant neoplasm of prostate: Secondary | ICD-10-CM

## 2022-03-11 DIAGNOSIS — I350 Nonrheumatic aortic (valve) stenosis: Secondary | ICD-10-CM

## 2022-03-11 DIAGNOSIS — I25810 Atherosclerosis of coronary artery bypass graft(s) without angina pectoris: Secondary | ICD-10-CM

## 2022-03-11 DIAGNOSIS — E1159 Type 2 diabetes mellitus with other circulatory complications: Secondary | ICD-10-CM

## 2022-03-11 DIAGNOSIS — L989 Disorder of the skin and subcutaneous tissue, unspecified: Secondary | ICD-10-CM

## 2022-03-11 DIAGNOSIS — I152 Hypertension secondary to endocrine disorders: Secondary | ICD-10-CM

## 2022-03-11 DIAGNOSIS — E669 Obesity, unspecified: Secondary | ICD-10-CM

## 2022-03-11 DIAGNOSIS — Z1211 Encounter for screening for malignant neoplasm of colon: Secondary | ICD-10-CM

## 2022-03-11 DIAGNOSIS — E1169 Type 2 diabetes mellitus with other specified complication: Secondary | ICD-10-CM

## 2022-03-11 DIAGNOSIS — E785 Hyperlipidemia, unspecified: Secondary | ICD-10-CM

## 2022-03-11 LAB — POCT GLYCOSYLATED HEMOGLOBIN (HGB A1C): HbA1c, POC (prediabetic range): 6.4 % (ref 5.7–6.4)

## 2022-03-11 LAB — GLUCOSE, POCT (MANUAL RESULT ENTRY): POC Glucose: 122 mg/dl — AB (ref 70–99)

## 2022-03-11 MED ORDER — ATORVASTATIN CALCIUM 80 MG PO TABS
80.0000 mg | ORAL_TABLET | Freq: Every day | ORAL | 6 refills | Status: DC
  Filled 2022-03-11: qty 30, 30d supply, fill #0
  Filled 2022-04-21: qty 30, 30d supply, fill #1
  Filled 2022-05-16: qty 30, 30d supply, fill #2
  Filled 2022-06-20: qty 30, 30d supply, fill #3
  Filled 2022-07-22: qty 30, 30d supply, fill #4
  Filled 2022-08-20: qty 30, 30d supply, fill #5
  Filled 2022-09-23 – 2022-09-24 (×2): qty 30, 30d supply, fill #6

## 2022-03-11 MED ORDER — GLIPIZIDE 5 MG PO TABS
5.0000 mg | ORAL_TABLET | Freq: Two times a day (BID) | ORAL | 6 refills | Status: DC
  Filled 2022-03-11: qty 60, 30d supply, fill #0
  Filled 2022-04-21: qty 60, 30d supply, fill #1
  Filled 2022-05-16: qty 60, 30d supply, fill #2
  Filled 2022-06-20: qty 60, 30d supply, fill #3
  Filled 2022-07-22: qty 60, 30d supply, fill #4
  Filled 2022-08-20: qty 60, 30d supply, fill #5
  Filled 2022-09-23 – 2022-09-24 (×2): qty 60, 30d supply, fill #6

## 2022-03-11 MED ORDER — TRULICITY 1.5 MG/0.5ML ~~LOC~~ SOAJ
1.5000 mg | SUBCUTANEOUS | 6 refills | Status: DC
  Filled 2022-03-11 – 2022-05-16 (×2): qty 2, 28d supply, fill #0
  Filled 2022-06-20: qty 2, 28d supply, fill #1

## 2022-03-11 MED ORDER — CLOPIDOGREL BISULFATE 75 MG PO TABS
75.0000 mg | ORAL_TABLET | Freq: Every day | ORAL | 6 refills | Status: DC
  Filled 2022-03-11 (×2): qty 30, 30d supply, fill #0
  Filled 2022-04-21: qty 30, 30d supply, fill #1
  Filled 2022-05-16: qty 30, 30d supply, fill #2
  Filled 2022-06-20: qty 30, 30d supply, fill #3
  Filled 2022-07-22: qty 30, 30d supply, fill #4
  Filled 2022-08-20: qty 30, 30d supply, fill #5
  Filled 2022-09-23 – 2022-09-24 (×2): qty 30, 30d supply, fill #6

## 2022-03-11 MED ORDER — METFORMIN HCL 1000 MG PO TABS
ORAL_TABLET | Freq: Two times a day (BID) | ORAL | 6 refills | Status: DC
  Filled 2022-03-11: qty 60, 30d supply, fill #0
  Filled 2022-04-21: qty 60, 30d supply, fill #1
  Filled 2022-05-16: qty 60, 30d supply, fill #2
  Filled 2022-06-20: qty 60, 30d supply, fill #3
  Filled 2022-07-22: qty 60, 30d supply, fill #4
  Filled 2022-08-20 (×2): qty 60, 30d supply, fill #5
  Filled 2022-09-23 – 2022-09-24 (×2): qty 60, 30d supply, fill #6

## 2022-03-11 MED ORDER — METOPROLOL TARTRATE 25 MG PO TABS
37.5000 mg | ORAL_TABLET | Freq: Two times a day (BID) | ORAL | 6 refills | Status: DC
  Filled 2022-03-11: qty 90, 30d supply, fill #0
  Filled 2022-04-21: qty 90, 30d supply, fill #1
  Filled 2022-05-16: qty 90, 30d supply, fill #2
  Filled 2022-06-20: qty 90, 30d supply, fill #3
  Filled 2022-07-22: qty 90, 30d supply, fill #4
  Filled 2022-08-20 (×2): qty 90, 30d supply, fill #5
  Filled 2022-09-23 – 2022-09-24 (×2): qty 90, 30d supply, fill #6

## 2022-03-11 NOTE — Progress Notes (Signed)
F/u DM 

## 2022-03-11 NOTE — Progress Notes (Signed)
Patient ID: Johnny Nichols, male    DOB: 1965-10-12  MRN: [id number]  CC: Diabetes   Subjective: Johnny Nichols is a 56 y.o. male who presents for chronic ds management His concerns today include:  DM, HTN, OSA, HL,obesity, CAD s/p CABGx4, PAF, mod AS  DM: Results for orders placed or performed in visit on 03/11/22  HgB A1c  Result Value Ref Range   Hemoglobin A1C     HbA1c POC (<> result, manual entry)     HbA1c, POC (prediabetic range) 6.4 5.7 - 6.4 %   HbA1c, POC (controlled diabetic range)    Glucose (CBG)  Result Value Ref Range   POC Glucose 122 (A) 70 - 99 mg/dl   Compliant with meds which are Trulicity 1.5 mg once a week, metformin 1 g twice a day and glipizide 5 mg twice a day..   Checks BS in a.m before meals.  Gives range 59-90.  Reports BS below 80 does not occur often.  Occurs if he skips dinner the night before Wgh up 9 lbs since last visit Walking 2-3 days a wk with a weight vest on. Does good most days with eating habits but not good some.  Eats out of bordom  HTN/CAD/PAF/mod AS: Currently taking: see medication list -metoprolol 37.5 mg twice a day, atorvastatin 80 mg daily, Plavix 75 mg daily, Entresto 24/26 mg twice a day and aspirin.  Repatha is on his med list and looks like it was recently refilled by Dr. Davina Poke.  However pt states he has not been on it x 6 mths.  He was getting it through a patient program with cardiology.  However after the 6 months the company did not renew him because it was thought that by that time he should have had Medicaid. Med Adherence: '[x]'  Yes    '[]'  No Medication side effects: '[]'  Yes    '[x]'  No Adherence with salt restriction: '[x]'  Yes    '[]'  No SOB? '[]'  Yes    '[x]'  No Chest Pain?: '[]'  Yes    '[x]'  No Leg swelling?: '[]'  Yes    '[x]'  No Headaches?: '[]'  Yes    '[x]'  No Dizziness? '[]'  Yes    '[x]'  No Comments: lifted 4 gallons of liquid on a chart.  Felt like a pop or shift in chest.  Sore for several days but it is since  resolved.  Noticed small red itchy spot on the right shoulder over the deltoid times several weeks.  No initiating factors.  Wants to see a dermatologist about it as skin cancer runs in his family.  Does not have insurance or [facility] discount.  HM:  FIT given on last visit.  He has not turned this in as yet.  Tells me that he will do so.  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 08/22/2020   NSTEMI (non-ST elevated myocardial infarction) ([place]) 08/18/2020   Pneumonia 08/18/2020   Atrial fibrillation with RVR ([place]) 08/18/2020   Obesity (BMI 30.0-34.9) 02/08/2018   Hyperlipidemia 10/25/2015   Essential hypertension 10/11/2015   Diabetes mellitus without complication ([place]) 23/53/6144   Dental caries 04/04/2013   OSA (obstructive sleep apnea) 03/16/2013     Current Outpatient Medications on File Prior  to Visit  Medication Sig Dispense Refill   acetaminophen (TYLENOL) 325 MG tablet Take 2 tablets (650 mg total) by mouth every 6 (six) hours as needed for mild pain.     Ascorbic Acid (VITAMIN C) 1000 MG tablet Take 1,000 mg by mouth daily.     aspirin EC 81 MG EC tablet Take 1 tablet (81 mg total) by mouth daily. Swallow whole. 30 tablet 11   Blood Glucose Monitoring Suppl (TRUE METRIX METER) w/Device KIT Use to check blood sugar as instructed. 1 kit 0   Cholecalciferol (VITAMIN D3 PO) Take 1 capsule by mouth daily.     glucose blood (TRUE METRIX BLOOD GLUCOSE TEST) test strip USE AS INSTRUCTED TO CHECK BLOOD SUGAR DAILY. 100 strip 2   sacubitril-valsartan (ENTRESTO) 24-26 MG TAKE 1 TABLET BY MOUTH 2 (TWO) TIMES DAILY. 180 tablet 3   TRUEPLUS LANCETS 28G MISC Use as instructed to check blood sugar daily. 100 each 11    Evolocumab (REPATHA SURECLICK) 409 MG/ML SOAJ Inject 140 mg into the skin every 14 (fourteen) days. (Patient not taking: Reported on 03/11/2022) 2 mL 11   No current facility-administered medications on file prior to visit.    No Known Allergies  Social History   Socioeconomic History   Marital status: Divorced    Spouse name: Not on file   Number of children: Not on file   Years of education: 16   Highest education level: Not on file  Occupational History   Occupation: works as Technical brewer and a Writer  Tobacco Use   Smoking status: Never   Smokeless tobacco: Never  Scientific laboratory technician Use: Not on file  Substance and Sexual Activity   Alcohol use: Yes    Alcohol/week: 1.0 standard drink of alcohol    Types: 1 Cans of beer per week    Comment: socially   Drug use: No   Sexual activity: Not on file  Other Topics Concern   Not on file  Social History Narrative   Not on file   Social Determinants of Health   Financial Resource Strain: Not on file  Food Insecurity: Not on file  Transportation Needs: Not on file  Physical Activity: Not on file  Stress: Not on file  Social Connections: Not on file  Intimate Partner Violence: Not on file    Family History  Problem Relation Age of Onset   Heart disease Mother    Cancer Father    Hypertension Other    Obesity Other     Past Surgical History:  Procedure Laterality Date   CARDIAC CATHETERIZATION     CLIPPING OF ATRIAL APPENDAGE N/A 08/22/2020   Procedure: CLIPPING OF ATRIAL APPENDAGE USING ATRICURE 40MM ATRICLIP;  Surgeon: Grace Isaac, MD;  Location: [facility];  Service: Open Heart Surgery;  Laterality: N/A;   CORONARY ARTERY BYPASS GRAFT N/A 08/22/2020   Procedure: CORONARY ARTERY BYPASS GRAFTING (CABG) TIMES FOUR, USING LEFT INTERNAL MAMMARY ARTERY, LEFT RADIAL ARTERY, AND RIGHT LEG GREATER SAPHENOUS VEIN HARVESTED ENDOSCOPICALLY;  Surgeon: Grace Isaac, MD;  Location: [place];  Service: Open Heart Surgery;   Laterality: N/A;   ENDOVEIN HARVEST OF GREATER SAPHENOUS VEIN Right 08/22/2020   Procedure: ENDOVEIN HARVEST OF GREATER SAPHENOUS VEIN;  Surgeon: Grace Isaac, MD;  Location: [place];  Service: Open Heart Surgery;  Laterality: Right;   LEFT HEART CATH AND CORONARY ANGIOGRAPHY N/A 08/18/2020   Procedure: LEFT HEART CATH AND CORONARY ANGIOGRAPHY;  Surgeon: Jettie Booze, MD;  Location: [facility];  Service: Cardiovascular;  Laterality: N/A;   RADIAL ARTERY HARVEST Left 08/22/2020   Procedure: LEFT RADIAL ARTERY HARVEST;  Surgeon: Grace Isaac, MD;  Location: [place];  Service: Open Heart Surgery;  Laterality: Left;   TEE WITHOUT CARDIOVERSION N/A 08/22/2020   Procedure: TRANSESOPHAGEAL ECHOCARDIOGRAM (TEE);  Surgeon: Grace Isaac, MD;  Location: [place] [place];  Service: Open Heart Surgery;  Laterality: N/A;    ROS: Review of Systems Negative except as stated above  PHYSICAL EXAM: BP 120/79 (BP Location: Left Arm, Patient Position: Sitting, Cuff Size: Small)   Pulse 78   Temp 99.1 F (37.3 C) (Oral)   Resp 16   Wt 239 lb (108.4 kg)   SpO2 98%   BMI 35.29 kg/m   Wt Readings from Last 3 Encounters:  03/11/22 239 lb (108.4 kg)  08/05/21 230 lb 9.6 oz (104.6 kg)  04/02/21 220 lb 12.8 oz (100.2 kg)    Physical Exam  General appearance - alert, well appearing, and in no distress Mental status - normal mood, behavior, speech, dress, motor activity, and thought processes Neck - supple, no significant adenopathy Chest - clear to auscultation, no wheezes, rales or rhonchi, symmetric air entry Heart -regular rate rhythm, no gallops.  2 out of 6 diastolic murmur heard along the upper left sternal border. Extremities - peripheral pulses normal, no pedal edema, no clubbing or cyanosis Skin -about 0.5 cm macular circular spot noted over the right deltoid.  It is not tender to touch.      Latest Ref Rng & Units 01/03/2021    9:12 AM 08/28/2020    9:43 AM 08/27/2020     6:39 AM  CMP  Glucose 65 - 99 mg/dL 93  145  78   BUN 6 - 24 mg/dL '14  12  11   ' Creatinine 0.76 - 1.27 mg/dL 1.18  1.03  0.95   Sodium 134 - 144 mmol/L 141  135  139   Potassium 3.5 - 5.2 mmol/L 4.6  4.2  4.1   Chloride 96 - 106 mmol/L 102  102  101   CO2 20 - 29 mmol/L '24  24  22   ' Calcium 8.7 - 10.2 mg/dL 9.4  9.3  9.6   Total Protein 6.0 - 8.5 g/dL 7.4     Total Bilirubin 0.0 - 1.2 mg/dL 0.3     Alkaline Phos 44 - 121 IU/L 74     AST 0 - 40 IU/L 19     ALT 0 - 44 IU/L 21      Lipid Panel     Component Value Date/Time   CHOL 146 01/03/2021 0912   TRIG 58 01/03/2021 0912   HDL 40 01/03/2021 0912   CHOLHDL 3.7 01/03/2021 0912   CHOLHDL 4.1 08/18/2020 0631   VLDL 13 08/18/2020 0631   LDLCALC 94 01/03/2021 0912    CBC    Component Value Date/Time   WBC 8.8 08/05/2021 1504   WBC 10.6 (H) 08/27/2020 0935   RBC 4.76 08/05/2021 1504   RBC 4.13 (L) 08/27/2020 0935   HGB 14.0 08/05/2021 1504   HCT 40.9 08/05/2021 1504   PLT 312 08/05/2021 1504   MCV 86 08/05/2021 1504   MCH 29.4 08/05/2021 1504   MCH 29.3 08/27/2020 0935   MCHC 34.2 08/05/2021 1504   MCHC 34.1 08/27/2020 0935   RDW 12.6 08/05/2021 1504   LYMPHSABS 1.7 08/18/2020 0100   LYMPHSABS 2.2 06/04/2020 1440   MONOABS  1.0 08/18/2020 0100   EOSABS 0.4 08/18/2020 0100   EOSABS 1.2 (H) 06/04/2020 1440   BASOSABS 0.1 08/18/2020 0100   BASOSABS 0.1 06/04/2020 1440    ASSESSMENT AND PLAN:  1. Type 2 diabetes mellitus with obesity (HCC) A1c has increased a little from last visit but still at goal.  Continue metformin and Trulicity.  I recommend decreasing glipizide to 5 mg once a day given his reports of morning blood sugars sometimes in the 50s.  However patient declines stating this occurs only occasionally and when he skips dinner the night before. Discussed and encourage healthy eating habits.  Advised to keep himself busy to avoid eating out of boredom.  Encouraged him to move as much as he can. - HgB A1c -  Glucose (CBG) - metFORMIN (GLUCOPHAGE) 1000 MG tablet; TAKE 1 TABLET (1,000 MG TOTAL) BY MOUTH 2 (TWO) TIMES DAILY WITH A MEAL.  Dispense: 60 tablet; Refill: 6 - Dulaglutide (TRULICITY) 1.5 MW/4.1LK SOPN; Inject 1.5 mg into the skin once a week.  Dispense: 2 mL; Refill: 6 - CBC - Comprehensive metabolic panel - Lipid panel  2. Hypertension associated with diabetes ([place]) At goal.  Continue medications listed above. - metoprolol tartrate (LOPRESSOR) 25 MG tablet; TAKE 1.5 TABLETS (37.5 MG TOTAL) BY MOUTH 2 (TWO) TIMES DAILY.  Dispense: 90 tablet; Refill: 6  3. 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  Continue metoprolol, Lipitor, Plavix, Entresto - metoprolol tartrate (LOPRESSOR) 25 MG tablet; TAKE 1.5 TABLETS (37.5 MG TOTAL) BY MOUTH 2 (TWO) TIMES DAILY.  Dispense: 90 tablet; Refill: 6 - atorvastatin (LIPITOR) 80 MG tablet; Take 1 tablet (80 mg total) by mouth daily.  Dispense: 30 tablet; Refill: 6 - clopidogrel (PLAVIX) 75 MG tablet; TAKE 1 TABLET (75 MG TOTAL) BY MOUTH DAILY. (Must have office visit for refills)  Dispense: 30 tablet; Refill: 6  4. Hyperlipidemia associated with type 2 diabetes mellitus (HCC) - atorvastatin (LIPITOR) 80 MG tablet; Take 1 tablet (80 mg total) by mouth daily.  Dispense: 30 tablet; Refill: 6  5. Aortic stenosis, moderate Encouraged him to get applications today to apply for the [facility] discount so that we can get him back in with his cardiologist.  Asymptomatic at this time.  However he is overdue for repeat echo.  6. Skin lesion Does not appear too concerning but advised him to apply for the [facility] card/[facility] discount and let me know once he is approved so that we can refer to dermatology clinic.  7. Colon cancer screening Encouraged him to use and turn in the fit test  8. Prostate cancer screening Patient agreeable to prostate cancer screening. - PSA    Patient was given the opportunity to ask questions.  Patient verbalized  understanding of the plan and was able to repeat key elements of the plan.   This documentation was completed using Radio producer.  Any transcriptional errors are unintentional.  Orders Placed This Encounter  Procedures   CBC   Comprehensive metabolic panel   Lipid panel   PSA   HgB A1c   Glucose (CBG)     Requested Prescriptions   Signed Prescriptions Disp Refills   glipiZIDE (GLUCOTROL) 5 MG tablet 60 tablet 6    Sig: Take 1 tablet (5 mg total) by mouth 2 (two) times daily before a meal.   metFORMIN (GLUCOPHAGE) 1000 MG tablet 60 tablet 6    Sig: TAKE 1 TABLET (1,000 MG TOTAL) BY MOUTH 2 (TWO) TIMES DAILY WITH A  MEAL.   metoprolol tartrate (LOPRESSOR) 25 MG tablet 90 tablet 6    Sig: TAKE 1.5 TABLETS (37.5 MG TOTAL) BY MOUTH 2 (TWO) TIMES DAILY.   atorvastatin (LIPITOR) 80 MG tablet 30 tablet 6    Sig: Take 1 tablet (80 mg total) by mouth daily.   clopidogrel (PLAVIX) 75 MG tablet 30 tablet 6    Sig: TAKE 1 TABLET (75 MG TOTAL) BY MOUTH DAILY. (Must have office visit for refills)   Dulaglutide (TRULICITY) 1.5 TV/4.7XG SOPN 2 mL 6    Sig: Inject 1.5 mg into the skin once a week.    Return in about 4 months (around 07/12/2022).  Karle Plumber, MD, FACP

## 2022-03-11 NOTE — Patient Instructions (Signed)
Please apply for the [facility] card/[facility] discount card.  We need to get you back to the cardiologist for follow-up on your heart disease and the valvular heart disease known as aortic stenosis. We can also refer you to the dermatologist once you are approved for the [facility] discount.

## 2022-03-12 ENCOUNTER — Other Ambulatory Visit: Payer: Self-pay | Admitting: Internal Medicine

## 2022-03-12 ENCOUNTER — Other Ambulatory Visit: Payer: Self-pay

## 2022-03-12 LAB — COMPREHENSIVE METABOLIC PANEL
ALT: 19 IU/L (ref 0–44)
AST: 15 IU/L (ref 0–40)
Albumin/Globulin Ratio: 1.5 (ref 1.2–2.2)
Albumin: 4.4 g/dL (ref 3.8–4.9)
Alkaline Phosphatase: 66 IU/L (ref 44–121)
BUN/Creatinine Ratio: 15 (ref 9–20)
BUN: 13 mg/dL (ref 6–24)
Bilirubin Total: 0.5 mg/dL (ref 0.0–1.2)
CO2: 23 mmol/L (ref 20–29)
Calcium: 10 mg/dL (ref 8.7–10.2)
Chloride: 103 mmol/L (ref 96–106)
Creatinine, Ser: 0.87 mg/dL (ref 0.76–1.27)
Globulin, Total: 3 g/dL (ref 1.5–4.5)
Glucose: 106 mg/dL — ABNORMAL HIGH (ref 70–99)
Potassium: 4.4 mmol/L (ref 3.5–5.2)
Sodium: 140 mmol/L (ref 134–144)
Total Protein: 7.4 g/dL (ref 6.0–8.5)
eGFR: 102 mL/min/{1.73_m2} (ref >59)

## 2022-03-12 LAB — LIPID PANEL
Chol/HDL Ratio: 4 ratio (ref 0.0–5.0)
Cholesterol, Total: 127 mg/dL (ref 100–199)
HDL: 32 mg/dL — ABNORMAL LOW (ref >39)
LDL Chol Calc (NIH): 82 mg/dL (ref 0–99)
Triglycerides: 64 mg/dL (ref 0–149)
VLDL Cholesterol Cal: 13 mg/dL (ref 5–40)

## 2022-03-12 LAB — CBC
Hematocrit: 42.5 % (ref 37.5–51.0)
Hemoglobin: 13.8 g/dL (ref 13.0–17.7)
MCH: 28.5 pg (ref 26.6–33.0)
MCHC: 32.5 g/dL (ref 31.5–35.7)
MCV: 88 fL (ref 79–97)
Platelets: 286 10*3/uL (ref 150–450)
RBC: 4.85 x10E6/uL (ref 4.14–5.80)
RDW: 13 % (ref 11.6–15.4)
WBC: 8.2 10*3/uL (ref 3.4–10.8)

## 2022-03-12 LAB — PSA: Prostate Specific Ag, Serum: 0.5 ng/mL (ref 0.0–4.0)

## 2022-03-12 MED ORDER — EZETIMIBE 10 MG PO TABS
10.0000 mg | ORAL_TABLET | Freq: Every day | ORAL | 3 refills | Status: DC
  Filled 2022-03-12: qty 90, 90d supply, fill #0
  Filled 2022-05-16 – 2022-06-05 (×2): qty 30, 30d supply, fill #1

## 2022-03-14 ENCOUNTER — Other Ambulatory Visit: Payer: Self-pay

## 2022-04-21 ENCOUNTER — Other Ambulatory Visit: Payer: Self-pay

## 2022-05-16 ENCOUNTER — Other Ambulatory Visit: Payer: Self-pay

## 2022-05-21 ENCOUNTER — Other Ambulatory Visit: Payer: Self-pay

## 2022-06-05 ENCOUNTER — Other Ambulatory Visit: Payer: Self-pay

## 2022-06-10 ENCOUNTER — Other Ambulatory Visit: Payer: Self-pay

## 2022-06-20 ENCOUNTER — Other Ambulatory Visit: Payer: Self-pay

## 2022-06-23 ENCOUNTER — Other Ambulatory Visit: Payer: Self-pay

## 2022-07-15 ENCOUNTER — Ambulatory Visit: Payer: Self-pay | Admitting: Internal Medicine

## 2022-07-22 ENCOUNTER — Other Ambulatory Visit: Payer: Self-pay | Admitting: Internal Medicine

## 2022-07-23 ENCOUNTER — Other Ambulatory Visit: Payer: Self-pay

## 2022-07-23 MED ORDER — EZETIMIBE 10 MG PO TABS
10.0000 mg | ORAL_TABLET | Freq: Every day | ORAL | 0 refills | Status: DC
  Filled 2022-07-23: qty 30, 30d supply, fill #0

## 2022-07-28 ENCOUNTER — Other Ambulatory Visit: Payer: Self-pay

## 2022-08-19 ENCOUNTER — Encounter: Payer: Self-pay | Admitting: Internal Medicine

## 2022-08-19 ENCOUNTER — Ambulatory Visit: Payer: Self-pay | Attending: Internal Medicine | Admitting: Internal Medicine

## 2022-08-19 VITALS — BP 130/80 | HR 73 | Temp 98.8°F | Ht 69.0 in | Wt 237.0 lb

## 2022-08-19 DIAGNOSIS — I25810 Atherosclerosis of coronary artery bypass graft(s) without angina pectoris: Secondary | ICD-10-CM

## 2022-08-19 DIAGNOSIS — I152 Hypertension secondary to endocrine disorders: Secondary | ICD-10-CM

## 2022-08-19 DIAGNOSIS — Z2821 Immunization not carried out because of patient refusal: Secondary | ICD-10-CM

## 2022-08-19 DIAGNOSIS — E669 Obesity, unspecified: Secondary | ICD-10-CM

## 2022-08-19 DIAGNOSIS — E785 Hyperlipidemia, unspecified: Secondary | ICD-10-CM

## 2022-08-19 DIAGNOSIS — E1159 Type 2 diabetes mellitus with other circulatory complications: Secondary | ICD-10-CM

## 2022-08-19 DIAGNOSIS — M65331 Trigger finger, right middle finger: Secondary | ICD-10-CM

## 2022-08-19 DIAGNOSIS — Z1211 Encounter for screening for malignant neoplasm of colon: Secondary | ICD-10-CM

## 2022-08-19 DIAGNOSIS — M65341 Trigger finger, right ring finger: Secondary | ICD-10-CM

## 2022-08-19 DIAGNOSIS — E119 Type 2 diabetes mellitus without complications: Secondary | ICD-10-CM

## 2022-08-19 DIAGNOSIS — E1169 Type 2 diabetes mellitus with other specified complication: Secondary | ICD-10-CM

## 2022-08-19 LAB — GLUCOSE, POCT (MANUAL RESULT ENTRY): POC Glucose: 113 mg/dl — AB (ref 70–99)

## 2022-08-19 LAB — POCT GLYCOSYLATED HEMOGLOBIN (HGB A1C): HbA1c, POC (controlled diabetic range): 6.8 % (ref 0.0–7.0)

## 2022-08-19 NOTE — Patient Instructions (Signed)
Trigger Finger  Trigger finger, also called stenosing tenosynovitis,  is a condition that causes a finger to get stuck in a bent position. Each finger has a tendon, which is a tough, cord-like tissue that connects muscle to bone, and each tendon passes through a tunnel of tissue called a tendon sheath. To move your finger, your tendon needs to glide freely through the sheath. Trigger finger happens when the tendon or the sheath thickens, making it difficult to move your finger. Trigger finger can affect any finger or a thumb. It may affect more than one finger. Mild cases may clear up with rest and medicine. Severe cases require more treatment. What are the causes? Trigger finger is caused by a thickened finger tendon or tendon sheath. The cause of this thickening is not known. What increases the risk? The following factors may make you more likely to develop this condition: Doing activities that require a strong grip. Having rheumatoid arthritis, gout, or diabetes. Being 40-60 years old. Being male. What are the signs or symptoms? Symptoms of this condition include: Pain when bending or straightening your finger. Tenderness or swelling where your finger attaches to the palm of your hand. A lump in the palm of your hand or on the inside of your finger. Hearing a noise like a pop or a snap when you try to straighten your finger. Feeling a catching or locking sensation when you try to straighten your finger. Being unable to straighten your finger. How is this diagnosed? This condition is diagnosed based on your symptoms and a physical exam. How is this treated? This condition may be treated by: Resting your finger and avoiding activities that make symptoms worse. Wearing a finger splint to keep your finger extended. Taking NSAIDs, such as ibuprofen, to relieve pain and swelling. Doing gentle exercises to stretch the finger as told by your health care provider. Having medicine that reduces  swelling and inflammation (steroids) injected into the tendon sheath. Injections may need to be repeated. Having surgery to open the tendon sheath. This may be done if other treatments do not work and you cannot straighten your finger. You may need physical therapy after surgery. Follow these instructions at home: If you have a splint: Wear the splint as told by your health care provider. Remove it only as told by your health care provider. Loosen it if your fingers tingle, become numb, or turn cold and blue. Keep it clean. If the splint is not waterproof: Do not let it get wet. Cover it with a watertight covering when you take a bath or shower. Managing pain, stiffness, and swelling     If directed, apply heat to the affected area as often as told by your health care provider. Use the heat source that your health care provider recommends, such as a moist heat pack or a heating pad. Place a towel between your skin and the heat source. Leave the heat on for 20-30 minutes. Remove the heat if your skin turns bright red. This is especially important if you are unable to feel pain, heat, or cold. You may have a greater risk of getting burned. If directed, put ice on the painful area. To do this: If you have a removable splint, remove it as told by your health care provider. Put ice in a plastic bag. Place a towel between your skin and the bag or between your splint and the bag. Leave the ice on for 20 minutes, 2-3 times a day.  Activity Rest   your finger as told by your health care provider. Avoid activities that make the pain worse. Return to your normal activities as told by your health care provider. Ask your health care provider what activities are safe for you. Do exercises as told by your health care provider. Ask your health care provider when it is safe to drive if you have a splint on your hand. General instructions Take over-the-counter and prescription medicines only as told by  your health care provider. Keep all follow-up visits as told by your health care provider. This is important. Contact a health care provider if: Your symptoms are not improving with home care. Summary Trigger finger, also called stenosing tenosynovitis, causes your finger to get stuck in a bent position. This can make it difficult and painful to straighten your finger. This condition develops when a finger tendon or tendon sheath thickens. Treatment may include resting your finger, wearing a splint, and taking medicines. In severe cases, surgery to open the tendon sheath may be needed. This information is not intended to replace advice given to you by your health care provider. Make sure you discuss any questions you have with your health care provider. Document Revised: 01/03/2019 Document Reviewed: 01/03/2019 Elsevier Patient Education  2023 [employer]  

## 2022-08-19 NOTE — Progress Notes (Addendum)
Patient ID: Johnny Nichols, male    DOB: 09-22-1965  MRN: [id number]  CC: Diabetes (DM f/u. /Middle finger on R hand locking / popping, stiff finger in the mornings X1 mo/No to flu vax.)   Subjective: Johnny Nichols is a 56 y.o. male who presents for chronic ds management His concerns today include:  DM, HTN, OSA, HL,obesity, CAD s/p CABGx4, PAF, mod AS   DM: Results for orders placed or performed in visit on 08/19/22  POCT glucose (manual entry)  Result Value Ref Range   POC Glucose 113 (A) 70 - 99 mg/dl  POCT glycosylated hemoglobin (Hb A1C)  Result Value Ref Range   Hemoglobin A1C     HbA1c POC (<> result, manual entry)     HbA1c, POC (prediabetic range)     HbA1c, POC (controlled diabetic range) 6.8 0.0 - 7.0 %  Compliant with meds including Trulicity 1.5 mg, Metformin 1 gram BID and Glucotrol 5 mg.  No major S.E from his meds Checks BS in a.m.  Range 86-110 Going to bed late and eating late due to work schedule.  Gets home from work 9 p.m.  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  No insurance.   HTN/CAD/PAF/mod AS: Currently taking: see medication list -metoprolol 37.5 mg twice a day, atorvastatin 80 mg daily, Plavix 75 mg daily, Entresto 24/26 mg twice a day and aspirin. No CP/SOB/LE   Trigger finger on RT middle and ring finger.  Locks up intermittently  HM:  flu shot declined.  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 08/22/2020   NSTEMI (non-ST  elevated myocardial infarction) ([place]) 08/18/2020   Pneumonia 08/18/2020   Atrial fibrillation with RVR ([place]) 08/18/2020   Obesity (BMI 30.0-34.9) 02/08/2018   Hyperlipidemia 10/25/2015   Essential hypertension 10/11/2015   Diabetes mellitus without complication ([place]) 96/28/3662   Dental caries 04/04/2013   OSA (obstructive sleep apnea) 03/16/2013     Current Outpatient Medications on File Prior to Visit  Medication Sig Dispense Refill   acetaminophen (TYLENOL) 325 MG tablet Take 2 tablets (650 mg total) by mouth every 6 (six) hours as needed for mild pain.     Ascorbic Acid (VITAMIN C) 1000 MG tablet Take 1,000 mg by mouth daily.     aspirin EC 81 MG EC tablet Take 1 tablet (81 mg total) by mouth daily. Swallow whole. 30 tablet 11   atorvastatin (LIPITOR) 80 MG tablet Take 1 tablet (80 mg total) by mouth daily. 30 tablet 6   Blood Glucose Monitoring Suppl (TRUE METRIX METER) w/Device KIT Use to check blood sugar as instructed. 1 kit 0   Cholecalciferol (VITAMIN D3 PO) Take 1 capsule by mouth daily.     clopidogrel (PLAVIX) 75 MG tablet TAKE 1 TABLET (75 MG TOTAL) BY MOUTH DAILY. (Must have office visit for refills) 30 tablet 6   Dulaglutide (TRULICITY) 1.5 HU/7.6LY SOPN Inject 1.5 mg into the skin once a week. 2 mL 6   ezetimibe (  ZETIA) 10 MG tablet Take 1 tablet (10 mg total) by mouth daily. 30 tablet 0   glipiZIDE (GLUCOTROL) 5 MG tablet Take 1 tablet (5 mg total) by mouth 2 (two) times daily before a meal. 60 tablet 6   metFORMIN (GLUCOPHAGE) 1000 MG tablet TAKE 1 TABLET (1,000 MG TOTAL) BY MOUTH 2 (TWO) TIMES DAILY WITH A MEAL. 60 tablet 6   metoprolol tartrate (LOPRESSOR) 25 MG tablet TAKE 1.5 TABLETS (37.5 MG TOTAL) BY MOUTH 2 (TWO) TIMES DAILY. 90 tablet 6   sacubitril-valsartan (ENTRESTO) 24-26 MG TAKE 1 TABLET BY MOUTH 2 (TWO) TIMES DAILY. 180 tablet 3   TRUEPLUS LANCETS 28G MISC Use as instructed to check blood sugar daily. 100 each 11   No current facility-administered  medications on file prior to visit.    No Known Allergies  Social History   Socioeconomic History   Marital status: Divorced    Spouse name: Not on file   Number of children: Not on file   Years of education: 16   Highest education level: Not on file  Occupational History   Occupation: works as Technical brewer and a Writer  Tobacco Use   Smoking status: Never   Smokeless tobacco: Never  Scientific laboratory technician Use: Not on file  Substance and Sexual Activity   Alcohol use: Yes    Alcohol/week: 1.0 standard drink of alcohol    Types: 1 Cans of beer per week    Comment: socially   Drug use: No   Sexual activity: Not on file  Other Topics Concern   Not on file  Social History Narrative   Not on file   Social Determinants of Health   Financial Resource Strain: Not on file  Food Insecurity: Not on file  Transportation Needs: Not on file  Physical Activity: Not on file  Stress: Not on file  Social Connections: Not on file  Intimate Partner Violence: Not on file    Family History  Problem Relation Age of Onset   Heart disease Mother    Cancer Father    Hypertension Other    Obesity Other     Past Surgical History:  Procedure Laterality Date   CARDIAC CATHETERIZATION     CLIPPING OF ATRIAL APPENDAGE N/A 08/22/2020   Procedure: CLIPPING OF ATRIAL APPENDAGE USING ATRICURE 40MM ATRICLIP;  Surgeon: Grace Isaac, MD;  Location: [place];  Service: Open Heart Surgery;  Laterality: N/A;   CORONARY ARTERY BYPASS GRAFT N/A 08/22/2020   Procedure: CORONARY ARTERY BYPASS GRAFTING (CABG) TIMES FOUR, USING LEFT INTERNAL MAMMARY ARTERY, LEFT RADIAL ARTERY, AND RIGHT LEG GREATER SAPHENOUS VEIN HARVESTED ENDOSCOPICALLY;  Surgeon: Grace Isaac, MD;  Location: [place];  Service: Open Heart Surgery;  Laterality: N/A;   ENDOVEIN HARVEST OF GREATER SAPHENOUS VEIN Right 08/22/2020   Procedure: ENDOVEIN HARVEST OF GREATER SAPHENOUS VEIN;  Surgeon: Grace Isaac, MD;  Location: [place];   Service: Open Heart Surgery;  Laterality: Right;   LEFT HEART CATH AND CORONARY ANGIOGRAPHY N/A 08/18/2020   Procedure: LEFT HEART CATH AND CORONARY ANGIOGRAPHY;  Surgeon: Jettie Booze, MD;  Location: [facility];  Service: Cardiovascular;  Laterality: N/A;   RADIAL ARTERY HARVEST Left 08/22/2020   Procedure: LEFT RADIAL ARTERY HARVEST;  Surgeon: Grace Isaac, MD;  Location: [facility];  Service: Open Heart Surgery;  Laterality: Left;   TEE WITHOUT CARDIOVERSION N/A 08/22/2020   Procedure: TRANSESOPHAGEAL ECHOCARDIOGRAM (TEE);  Surgeon: Grace Isaac, MD;  Location: [facility];  Service: Open Heart Surgery;  Laterality: N/A;    ROS: Review of Systems Negative except as stated above  PHYSICAL EXAM: BP 130/80   Pulse 73   Temp 98.8 F (37.1 C) (Oral)   Ht _0  (1.753 m)   Wt 237 lb (107.5 kg)   SpO2 97%   BMI 35.00 kg/m   Wt Readings from Last 3 Encounters:  08/19/22 237 lb (107.5 kg)  03/11/22 239 lb (108.4 kg)  08/05/21 230 lb 9.6 oz (104.6 kg)    Physical Exam   General appearance - alert, well appearing, and in no distress Mental status - normal mood, behavior, speech, dress, motor activity, and thought processes Neck - supple, no significant adenopathy Chest - clear to auscultation, no wheezes, rales or rhonchi, symmetric air entry Heart - normal rate, regular rhythm, 2/6 diastolic murmur heard along the left upper sternal border Musculoskeletal -right hand: Good range of motion. Extremities - peripheral pulses normal, no pedal edema, no clubbing or cyanosis     Latest Ref Rng & Units 03/11/2022    4:53 PM 01/03/2021    9:12 AM 08/28/2020    9:43 AM  CMP  Glucose 70 - 99 mg/dL 106  93  145   BUN 6 - 24 mg/dL _1 Creatinine 0.76 - 1.27 mg/dL 0.87  1.18  1.03   Sodium 134 - 144 mmol/L 140  141  135   Potassium 3.5 - 5.2 mmol/L 4.4  4.6  4.2   Chloride 96 - 106 mmol/L 103  102  102   CO2 20 - 29 mmol/L _2 Calcium 8.7 - 10.2 mg/dL  10.0  9.4  9.3   Total Protein 6.0 - 8.5 g/dL 7.4  7.4    Total Bilirubin 0.0 - 1.2 mg/dL 0.5  0.3    Alkaline Phos 44 - 121 IU/L 66  74    AST 0 - 40 IU/L 15  19    ALT 0 - 44 IU/L 19  21     Lipid Panel     Component Value Date/Time   CHOL 127 03/11/2022 1653   TRIG 64 03/11/2022 1653   HDL 32 (L) 03/11/2022 1653   CHOLHDL 4.0 03/11/2022 1653   CHOLHDL 4.1 08/18/2020 0631   VLDL 13 08/18/2020 0631   LDLCALC 82 03/11/2022 1653    CBC    Component Value Date/Time   WBC 8.2 03/11/2022 1653   WBC 10.6 (H) 08/27/2020 0935   RBC 4.85 03/11/2022 1653   RBC 4.13 (L) 08/27/2020 0935   HGB 13.8 03/11/2022 1653   HCT 42.5 03/11/2022 1653   PLT 286 03/11/2022 1653   MCV 88 03/11/2022 1653   MCH 28.5 03/11/2022 1653   MCH 29.3 08/27/2020 0935   MCHC 32.5 03/11/2022 1653   MCHC 34.1 08/27/2020 0935   RDW 13.0 03/11/2022 1653   LYMPHSABS 1.7 08/18/2020 0100   LYMPHSABS 2.2 06/04/2020 1440   MONOABS 1.0 08/18/2020 0100   EOSABS 0.4 08/18/2020 0100   EOSABS 1.2 (H) 06/04/2020 1440   BASOSABS 0.1 08/18/2020 0100   BASOSABS 0.1 06/04/2020 1440    ASSESSMENT AND PLAN: 1. Type 2 diabetes mellitus with obesity ([place]) At goal.  Patient to continue metformin, glipizide and Trulicity. Try to avoid eating late at nights.  Encouraged him to try to eat earlier in the evenings at work. Encouraged him to get an eye exam when he is able to afford. -  POCT glucose (manual entry) - POCT glycosylated hemoglobin (Hb A1C) - Microalbumin / creatinine urine ratio  2. Hypertension associated with diabetes ([place]) At goal.  Continue Entresto and metoprolol  and low-salt diet.  3. Hyperlipidemia associated with type 2 diabetes mellitus (H[facility]) Continue atorvastatin.  4. 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  Continue aspirin, Plavix, atorvastatin and Zetia.  5. Trigger middle finger of right hand 6. Trigger ring finger of right hand Encouraged him to apply for Medicaid.   Once approved we can refer him to orthopedics  7. Influenza vaccination declined   8. Screening for colon cancer - Fecal occult blood, imunochemical([facility])    Patient was given the opportunity to ask questions.  Patient verbalized understanding of the plan and was able to repeat key elements of the plan.   This documentation was completed using Radio producer.  Any transcriptional errors are unintentional.  Orders Placed This Encounter  Procedures   Fecal occult blood, imunochemical([facility])   Microalbumin / creatinine urine ratio   POCT glucose (manual entry)   POCT glycosylated hemoglobin (Hb A1C)     Requested Prescriptions    No prescriptions requested or ordered in this encounter    Return in about 4 months (around 12/19/2022).  Karle Plumber, MD, FACP

## 2022-08-20 ENCOUNTER — Other Ambulatory Visit: Payer: Self-pay

## 2022-08-20 ENCOUNTER — Other Ambulatory Visit: Payer: Self-pay | Admitting: Internal Medicine

## 2022-08-20 MED ORDER — EZETIMIBE 10 MG PO TABS
10.0000 mg | ORAL_TABLET | Freq: Every day | ORAL | 3 refills | Status: DC
  Filled 2022-08-20: qty 30, 30d supply, fill #0
  Filled 2022-09-23 – 2022-09-24 (×2): qty 30, 30d supply, fill #1
  Filled 2022-10-30: qty 30, 30d supply, fill #2
  Filled 2022-12-17 (×2): qty 30, 30d supply, fill #3

## 2022-08-21 ENCOUNTER — Other Ambulatory Visit: Payer: Self-pay

## 2022-08-21 LAB — MICROALBUMIN / CREATININE URINE RATIO
Creatinine, Urine: 63.1 mg/dL
Microalb/Creat Ratio: 8 mg/g creat (ref 0–29)
Microalbumin, Urine: 5.2 ug/mL

## 2022-08-22 LAB — FECAL OCCULT BLOOD, IMMUNOCHEMICAL: Fecal Occult Bld: NEGATIVE

## 2022-09-01 ENCOUNTER — Other Ambulatory Visit: Payer: Self-pay | Admitting: Internal Medicine

## 2022-09-01 DIAGNOSIS — E669 Obesity, unspecified: Secondary | ICD-10-CM

## 2022-09-24 ENCOUNTER — Other Ambulatory Visit: Payer: Self-pay

## 2022-09-26 ENCOUNTER — Other Ambulatory Visit: Payer: Self-pay

## 2022-10-14 ENCOUNTER — Other Ambulatory Visit: Payer: Self-pay

## 2022-10-30 ENCOUNTER — Other Ambulatory Visit: Payer: Self-pay | Admitting: Internal Medicine

## 2022-10-30 DIAGNOSIS — E1169 Type 2 diabetes mellitus with other specified complication: Secondary | ICD-10-CM

## 2022-10-30 DIAGNOSIS — I25810 Atherosclerosis of coronary artery bypass graft(s) without angina pectoris: Secondary | ICD-10-CM

## 2022-10-30 DIAGNOSIS — E1159 Type 2 diabetes mellitus with other circulatory complications: Secondary | ICD-10-CM

## 2022-10-31 ENCOUNTER — Other Ambulatory Visit: Payer: Self-pay | Admitting: Internal Medicine

## 2022-10-31 ENCOUNTER — Other Ambulatory Visit: Payer: Self-pay

## 2022-10-31 DIAGNOSIS — I25810 Atherosclerosis of coronary artery bypass graft(s) without angina pectoris: Secondary | ICD-10-CM

## 2022-10-31 MED ORDER — GLIPIZIDE 5 MG PO TABS
5.0000 mg | ORAL_TABLET | Freq: Two times a day (BID) | ORAL | 0 refills | Status: DC
  Filled 2022-10-31: qty 180, 90d supply, fill #0

## 2022-10-31 MED ORDER — ATORVASTATIN CALCIUM 80 MG PO TABS
80.0000 mg | ORAL_TABLET | Freq: Every day | ORAL | 0 refills | Status: DC
  Filled 2022-10-31: qty 90, 90d supply, fill #0

## 2022-10-31 MED ORDER — METOPROLOL TARTRATE 25 MG PO TABS
37.5000 mg | ORAL_TABLET | Freq: Two times a day (BID) | ORAL | 0 refills | Status: DC
  Filled 2022-10-31: qty 270, 90d supply, fill #0

## 2022-10-31 MED ORDER — CLOPIDOGREL BISULFATE 75 MG PO TABS
75.0000 mg | ORAL_TABLET | Freq: Every day | ORAL | 0 refills | Status: DC
  Filled 2022-10-31: qty 90, 90d supply, fill #0

## 2022-10-31 MED ORDER — METFORMIN HCL 1000 MG PO TABS
ORAL_TABLET | Freq: Two times a day (BID) | ORAL | 0 refills | Status: DC
  Filled 2022-10-31: qty 180, 90d supply, fill #0

## 2022-10-31 NOTE — Telephone Encounter (Signed)
Requested Prescriptions  Pending Prescriptions Disp Refills   clopidogrel (PLAVIX) 75 MG tablet 90 tablet 0    Sig: TAKE 1 TABLET (75 MG TOTAL) BY MOUTH DAILY. (Must have office visit for refills)     Hematology: Antiplatelets - clopidogrel Failed - 10/31/2022  8:21 AM      Failed - HCT in normal range and within 180 days    Hematocrit  Date Value Ref Range Status  03/11/2022 42.5 37.5 - 51.0 % Final         Failed - HGB in normal range and within 180 days    Hemoglobin  Date Value Ref Range Status  03/11/2022 13.8 13.0 - 17.7 g/dL Final   Total hemoglobin  Date Value Ref Range Status  08/25/2020 11.3 (L) 12.0 - 16.0 g/dL Final         Failed - PLT in normal range and within 180 days    Platelets  Date Value Ref Range Status  03/11/2022 286 150 - 450 x10E3/uL Final         Passed - Cr in normal range and within 360 days    Creat  Date Value Ref Range Status  10/11/2015 0.95 0.60 - 1.35 mg/dL Final   Creatinine, Ser  Date Value Ref Range Status  03/11/2022 0.87 0.76 - 1.27 mg/dL Final   Creatinine, Urine  Date Value Ref Range Status  10/11/2015 198 20 - 370 mg/dL Final         Passed - Valid encounter within last 6 months    Recent Outpatient Visits           2 months ago Type 2 diabetes mellitus with obesity ([place])   [place] Karle Plumber B, MD   7 months ago Type 2 diabetes mellitus with obesity [facility])   [place] Karle Plumber B, MD   1 year ago Type 2 diabetes mellitus with obesity [facility])   [place] Karle Plumber B, MD   1 year ago Type 2 diabetes mellitus with other circulatory complication, without long-term current use of insulin [facility])   [place] Karle Plumber B, MD   1 year ago Type 2 diabetes mellitus with other circulatory complication, without long-term current use of insulin [facility])   [place], Enobong, MD       Future Appointments             In 1 month Wynetta Emery, Dalbert Batman, MD [place]

## 2022-10-31 NOTE — Telephone Encounter (Signed)
Requested Prescriptions  Pending Prescriptions Disp Refills   atorvastatin (LIPITOR) 80 MG tablet 90 tablet 0    Sig: Take 1 tablet (80 mg total) by mouth daily.     Cardiovascular:  Antilipid - Statins Failed - 10/30/2022 11:23 PM      Failed - Lipid Panel in normal range within the last 12 months    Cholesterol, Total  Date Value Ref Range Status  03/11/2022 127 100 - 199 mg/dL Final   LDL Chol Calc (NIH)  Date Value Ref Range Status  03/11/2022 82 0 - 99 mg/dL Final   HDL  Date Value Ref Range Status  03/11/2022 32 (L) >39 mg/dL Final   Triglycerides  Date Value Ref Range Status  03/11/2022 64 0 - 149 mg/dL Final         Passed - Patient is not pregnant      Passed - Valid encounter within last 12 months    Recent Outpatient Visits           2 months ago Type 2 diabetes mellitus with obesity ([place])   [place] Karle Plumber B, MD   7 months ago Type 2 diabetes mellitus with obesity [facility])   [place] Karle Plumber B, MD   1 year ago Type 2 diabetes mellitus with obesity [facility])   [place] Karle Plumber B, MD   1 year ago Type 2 diabetes mellitus with other circulatory complication, without long-term current use of insulin ([place])   [place] Karle Plumber B, MD   1 year ago Type 2 diabetes mellitus with other circulatory complication, without long-term current use of insulin ([place])   [place] Charlott Rakes, MD       Future Appointments             In 1 month [name], MD [place]             metFORMIN (GLUCOPHAGE) 1000 MG tablet 180 tablet 0    Sig: TAKE 1 TABLET (1,000 MG TOTAL) BY MOUTH 2 (TWO) TIMES DAILY WITH A MEAL.     Endocrinology:  Diabetes - Biguanides Failed - 10/30/2022 11:23 PM      Failed - B12 Level in  normal range and within 720 days    No results found for: "VITAMINB12"       Failed - CBC within normal limits and completed in the last 12 months    WBC  Date Value Ref Range Status  03/11/2022 8.2 3.4 - 10.8 x10E3/uL Final  08/27/2020 10.6 (H) 4.0 - 10.5 K/uL Final   RBC  Date Value Ref Range Status  03/11/2022 4.85 4.14 - 5.80 x10E6/uL Final  08/27/2020 4.13 (L) 4.22 - 5.81 MIL/uL Final   Hemoglobin  Date Value Ref Range Status  03/11/2022 13.8 13.0 - 17.7 g/dL Final   Total hemoglobin  Date Value Ref Range Status  08/25/2020 11.3 (L) 12.0 - 16.0 g/dL Final   Hematocrit  Date Value Ref Range Status  03/11/2022 42.5 37.5 - 51.0 % Final   MCHC  Date Value Ref Range Status  03/11/2022 32.5 31.5 - 35.7 g/dL Final  08/27/2020 34.1 30.0 - 36.0 g/dL Final   [facility]  Date Value Ref Range Status  03/11/2022 28.5 26.6 - 33.0 pg Final  08/27/2020 29.3 26.0 - 34.0 pg  Final   MCV  Date Value Ref Range Status  03/11/2022 88 79 - 97 fL Final   No results found for: "PLTCOUNTKUC", "LABPLAT", "POCPLA" RDW  Date Value Ref Range Status  03/11/2022 13.0 11.6 - 15.4 % Final         Passed - Cr in normal range and within 360 days    Creat  Date Value Ref Range Status  10/11/2015 0.95 0.60 - 1.35 mg/dL Final   Creatinine, Ser  Date Value Ref Range Status  03/11/2022 0.87 0.76 - 1.27 mg/dL Final   Creatinine, Urine  Date Value Ref Range Status  10/11/2015 198 20 - 370 mg/dL Final         Passed - HBA1C is between 0 and 7.9 and within 180 days    HbA1c, POC (prediabetic range)  Date Value Ref Range Status  03/11/2022 6.4 5.7 - 6.4 % Final   HbA1c, POC (controlled diabetic range)  Date Value Ref Range Status  08/19/2022 6.8 0.0 - 7.0 % Final         Passed - eGFR in normal range and within 360 days    GFR, Est African American  Date Value Ref Range Status  10/11/2015 >89 >=60 mL/min Final   GFR calc Af Amer  Date Value Ref Range Status  05/14/2020 112 >59  mL/min/1.73 Final    Comment:    **[facility] currently reports eGFR in compliance with the current**   recommendations of the [employer]. [facility] will   update reporting as new guidelines are published from the NKF-ASN   Task force.    GFR, Est Non African American  Date Value Ref Range Status  10/11/2015 >89 >=60 mL/min Final    Comment:      The estimated GFR is a calculation valid for adults (>=34 years old) that uses the CKD-EPI algorithm to adjust for age and sex. It is   not to be used for children, pregnant women, hospitalized patients,    patients on dialysis, or with rapidly changing kidney function. According to the NKDEP, eGFR >89 is normal, 60-89 shows mild impairment, 30-59 shows moderate impairment, 15-29 shows severe impairment and <15 is ESRD.      GFR, Estimated  Date Value Ref Range Status  08/28/2020 >60 >60 mL/min Final    Comment:    (NOTE) Calculated using the CKD-EPI Creatinine Equation (2021)    eGFR  Date Value Ref Range Status  03/11/2022 102 >59 mL/min/1.73 Final         Passed - Valid encounter within last 6 months    Recent Outpatient Visits           2 months ago Type 2 diabetes mellitus with obesity ([place])   [place] Karle Plumber B, MD   7 months ago Type 2 diabetes mellitus with obesity [facility])   [place] Karle Plumber B, MD   1 year ago Type 2 diabetes mellitus with obesity [facility])   [place] Karle Plumber B, MD   1 year ago Type 2 diabetes mellitus with other circulatory complication, without long-term current use of insulin [facility])   [place] Karle Plumber B, MD   1 year ago Type 2 diabetes mellitus with other circulatory complication, without long-term current use of insulin [facility])   [place] Charlott Rakes, MD  Future Appointments             In 1 month [name], MD [place]             glipiZIDE (GLUCOTROL) 5 MG tablet 180 tablet 0    Sig: Take 1 tablet (5 mg total) by mouth 2 (two) times daily before a meal.     Endocrinology:  Diabetes - Sulfonylureas Passed - 10/30/2022 11:23 PM      Passed - HBA1C is between 0 and 7.9 and within 180 days    HbA1c, POC (prediabetic range)  Date Value Ref Range Status  03/11/2022 6.4 5.7 - 6.4 % Final   HbA1c, POC (controlled diabetic range)  Date Value Ref Range Status  08/19/2022 6.8 0.0 - 7.0 % Final         Passed - Cr in normal range and within 360 days    Creat  Date Value Ref Range Status  10/11/2015 0.95 0.60 - 1.35 mg/dL Final   Creatinine, Ser  Date Value Ref Range Status  03/11/2022 0.87 0.76 - 1.27 mg/dL Final   Creatinine, Urine  Date Value Ref Range Status  10/11/2015 198 20 - 370 mg/dL Final         Passed - Valid encounter within last 6 months    Recent Outpatient Visits           2 months ago Type 2 diabetes mellitus with obesity ([place])   [place] Karle Plumber B, MD   7 months ago Type 2 diabetes mellitus with obesity [facility])   [place] Karle Plumber B, MD   1 year ago Type 2 diabetes mellitus with obesity [facility])   [place] Karle Plumber B, MD   1 year ago Type 2 diabetes mellitus with other circulatory complication, without long-term current use of insulin ([place])   [place] Karle Plumber B, MD   1 year ago Type 2 diabetes mellitus with other circulatory complication, without long-term current use of insulin [facility])   [place], Enobong, MD       Future Appointments             In 1 month [name], MD [place]              metoprolol tartrate (LOPRESSOR) 25 MG tablet 270 tablet 0    Sig: TAKE 1.5 TABLETS (37.5 MG TOTAL) BY MOUTH 2 (TWO) TIMES DAILY.     Cardiovascular:  Beta Blockers Passed - 10/30/2022 11:23 PM      Passed - Last BP in normal range    BP Readings from Last 1 Encounters:  08/19/22 130/80         Passed - Last Heart Rate in normal range    Pulse Readings from Last 1 Encounters:  08/19/22 73         Passed - Valid encounter within last 6 months    Recent Outpatient Visits           2 months ago Type 2 diabetes mellitus with obesity [facility])   [place] Karle Plumber B, MD   7 months ago Type 2 diabetes mellitus with obesity [facility])   [place] [name], MD  1 year ago Type 2 diabetes mellitus with obesity [facility])   [place] Karle Plumber B, MD   1 year ago Type 2 diabetes mellitus with other circulatory complication, without long-term current use of insulin [facility])   [place] Karle Plumber B, MD   1 year ago Type 2 diabetes mellitus with other circulatory complication, without long-term current use of insulin [facility])   [place], Enobong, MD       Future Appointments             In 1 month Wynetta Emery, Dalbert Batman, MD [place]

## 2022-12-17 ENCOUNTER — Other Ambulatory Visit: Payer: Self-pay

## 2022-12-18 ENCOUNTER — Ambulatory Visit: Payer: Self-pay | Admitting: Internal Medicine

## 2022-12-23 ENCOUNTER — Other Ambulatory Visit: Payer: Self-pay

## 2022-12-23 ENCOUNTER — Ambulatory Visit: Payer: Self-pay | Admitting: Internal Medicine

## 2023-01-22 ENCOUNTER — Other Ambulatory Visit: Payer: Self-pay | Admitting: Internal Medicine

## 2023-01-22 DIAGNOSIS — I25810 Atherosclerosis of coronary artery bypass graft(s) without angina pectoris: Secondary | ICD-10-CM

## 2023-01-22 DIAGNOSIS — E1169 Type 2 diabetes mellitus with other specified complication: Secondary | ICD-10-CM

## 2023-01-22 DIAGNOSIS — E1159 Type 2 diabetes mellitus with other circulatory complications: Secondary | ICD-10-CM

## 2023-01-23 ENCOUNTER — Other Ambulatory Visit: Payer: Self-pay

## 2023-01-23 MED ORDER — METOPROLOL TARTRATE 25 MG PO TABS
37.5000 mg | ORAL_TABLET | Freq: Two times a day (BID) | ORAL | 0 refills | Status: DC
  Filled 2023-01-23 – 2023-02-02 (×2): qty 270, 90d supply, fill #0

## 2023-01-23 MED ORDER — ATORVASTATIN CALCIUM 80 MG PO TABS
80.0000 mg | ORAL_TABLET | Freq: Every day | ORAL | 1 refills | Status: DC
  Filled 2023-01-23 – 2023-02-02 (×2): qty 90, 90d supply, fill #0
  Filled 2023-04-28 (×2): qty 90, 90d supply, fill #1

## 2023-01-23 MED ORDER — METFORMIN HCL 1000 MG PO TABS
ORAL_TABLET | Freq: Two times a day (BID) | ORAL | 0 refills | Status: DC
  Filled 2023-01-23 – 2023-02-02 (×2): qty 180, 90d supply, fill #0

## 2023-01-23 MED ORDER — CLOPIDOGREL BISULFATE 75 MG PO TABS
75.0000 mg | ORAL_TABLET | Freq: Every day | ORAL | 0 refills | Status: DC
  Filled 2023-01-23 – 2023-02-02 (×2): qty 90, 90d supply, fill #0

## 2023-01-23 MED ORDER — GLIPIZIDE 5 MG PO TABS
5.0000 mg | ORAL_TABLET | Freq: Two times a day (BID) | ORAL | 0 refills | Status: DC
  Filled 2023-01-23 – 2023-02-02 (×2): qty 180, 90d supply, fill #0

## 2023-01-23 MED ORDER — EZETIMIBE 10 MG PO TABS
10.0000 mg | ORAL_TABLET | Freq: Every day | ORAL | 1 refills | Status: DC
  Filled 2023-01-23 – 2023-02-02 (×2): qty 90, 90d supply, fill #0
  Filled 2023-04-28 (×2): qty 90, 90d supply, fill #1

## 2023-01-23 NOTE — Telephone Encounter (Signed)
Requested Prescriptions  Pending Prescriptions Disp Refills   atorvastatin (LIPITOR) 80 MG tablet 90 tablet 1    Sig: Take 1 tablet (80 mg total) by mouth daily.     Cardiovascular:  Antilipid - Statins Failed - 01/22/2023  6:39 PM      Failed - Lipid Panel in normal range within the last 12 months    Cholesterol, Total  Date Value Ref Range Status  03/11/2022 127 100 - 199 mg/dL Final   LDL Chol Calc (NIH)  Date Value Ref Range Status  03/11/2022 82 0 - 99 mg/dL Final   HDL  Date Value Ref Range Status  03/11/2022 32 (L) >39 mg/dL Final   Triglycerides  Date Value Ref Range Status  03/11/2022 64 0 - 149 mg/dL Final         Passed - Patient is not pregnant      Passed - Valid encounter within last 12 months    Recent Outpatient Visits           5 months ago Type 2 diabetes mellitus with obesity (HCC)   [place] [facility] Jonah Blue B, MD   10 months ago Type 2 diabetes mellitus with obesity [facility])   [place] [facility] Jonah Blue B, MD   1 year ago Type 2 diabetes mellitus with obesity [facility])   [place] [facility] Jonah Blue B, MD   1 year ago Type 2 diabetes mellitus with other circulatory complication, without long-term current use of insulin [facility])   [place] [facility] Jon[facility] Blue B, MD   2 years ago Type 2 diabetes mellitus with other circulatory complication, without long-term current use of insulin [facility])   [place] [facility] Hoy Register, MD       Future Appointments             In 2 months Marcine Matar, MD [facility]             clopidogrel (PLAVIX) 75 MG tablet 90 tablet 0    Sig: TAKE 1 TABLET (75 MG TOTAL) BY MOUTH DAILY. (Must have office visit for refills)     Hematology: Antiplatelets - clopidogrel Failed - 01/22/2023  6:39 PM      Failed - HCT in  normal range and within 180 days    Hematocrit  Date Value Ref Range Status  03/11/2022 42.5 37.5 - 51.0 % Final         Failed - HGB in normal range and within 180 days    Hemoglobin  Date Value Ref Range Status  03/11/2022 13.8 13.0 - 17.7 g/dL Final   Total hemoglobin  Date Value Ref Range Status  08/25/2020 11.3 (L) 12.0 - 16.0 g/dL Final         Failed - PLT in normal range and within 180 days    Platelets  Date Value Ref Range Status  03/11/2022 286 150 - 450 x10E3/uL Final         Passed - Cr in normal range and within 360 days    Creat  Date Value Ref Range Status  10/11/2015 0.95 0.60 - 1.35 mg/dL Final   Creatinine, Ser  Date Value Ref Range Status  03/11/2022 0.87 0.76 - 1.27 mg/dL Final   Creatinine, Urine  Date Value Ref Range Status  10/11/2015 198 20 - 370 mg/dL Final  Passed - Valid encounter within last 6 months    Recent Outpatient Visits           5 months ago Type 2 diabetes mellitus with obesity [facility])   [place] [facility] Jon[facility] Blue B, MD   10 months ago Type 2 diabetes mellitus with obesity [facility])   [place] [facility] Jonah Blue B, MD   1 year ago Type 2 diabetes mellitus with obesity [facility])   [place] [facility] & [facility] Jonah Blue B, MD   1 year ago Type 2 diabetes mellitus with other circulatory complication, without long-term current use of insulin [facility])   [place] [facility] Jonah Blue B, MD   2 years ago Type 2 diabetes mellitus with other circulatory complication, without long-term current use of insulin [facility])   [place] [facility] Hoy Register, MD       Future Appointments             In 2 months Marcine Matar, MD [facility]             ezetimibe (ZETIA) 10 MG tablet 90 tablet 1    Sig: Take 1 tablet (10 mg total) by mouth  daily.     Cardiovascular:  Antilipid - Sterol Transport Inhibitors Failed - 01/22/2023  6:39 PM      Failed - Lipid Panel in normal range within the last 12 months    Cholesterol, Total  Date Value Ref Range Status  03/11/2022 127 100 - 199 mg/dL Final   LDL Chol Calc (NIH)  Date Value Ref Range Status  03/11/2022 82 0 - 99 mg/dL Final   HDL  Date Value Ref Range Status  03/11/2022 32 (L) >39 mg/dL Final   Triglycerides  Date Value Ref Range Status  03/11/2022 64 0 - 149 mg/dL Final         Passed - AST in normal range and within 360 days    AST  Date Value Ref Range Status  03/11/2022 15 0 - 40 IU/L Final         Passed - ALT in normal range and within 360 days    ALT  Date Value Ref Range Status  03/11/2022 19 0 - 44 IU/L Final         Passed - Patient is not pregnant      Passed - Valid encounter within last 12 months    Recent Outpatient Visits           5 months ago Type 2 diabetes mellitus with obesity (HCC)   [place] [facility] Jonah Blue B, MD   10 months ago Type 2 diabetes mellitus with obesity [facility])   [place] [facility] Blue B, MD   1 year ago Type 2 diabetes mellitus with obesity [facility])   [place] [facility] Jonah Blue B, MD   1 year ago Type 2 diabetes mellitus with other circulatory complication, without long-term current use of insulin [facility])    [facility] Jonah Blue B, MD   2 years ago Type 2 diabetes mellitus with other circulatory complication, without long-term current use of insulin [facility])    [facility] Hoy Register, MD       Future Appointments  In 2 months Marcine Matar, MD [place] [facility]             metFORMIN (GLUCOPHAGE) 1000 MG tablet 180 tablet 0    Sig: TAKE 1 TABLET (1,000 MG TOTAL) BY MOUTH 2  (TWO) TIMES DAILY WITH A MEAL.     Endocrinology:  Diabetes - Biguanides Failed - 01/22/2023  6:39 PM      Failed - B12 Level in normal range and within 720 days    No results found for: "VITAMINB12"       Failed - CBC within normal limits and completed in the last 12 months    WBC  Date Value Ref Range Status  03/11/2022 8.2 3.4 - 10.8 x10E3/uL Final  08/27/2020 10.6 (H) 4.0 - 10.5 K/uL Final   RBC  Date Value Ref Range Status  03/11/2022 4.85 4.14 - 5.80 x10E6/uL Final  08/27/2020 4.13 (L) 4.22 - 5.81 MIL/uL Final   Hemoglobin  Date Value Ref Range Status  03/11/2022 13.8 13.0 - 17.7 g/dL Final   Total hemoglobin  Date Value Ref Range Status  08/25/2020 11.3 (L) 12.0 - 16.0 g/dL Final   Hematocrit  Date Value Ref Range Status  03/11/2022 42.5 37.5 - 51.0 % Final   MCHC  Date Value Ref Range Status  03/11/2022 32.5 31.5 - 35.7 g/dL Final  19/14/7829 56.2 30.0 - 36.0 g/dL Final   [facility]  Date Value Ref Range Status  03/11/2022 28.5 26.6 - 33.0 pg Final  08/27/2020 29.3 26.0 - 34.0 pg Final   MCV  Date Value Ref Range Status  03/11/2022 88 79 - 97 fL Final   No results found for: "PLTCOUNTKUC", "LABPLAT", "POCPLA" RDW  Date Value Ref Range Status  03/11/2022 13.0 11.6 - 15.4 % Final         Passed - Cr in normal range and within 360 days    Creat  Date Value Ref Range Status  10/11/2015 0.95 0.60 - 1.35 mg/dL Final   Creatinine, Ser  Date Value Ref Range Status  03/11/2022 0.87 0.76 - 1.27 mg/dL Final   Creatinine, Urine  Date Value Ref Range Status  10/11/2015 198 20 - 370 mg/dL Final         Passed - HBA1C is between 0 and 7.9 and within 180 days    HbA1c, POC (prediabetic range)  Date Value Ref Range Status  03/11/2022 6.4 5.7 - 6.4 % Final   HbA1c, POC (controlled diabetic range)  Date Value Ref Range Status  08/19/2022 6.8 0.0 - 7.0 % Final         Passed - eGFR in normal range and within 360 days    GFR, Est African American  Date Value  Ref Range Status  10/11/2015 >89 >=60 mL/min Final   GFR calc Af Amer  Date Value Ref Range Status  05/14/2020 112 >59 mL/min/1.73 Final    Comment:    **[facility] currently reports eGFR in compliance with the current**   recommendations of the [employer]. [facility] will   update reporting as new guidelines are published from the NKF-ASN   Task force.    GFR, Est Non African American  Date Value Ref Range Status  10/11/2015 >89 >=60 mL/min Final    Comment:      The estimated GFR is a calculation valid for adults (>=23 years old) that uses the CKD-EPI algorithm to adjust for age and sex. It is   not to be  used for children, pregnant women, hospitalized patients,    patients on dialysis, or with rapidly changing kidney function. According to the NKDEP, eGFR >89 is normal, 60-89 shows mild impairment, 30-59 shows moderate impairment, 15-29 shows severe impairment and <15 is ESRD.      GFR, Estimated  Date Value Ref Range Status  08/28/2020 >60 >60 mL/min Final    Comment:    (NOTE) Calculated using the CKD-EPI Creatinine Equation (2021)    eGFR  Date Value Ref Range Status  03/11/2022 102 >59 mL/min/1.73 Final         Passed - Valid encounter within last 6 months    Recent Outpatient Visits           5 months ago Type 2 diabetes mellitus with obesity (HCC)   [place] [facility] Jonah Blue B, MD   10 months ago Type 2 diabetes mellitus with obesity [facility])   [place] [facility] Jonah Blue B, MD   1 year ago Type 2 diabetes mellitus with obesity (HCC)   [place] [facility] Jonah Blue B, MD   1 year ago Type 2 diabetes mellitus with other circulatory complication, without long-term current use of insulin (HCC)   [place] [facility] Jonah Blue B, MD   2 years ago Type 2 diabetes mellitus with other circulatory complication,  without long-term current use of insulin (HCC)   [place] [facility] [place], Odette Horns, MD       Future Appointments             In 2 months Marcine Matar, MD [place] [facility]             glipiZIDE (GLUCOTROL) 5 MG tablet 180 tablet 0    Sig: Take 1 tablet (5 mg total) by mouth 2 (two) times daily before a meal.     Endocrinology:  Diabetes - Sulfonylureas Passed - 01/22/2023  6:39 PM      Passed - HBA1C is between 0 and 7.9 and within 180 days    HbA1c, POC (prediabetic range)  Date Value Ref Range Status  03/11/2022 6.4 5.7 - 6.4 % Final   HbA1c, POC (controlled diabetic range)  Date Value Ref Range Status  08/19/2022 6.8 0.0 - 7.0 % Final         Passed - Cr in normal range and within 360 days    Creat  Date Value Ref Range Status  10/11/2015 0.95 0.60 - 1.35 mg/dL Final   Creatinine, Ser  Date Value Ref Range Status  03/11/2022 0.87 0.76 - 1.27 mg/dL Final   Creatinine, Urine  Date Value Ref Range Status  10/11/2015 198 20 - 370 mg/dL Final         Passed - Valid encounter within last 6 months    Recent Outpatient Visits           5 months ago Type 2 diabetes mellitus with obesity (HCC)   [place] [facility], MD   10 months ago Type 2 diabetes mellitus with obesity [facility])   [place] [facility] Jonah Blue B, MD   1 year ago Type 2 diabetes mellitus with obesity [facility])    [facility] Jonah Blue B, MD   1 year ago Type 2 diabetes mellitus with other circulatory  complication, without long-term current use of insulin [facility])   [place] [facility] Jonah Blue B, MD   2 years ago Type 2 diabetes mellitus with other circulatory complication, without long-term current use of insulin [facility])   [place] [facility] Hoy Register, MD        Future Appointments             In 2 months Marcine Matar, MD [place] [facility]             metoprolol tartrate (LOPRESSOR) 25 MG tablet 270 tablet 0    Sig: TAKE 1.5 TABLETS (37.5 MG TOTAL) BY MOUTH 2 (TWO) TIMES DAILY.     Cardiovascular:  Beta Blockers Passed - 01/22/2023  6:39 PM      Passed - Last BP in normal range    BP Readings from Last 1 Encounters:  08/19/22 130/80         Passed - Last Heart Rate in normal range    Pulse Readings from Last 1 Encounters:  08/19/22 73         Passed - Valid encounter within last 6 months    Recent Outpatient Visits           5 months ago Type 2 diabetes mellitus with obesity (HCC)   [place] [facility] Jonah Blue B, MD   10 months ago Type 2 diabetes mellitus with obesity [facility])   [place] [facility] Jonah Blue B, MD   1 year ago Type 2 diabetes mellitus with obesity [facility])   [place] [facility] Jonah Blue B, MD   1 year ago Type 2 diabetes mellitus with other circulatory complication, without long-term current use of insulin [facility])   [place] [facility] Jonah Blue B, MD   2 years ago Type 2 diabetes mellitus with other circulatory complication, without long-term current use of insulin [facility])   [place] [facility] Hoy Register, MD       Future Appointments             In 2 months Laural Benes, Binnie Rail, MD [facility]

## 2023-01-27 ENCOUNTER — Other Ambulatory Visit: Payer: Self-pay

## 2023-01-30 ENCOUNTER — Other Ambulatory Visit: Payer: Self-pay

## 2023-02-02 ENCOUNTER — Other Ambulatory Visit: Payer: Self-pay

## 2023-02-09 ENCOUNTER — Other Ambulatory Visit: Payer: Self-pay

## 2023-02-16 ENCOUNTER — Other Ambulatory Visit: Payer: Self-pay

## 2023-03-09 ENCOUNTER — Other Ambulatory Visit: Payer: Self-pay

## 2023-03-10 ENCOUNTER — Other Ambulatory Visit: Payer: Self-pay | Admitting: Pharmacist

## 2023-03-10 ENCOUNTER — Other Ambulatory Visit: Payer: Self-pay

## 2023-03-10 DIAGNOSIS — I10 Essential (primary) hypertension: Secondary | ICD-10-CM

## 2023-03-10 DIAGNOSIS — I25810 Atherosclerosis of coronary artery bypass graft(s) without angina pectoris: Secondary | ICD-10-CM

## 2023-03-10 MED ORDER — SACUBITRIL-VALSARTAN 24-26 MG PO TABS
1.0000 | ORAL_TABLET | Freq: Two times a day (BID) | ORAL | 0 refills | Status: DC
Start: 2023-03-10 — End: 2023-03-30

## 2023-03-11 ENCOUNTER — Other Ambulatory Visit: Payer: Self-pay

## 2023-03-13 ENCOUNTER — Other Ambulatory Visit: Payer: Self-pay | Admitting: Internal Medicine

## 2023-03-13 ENCOUNTER — Other Ambulatory Visit: Payer: Self-pay

## 2023-03-13 DIAGNOSIS — E1169 Type 2 diabetes mellitus with other specified complication: Secondary | ICD-10-CM

## 2023-03-13 MED ORDER — TRULICITY 1.5 MG/0.5ML ~~LOC~~ SOAJ
1.5000 mg | SUBCUTANEOUS | 0 refills | Status: DC
Start: 2023-03-13 — End: 2023-03-30
  Filled 2023-03-13: qty 8, fill #0

## 2023-03-13 NOTE — Telephone Encounter (Signed)
Requested Prescriptions  Pending Prescriptions Disp Refills   Dulaglutide (TRULICITY) 1.5 MG/0.5ML SOPN 8 mL 0    Sig: INJECT 1.5 MG (0.5ML) UNDER THE SKIN ONCE A WEEK     Endocrinology:  Diabetes - GLP-1 Receptor Agonists Failed - 03/13/2023  1:57 PM      Failed - HBA1C is between 0 and 7.9 and within 180 days    HbA1c, POC (prediabetic range)  Date Value Ref Range Status  03/11/2022 6.4 5.7 - 6.4 % Final   HbA1c, POC (controlled diabetic range)  Date Value Ref Range Status  08/19/2022 6.8 0.0 - 7.0 % Final         Failed - Valid encounter within last 6 months    Recent Outpatient Visits           6 months ago Type 2 diabetes mellitus with obesity (HCC)   [place] [facility] Jonah Blue B, MD   1 year ago Type 2 diabetes mellitus with obesity [facility])   [place] [facility] Jon[facility] Blue B, MD   1 year ago Type 2 diabetes mellitus with obesity [facility])   [place] [facility] Jonah Blue B, MD   1 year ago Type 2 diabetes mellitus with other circulatory complication, without long-term current use of insulin [facility])   [place] [facility] Jonah Blue B, MD   2 years ago Type 2 diabetes mellitus with other circulatory complication, without long-term current use of insulin [facility])    [facility] Hoy Register, MD       Future Appointments             In 2 weeks Laural Benes, Binnie Rail, MD [facility]

## 2023-03-27 ENCOUNTER — Other Ambulatory Visit: Payer: Self-pay

## 2023-03-27 ENCOUNTER — Other Ambulatory Visit: Payer: Self-pay | Admitting: Internal Medicine

## 2023-03-27 DIAGNOSIS — I10 Essential (primary) hypertension: Secondary | ICD-10-CM

## 2023-03-27 DIAGNOSIS — I25810 Atherosclerosis of coronary artery bypass graft(s) without angina pectoris: Secondary | ICD-10-CM

## 2023-03-27 NOTE — Telephone Encounter (Signed)
Requested medication (s) are due for refill today: routing for review  Requested medication (s) are on the active medication list: yes  Last refill:  08/05/21  Future visit scheduled: yes  Notes to clinic:   Medication not assigned to a protocol, review manually.      Requested Prescriptions  Pending Prescriptions Disp Refills   sacubitril-valsartan (ENTRESTO) 24-26 MG 60 tablet 6    Sig: TAKE 1 TABLET BY MOUTH 2 (TWO) TIMES DAILY.     Off-Protocol Failed - 03/27/2023  3:21 PM      Failed - Medication not assigned to a protocol, review manually.      Passed - Valid encounter within last 12 months    Recent Outpatient Visits           7 months ago Type 2 diabetes mellitus with obesity (HCC)   [place] [facility] Jonah Blue B, MD   1 year ago Type 2 diabetes mellitus with obesity [facility])   [place] [facility] Jonah Blue B, MD   1 year ago Type 2 diabetes mellitus with obesity [facility])   [place] [facility] & [facility] Jonah Blue B, MD   1 year ago Type 2 diabetes mellitus with other circulatory complication, without long-term current use of insulin [facility])   [place] [facility] Jonah Blue B, MD   2 years ago Type 2 diabetes mellitus with other circulatory complication, without long-term current use of insulin [facility])   [place] [facility] Hoy Register, MD       Future Appointments             In 3 days Marcine Matar, MD [facility]

## 2023-03-30 ENCOUNTER — Other Ambulatory Visit: Payer: Self-pay

## 2023-03-30 ENCOUNTER — Ambulatory Visit: Payer: Self-pay | Attending: Internal Medicine | Admitting: Internal Medicine

## 2023-03-30 ENCOUNTER — Encounter: Payer: Self-pay | Admitting: Internal Medicine

## 2023-03-30 VITALS — BP 128/84 | HR 74 | Temp 98.7°F | Ht 69.0 in | Wt 235.0 lb

## 2023-03-30 DIAGNOSIS — E1169 Type 2 diabetes mellitus with other specified complication: Secondary | ICD-10-CM

## 2023-03-30 DIAGNOSIS — E119 Type 2 diabetes mellitus without complications: Secondary | ICD-10-CM

## 2023-03-30 DIAGNOSIS — I10 Essential (primary) hypertension: Secondary | ICD-10-CM

## 2023-03-30 DIAGNOSIS — I350 Nonrheumatic aortic (valve) stenosis: Secondary | ICD-10-CM

## 2023-03-30 DIAGNOSIS — I25810 Atherosclerosis of coronary artery bypass graft(s) without angina pectoris: Secondary | ICD-10-CM

## 2023-03-30 DIAGNOSIS — L989 Disorder of the skin and subcutaneous tissue, unspecified: Secondary | ICD-10-CM

## 2023-03-30 DIAGNOSIS — E669 Obesity, unspecified: Secondary | ICD-10-CM

## 2023-03-30 LAB — GLUCOSE, POCT (MANUAL RESULT ENTRY): POC Glucose: 226 mg/dl — AB (ref 70–99)

## 2023-03-30 LAB — POCT GLYCOSYLATED HEMOGLOBIN (HGB A1C): HbA1c, POC (controlled diabetic range): 9.9 % — AB (ref 0.0–7.0)

## 2023-03-30 MED ORDER — TRULICITY 3 MG/0.5ML ~~LOC~~ SOAJ
3.0000 mg | SUBCUTANEOUS | 4 refills | Status: DC
Start: 2023-03-30 — End: 2023-08-07
  Filled 2023-03-30: qty 2, 28d supply, fill #0
  Filled 2023-04-28 (×2): qty 2, 28d supply, fill #1

## 2023-03-30 MED ORDER — SACUBITRIL-VALSARTAN 24-26 MG PO TABS
1.0000 | ORAL_TABLET | Freq: Two times a day (BID) | ORAL | 1 refills | Status: DC
Start: 2023-03-30 — End: 2023-04-28
  Filled 2023-03-30: qty 180, 90d supply, fill #0

## 2023-03-30 NOTE — Progress Notes (Signed)
Patient ID: Johnny Nichols, male    DOB: 01/29/1966  MRN: [id number]  CC: Diabetes (DM & HTN f/u. Med refills. /Mole on R shoulder, itching - no change in color / size)   Subjective: Johnny Nichols is a 57 y.o. male who presents for chronic disease management His concerns today include:  DM, HTN, OSA, HL,obesity, CAD s/p CABGx4, PAF, mod AS    DM Results for orders placed or performed in visit on 03/30/23  POCT glycosylated hemoglobin (Hb A1C)  Result Value Ref Range   Hemoglobin A1C     HbA1c POC (<> result, manual entry)     HbA1c, POC (prediabetic range)     HbA1c, POC (controlled diabetic range) 9.9 (A) 0.0 - 7.0 %  POCT glucose (manual entry)  Result Value Ref Range   POC Glucose 226 (A) 70 - 99 mg/dl  W0J has significantly increased since last visit.  He reports compliance  with meds including Trulicity 1.5 mg, Metformin 1 gram BID and Glucotrol 5 mg.  Not checking BS.  Tolerating Trulicity Eating Habits:  "terrible" eating a lot of pasta, bread Not as active as he was in past due to increase work hrs.  Over due for eye exam.  No insurance  HTN/CAD/PAF/mod AS: Currently taking: see medication list -metoprolol 37.5 mg twice a day, atorvastatin 80 mg daily, Plavix 75 mg daily, Entresto 24/26 mg twice a day and aspirin. Was out of Entresto for about a mth as his PAP had expired.  Just restarted 3 days ago.   No CP/SOB/LE  Checks BP at home.  Usually 120s/70s.  Mole on RT shoulder x 1 yr No increase in size. Itches at times. 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     Ascorbic Acid (VITAMIN C) 1000 MG tablet Take 1,000 mg by mouth daily.     aspirin EC 81 MG EC tablet Take 1 tablet (81 mg total) by mouth daily. Swallow whole. 30 tablet 11   atorvastatin (LIPITOR) 80 MG tablet Take 1 tablet (80 mg total) by mouth daily. 90 tablet 1   Blood Glucose Monitoring Suppl (TRUE METRIX METER) w/Device KIT Use to check blood sugar as instructed. 1 kit 0   Cholecalciferol (VITAMIN D3 PO) Take 1 capsule by mouth daily.     clopidogrel (PLAVIX) 75 MG tablet TAKE 1 TABLET (75 MG TOTAL) BY MOUTH DAILY. (Must have office visit for refills) 90 tablet 0   Dulaglutide (TRULICITY) 1.5 MG/0.5ML SOPN Inject 1.5 mg into the skin once a week. 8 mL 0   ezetimibe (ZETIA) 10 MG tablet Take 1 tablet (10 mg total) by mouth daily.  90 tablet 1   glipiZIDE (GLUCOTROL) 5 MG tablet Take 1 tablet (5 mg total) by mouth 2 (two) times daily before a meal. 180 tablet 0   metFORMIN (GLUCOPHAGE) 1000 MG tablet TAKE 1 TABLET (1,000 MG TOTAL) BY MOUTH 2 (TWO) TIMES DAILY WITH A MEAL. 180 tablet 0   metoprolol tartrate (LOPRESSOR) 25 MG tablet TAKE 1.5 TABLETS (37.5 MG TOTAL) BY MOUTH 2 (TWO) TIMES DAILY. 270 tablet 0   sacubitril-valsartan (ENTRESTO) 24-26 MG TAKE 1 TABLET BY MOUTH 2 (TWO) TIMES DAILY. 60 tablet 0   TRUEPLUS LANCETS 28G MISC Use as instructed to check blood sugar daily. 100 each 11   No current facility-administered medications on file prior to visit.    No Known Allergies  Social History    Socioeconomic History   Marital status: Divorced    Spouse name: Not on file   Number of children: Not on file   Years of education: 16   Highest education level: Not on file  Occupational History   Occupation: works as Firefighter and a Engineer, technical sales  Tobacco Use   Smoking status: Never   Smokeless tobacco: Never  Vaping Use   Vaping status: Not on file  Substance and Sexual Activity   Alcohol use: Yes    Alcohol/week: 1.0 standard drink of alcohol    Types: 1 Cans of beer per week    Comment: socially   Drug use: No   Sexual activity: Not on file  Other Topics Concern   Not on file  Social History Narrative   Not on file   Social Determinants of Health   Financial Resource Strain: Not on file  Food Insecurity: Not on file  Transportation Needs: Not on file  Physical Activity: Not on file  Stress: Not on file  Social Connections: Not on file  Intimate Partner Violence: Not on file    Family History  Problem Relation Age of Onset   Heart disease Mother    Cancer Father    Hypertension Other    Obesity Other     Past Surgical History:  Procedure Laterality Date   CARDIAC CATHETERIZATION     CLIPPING OF ATRIAL APPENDAGE N/A 08/22/2020   Procedure: CLIPPING OF ATRIAL APPENDAGE USING ATRICURE ATRICLIP;  Surgeon: Delight Ovens, MD;  Location: [facility] OR;  Service: Open Heart Surgery;  Laterality: N/A;   CORONARY ARTERY BYPASS GRAFT N/A 08/22/2020   Procedure: CORONARY ARTERY BYPASS GRAFTING (CABG) TIMES FOUR, USING LEFT INTERNAL MAMMARY ARTERY, LEFT RADIAL ARTERY, AND RIGHT LEG GREATER SAPHENOUS VEIN HARVESTED ENDOSCOPICALLY;  Surgeon: Delight Ovens, MD;  Location: [facility] OR;  Service: Open Heart Surgery;  Laterality: N/A;   ENDOVEIN HARVEST OF GREATER SAPHENOUS VEIN Right 08/22/2020   Procedure: ENDOVEIN HARVEST OF GREATER SAPHENOUS VEIN;  Surgeon: Delight Ovens, MD;  Location: [facility] OR;  Service: Open Heart Surgery;  Laterality: Right;   LEFT HEART CATH AND CORONARY  ANGIOGRAPHY N/A 08/18/2020   Procedure: LEFT HEART CATH AND CORONARY ANGIOGRAPHY;  Surgeon: Corky Crafts, MD;  Location: [facility] INVASIVE CV LAB;  Service: Cardiovascular;  Laterality: N/A;   RADIAL ARTERY HARVEST Left 08/22/2020   Procedure: LEFT RADIAL ARTERY HARVEST;  Surgeon: Delight Ovens, MD;  Location: [facility] OR;  Service: Open Heart Surgery;  Laterality: Left;   TEE WITHOUT CARDIOVERSION N/A 08/22/2020   Procedure: TRANSESOPHAGEAL ECHOCARDIOGRAM (TEE);  Surgeon: Delight Ovens, MD;  Location: [facility] OR;  Service: Open Heart Surgery;  Laterality: N/A;    ROS: Review  of Systems Negative except as stated above  PHYSICAL EXAM: BP 135/86 (BP Location: Left Arm, Patient Position: Sitting, Cuff Size: Normal)   Pulse 74   Temp 98.7 F (37.1 C) (Oral)   Ht 5\' 9"  (1.753 m)   Wt 235 lb (106.6 kg)   SpO2 98%   BMI 34.70 kg/m   Wt Readings from Last 3 Encounters:  03/30/23 235 lb (106.6 kg)  08/19/22 237 lb (107.5 kg)  03/11/22 239 lb (108.4 kg)    Physical Exam   General appearance - alert, well appearing, middle-age Caucasian male and in no distress Mental status - normal mood, behavior, speech, dress, motor activity, and thought processes Neck - supple, no significant adenopathy Chest - clear to auscultation, no wheezes, rales or rhonchi, symmetric air entry Heart - normal rate, regular rhythm, normal S1, S2, 2-3/6 SEM along LSB Extremities - peripheral pulses normal, no pedal edema, no clubbing or cyanosis Skin -right shoulder: There is a 0.5 cm circular macular mild erythematous area.     Latest Ref Rng & Units 03/11/2022    4:53 PM 01/03/2021    9:12 AM 08/28/2020    9:43 AM  CMP  Glucose 70 - 99 mg/dL 102  93  725   BUN 6 - 24 mg/dL 13  14  12    Creatinine 0.76 - 1.27 mg/dL 3.66  4.40  3.47   Sodium 134 - 144 mmol/L 140  141  135   Potassium 3.5 - 5.2 mmol/L 4.4  4.6  4.2   Chloride 96 - 106 mmol/L 103  102  102   CO2 20 - 29 mmol/L 23  24  24    Calcium 8.7 -  10.2 mg/dL 42.5  9.4  9.3   Total Protein 6.0 - 8.5 g/dL 7.4  7.4    Total Bilirubin 0.0 - 1.2 mg/dL 0.5  0.3    Alkaline Phos 44 - 121 IU/L 66  74    AST 0 - 40 IU/L 15  19    ALT 0 - 44 IU/L 19  21     Lipid Panel     Component Value Date/Time   CHOL 127 03/11/2022 1653   TRIG 64 03/11/2022 1653   HDL 32 (L) 03/11/2022 1653   CHOLHDL 4.0 03/11/2022 1653   CHOLHDL 4.1 08/18/2020 0631   VLDL 13 08/18/2020 0631   LDLCALC 82 03/11/2022 1653    CBC    Component Value Date/Time   WBC 8.2 03/11/2022 1653   WBC 10.6 (H) 08/27/2020 0935   RBC 4.85 03/11/2022 1653   RBC 4.13 (L) 08/27/2020 0935   HGB 13.8 03/11/2022 1653   HCT 42.5 03/11/2022 1653   PLT 286 03/11/2022 1653   MCV 88 03/11/2022 1653   MCH 28.5 03/11/2022 1653   MCH 29.3 08/27/2020 0935   MCHC 32.5 03/11/2022 1653   MCHC 34.1 08/27/2020 0935   RDW 13.0 03/11/2022 1653   LYMPHSABS 1.7 08/18/2020 0100   LYMPHSABS 2.2 06/04/2020 1440   MONOABS 1.0 08/18/2020 0100   EOSABS 0.4 08/18/2020 0100   EOSABS 1.2 (H) 06/04/2020 1440   BASOSABS 0.1 08/18/2020 0100   BASOSABS 0.1 06/04/2020 1440    ASSESSMENT AND PLAN: 1. Type 2 diabetes mellitus with obesity (HCC) Not at goal. Discussed on encourage healthy eating habits.  Encouraged him to try to get in some form of moderate intensity exercise at least 3 to 5 days a week for 30 minutes. We discussed adding evening dose of Lantus insulin  versus increasing Trulicity.  Patient prefers to increase the Trulicity.  This was increased to 3 mg once a week.  He will continue metformin 1 g twice a day and glipizide 5 mg daily. - POCT glycosylated hemoglobin (Hb A1C) - POCT glucose (manual entry) - Dulaglutide (TRULICITY) 3 MG/0.5ML SOPN; Inject 3 mg as directed once a week.  Dispense: 2 mL; Refill: 4 - CBC; Future - Comprehensive metabolic panel; Future  2. Essential hypertension Close to goal.  He reports good blood pressure readings at home.  He will continue metoprolol 37.5  mg twice a day and Entresto 24/26 mg twice a day. - sacubitril-valsartan (ENTRESTO) 24-26 MG; TAKE 1 TABLET BY MOUTH 2 (TWO) TIMES DAILY.  Dispense: 180 tablet; Refill: 1  3. 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  Continue Entresto, metoprolol, Plavix and aspirin - sacubitril-valsartan (ENTRESTO) 24-26 MG; TAKE 1 TABLET BY MOUTH 2 (TWO) TIMES DAILY.  Dispense: 180 tablet; Refill: 1 - Lipid panel; Future  4. Aortic stenosis, moderate Asymptomatic.  Patient advised to apply for Medicaid.  Hopefully if he has it on next visit, we can get an updated echo and get him back in with cardiology.  5. Skin lesion Does not look like a typical mole.  However once he has Medicaid, we can refer to dermatology.  Advised to use some hydrocortisone cream over-the-counter when it itches.     Patient was given the opportunity to ask questions.  Patient verbalized understanding of the plan and was able to repeat key elements of the plan.   This documentation was completed using Paediatric nurse.  Any transcriptional errors are unintentional.  Orders Placed This Encounter  Procedures   POCT glycosylated hemoglobin (Hb A1C)   POCT glucose (manual entry)     Requested Prescriptions   Pending Prescriptions Disp Refills   sacubitril-valsartan (ENTRESTO) 24-26 MG 90 tablet 1    Sig: TAKE 1 TABLET BY MOUTH 2 (TWO) TIMES DAILY.    No follow-ups on file.  Jonah Blue, MD, FACP

## 2023-04-01 ENCOUNTER — Other Ambulatory Visit: Payer: Self-pay

## 2023-04-03 ENCOUNTER — Other Ambulatory Visit: Payer: Self-pay

## 2023-04-07 ENCOUNTER — Other Ambulatory Visit: Payer: Self-pay

## 2023-04-28 ENCOUNTER — Other Ambulatory Visit: Payer: Self-pay | Admitting: Internal Medicine

## 2023-04-28 ENCOUNTER — Other Ambulatory Visit: Payer: Self-pay | Admitting: Pharmacist

## 2023-04-28 ENCOUNTER — Other Ambulatory Visit: Payer: Self-pay

## 2023-04-28 DIAGNOSIS — I25810 Atherosclerosis of coronary artery bypass graft(s) without angina pectoris: Secondary | ICD-10-CM

## 2023-04-28 DIAGNOSIS — E1169 Type 2 diabetes mellitus with other specified complication: Secondary | ICD-10-CM

## 2023-04-28 DIAGNOSIS — E1159 Type 2 diabetes mellitus with other circulatory complications: Secondary | ICD-10-CM

## 2023-04-28 DIAGNOSIS — I10 Essential (primary) hypertension: Secondary | ICD-10-CM

## 2023-04-28 MED ORDER — CLOPIDOGREL BISULFATE 75 MG PO TABS
75.0000 mg | ORAL_TABLET | Freq: Every day | ORAL | 0 refills | Status: DC
Start: 2023-04-28 — End: 2023-09-04
  Filled 2023-04-28: qty 90, 90d supply, fill #0

## 2023-04-28 MED ORDER — SACUBITRIL-VALSARTAN 24-26 MG PO TABS
1.0000 | ORAL_TABLET | Freq: Two times a day (BID) | ORAL | 1 refills | Status: DC
Start: 2023-04-28 — End: 2024-03-09

## 2023-04-28 MED ORDER — GLIPIZIDE 5 MG PO TABS
5.0000 mg | ORAL_TABLET | Freq: Two times a day (BID) | ORAL | 0 refills | Status: DC
  Filled 2023-04-28: qty 180, 90d supply, fill #0

## 2023-04-28 MED ORDER — METFORMIN HCL 1000 MG PO TABS
ORAL_TABLET | Freq: Two times a day (BID) | ORAL | 0 refills | Status: DC
Start: 2023-04-28 — End: 2023-09-04
  Filled 2023-04-28: qty 180, 90d supply, fill #0

## 2023-04-28 MED ORDER — METOPROLOL TARTRATE 25 MG PO TABS
37.5000 mg | ORAL_TABLET | Freq: Two times a day (BID) | ORAL | 0 refills | Status: DC
Start: 2023-04-28 — End: 2023-09-04
  Filled 2023-04-28: qty 270, 90d supply, fill #0

## 2023-04-29 ENCOUNTER — Other Ambulatory Visit: Payer: Self-pay

## 2023-04-30 ENCOUNTER — Other Ambulatory Visit: Payer: Self-pay

## 2023-05-01 ENCOUNTER — Other Ambulatory Visit: Payer: Self-pay

## 2023-05-06 ENCOUNTER — Other Ambulatory Visit: Payer: Self-pay

## 2023-05-08 ENCOUNTER — Other Ambulatory Visit: Payer: Self-pay

## 2023-05-08 ENCOUNTER — Telehealth: Payer: Self-pay

## 2023-05-08 NOTE — Telephone Encounter (Signed)
Submitted application for TRULICITY 3MG/0.5ML to [employer] for patient assistance.   Phone: [phone]  

## 2023-05-11 ENCOUNTER — Other Ambulatory Visit: Payer: Self-pay

## 2023-07-17 ENCOUNTER — Emergency Department (HOSPITAL_BASED_OUTPATIENT_CLINIC_OR_DEPARTMENT_OTHER)
Admission: EM | Admit: 2023-07-17 | Discharge: 2023-07-18 | Disposition: A | Discharge status: Home / Self Care | Payer: Self-pay | Attending: Emergency Medicine | Admitting: Emergency Medicine

## 2023-07-17 ENCOUNTER — Encounter (HOSPITAL_BASED_OUTPATIENT_CLINIC_OR_DEPARTMENT_OTHER): Payer: Self-pay | Admitting: Emergency Medicine

## 2023-07-17 ENCOUNTER — Other Ambulatory Visit: Payer: Self-pay

## 2023-07-17 DIAGNOSIS — L03221 Cellulitis of neck: Secondary | ICD-10-CM | POA: Insufficient documentation

## 2023-07-17 DIAGNOSIS — E119 Type 2 diabetes mellitus without complications: Secondary | ICD-10-CM | POA: Insufficient documentation

## 2023-07-17 DIAGNOSIS — Z7902 Long term (current) use of antithrombotics/antiplatelets: Secondary | ICD-10-CM | POA: Insufficient documentation

## 2023-07-17 DIAGNOSIS — Z7984 Long term (current) use of oral hypoglycemic drugs: Secondary | ICD-10-CM | POA: Insufficient documentation

## 2023-07-17 DIAGNOSIS — Z7982 Long term (current) use of aspirin: Secondary | ICD-10-CM | POA: Insufficient documentation

## 2023-07-17 MED ORDER — DOXYCYCLINE HYCLATE 100 MG PO CAPS: 100.0000 mg | ORAL_CAPSULE | Freq: Two times a day (BID) | ORAL | 0 refills | Status: DC

## 2023-07-17 MED ORDER — AMOXICILLIN-POT CLAVULANATE 875-125 MG PO TABS
1.0000 | ORAL_TABLET | Freq: Once | ORAL | Status: AC
  Administered 2023-07-18: 1 via ORAL
  Filled 2023-07-17: qty 1

## 2023-07-17 MED ORDER — DOXYCYCLINE HYCLATE 100 MG PO TABS
100.0000 mg | ORAL_TABLET | Freq: Once | ORAL | Status: AC
  Administered 2023-07-18: 100 mg via ORAL
  Filled 2023-07-17: qty 1

## 2023-07-17 MED ORDER — AMOXICILLIN-POT CLAVULANATE 875-125 MG PO TABS: 1.0000 | ORAL_TABLET | Freq: Two times a day (BID) | ORAL | 0 refills | Status: DC

## 2023-07-17 NOTE — ED Notes (Signed)
ED Provider at bedside. 

## 2023-07-17 NOTE — ED Triage Notes (Signed)
Patient reports "pimple" on back of neck that he tried popping a few days ago, and reports that it is now hard and he is concerned about it being a cyst. Patient has red bump on back of neck approx 2 inches wide.

## 2023-07-18 NOTE — ED Provider Notes (Signed)
[facility] Provider Note   CSN: [id number] Arrival date & time: 07/17/23  2024     History  Chief Complaint  Patient presents with   Neck Pain    Johnny Nichols is a 57 y.o. male.  The history is provided by the patient.  Patient with history of diabetes presents for possible abscess to his neck No fevers or vomiting.  He is unable to see the wound, but appears to be enlarging and more painful.  He reports it hurts to move his neck. He reports good control of his glucose     Home Medications Prior to Admission medications   Medication Sig Start Date End Date Taking? Authorizing Provider  amoxicillin-clavulanate (AUGMENTIN) 875-125 MG tablet Take 1 tablet by mouth every 12 (twelve) hours. 07/17/23  Yes Zadie Rhine, MD  doxycycline (VIBRAMYCIN) 100 MG capsule Take 1 capsule (100 mg total) by mouth 2 (two) times daily. One po bid x 7 days 07/17/23  Yes Zadie Rhine, MD  acetaminophen (TYLENOL) 325 MG tablet Take 2 tablets (650 mg total) by mouth every 6 (six) hours as needed for mild pain. 08/28/20   Sharlene Dory, PA-C  Ascorbic Acid (VITAMIN C) 1000 MG tablet Take 1,000 mg by mouth daily.    [provider]  aspirin EC 81 MG EC tablet Take 1 tablet (81 mg total) by mouth daily. Swallow whole. 08/28/20   Ardelle Balls, PA-C  atorvastatin (LIPITOR) 80 MG tablet Take 1 tablet (80 mg total) by mouth daily. 01/23/23   Marcine Matar, MD  Blood Glucose Monitoring Suppl (TRUE METRIX METER) w/Device KIT Use to check blood sugar as instructed. 05/14/20   Rema Fendt, NP  Cholecalciferol (VITAMIN D3 PO) Take 1 capsule by mouth daily.    [provider]  clopidogrel (PLAVIX) 75 MG tablet TAKE 1 TABLET (75 MG TOTAL) BY MOUTH DAILY. (Must have office visit for refills) 04/28/23   Marcine Matar, MD  Dulaglutide (TRULICITY) 3 MG/0.5ML SOPN Inject 3 mg as directed once a week. 03/30/23   Marcine Matar, MD  ezetimibe (ZETIA) 10 MG tablet Take 1 tablet (10 mg total) by mouth daily. 01/23/23   Marcine Matar, MD  glipiZIDE (GLUCOTROL) 5 MG tablet Take 1 tablet (5 mg total) by mouth 2 (two) times daily before a meal. 04/28/23   Marcine Matar, MD  metFORMIN (GLUCOPHAGE) 1000 MG tablet TAKE 1 TABLET (1,000 MG TOTAL) BY MOUTH 2 (TWO) TIMES DAILY WITH A MEAL. 04/28/23   Marcine Matar, MD  metoprolol tartrate (LOPRESSOR) 25 MG tablet TAKE 1.5 TABLETS (37.5 MG TOTAL) BY MOUTH 2 (TWO) TIMES DAILY. 04/28/23   Marcine Matar, MD  sacubitril-valsartan (ENTRESTO) 24-26 MG TAKE 1 TABLET BY MOUTH 2 (TWO) TIMES DAILY. 04/28/23   Marcine Matar, MD  TRUEPLUS LANCETS 28G MISC Use as instructed to check blood sugar daily. 07/02/18   Marcine Matar, MD      Allergies    Patient has no known allergies.    Review of Systems   Review of Systems  Physical Exam Updated Vital Signs BP (!) 144/78 (BP Location: Right Arm)   Pulse 91   Temp 98.9 F (37.2 C) (Oral)   Resp 18   Wt 104.3 kg   SpO2 98%   BMI 33.97 kg/m  Physical Exam CONSTITUTIONAL: Well developed/well nourished HEAD: Normocephalic/atraumatic EYES: EOMI/PERRL ENMT: Mucous membranes moist NECK: supple no meningeal signs Indurated area  with erythema to posterior neck.  No crepitus.  No discharge.  No fluctuance. NEURO: Pt is awake/alert/appropriate, moves all extremitiesx4.  No facial droop.   SKIN: warm, color normal PSYCH: no abnormalities of mood noted, alert and oriented to situation    ED Results / Procedures / Treatments   Labs (all labs ordered are listed, but only abnormal results are displayed) Labs Reviewed - No data to display  EKG None  Radiology No results found.  Procedures Ultrasound ED Soft Tissue  Date/Time: 07/18/2023 12:28 AM  Performed by: Zadie Rhine, MD Authorized by: Zadie Rhine, MD   Procedure details:    Indications: localization of abscess     Transverse view:   Visualized   Longitudinal view:  Visualized   Images: archived     Limitations:  Positioning Location:    Location: neck     Side:  Midline Findings:     no abscess present    cellulitis present     Medications Ordered in ED Medications  amoxicillin-clavulanate (AUGMENTIN) 875-125 MG per tablet 1 tablet (1 tablet Oral Given 07/18/23 0004)  doxycycline (VIBRA-TABS) tablet 100 mg (100 mg Oral Given 07/18/23 0004)    ED Course/ Medical Decision Making/ A&P                                 Medical Decision Making Risk Prescription drug management.   Patient presents with area of erythema to the posterior neck He likely has cellulitis but possible early abscess.  Bedside ultrasound does not reveal a fluid collection.  Will start dual antibiotic therapy. We discussed strict return precautions       Final Clinical Impression(s) / ED Diagnoses Final diagnoses:  Cellulitis of neck    Rx / DC Orders ED Discharge Orders          Ordered    amoxicillin-clavulanate (AUGMENTIN) 875-125 MG tablet  Every 12 hours        07/17/23 2356    doxycycline (VIBRAMYCIN) 100 MG capsule  2 times daily        07/17/23 2356              Zadie Rhine, MD 07/18/23 0031

## 2023-08-03 ENCOUNTER — Ambulatory Visit: Payer: Self-pay | Admitting: Internal Medicine

## 2023-08-07 ENCOUNTER — Other Ambulatory Visit: Payer: Self-pay

## 2023-08-07 ENCOUNTER — Other Ambulatory Visit: Payer: Self-pay | Admitting: Pharmacist

## 2023-08-07 DIAGNOSIS — E1169 Type 2 diabetes mellitus with other specified complication: Secondary | ICD-10-CM

## 2023-08-07 MED ORDER — TRULICITY 3 MG/0.5ML ~~LOC~~ SOAJ
3.0000 mg | SUBCUTANEOUS | 1 refills | Status: DC
Start: 2023-08-07 — End: 2023-09-28

## 2023-08-10 ENCOUNTER — Ambulatory Visit: Payer: Self-pay | Attending: Family Medicine

## 2023-08-10 DIAGNOSIS — E1169 Type 2 diabetes mellitus with other specified complication: Secondary | ICD-10-CM

## 2023-08-10 DIAGNOSIS — I25810 Atherosclerosis of coronary artery bypass graft(s) without angina pectoris: Secondary | ICD-10-CM

## 2023-08-11 LAB — LIPID PANEL
Chol/HDL Ratio: 2.9 {ratio} (ref 0.0–5.0)
Cholesterol, Total: 112 mg/dL (ref 100–199)
HDL: 38 mg/dL — ABNORMAL LOW (ref >39)
LDL Chol Calc (NIH): 62 mg/dL (ref 0–99)
Triglycerides: 51 mg/dL (ref 0–149)
VLDL Cholesterol Cal: 12 mg/dL (ref 5–40)

## 2023-08-11 LAB — COMPREHENSIVE METABOLIC PANEL
ALT: 23 [IU]/L (ref 0–44)
AST: 16 [IU]/L (ref 0–40)
Albumin: 4.3 g/dL (ref 3.8–4.9)
Alkaline Phosphatase: 76 [IU]/L (ref 44–121)
BUN/Creatinine Ratio: 12 (ref 9–20)
BUN: 11 mg/dL (ref 6–24)
Bilirubin Total: 0.6 mg/dL (ref 0.0–1.2)
CO2: 22 mmol/L (ref 20–29)
Calcium: 9 mg/dL (ref 8.7–10.2)
Chloride: 105 mmol/L (ref 96–106)
Creatinine, Ser: 0.94 mg/dL (ref 0.76–1.27)
Globulin, Total: 2.8 g/dL (ref 1.5–4.5)
Glucose: 156 mg/dL — ABNORMAL HIGH (ref 70–99)
Potassium: 4.7 mmol/L (ref 3.5–5.2)
Sodium: 141 mmol/L (ref 134–144)
Total Protein: 7.1 g/dL (ref 6.0–8.5)
eGFR: 95 mL/min/{1.73_m2} (ref >59)

## 2023-08-11 LAB — CBC
Hematocrit: 43.9 % (ref 37.5–51.0)
Hemoglobin: 14 g/dL (ref 13.0–17.7)
MCH: 28.6 pg (ref 26.6–33.0)
MCHC: 31.9 g/dL (ref 31.5–35.7)
MCV: 90 fL (ref 79–97)
Platelets: 294 10*3/uL (ref 150–450)
RBC: 4.89 x10E6/uL (ref 4.14–5.80)
RDW: 13.1 % (ref 11.6–15.4)
WBC: 11.2 10*3/uL — ABNORMAL HIGH (ref 3.4–10.8)

## 2023-08-12 ENCOUNTER — Other Ambulatory Visit: Payer: Self-pay

## 2023-08-28 ENCOUNTER — Other Ambulatory Visit: Payer: Self-pay

## 2023-09-04 ENCOUNTER — Other Ambulatory Visit: Payer: Self-pay

## 2023-09-04 ENCOUNTER — Other Ambulatory Visit: Payer: Self-pay | Admitting: Internal Medicine

## 2023-09-04 DIAGNOSIS — I152 Hypertension secondary to endocrine disorders: Secondary | ICD-10-CM

## 2023-09-04 DIAGNOSIS — E1169 Type 2 diabetes mellitus with other specified complication: Secondary | ICD-10-CM

## 2023-09-04 DIAGNOSIS — E785 Hyperlipidemia, unspecified: Secondary | ICD-10-CM

## 2023-09-04 DIAGNOSIS — E1159 Type 2 diabetes mellitus with other circulatory complications: Secondary | ICD-10-CM

## 2023-09-04 DIAGNOSIS — E669 Obesity, unspecified: Secondary | ICD-10-CM

## 2023-09-04 DIAGNOSIS — I25810 Atherosclerosis of coronary artery bypass graft(s) without angina pectoris: Secondary | ICD-10-CM

## 2023-09-04 MED ORDER — METOPROLOL TARTRATE 25 MG PO TABS
37.5000 mg | ORAL_TABLET | Freq: Two times a day (BID) | ORAL | 0 refills | Status: DC
  Filled 2023-09-04: qty 270, 90d supply, fill #0

## 2023-09-04 MED ORDER — METFORMIN HCL 1000 MG PO TABS
ORAL_TABLET | Freq: Two times a day (BID) | ORAL | 0 refills | Status: DC
  Filled 2023-09-04: qty 180, 90d supply, fill #0

## 2023-09-04 MED ORDER — GLIPIZIDE 5 MG PO TABS
5.0000 mg | ORAL_TABLET | Freq: Two times a day (BID) | ORAL | 0 refills | Status: DC
  Filled 2023-09-04: qty 180, 90d supply, fill #0

## 2023-09-04 MED ORDER — EZETIMIBE 10 MG PO TABS
10.0000 mg | ORAL_TABLET | Freq: Every day | ORAL | 1 refills | Status: DC
  Filled 2023-09-04: qty 90, 90d supply, fill #0
  Filled 2023-11-20 – 2023-11-30 (×3): qty 90, 90d supply, fill #1

## 2023-09-04 MED ORDER — CLOPIDOGREL BISULFATE 75 MG PO TABS
75.0000 mg | ORAL_TABLET | Freq: Every day | ORAL | 0 refills | Status: DC
  Filled 2023-09-04: qty 90, 90d supply, fill #0

## 2023-09-04 MED ORDER — ATORVASTATIN CALCIUM 80 MG PO TABS
80.0000 mg | ORAL_TABLET | Freq: Every day | ORAL | 1 refills | Status: DC
  Filled 2023-09-04: qty 90, 90d supply, fill #0
  Filled 2023-11-20 – 2023-11-30 (×3): qty 90, 90d supply, fill #1

## 2023-09-07 ENCOUNTER — Other Ambulatory Visit: Payer: Self-pay

## 2023-09-16 ENCOUNTER — Other Ambulatory Visit: Payer: Self-pay

## 2023-09-28 ENCOUNTER — Other Ambulatory Visit: Payer: Self-pay | Admitting: Internal Medicine

## 2023-09-28 DIAGNOSIS — E1169 Type 2 diabetes mellitus with other specified complication: Secondary | ICD-10-CM

## 2023-10-02 ENCOUNTER — Encounter: Payer: Self-pay | Admitting: Internal Medicine

## 2023-10-02 ENCOUNTER — Other Ambulatory Visit: Payer: Self-pay | Admitting: Pharmacist

## 2023-10-02 ENCOUNTER — Other Ambulatory Visit: Payer: Self-pay

## 2023-10-02 ENCOUNTER — Ambulatory Visit: Payer: Self-pay | Attending: Internal Medicine | Admitting: Internal Medicine

## 2023-10-02 VITALS — BP 122/82 | HR 76 | Temp 97.8°F | Ht 69.0 in | Wt 233.0 lb

## 2023-10-02 DIAGNOSIS — Z7985 Long-term (current) use of injectable non-insulin antidiabetic drugs: Secondary | ICD-10-CM

## 2023-10-02 DIAGNOSIS — E669 Obesity, unspecified: Secondary | ICD-10-CM

## 2023-10-02 DIAGNOSIS — E119 Type 2 diabetes mellitus without complications: Secondary | ICD-10-CM

## 2023-10-02 DIAGNOSIS — I350 Nonrheumatic aortic (valve) stenosis: Secondary | ICD-10-CM

## 2023-10-02 DIAGNOSIS — I25810 Atherosclerosis of coronary artery bypass graft(s) without angina pectoris: Secondary | ICD-10-CM

## 2023-10-02 DIAGNOSIS — E1159 Type 2 diabetes mellitus with other circulatory complications: Secondary | ICD-10-CM

## 2023-10-02 DIAGNOSIS — Z7984 Long term (current) use of oral hypoglycemic drugs: Secondary | ICD-10-CM

## 2023-10-02 DIAGNOSIS — Z23 Encounter for immunization: Secondary | ICD-10-CM

## 2023-10-02 DIAGNOSIS — I152 Hypertension secondary to endocrine disorders: Secondary | ICD-10-CM

## 2023-10-02 DIAGNOSIS — Z1211 Encounter for screening for malignant neoplasm of colon: Secondary | ICD-10-CM

## 2023-10-02 DIAGNOSIS — E1169 Type 2 diabetes mellitus with other specified complication: Secondary | ICD-10-CM

## 2023-10-02 LAB — POCT GLYCOSYLATED HEMOGLOBIN (HGB A1C): HbA1c, POC (controlled diabetic range): 8.1 % — AB (ref 0.0–7.0)

## 2023-10-02 LAB — GLUCOSE, POCT (MANUAL RESULT ENTRY): POC Glucose: 146 mg/dL — AB (ref 70–99)

## 2023-10-02 MED ORDER — TRUE METRIX METER W/DEVICE KIT
PACK | 0 refills | Status: AC
  Filled 2023-10-02: qty 1, 30d supply, fill #0

## 2023-10-02 MED ORDER — TRUE METRIX BLOOD GLUCOSE TEST VI STRP
ORAL_STRIP | 6 refills | Status: AC
  Filled 2023-10-02: qty 100, 30d supply, fill #0
  Filled 2023-11-20 – 2024-03-01 (×2): qty 100, 100d supply, fill #0

## 2023-10-02 MED ORDER — TRUEPLUS LANCETS 28G MISC
6 refills | Status: AC
  Filled 2023-10-02: qty 100, 30d supply, fill #0
  Filled 2024-03-01: qty 100, 100d supply, fill #0

## 2023-10-02 MED ORDER — GLIPIZIDE 10 MG PO TABS
10.0000 mg | ORAL_TABLET | Freq: Two times a day (BID) | ORAL | 1 refills | Status: DC
  Filled 2023-10-02: qty 180, 90d supply, fill #0
  Filled 2023-11-20 – 2024-02-03 (×3): qty 180, 90d supply, fill #1

## 2023-10-02 NOTE — Progress Notes (Signed)
Patient ID: Johnny Nichols, male    DOB: July 31, 1966  MRN: [id number]  CC: Diabetes (DM f/u. Herold Harms new glucometer/Discuss pneumonia vax. )   Subjective: Johnny Nichols is a 58 y.o. male who presents for chronic ds management. His concerns today include:  DM, HTN, OSA, HL,obesity, CAD s/p CABGx4, PAF, mod AS   Has not applied for Medicaid as yet.  DM: Results for orders placed or performed in visit on 10/02/23  POCT glucose (manual entry)   Collection Time: 10/02/23  2:40 PM  Result Value Ref Range   POC Glucose 146 (A) 70 - 99 mg/dl  POCT glycosylated hemoglobin (Hb A1C)   Collection Time: 10/02/23  2:57 PM  Result Value Ref Range   Hemoglobin A1C     HbA1c POC (<> result, manual entry)     HbA1c, POC (prediabetic range)     HbA1c, POC (controlled diabetic range) 8.1 (A) 0.0 - 7.0 %  A1C has improve from 9.9 on last visit Should be on Trulicity 3.0 mg, Metformin 1 gram BID and Glucotrol 5 mg BID. Takes consistently but some days he forgets to take his a.m dose of Metformin/Glucotrol before leaving for work Still eating a lot of carbs because less costly; trying to eat more proteins.  Has reduce ETOH intake to 1 drink Q 2 wks Misplaced glucometer; request rxn for new one  HTN/CAD/PAF/mod AS: On metoprolol 37.5 mg twice a day, atorvastatin 80 mg daily, Zetia 10 mg daily, Plavix 75 mg daily, Entresto 24/26 mg twice a day and aspirin.  -misplaced BP device -little CP 2 wks ago when he was in the cold air. 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     Ascorbic Acid (VITAMIN C) 1000 MG tablet Take 1,000 mg by mouth daily.     aspirin EC 81 MG EC tablet Take 1 tablet (81 mg total) by mouth daily. Swallow whole. 30 tablet 11   atorvastatin (LIPITOR) 80 MG tablet Take 1 tablet (80 mg total) by mouth daily. 90 tablet 1   Blood Glucose Monitoring Suppl (TRUE METRIX METER) w/Device KIT Use to check blood sugar as instructed. 1 kit 0   Cholecalciferol (VITAMIN D3 PO) Take 1 capsule by mouth daily.     clopidogrel (PLAVIX) 75 MG tablet TAKE 1 TABLET (75 MG TOTAL) BY MOUTH DAILY. (Must have office visit for refills) 90 tablet 0   ezetimibe (ZETIA) 10 MG tablet Take 1 tablet (10 mg total) by mouth daily. 90 tablet 1   glipiZIDE (GLUCOTROL) 5 MG tablet Take 1 tablet (5 mg total) by mouth 2 (two) times  daily before a meal. 180 tablet 0   metFORMIN (GLUCOPHAGE) 1000 MG tablet TAKE 1 TABLET (1,000 MG TOTAL) BY MOUTH 2 (TWO) TIMES DAILY WITH A MEAL. 180 tablet 0   metoprolol tartrate (LOPRESSOR) 25 MG tablet TAKE 1.5 TABLETS (37.5 MG TOTAL) BY MOUTH 2 (TWO) TIMES DAILY. 270 tablet 0   sacubitril-valsartan (ENTRESTO) 24-26 MG TAKE 1 TABLET BY MOUTH 2 (TWO) TIMES DAILY. 180 tablet 1   TRUEPLUS LANCETS 28G MISC Use as instructed to check blood sugar daily. 100 each 11   TRULICITY 3 MG/0.5ML SOAJ INJECT 3 MG (0.5 ML) UNDER THE SKIN ONCE A WEEK 2 mL 0   No current facility-administered medications on file prior to visit.    No Known Allergies  Social History    Socioeconomic History   Marital status: Divorced    Spouse name: Not on file   Number of children: Not on file   Years of education: 16   Highest education level: Not on file  Occupational History   Occupation: works as Firefighter and a Engineer, technical sales  Tobacco Use   Smoking status: Never   Smokeless tobacco: Never  Vaping Use   Vaping status: Not on file  Substance and Sexual Activity   Alcohol use: Yes    Alcohol/week: 1.0 standard drink of alcohol    Types: 1 Cans of beer per week    Comment: socially   Drug use: No   Sexual activity: Not on file  Other Topics Concern   Not on file  Social History Narrative   Not on file   Social Drivers of Health   Financial Resource Strain: Not on file  Food Insecurity: Not on file  Transportation Needs: Not on file  Physical Activity: Not on file  Stress: Not on file  Social Connections: Not on file  Intimate Partner Violence: Not on file    Family History  Problem Relation Age of Onset   Heart disease Mother    Cancer Father    Hypertension Other    Obesity Other     Past Surgical History:  Procedure Laterality Date   CARDIAC CATHETERIZATION     CLIPPING OF ATRIAL APPENDAGE N/A 08/22/2020   Procedure: CLIPPING OF ATRIAL APPENDAGE USING ATRICURE ATRICLIP;  Surgeon: Delight Ovens, MD;  Location: [facility] OR;  Service: Open Heart Surgery;  Laterality: N/A;   CORONARY ARTERY BYPASS GRAFT N/A 08/22/2020   Procedure: CORONARY ARTERY BYPASS GRAFTING (CABG) TIMES FOUR, USING LEFT INTERNAL MAMMARY ARTERY, LEFT RADIAL ARTERY, AND RIGHT LEG GREATER SAPHENOUS VEIN HARVESTED ENDOSCOPICALLY;  Surgeon: Delight Ovens, MD;  Location[facility] OR;  Service: Open Heart Surgery;  Laterality: N/A;   ENDOVEIN HARVEST OF GREATER SAPHENOUS VEIN Right 08/22/2020   Procedure: ENDOVEIN HARVEST OF GREATER SAPHENOUS VEIN;  Surgeon: Delight Ovens, MD;  Location: [facility] OR;  Service: Open Heart Surgery;  Laterality: Right;   LEFT HEART CATH AND CORONARY  ANGIOGRAPHY N/A 08/18/2020   Procedure: LEFT HEART CATH AND CORONARY ANGIOGRAPHY;  Surgeon: Corky Crafts, MD;  Location: [facility] INVASIVE CV LAB;  Service: Cardiovascular;  Laterality: N/A;   RADIAL ARTERY HARVEST Left 08/22/2020   Procedure: LEFT RADIAL ARTERY HARVEST;  Surgeon: Delight Ovens, MD;  Location: [facility] OR;  Service: Open Heart Surgery;  Laterality: Left;   TEE WITHOUT CARDIOVERSION N/A 08/22/2020   Procedure: TRANSESOPHAGEAL ECHOCARDIOGRAM (TEE);  Surgeon: Delight Ovens, MD;  Location: [facility] OR;  Service: Open Heart Surgery;  Laterality: N/A;    ROS: Review of  Systems Negative except as stated above  PHYSICAL EXAM: BP 130/84 (BP Location: Left Arm, Patient Position: Sitting, Cuff Size: Normal)   Pulse 76   Temp 97.8 F (36.6 C) (Oral)   Ht 5\' 9"  (1.753 m)   Wt 233 lb (105.7 kg)   SpO2 98%   BMI 34.41 kg/m   Wt Readings from Last 3 Encounters:  10/02/23 233 lb (105.7 kg)  07/17/23 230 lb (104.3 kg)  03/30/23 235 lb (106.6 kg)    Physical Exam General appearance - alert, well appearing, middle-age Caucasian male and in no distress Mental status - normal mood, behavior, speech, dress, motor activity, and thought processes Neck - supple, no significant adenopathy Chest - clear to auscultation, no wheezes, rales or rhonchi, symmetric air entry Heart -regular rate rhythm.  2 out of 6 diastolic murmur heard along the left upper sternal border Extremities - peripheral pulses normal, no pedal edema, no clubbing or cyanosis     Latest Ref Rng & Units 08/10/2023    8:46 AM 03/11/2022    4:53 PM 01/03/2021    9:12 AM  CMP  Glucose 70 - 99 mg/dL 960  454  93   BUN 6 - 24 mg/dL 11  13  14    Creatinine 0.76 - 1.27 mg/dL 0.98  1.19  1.47   Sodium 134 - 144 mmol/L 141  140  141   Potassium 3.5 - 5.2 mmol/L 4.7  4.4  4.6   Chloride 96 - 106 mmol/L 105  103  102   CO2 20 - 29 mmol/L 22  23  24    Calcium 8.7 - 10.2 mg/dL 9.0  82.9  9.4   Total Protein 6.0 - 8.5 g/dL 7.1   7.4  7.4   Total Bilirubin 0.0 - 1.2 mg/dL 0.6  0.5  0.3   Alkaline Phos 44 - 121 IU/L 76  66  74   AST 0 - 40 IU/L 16  15  19    ALT 0 - 44 IU/L 23  19  21     Lipid Panel     Component Value Date/Time   CHOL 112 08/10/2023 0846   TRIG 51 08/10/2023 0846   HDL 38 (L) 08/10/2023 0846   CHOLHDL 2.9 08/10/2023 0846   CHOLHDL 4.1 08/18/2020 0631   VLDL 13 08/18/2020 0631   LDLCALC 62 08/10/2023 0846    CBC    Component Value Date/Time   WBC 11.2 (H) 08/10/2023 0846   WBC 10.6 (H) 08/27/2020 0935   RBC 4.89 08/10/2023 0846   RBC 4.13 (L) 08/27/2020 0935   HGB 14.0 08/10/2023 0846   HCT 43.9 08/10/2023 0846   PLT 294 08/10/2023 0846   MCV 90 08/10/2023 0846   MCH 28.6 08/10/2023 0846   MCH 29.3 08/27/2020 0935   MCHC 31.9 08/10/2023 0846   MCHC 34.1 08/27/2020 0935   RDW 13.1 08/10/2023 0846   LYMPHSABS 1.7 08/18/2020 0100   LYMPHSABS 2.2 06/04/2020 1440   MONOABS 1.0 08/18/2020 0100   EOSABS 0.4 08/18/2020 0100   EOSABS 1.2 (H) 06/04/2020 1440   BASOSABS 0.1 08/18/2020 0100   BASOSABS 0.1 06/04/2020 1440    ASSESSMENT AND PLAN: 1. Type 2 diabetes mellitus with obesity (HCC) (Primary) A1c improved but not at goal. Encourage healthy eating habits and trying to move as much as he can. Continue Trulicity 3 mg once a week, metformin 1 g twice a day.  Increase Glucotrol to 10 mg twice a day. - POCT glycosylated hemoglobin (Hb  A1C) - POCT glucose (manual entry) - Blood Glucose Monitoring Suppl (TRUE METRIX METER) w/Device KIT; Use to check blood sugar as instructed.  Dispense: 1 kit; Refill: 0 - Microalbumin / creatinine urine ratio - glipiZIDE (GLUCOTROL) 10 MG tablet; Take 1 tablet (10 mg total) by mouth 2 (two) times daily before a meal.  Dispense: 180 tablet; Refill: 1  2. Diabetes mellitus treated with oral medication (HCC) 3. Long-term (current) use of injectable non-insulin antidiabetic drugs See #1 above.  4. Hypertension associated with diabetes (HCC) Close to  goal. continue metoprolol 37.5 mg twice a day and Entresto 24/26 mg twice a day.   5. 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 Continue Entresto, metoprolol, Plavix, Zetia and aspirin   6. Aortic stenosis, moderate Still asymptomatic.  Again I strongly encouraged him to apply for Medicaid as he needs an updated echo.  7. Screening for colon cancer - Fecal occult blood, imunochemical([facility])  8. Need for vaccination against Streptococcus pneumoniae Given PCV 15 today  Patient was given the opportunity to ask questions.  Patient verbalized understanding of the plan and was able to repeat key elements of the plan.   This documentation was completed using Paediatric nurse.  Any transcriptional errors are unintentional.  Orders Placed This Encounter  Procedures   POCT glycosylated hemoglobin (Hb A1C)   POCT glucose (manual entry)     Requested Prescriptions   Pending Prescriptions Disp Refills   Blood Glucose Monitoring Suppl (TRUE METRIX METER) w/Device KIT 1 kit 0    Sig: Use to check blood sugar as instructed.    No follow-ups on file.  Jonah Blue, MD, FACP

## 2023-10-02 NOTE — Patient Instructions (Signed)
Increase glipizide to 10 mg twice a day.

## 2023-10-03 LAB — MICROALBUMIN / CREATININE URINE RATIO
Creatinine, Urine: 115.9 mg/dL
Microalb/Creat Ratio: 31 mg/g{creat} — ABNORMAL HIGH (ref 0–29)
Microalbumin, Urine: 35.7 ug/mL

## 2023-10-04 LAB — FECAL OCCULT BLOOD, IMMUNOCHEMICAL: Fecal Occult Bld: NEGATIVE

## 2023-10-08 ENCOUNTER — Other Ambulatory Visit: Payer: Self-pay

## 2023-10-13 ENCOUNTER — Other Ambulatory Visit: Payer: Self-pay

## 2023-10-21 ENCOUNTER — Other Ambulatory Visit: Payer: Self-pay | Admitting: Internal Medicine

## 2023-10-21 DIAGNOSIS — E669 Obesity, unspecified: Secondary | ICD-10-CM

## 2023-10-26 ENCOUNTER — Other Ambulatory Visit: Payer: Self-pay

## 2023-10-28 ENCOUNTER — Other Ambulatory Visit: Payer: Self-pay

## 2023-11-17 ENCOUNTER — Other Ambulatory Visit: Payer: Self-pay

## 2023-11-17 ENCOUNTER — Other Ambulatory Visit: Payer: Self-pay | Admitting: Internal Medicine

## 2023-11-17 DIAGNOSIS — I25810 Atherosclerosis of coronary artery bypass graft(s) without angina pectoris: Secondary | ICD-10-CM

## 2023-11-17 DIAGNOSIS — I10 Essential (primary) hypertension: Secondary | ICD-10-CM

## 2023-11-17 MED ORDER — ENTRESTO 24-26 MG PO TABS
1.0000 | ORAL_TABLET | Freq: Two times a day (BID) | ORAL | 6 refills | Status: DC
  Filled 2023-11-17 – 2024-03-01 (×3): qty 60, 30d supply, fill #0

## 2023-11-18 ENCOUNTER — Other Ambulatory Visit (HOSPITAL_COMMUNITY): Payer: Self-pay

## 2023-11-18 ENCOUNTER — Other Ambulatory Visit: Payer: Self-pay

## 2023-11-19 ENCOUNTER — Other Ambulatory Visit: Payer: Self-pay

## 2023-11-21 ENCOUNTER — Other Ambulatory Visit (HOSPITAL_BASED_OUTPATIENT_CLINIC_OR_DEPARTMENT_OTHER): Payer: Self-pay

## 2023-11-23 ENCOUNTER — Other Ambulatory Visit: Payer: Self-pay

## 2023-11-29 ENCOUNTER — Other Ambulatory Visit: Payer: Self-pay | Admitting: Family Medicine

## 2023-11-29 DIAGNOSIS — I25810 Atherosclerosis of coronary artery bypass graft(s) without angina pectoris: Secondary | ICD-10-CM

## 2023-11-29 DIAGNOSIS — E1169 Type 2 diabetes mellitus with other specified complication: Secondary | ICD-10-CM

## 2023-11-29 DIAGNOSIS — I152 Hypertension secondary to endocrine disorders: Secondary | ICD-10-CM

## 2023-11-30 ENCOUNTER — Other Ambulatory Visit: Payer: Self-pay

## 2023-12-01 ENCOUNTER — Other Ambulatory Visit: Payer: Self-pay

## 2023-12-01 MED ORDER — METFORMIN HCL 1000 MG PO TABS
1000.0000 mg | ORAL_TABLET | Freq: Two times a day (BID) | ORAL | 0 refills | Status: DC
  Filled 2023-12-01: qty 180, 90d supply, fill #0

## 2023-12-01 MED ORDER — METOPROLOL TARTRATE 25 MG PO TABS
37.5000 mg | ORAL_TABLET | Freq: Two times a day (BID) | ORAL | 0 refills | Status: DC
  Filled 2023-12-01: qty 270, 90d supply, fill #0

## 2023-12-01 MED ORDER — CLOPIDOGREL BISULFATE 75 MG PO TABS
75.0000 mg | ORAL_TABLET | Freq: Every day | ORAL | 0 refills | Status: DC
  Filled 2023-12-01: qty 90, 90d supply, fill #0

## 2023-12-03 ENCOUNTER — Other Ambulatory Visit: Payer: Self-pay

## 2023-12-07 ENCOUNTER — Other Ambulatory Visit: Payer: Self-pay

## 2024-01-28 ENCOUNTER — Other Ambulatory Visit: Payer: Self-pay

## 2024-01-28 ENCOUNTER — Ambulatory Visit: Payer: Self-pay | Admitting: Internal Medicine

## 2024-02-03 ENCOUNTER — Other Ambulatory Visit: Payer: Self-pay | Admitting: Internal Medicine

## 2024-02-03 ENCOUNTER — Other Ambulatory Visit: Payer: Self-pay

## 2024-02-03 ENCOUNTER — Other Ambulatory Visit: Payer: Self-pay | Admitting: Family Medicine

## 2024-02-03 DIAGNOSIS — I25810 Atherosclerosis of coronary artery bypass graft(s) without angina pectoris: Secondary | ICD-10-CM

## 2024-02-03 DIAGNOSIS — E1169 Type 2 diabetes mellitus with other specified complication: Secondary | ICD-10-CM

## 2024-02-03 DIAGNOSIS — E1159 Type 2 diabetes mellitus with other circulatory complications: Secondary | ICD-10-CM

## 2024-02-04 ENCOUNTER — Other Ambulatory Visit: Payer: Self-pay

## 2024-02-04 MED ORDER — ATORVASTATIN CALCIUM 80 MG PO TABS
80.0000 mg | ORAL_TABLET | Freq: Every day | ORAL | 1 refills | Status: DC
  Filled 2024-02-04 – 2024-03-01 (×2): qty 30, 30d supply, fill #0

## 2024-02-04 MED ORDER — CLOPIDOGREL BISULFATE 75 MG PO TABS
75.0000 mg | ORAL_TABLET | Freq: Every day | ORAL | 1 refills | Status: DC
  Filled 2024-02-04 – 2024-03-01 (×2): qty 30, 30d supply, fill #0
  Filled 2024-04-01 (×2): qty 30, 30d supply, fill #1

## 2024-02-04 MED ORDER — METOPROLOL TARTRATE 25 MG PO TABS
37.5000 mg | ORAL_TABLET | Freq: Two times a day (BID) | ORAL | 1 refills | Status: DC
  Filled 2024-02-04 – 2024-03-01 (×2): qty 90, 30d supply, fill #0
  Filled 2024-04-01 – 2024-04-11 (×2): qty 90, 30d supply, fill #1

## 2024-02-04 MED ORDER — EZETIMIBE 10 MG PO TABS
10.0000 mg | ORAL_TABLET | Freq: Every day | ORAL | 1 refills | Status: DC
Start: 2024-02-04 — End: 2024-05-05
  Filled 2024-02-04 – 2024-03-01 (×2): qty 30, 30d supply, fill #0
  Filled 2024-04-01 (×2): qty 30, 30d supply, fill #1

## 2024-02-04 MED ORDER — METFORMIN HCL 1000 MG PO TABS
1000.0000 mg | ORAL_TABLET | Freq: Two times a day (BID) | ORAL | 1 refills | Status: DC
  Filled 2024-02-04 – 2024-03-01 (×2): qty 60, 30d supply, fill #0

## 2024-02-09 ENCOUNTER — Other Ambulatory Visit: Payer: Self-pay

## 2024-02-22 ENCOUNTER — Other Ambulatory Visit: Payer: Self-pay

## 2024-02-29 ENCOUNTER — Other Ambulatory Visit: Payer: Self-pay

## 2024-03-01 ENCOUNTER — Other Ambulatory Visit: Payer: Self-pay

## 2024-03-07 ENCOUNTER — Other Ambulatory Visit: Payer: Self-pay

## 2024-03-08 ENCOUNTER — Other Ambulatory Visit: Payer: Self-pay

## 2024-03-09 ENCOUNTER — Other Ambulatory Visit: Payer: Self-pay | Admitting: Internal Medicine

## 2024-03-09 ENCOUNTER — Other Ambulatory Visit: Payer: Self-pay

## 2024-03-09 ENCOUNTER — Telehealth: Payer: Self-pay

## 2024-03-09 DIAGNOSIS — I10 Essential (primary) hypertension: Secondary | ICD-10-CM

## 2024-03-09 DIAGNOSIS — I25810 Atherosclerosis of coronary artery bypass graft(s) without angina pectoris: Secondary | ICD-10-CM

## 2024-03-09 MED ORDER — SACUBITRIL-VALSARTAN 24-26 MG PO TABS: 1.0000 | ORAL_TABLET | Freq: Two times a day (BID) | ORAL | 6 refills | Status: DC

## 2024-03-09 NOTE — Telephone Encounter (Signed)
 Patient provided 2024 tax return on 7/7 and requested this document be faxed to [employer] Patient Assistance Program for Entresto  assistance re-enrollment.   I called today, 7/9, to follow-up regarding enrollment/confirm faxed document was received but [employer] was not able to assist as I was unable to provide the current identifying information. I provided the [address] address that is current on patient's chart and address from last enrollment which did not [employer]' records. I left a voicemail with the number for [employer] for patient and advised that he call for a status update.

## 2024-03-21 ENCOUNTER — Other Ambulatory Visit: Payer: Self-pay

## 2024-03-25 ENCOUNTER — Telehealth: Payer: Self-pay | Admitting: Internal Medicine

## 2024-03-25 NOTE — Telephone Encounter (Signed)
 Confirmed appt for 7/28

## 2024-03-28 ENCOUNTER — Ambulatory Visit: Payer: Self-pay | Attending: Internal Medicine | Admitting: Internal Medicine

## 2024-03-28 ENCOUNTER — Other Ambulatory Visit: Payer: Self-pay

## 2024-03-28 ENCOUNTER — Encounter: Payer: Self-pay | Admitting: Internal Medicine

## 2024-03-28 VITALS — BP 116/72 | HR 79 | Temp 98.2°F | Ht 69.0 in | Wt 234.0 lb

## 2024-03-28 DIAGNOSIS — Z7984 Long term (current) use of oral hypoglycemic drugs: Secondary | ICD-10-CM

## 2024-03-28 DIAGNOSIS — E669 Obesity, unspecified: Secondary | ICD-10-CM

## 2024-03-28 DIAGNOSIS — Z7985 Long-term (current) use of injectable non-insulin antidiabetic drugs: Secondary | ICD-10-CM

## 2024-03-28 DIAGNOSIS — I152 Hypertension secondary to endocrine disorders: Secondary | ICD-10-CM

## 2024-03-28 DIAGNOSIS — I25810 Atherosclerosis of coronary artery bypass graft(s) without angina pectoris: Secondary | ICD-10-CM

## 2024-03-28 DIAGNOSIS — E119 Type 2 diabetes mellitus without complications: Secondary | ICD-10-CM

## 2024-03-28 DIAGNOSIS — E785 Hyperlipidemia, unspecified: Secondary | ICD-10-CM

## 2024-03-28 DIAGNOSIS — E1169 Type 2 diabetes mellitus with other specified complication: Secondary | ICD-10-CM

## 2024-03-28 DIAGNOSIS — E1159 Type 2 diabetes mellitus with other circulatory complications: Secondary | ICD-10-CM

## 2024-03-28 LAB — POCT GLYCOSYLATED HEMOGLOBIN (HGB A1C): HbA1c, POC (controlled diabetic range): 8.1 % — AB (ref 0.0–7.0)

## 2024-03-28 LAB — GLUCOSE, POCT (MANUAL RESULT ENTRY): POC Glucose: 158 mg/dL — AB (ref 70–99)

## 2024-03-28 MED ORDER — ATORVASTATIN CALCIUM 80 MG PO TABS
80.0000 mg | ORAL_TABLET | Freq: Every day | ORAL | 1 refills | Status: AC
  Filled 2024-03-28 – 2024-04-01 (×2): qty 90, 90d supply, fill #0
  Filled 2024-05-05 – 2024-08-05 (×4): qty 90, 90d supply, fill #1

## 2024-03-28 MED ORDER — METFORMIN HCL 1000 MG PO TABS
1000.0000 mg | ORAL_TABLET | Freq: Two times a day (BID) | ORAL | 1 refills | Status: DC
  Filled 2024-03-28 – 2024-04-01 (×2): qty 180, 90d supply, fill #0
  Filled 2024-05-05 – 2024-06-24 (×2): qty 180, 90d supply, fill #1

## 2024-03-28 MED ORDER — GLIPIZIDE 10 MG PO TABS
10.0000 mg | ORAL_TABLET | Freq: Two times a day (BID) | ORAL | 1 refills | Status: AC
  Filled 2024-03-28 – 2024-05-05 (×3): qty 180, 90d supply, fill #0
  Filled 2024-07-31: qty 180, 90d supply, fill #1

## 2024-03-28 MED ORDER — OZEMPIC (0.25 OR 0.5 MG/DOSE) 2 MG/3ML ~~LOC~~ SOPN
0.5000 mg | PEN_INJECTOR | SUBCUTANEOUS | 0 refills | Status: DC
  Filled 2024-03-28: qty 3, 28d supply, fill #0
  Filled 2024-03-28: qty 3, fill #0

## 2024-03-28 NOTE — Patient Instructions (Signed)
 VISIT SUMMARY:  Today, we reviewed your chronic conditions, including diabetes, hypertension, and heart disease. We discussed your current medications, blood sugar levels, and blood pressure readings. We also talked about your recent challenges with medication availability and your busy schedule caring for your mother.  YOUR PLAN:  -TYPE 2 DIABETES MELLITUS: Type 2 diabetes is a condition where your body does not use insulin  properly, leading to high blood sugar levels. Your A1c is currently at 8.1%, which indicates that your blood sugar is not well-controlled. We will switch you from Trulicity  to Ozempic , starting at 0.5 mg once weekly, to help improve your blood sugar control and assist with weight loss. Please continue your current diabetes medications until Ozempic  is available, and monitor your blood glucose levels regularly. Contact us  after one month on Ozempic  to discuss any necessary dose adjustments.  -HYPERTENSION: Hypertension, or high blood pressure, is a condition where the force of the blood against your artery walls is too high. Your blood pressure is currently well-controlled at 116/72 mmHg. You should restart Entresto  once it is available and monitor your blood pressure twice a week for two weeks after restarting. Continue with your current blood pressure medications.  -HEART DISEASE: Heart disease refers to various types of heart conditions, including issues with the heart's blood vessels, valves, or rhythm. You are not experiencing chest pain or shortness of breath, which is good. It is important to re-establish care with a cardiologist and consider a future echocardiogram to check for any valve issues.  -HYPERLIPIDEMIA: Hyperlipidemia is a condition where you have high levels of fats (lipids) in your blood, which can increase your risk of heart disease. You should continue taking atorvastatin  and Zetia  as prescribed.  -GENERAL HEALTH MAINTENANCE: You need to schedule an eye exam  and get new contacts. We discussed cost-effective options like [facility] or [facility]. Additionally, consider applying for Medicaid to help with healthcare costs. Managing stress from family and work is also important for your overall health.  INSTRUCTIONS:  Please monitor your blood glucose levels regularly and send us  a message after one month on Ozempic  to discuss dose adjustment. Restart Entresto  once available and monitor your blood pressure twice a week for two weeks. Schedule an eye exam at [facility] or [facility], and consider applying for Medicaid to assist with healthcare costs.

## 2024-03-28 NOTE — Progress Notes (Signed)
 Patient ID: Johnny Nichols, male    DOB: 1966-01-28  MRN: [id number]  CC: Diabetes (DM f/u. Med refills. /No questions / concerns)   Subjective: Johnny Nichols is a 58 y.o. male who presents for chronic ds management. His concerns today include:  DM, HTN, OSA, HL,obesity, CAD s/p CABGx4, PAF(occurred in setting of ACS.  Status post left atrial appendage clipping.  Not on anticoagulation), mod AS   Discussed the use of AI scribe software for clinical note transcription with the patient, who gave verbal consent to proceed.  History of Present Illness Johnny Nichols is a 58 year old male with diabetes, hypertension, and heart disease who presents for follow-up of his chronic conditions.  DM: Results for orders placed or performed in visit on 03/28/24  POCT glucose (manual entry)   Collection Time: 03/28/24  2:24 PM  Result Value Ref Range   POC Glucose 158 (A) 70 - 99 mg/dl  POCT glycosylated hemoglobin (Hb A1C)   Collection Time: 03/28/24  2:45 PM  Result Value Ref Range   Hemoglobin A1C     HbA1c POC (<> result, manual entry)     HbA1c, POC (prediabetic range)     HbA1c, POC (controlled diabetic range) 8.1 (A) 0.0 - 7.0 %    He manages diabetes with Trulicity  3 mg once a week, metformin  1 gram twice a day, and Glucotrol  (glipizide ) 10 mg twice a day. His A1c has remained at 8.1% since January. He experiences fluctuating eating habits, with periods of good control followed by increased intake, particularly in the heat. He attempts to avoid sugary snacks and drinks, opting for sugar-free beverages, but occasionally consumes ice cream and candies. Morning blood sugars range from 72 to 85, and evening levels range from 92 to 160. 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  HTN/CAD: He manages hypertension and heart disease with metoprolol  25 mg one and a  half tablets twice a day, atorvastatin  80 mg, Zetia  10 mg, clopidogrel  75 mg daily, and Entresto  24/26 mg twice a day. He has been out of Entresto  for about two weeks due to a delay in receiving his medication through PAP. During this period, he has been monitoring his blood pressure, which has been generally good, though he notes higher readings when feeling stressed or fatigued. His brother, a pharmacist, advised taking regular aspirin . He reports occasional fatigue and leg weakness, particularly in the heat. No chest pain or shortness of breath.  He frequently travels to [place] to assist his mother, who recently had a mini-stroke and is dealing with cellulitis. He has not yet applied for Medicaid due to his busy schedule. He also needs to have his eyes checked, as it has been a year since his last exam, and he requires new contacts.    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     Ascorbic Acid (VITAMIN C) 1000 MG tablet Take 1,000 mg by mouth daily.     aspirin  EC 81 MG EC tablet Take 1 tablet (81 mg total) by mouth daily.  Swallow whole. 30 tablet 11   Blood Glucose Monitoring Suppl (TRUE METRIX METER) w/Device KIT Use to check blood sugar as instructed. 1 kit 0   Cholecalciferol (VITAMIN D3 PO) Take 1 capsule by mouth daily.     clopidogrel  (PLAVIX ) 75 MG tablet TAKE 1 TABLET (75 MG TOTAL) BY MOUTH DAILY. (Must have office visit for refills) 30 tablet 1   ezetimibe  (ZETIA ) 10 MG tablet Take 1 tablet (10 mg total) by mouth daily. 30 tablet 1   glucose blood (TRUE METRIX BLOOD GLUCOSE TEST) test strip Use to check blood sugar 1 times daily. 100 each 6   metoprolol  tartrate (LOPRESSOR ) 25 MG tablet TAKE 1.5 TABLETS (37.5 MG TOTAL) BY MOUTH 2 (TWO) TIMES DAILY. 90 tablet 1   sacubitril -valsartan  (ENTRESTO ) 24-26 MG TAKE 1 TABLET BY MOUTH 2 (TWO) TIMES DAILY. 60 tablet 6   TRUEplus Lancets 28G MISC Use to check blood sugar 1 times daily. 100 each 6   No current facility-administered medications on file prior to visit.    No Known Allergies  Social History   Socioeconomic History   Marital status: Divorced    Spouse name: Not on file   Number of children: Not on file   Years of education: 16   Highest education level: Not on file  Occupational History   Occupation: works as Firefighter and a Engineer, technical sales  Tobacco Use   Smoking status: Never   Smokeless tobacco: Never  Vaping Use   Vaping status: Not on file  Substance and Sexual Activity   Alcohol  use: Yes    Alcohol /week: 1.0 standard drink of alcohol     Types: 1 Cans of beer per week    Comment: socially   Drug use: No   Sexual activity: Not on file  Other Topics Concern   Not on file  Social History Narrative   Not on file   Social Drivers of Health   Financial Resource Strain: Low Risk  (03/28/2024)   Overall Financial Resource Strain (CARDIA)    Difficulty of Paying Living Expenses: Not very hard  Food Insecurity: No Food Insecurity (03/28/2024)   Hunger Vital Sign    Worried About Running Out of Food in the Last Year: Never true    Ran Out of Food  in the Last Year: Never true  Transportation Needs: No Transportation Needs (03/28/2024)   PRAPARE - Administrator, Civil Service (Medical): No    Lack of Transportation (Non-Medical): No  Physical Activity: Sufficiently Active (03/28/2024)   Exercise Vital Sign    Days of Exercise per Week: 4 days    Minutes of Exercise per Session: 40 min  Stress: No Stress Concern Present (03/28/2024)   [employer] of Occupational Health - Occupational Stress Questionnaire    Feeling of Stress: Not at all  Social Connections: Moderately Isolated (03/28/2024)   Social Connection and Isolation Panel    Frequency of Communication with Friends and Family: Once a week    Frequency  of Social Gatherings with Friends and Family: Once a week    Attends Religious Services: 1 to 4 times per year    Active Member of [employer] or Organizations: Yes    Attends Banker Meetings: 1 to 4 times per year    Marital Status: Divorced  Intimate Partner Violence: Not At Risk (03/28/2024)   Humiliation, Afraid, Rape, and Kick questionnaire    Fear of Current or Ex-Partner: No    Emotionally Abused: No    Physically Abused: No    Sexually Abused: No    Family History  Problem Relation Age of Onset   Heart disease Mother    Cancer Father    Hypertension Other    Obesity Other     Past Surgical History:  Procedure Laterality Date   CARDIAC CATHETERIZATION     CLIPPING OF ATRIAL APPENDAGE N/A 08/22/2020   Procedure: CLIPPING OF ATRIAL APPENDAGE USING ATRICURE ATRICLIP;  Surgeon: Army Dallas NOVAK, MD;  Location: [facility] OR;  Service: Open Heart Surgery;  Laterality: N/A;   CORONARY ARTERY BYPASS GRAFT N/A 08/22/2020   Procedure: CORONARY ARTERY BYPASS GRAFTING (CABG) TIMES FOUR, USING LEFT INTERNAL MAMMARY ARTERY, LEFT RADIAL ARTERY, AND RIGHT LEG GREATER SAPHENOUS VEIN HARVESTED ENDOSCOPICALLY;  Surgeon: Army Dallas NOVAK, MD;  Location: [facility] OR;  Service: Open Heart Surgery;  Laterality: N/A;    ENDOVEIN HARVEST OF GREATER SAPHENOUS VEIN Right 08/22/2020   Procedure: ENDOVEIN HARVEST OF GREATER SAPHENOUS VEIN;  Surgeon: Army Dallas NOVAK, MD;  Location: [facility] OR;  Service: Open Heart Surgery;  Laterality: Right;   LEFT HEART CATH AND CORONARY ANGIOGRAPHY N/A 08/18/2020   Procedure: LEFT HEART CATH AND CORONARY ANGIOGRAPHY;  Surgeon: Dann Candyce RAMAN, MD;  Location: [facility] INVASIVE CV LAB;  Service: Cardiovascular;  Laterality: N/A;   RADIAL ARTERY HARVEST Left 08/22/2020   Procedure: LEFT RADIAL ARTERY HARVEST;  Surgeon: Army Dallas NOVAK, MD;  Location: [facility] OR;  Service: Open Heart Surgery;  Laterality: Left;   TEE WITHOUT CARDIOVERSION N/A 08/22/2020   Procedure: TRANSESOPHAGEAL ECHOCARDIOGRAM (TEE);  Surgeon: Army Dallas NOVAK, MD;  Location: [facility] OR;  Service: Open Heart Surgery;  Laterality: N/A;    ROS: Review of Systems Negative except as stated above  PHYSICAL EXAM: BP 116/72 (BP Location: Left Arm, Patient Position: Sitting, Cuff Size: Large)   Pulse 79   Temp 98.2 F (36.8 C) (Oral)   Ht 5' 9 (1.753 m)   Wt 234 lb (106.1 kg)   SpO2 98%   BMI 34.56 kg/m   Wt Readings from Last 3 Encounters:  03/28/24 234 lb (106.1 kg)  10/02/23 233 lb (105.7 kg)  07/17/23 230 lb (104.3 kg)  Repeat blood pressure 130/86  Physical Exam General appearance - alert, well appearing, older middle age Caucasian male and in no distress Mental status - normal mood, behavior, speech, dress, motor activity, and thought processes Chest - clear to auscultation, no wheezes, rales or rhonchi, symmetric air entry Heart - normal rate, regular rhythm, 2/6 SEM LUSB Extremities - peripheral pulses normal, no pedal edema, no clubbing or cyanosis Diabetic Foot Exam - Simple   Simple Foot Form Diabetic Foot exam was performed with the following findings: Yes 03/28/2024  3:05 PM  Visual Inspection No deformities, no ulcerations, no other skin breakdown bilaterally: Yes Sensation Testing Intact to  touch and monofilament testing bilaterally: Yes Pulse Check Posterior Tibialis and Dorsalis pulse intact bilaterally: Yes Comments Toenails are cut too short.        Latest  Ref Rng & Units 08/10/2023    8:46 AM 03/11/2022    4:53 PM 01/03/2021    9:12 AM  CMP  Glucose 70 - 99 mg/dL 843  893  93   BUN 6 - 24 mg/dL 11  13  14    Creatinine 0.76 - 1.27 mg/dL 9.05  9.12  8.81   Sodium 134 - 144 mmol/L 141  140  141   Potassium 3.5 - 5.2 mmol/L 4.7  4.4  4.6   Chloride 96 - 106 mmol/L 105  103  102   CO2 20 - 29 mmol/L 22  23  24    Calcium  8.7 - 10.2 mg/dL 9.0  89.9  9.4   Total Protein 6.0 - 8.5 g/dL 7.1  7.4  7.4   Total Bilirubin 0.0 - 1.2 mg/dL 0.6  0.5  0.3   Alkaline Phos 44 - 121 IU/L 76  66  74   AST 0 - 40 IU/L 16  15  19    ALT 0 - 44 IU/L 23  19  21     Lipid Panel     Component Value Date/Time   CHOL 112 08/10/2023 0846   TRIG 51 08/10/2023 0846   HDL 38 (L) 08/10/2023 0846   CHOLHDL 2.9 08/10/2023 0846   CHOLHDL 4.1 08/18/2020 0631   VLDL 13 08/18/2020 0631   LDLCALC 62 08/10/2023 0846    CBC    Component Value Date/Time   WBC 11.2 (H) 08/10/2023 0846   WBC 10.6 (H) 08/27/2020 0935   RBC 4.89 08/10/2023 0846   RBC 4.13 (L) 08/27/2020 0935   HGB 14.0 08/10/2023 0846   HCT 43.9 08/10/2023 0846   PLT 294 08/10/2023 0846   MCV 90 08/10/2023 0846   MCH 28.6 08/10/2023 0846   MCH 29.3 08/27/2020 0935   MCHC 31.9 08/10/2023 0846   MCHC 34.1 08/27/2020 0935   RDW 13.1 08/10/2023 0846   LYMPHSABS 1.7 08/18/2020 0100   LYMPHSABS 2.2 06/04/2020 1440   MONOABS 1.0 08/18/2020 0100   EOSABS 0.4 08/18/2020 0100   EOSABS 1.2 (H) 06/04/2020 1440   BASOSABS 0.1 08/18/2020 0100   BASOSABS 0.1 06/04/2020 1440    ASSESSMENT AND PLAN: 1. Type 2 diabetes mellitus with obesity (HCC) (Primary) Not at goal. Discussed and encouraged healthy eating habits. Discussed changing from Trulicity  to Ozempic  for better lowering of A1c and weight loss.  He is willing to make the  switch.  We we will start him on the 0.5 mg dose of the Ozempic .  I went over with pt how the medication works and potential side effects including nausea, vomiting, diarrhea/constipation, bowel blockage, palpitations and pancreatitis.  Advised to stop the medicine and be seen if pt develops any abdominal pain, vomiting, severe diarrhea/constipation or palpitations. - POCT glycosylated hemoglobin (Hb A1C) - POCT glucose (manual entry) - glipiZIDE  (GLUCOTROL ) 10 MG tablet; Take 1 tablet (10 mg total) by mouth 2 (two) times daily before a meal.  Dispense: 180 tablet; Refill: 1 - metFORMIN  (GLUCOPHAGE ) 1000 MG tablet; Take 1 tablet (1,000 mg total) by mouth 2 (two) times daily with a meal.  Dispense: 180 tablet; Refill: 1 - Semaglutide ,0.25 or 0.5MG /DOS, (OZEMPIC , 0.25 OR 0.5 MG/DOSE,) 2 MG/3ML SOPN; Inject 0.5 mg into the skin once a week.  Dispense: 3 mL; Refill: 0  2. Long-term (current) use of injectable non-insulin  antidiabetic drugs 3. Diabetes mellitus treated with oral medication (HCC) See #1 above  4. 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  Continue atorvastatin , Zetia , metoprolol , Plavix  and aspirin . - atorvastatin  (LIPITOR ) 80 MG tablet; Take 1 tablet (80 mg total) by mouth daily.  Dispense: 90 tablet; Refill: 1  5. Hyperlipidemia associated with type 2 diabetes mellitus (HCC) - atorvastatin  (LIPITOR ) 80 MG tablet; Take 1 tablet (80 mg total) by mouth daily.  Dispense: 90 tablet; Refill: 1  5.  Hypertension associated with diabetes type 2 At goal on initial check. continue metoprolol  37.5 mg twice a day and Entresto  24/26 mg twice a day.  Advised to monitor blood pressure once he gets the Entresto  to make sure that blood pressure is not dropping too low Assessment and Plan   Patient was given the opportunity to ask questions.  Patient verbalized understanding of the plan and was able to repeat key elements of the plan.   This documentation was completed using  Paediatric nurse.  Any transcriptional errors are unintentional.  Orders Placed This Encounter  Procedures   POCT glycosylated hemoglobin (Hb A1C)   POCT glucose (manual entry)     Requested Prescriptions   Signed Prescriptions Disp Refills   glipiZIDE  (GLUCOTROL ) 10 MG tablet 180 tablet 1    Sig: Take 1 tablet (10 mg total) by mouth 2 (two) times daily before a meal.   metFORMIN  (GLUCOPHAGE ) 1000 MG tablet 180 tablet 1    Sig: Take 1 tablet (1,000 mg total) by mouth 2 (two) times daily with a meal.   atorvastatin  (LIPITOR ) 80 MG tablet 90 tablet 1    Sig: Take 1 tablet (80 mg total) by mouth daily.   Semaglutide ,0.25 or 0.5MG /DOS, (OZEMPIC , 0.25 OR 0.5 MG/DOSE,) 2 MG/3ML SOPN 3 mL 0    Sig: Inject 0.5 mg into the skin once a week.    Return in about 4 months (around 07/29/2024).  Barnie Louder, MD, FACP

## 2024-03-29 ENCOUNTER — Other Ambulatory Visit: Payer: Self-pay

## 2024-04-01 ENCOUNTER — Other Ambulatory Visit: Payer: Self-pay

## 2024-04-04 ENCOUNTER — Other Ambulatory Visit: Payer: Self-pay

## 2024-04-05 ENCOUNTER — Telehealth: Payer: Self-pay

## 2024-04-05 ENCOUNTER — Other Ambulatory Visit: Payer: Self-pay

## 2024-04-05 NOTE — Telephone Encounter (Signed)
 Received notification from [employer] regarding approval for ENTRESTO  24/26. Patient assistance approved from 04/04/2024 to 04/04/2025.  Medication will ship to [id number]  Pt ID: [id number]  Company phone: [phone]

## 2024-04-11 ENCOUNTER — Other Ambulatory Visit: Payer: Self-pay

## 2024-04-12 ENCOUNTER — Other Ambulatory Visit: Payer: Self-pay

## 2024-04-25 ENCOUNTER — Other Ambulatory Visit: Payer: Self-pay

## 2024-04-25 ENCOUNTER — Other Ambulatory Visit: Payer: Self-pay | Admitting: Internal Medicine

## 2024-04-25 ENCOUNTER — Other Ambulatory Visit (HOSPITAL_COMMUNITY): Payer: Self-pay

## 2024-04-25 DIAGNOSIS — E669 Obesity, unspecified: Secondary | ICD-10-CM

## 2024-04-26 ENCOUNTER — Other Ambulatory Visit: Payer: Self-pay

## 2024-04-26 MED ORDER — OZEMPIC (0.25 OR 0.5 MG/DOSE) 2 MG/3ML ~~LOC~~ SOPN
0.5000 mg | PEN_INJECTOR | SUBCUTANEOUS | 2 refills | Status: DC
  Filled 2024-04-26: qty 3, 28d supply, fill #0
  Filled 2024-05-05 – 2024-06-01 (×4): qty 3, 28d supply, fill #1
  Filled 2024-06-24: qty 3, 28d supply, fill #2

## 2024-04-26 NOTE — Telephone Encounter (Signed)
 Requested Prescriptions  Pending Prescriptions Disp Refills   Semaglutide ,0.25 or 0.5MG /DOS, (OZEMPIC , 0.25 OR 0.5 MG/DOSE,) 2 MG/3ML SOPN 3 mL 2    Sig: Inject 0.5 mg into the skin once a week.     Endocrinology:  Diabetes - GLP-1 Receptor Agonists - semaglutide  Failed - 04/26/2024  5:44 PM      Failed - HBA1C in normal range and within 180 days    HbA1c, POC (prediabetic range)  Date Value Ref Range Status  03/11/2022 6.4 5.7 - 6.4 % Final   HbA1c, POC (controlled diabetic range)  Date Value Ref Range Status  03/28/2024 8.1 (A) 0.0 - 7.0 % Final         Passed - Cr in normal range and within 360 days    Creat  Date Value Ref Range Status  10/11/2015 0.95 0.60 - 1.35 mg/dL Final   Creatinine, Ser  Date Value Ref Range Status  08/10/2023 0.94 0.76 - 1.27 mg/dL Final   Creatinine, Urine  Date Value Ref Range Status  10/11/2015 198 20 - 370 mg/dL Final         Passed - Valid encounter within last 6 months    Recent Outpatient Visits           4 weeks ago Type 2 diabetes mellitus with obesity (HCC)   [place] [facility] [place]. [facility] Vicci Sober B, MD   6 months ago Type 2 diabetes mellitus with obesity [facility])   [place] [facility] - [facility] Dept Of [place]. [facility] Vicci Sober NOVAK, MD   1 year ago Type 2 diabetes mellitus with obesity [facility])   [place] [facility] - A Dept Of [place]. [facility] Vicci Sober NOVAK, MD   1 year ago Type 2 diabetes mellitus with obesity [facility])   [place] [facility] - A Dept Of [place]. [facility] Vicci Sober B, MD   2 years ago Type 2 diabetes mellitus with obesity [facility])   [place] [facility] - A Dept Of [place]. [facility] Vicci Sober NOVAK, MD       Future Appointments             In 3 months Vicci Sober NOVAK, MD [facility] - A Dept Of [place]. [facility]

## 2024-04-27 ENCOUNTER — Other Ambulatory Visit: Payer: Self-pay

## 2024-05-05 ENCOUNTER — Other Ambulatory Visit: Payer: Self-pay | Admitting: Family Medicine

## 2024-05-05 ENCOUNTER — Other Ambulatory Visit: Payer: Self-pay | Admitting: Internal Medicine

## 2024-05-05 DIAGNOSIS — I25810 Atherosclerosis of coronary artery bypass graft(s) without angina pectoris: Secondary | ICD-10-CM

## 2024-05-05 DIAGNOSIS — E1159 Type 2 diabetes mellitus with other circulatory complications: Secondary | ICD-10-CM

## 2024-05-06 ENCOUNTER — Other Ambulatory Visit: Payer: Self-pay

## 2024-05-06 MED ORDER — EZETIMIBE 10 MG PO TABS
10.0000 mg | ORAL_TABLET | Freq: Every day | ORAL | 1 refills | Status: AC
  Filled 2024-05-06: qty 90, 90d supply, fill #0
  Filled 2024-07-31: qty 90, 90d supply, fill #1

## 2024-05-06 MED ORDER — CLOPIDOGREL BISULFATE 75 MG PO TABS
75.0000 mg | ORAL_TABLET | Freq: Every day | ORAL | 0 refills | Status: DC
  Filled 2024-05-06: qty 90, 90d supply, fill #0

## 2024-05-06 MED ORDER — METOPROLOL TARTRATE 25 MG PO TABS
37.5000 mg | ORAL_TABLET | Freq: Two times a day (BID) | ORAL | 1 refills | Status: DC
  Filled 2024-05-06: qty 90, 30d supply, fill #0
  Filled 2024-06-24: qty 90, 30d supply, fill #1

## 2024-05-06 NOTE — Telephone Encounter (Signed)
 Requested Prescriptions  Pending Prescriptions Disp Refills   ezetimibe  (ZETIA ) 10 MG tablet 90 tablet 1    Sig: Take 1 tablet (10 mg total) by mouth daily.     Cardiovascular:  Antilipid - Sterol Transport Inhibitors Failed - 05/06/2024  9:25 AM      Failed - Lipid Panel in normal range within the last 12 months    Cholesterol, Total  Date Value Ref Range Status  08/10/2023 112 100 - 199 mg/dL Final   LDL Chol Calc (NIH)  Date Value Ref Range Status  08/10/2023 62 0 - 99 mg/dL Final   HDL  Date Value Ref Range Status  08/10/2023 38 (L) >39 mg/dL Final   Triglycerides  Date Value Ref Range Status  08/10/2023 51 0 - 149 mg/dL Final         Passed - AST in normal range and within 360 days    AST  Date Value Ref Range Status  08/10/2023 16 0 - 40 IU/L Final         Passed - ALT in normal range and within 360 days    ALT  Date Value Ref Range Status  08/10/2023 23 0 - 44 IU/L Final         Passed - Patient is not pregnant      Passed - Valid encounter within last 12 months    Recent Outpatient Visits           1 month ago Type 2 diabetes mellitus with obesity (HCC)   [place] [facility] Dept Of [place]. [facility] Vicci Sober B, MD   7 months ago Type 2 diabetes mellitus with obesity [facility])   [place] [facility] - A Dept Of [place]. [facility] Vicci Sober NOVAK, MD   1 year ago Type 2 diabetes mellitus with obesity [facility])   [place] [facility] - A Dept Of [place]. [facility] Vicci Sober NOVAK, MD   1 year ago Type 2 diabetes mellitus with obesity [facility])   [place] [facility] - A Dept Of [place]. [facility] Vicci Sober B, MD   2 years ago Type 2 diabetes mellitus with obesity [facility])   [place] [facility] - A Dept Of [place]. [facility] Vicci Sober NOVAK, MD       Future Appointments             In 2 months Vicci Sober NOVAK, MD [facility]  [facility] [place] - A Dept Of [place]. [facility] [place]

## 2024-05-06 NOTE — Telephone Encounter (Signed)
 Requested Prescriptions  Pending Prescriptions Disp Refills   clopidogrel  (PLAVIX ) 75 MG tablet 90 tablet 1    Sig: TAKE 1 TABLET (75 MG TOTAL) BY MOUTH DAILY. (Must have office visit for refills)     Hematology: Antiplatelets - clopidogrel  Failed - 05/06/2024  9:32 AM      Failed - HCT in normal range and within 180 days    Hematocrit  Date Value Ref Range Status  08/10/2023 43.9 37.5 - 51.0 % Final         Failed - HGB in normal range and within 180 days    Hemoglobin  Date Value Ref Range Status  08/10/2023 14.0 13.0 - 17.7 g/dL Final   Total hemoglobin  Date Value Ref Range Status  08/25/2020 11.3 (L) 12.0 - 16.0 g/dL Final         Failed - PLT in normal range and within 180 days    Platelets  Date Value Ref Range Status  08/10/2023 294 150 - 450 x10E3/uL Final         Passed - Cr in normal range and within 360 days    Creat  Date Value Ref Range Status  10/11/2015 0.95 0.60 - 1.35 mg/dL Final   Creatinine, Ser  Date Value Ref Range Status  08/10/2023 0.94 0.76 - 1.27 mg/dL Final   Creatinine, Urine  Date Value Ref Range Status  10/11/2015 198 20 - 370 mg/dL Final         Passed - Valid encounter within last 6 months    Recent Outpatient Visits           1 month ago Type 2 diabetes mellitus with obesity (HCC)   [place] [facility] Dept Of [place]. [facility] Vicci Sober B, MD   7 months ago Type 2 diabetes mellitus with obesity [facility])   [place] [facility] - A Dept Of [place]. [facility] Vicci Sober NOVAK, MD   1 year ago Type 2 diabetes mellitus with obesity [facility])   [place] [facility] - A Dept Of [place]. [facility] Vicci Sober NOVAK, MD   1 year ago Type 2 diabetes mellitus with obesity [facility])   [place] [facility] - A Dept Of [place]. [facility] Vicci Sober B, MD   2 years ago Type 2 diabetes mellitus with obesity [facility])   [place] [facility] - A Dept Of [place]. [facility] Vicci Sober NOVAK, MD       Future Appointments             In 2 months Vicci Sober NOVAK, MD [facility] [place] - A Dept Of [place]. [facility] [address]             metoprolol  tartrate (LOPRESSOR ) 25 MG tablet 90 tablet 1    Sig: TAKE 1.5 TABLETS (37.5 MG TOTAL) BY MOUTH 2 (TWO) TIMES DAILY.     Cardiovascular:  Beta Blockers Passed - 05/06/2024  9:32 AM      Passed - Last BP in normal range    BP Readings from Last 1 Encounters:  03/28/24 116/72         Passed - Last Heart Rate in normal range    Pulse Readings from Last 1 Encounters:  03/28/24 79         Passed - Valid encounter within last 6 months  Recent Outpatient Visits           1 month ago Type 2 diabetes mellitus with obesity (HCC)   [place] [facility] Dept Of [place]. [facility] Vicci Sober B, MD   7 months ago Type 2 diabetes mellitus with obesity [facility])   [place] [facility] - A Dept Of [place]. [facility] Vicci Sober NOVAK, MD   1 year ago Type 2 diabetes mellitus with obesity [facility])   [place] [facility] - A Dept Of [place]. [facility] Vicci Sober NOVAK, MD   1 year ago Type 2 diabetes mellitus with obesity [facility])   [place] [facility] - A Dept Of [place]. [facility] Vicci Sober B, MD   2 years ago Type 2 diabetes mellitus with obesity [facility])   [place] [facility] - A Dept Of [place]. [facility] Vicci Sober NOVAK, MD       Future Appointments             In 2 months Vicci Sober NOVAK, MD [facility] [place] - A Dept Of [place]. [facility] [place]

## 2024-06-01 ENCOUNTER — Other Ambulatory Visit: Payer: Self-pay

## 2024-06-17 ENCOUNTER — Other Ambulatory Visit: Payer: Self-pay

## 2024-06-20 ENCOUNTER — Other Ambulatory Visit: Payer: Self-pay

## 2024-06-24 ENCOUNTER — Other Ambulatory Visit: Payer: Self-pay

## 2024-06-27 ENCOUNTER — Other Ambulatory Visit: Payer: Self-pay

## 2024-07-18 ENCOUNTER — Other Ambulatory Visit: Payer: Self-pay

## 2024-07-26 ENCOUNTER — Other Ambulatory Visit: Payer: Self-pay | Admitting: Internal Medicine

## 2024-07-26 ENCOUNTER — Other Ambulatory Visit: Payer: Self-pay

## 2024-07-26 DIAGNOSIS — I25810 Atherosclerosis of coronary artery bypass graft(s) without angina pectoris: Secondary | ICD-10-CM

## 2024-07-26 DIAGNOSIS — I152 Hypertension secondary to endocrine disorders: Secondary | ICD-10-CM

## 2024-07-26 DIAGNOSIS — E119 Type 2 diabetes mellitus without complications: Secondary | ICD-10-CM

## 2024-07-26 MED ORDER — METOPROLOL TARTRATE 25 MG PO TABS
37.5000 mg | ORAL_TABLET | Freq: Two times a day (BID) | ORAL | 0 refills | Status: DC
  Filled 2024-07-26: qty 90, 30d supply, fill #0

## 2024-07-26 MED ORDER — OZEMPIC (0.25 OR 0.5 MG/DOSE) 2 MG/3ML ~~LOC~~ SOPN
0.5000 mg | PEN_INJECTOR | SUBCUTANEOUS | 0 refills | Status: DC
  Filled 2024-07-26: qty 3, 28d supply, fill #0

## 2024-07-27 ENCOUNTER — Other Ambulatory Visit: Payer: Self-pay

## 2024-07-31 ENCOUNTER — Other Ambulatory Visit: Payer: Self-pay | Admitting: Internal Medicine

## 2024-07-31 DIAGNOSIS — I25810 Atherosclerosis of coronary artery bypass graft(s) without angina pectoris: Secondary | ICD-10-CM

## 2024-07-31 DIAGNOSIS — E119 Type 2 diabetes mellitus without complications: Secondary | ICD-10-CM

## 2024-08-01 ENCOUNTER — Ambulatory Visit: Payer: Self-pay | Admitting: Internal Medicine

## 2024-08-01 ENCOUNTER — Other Ambulatory Visit: Payer: Self-pay

## 2024-08-01 MED ORDER — CLOPIDOGREL BISULFATE 75 MG PO TABS
75.0000 mg | ORAL_TABLET | Freq: Every day | ORAL | 0 refills | Status: AC
  Filled 2024-08-01: qty 90, 90d supply, fill #0

## 2024-08-01 MED ORDER — METFORMIN HCL 1000 MG PO TABS
1000.0000 mg | ORAL_TABLET | Freq: Two times a day (BID) | ORAL | 0 refills | Status: AC
  Filled 2024-08-01: qty 180, 90d supply, fill #0

## 2024-08-02 ENCOUNTER — Other Ambulatory Visit: Payer: Self-pay

## 2024-08-05 ENCOUNTER — Other Ambulatory Visit: Payer: Self-pay

## 2024-08-11 ENCOUNTER — Other Ambulatory Visit: Payer: Self-pay

## 2024-08-29 ENCOUNTER — Other Ambulatory Visit: Payer: Self-pay

## 2024-08-29 ENCOUNTER — Other Ambulatory Visit: Payer: Self-pay | Admitting: Internal Medicine

## 2024-08-29 DIAGNOSIS — I25810 Atherosclerosis of coronary artery bypass graft(s) without angina pectoris: Secondary | ICD-10-CM

## 2024-08-29 DIAGNOSIS — E669 Obesity, unspecified: Secondary | ICD-10-CM

## 2024-08-29 DIAGNOSIS — I152 Hypertension secondary to endocrine disorders: Secondary | ICD-10-CM

## 2024-08-29 MED ORDER — OZEMPIC (0.25 OR 0.5 MG/DOSE) 2 MG/3ML ~~LOC~~ SOPN
0.5000 mg | PEN_INJECTOR | SUBCUTANEOUS | 0 refills | Status: DC
  Filled 2024-08-29: qty 3, 28d supply, fill #0

## 2024-08-29 MED ORDER — METOPROLOL TARTRATE 25 MG PO TABS
37.5000 mg | ORAL_TABLET | Freq: Two times a day (BID) | ORAL | 0 refills | Status: DC
  Filled 2024-08-29: qty 90, 30d supply, fill #0

## 2024-09-02 ENCOUNTER — Other Ambulatory Visit: Payer: Self-pay

## 2024-09-05 ENCOUNTER — Other Ambulatory Visit: Payer: Self-pay

## 2024-09-05 DIAGNOSIS — E119 Type 2 diabetes mellitus without complications: Secondary | ICD-10-CM

## 2024-09-06 NOTE — Telephone Encounter (Signed)
 Rx 08/29/24 3ml- duplicate request Requested Prescriptions  Pending Prescriptions Disp Refills   Semaglutide ,0.25 or 0.5MG /DOS, (OZEMPIC , 0.25 OR 0.5 MG/DOSE,) 2 MG/3ML SOPN 3 mL 0    Sig: Inject 0.5 mg into the skin once a week.     Endocrinology:  Diabetes - GLP-1 Receptor Agonists - semaglutide  Failed - 09/06/2024  3:54 PM      Failed - HBA1C in normal range and within 180 days    HbA1c, POC (prediabetic range)  Date Value Ref Range Status  03/11/2022 6.4 5.7 - 6.4 % Final   HbA1c, POC (controlled diabetic range)  Date Value Ref Range Status  03/28/2024 8.1 (A) 0.0 - 7.0 % Final         Failed - Cr in normal range and within 360 days    Creat  Date Value Ref Range Status  10/11/2015 0.95 0.60 - 1.35 mg/dL Final   Creatinine, Ser  Date Value Ref Range Status  08/10/2023 0.94 0.76 - 1.27 mg/dL Final   Creatinine, Urine  Date Value Ref Range Status  10/11/2015 198 20 - 370 mg/dL Final         Passed - Valid encounter within last 6 months    Recent Outpatient Visits           5 months ago Type 2 diabetes mellitus with obesity   [facility] [place] Sober B, MD   11 months ago Type 2 diabetes mellitus with obesity   [facility] - [facility] Dept Of [place]. [facility] Vicci Sober NOVAK, MD   1 year ago Type 2 diabetes mellitus with obesity   [facility] - [facility] Dept Of [place]. [facility] Vic[facility] Sober B, MD   2 years ago Type 2 diabetes mellitus with obesity   [facility] - A Dept Of [place]. [facility] Vicci Sober B, MD   2 years ago Type 2 diabetes mellitus with obesity   [place] [facility] - [facility] Dept Of [place]. [facility] Vicci Sober NOVAK, MD

## 2024-09-12 ENCOUNTER — Other Ambulatory Visit: Payer: Self-pay

## 2024-09-24 ENCOUNTER — Other Ambulatory Visit: Payer: Self-pay | Admitting: Internal Medicine

## 2024-09-24 DIAGNOSIS — I25810 Atherosclerosis of coronary artery bypass graft(s) without angina pectoris: Secondary | ICD-10-CM

## 2024-09-24 DIAGNOSIS — E1159 Type 2 diabetes mellitus with other circulatory complications: Secondary | ICD-10-CM

## 2024-09-24 DIAGNOSIS — E669 Obesity, unspecified: Secondary | ICD-10-CM

## 2024-09-25 MED ORDER — OZEMPIC (0.25 OR 0.5 MG/DOSE) 2 MG/3ML ~~LOC~~ SOPN
0.5000 mg | PEN_INJECTOR | SUBCUTANEOUS | 0 refills | Status: AC
  Filled 2024-09-25: qty 3, 28d supply, fill #0

## 2024-09-25 MED ORDER — METOPROLOL TARTRATE 25 MG PO TABS
37.5000 mg | ORAL_TABLET | Freq: Two times a day (BID) | ORAL | 0 refills | Status: AC
  Filled 2024-09-25: qty 90, 30d supply, fill #0

## 2024-09-26 ENCOUNTER — Other Ambulatory Visit: Payer: Self-pay

## 2024-09-27 ENCOUNTER — Telehealth: Payer: Self-pay | Admitting: Internal Medicine

## 2024-09-27 ENCOUNTER — Other Ambulatory Visit: Payer: Self-pay

## 2024-09-27 ENCOUNTER — Ambulatory Visit: Payer: Self-pay | Admitting: Internal Medicine

## 2024-09-27 DIAGNOSIS — I10 Essential (primary) hypertension: Secondary | ICD-10-CM

## 2024-09-27 DIAGNOSIS — I25810 Atherosclerosis of coronary artery bypass graft(s) without angina pectoris: Secondary | ICD-10-CM

## 2024-09-27 NOTE — Telephone Encounter (Signed)
 Copied from CRM [id number]. Topic: Clinical - Medication Question >> Sep 27, 2024 11:11 AM [name] wrote: Reason for CRM: Pt calling, in reference to medication, sacubitril -valsartan  (ENTRESTO ) 24-26 MG.  Reports unable to afford medication, and per current rx plan, pt assistance program is not supported for this medication.  Pt inquiring if there is any other medication he can take.   Pt requesting a follow up call to discuss further.  Can be reached at [phone].  Aware of follow up call.

## 2024-09-27 NOTE — Telephone Encounter (Signed)
 Routing to PCP for review.

## 2024-09-28 ENCOUNTER — Other Ambulatory Visit: Payer: Self-pay

## 2024-09-28 MED ORDER — VALSARTAN 40 MG PO TABS
40.0000 mg | ORAL_TABLET | Freq: Every day | ORAL | 1 refills | Status: AC
  Filled 2024-09-28: qty 30, 30d supply, fill #0

## 2024-09-28 NOTE — Telephone Encounter (Signed)
 Patient has seen results & provider message via Mychart.

## 2024-09-28 NOTE — Telephone Encounter (Signed)
 If you are no longer able to get the Entresto  through Patient Assistance Program, then we can change to a medication called valsartan  40 mg daily.  Prescription has been sent to our pharmacy.  Please keep upcoming appointment next month as you are due for blood tests including kidney and liver function blood tests.  I have sent pt this message via Mychart as well.

## 2024-10-27 ENCOUNTER — Ambulatory Visit: Payer: Self-pay | Admitting: Internal Medicine
